# Patient Record
Sex: Male | Born: 1975 | Race: Black or African American | Hispanic: No | Marital: Single | Smoking: Former smoker
Health system: Southern US, Community
[De-identification: ages and names within clinical notes are randomized; demographics above are authoritative.]

## PROBLEM LIST (undated history)

## (undated) DIAGNOSIS — M62838 Other muscle spasm: Secondary | ICD-10-CM

## (undated) DIAGNOSIS — E559 Vitamin D deficiency, unspecified: Secondary | ICD-10-CM

## (undated) DIAGNOSIS — D1723 Benign lipomatous neoplasm of skin and subcutaneous tissue of right leg: Secondary | ICD-10-CM

## (undated) DIAGNOSIS — R000 Tachycardia, unspecified: Secondary | ICD-10-CM

## (undated) DIAGNOSIS — A048 Other specified bacterial intestinal infections: Secondary | ICD-10-CM

## (undated) DIAGNOSIS — D1724 Benign lipomatous neoplasm of skin and subcutaneous tissue of left leg: Secondary | ICD-10-CM

## (undated) DIAGNOSIS — K8020 Calculus of gallbladder without cholecystitis without obstruction: Secondary | ICD-10-CM

## (undated) DIAGNOSIS — D72829 Elevated white blood cell count, unspecified: Secondary | ICD-10-CM

## (undated) DIAGNOSIS — I890 Lymphedema, not elsewhere classified: Secondary | ICD-10-CM

## (undated) DIAGNOSIS — M6282 Rhabdomyolysis: Secondary | ICD-10-CM

## (undated) DIAGNOSIS — K219 Gastro-esophageal reflux disease without esophagitis: Secondary | ICD-10-CM

## (undated) DIAGNOSIS — R002 Palpitations: Secondary | ICD-10-CM

## (undated) DIAGNOSIS — N179 Acute kidney failure, unspecified: Secondary | ICD-10-CM

## (undated) DIAGNOSIS — Z8619 Personal history of other infectious and parasitic diseases: Secondary | ICD-10-CM

## (undated) DIAGNOSIS — G4730 Sleep apnea, unspecified: Secondary | ICD-10-CM

## (undated) DIAGNOSIS — Z95828 Presence of other vascular implants and grafts: Secondary | ICD-10-CM

## (undated) HISTORY — DX: Elevated white blood cell count, unspecified: D72.829

## (undated) HISTORY — DX: Rhabdomyolysis: M62.82

## (undated) HISTORY — DX: Other muscle spasm: M62.838

## (undated) HISTORY — DX: Gastro-esophageal reflux disease without esophagitis: K21.9

## (undated) HISTORY — DX: Lymphedema, not elsewhere classified: I89.0

## (undated) HISTORY — DX: Other specified bacterial intestinal infections: A04.8

## (undated) HISTORY — DX: Acute kidney failure, unspecified: N17.9

## (undated) HISTORY — DX: Benign lipomatous neoplasm of skin and subcutaneous tissue of left leg: D17.24

## (undated) HISTORY — DX: Calculus of gallbladder without cholecystitis without obstruction: K80.20

## (undated) HISTORY — DX: Palpitations: R00.2

## (undated) HISTORY — DX: Tachycardia, unspecified: R00.0

## (undated) HISTORY — DX: Sleep apnea, unspecified: G47.30

## (undated) HISTORY — DX: Vitamin D deficiency, unspecified: E55.9

## (undated) HISTORY — DX: Benign lipomatous neoplasm of skin and subcutaneous tissue of right leg: D17.23

## (undated) HISTORY — DX: Personal history of other infectious and parasitic diseases: Z86.19

## (undated) HISTORY — DX: Presence of other vascular implants and grafts: Z95.828

---

## 1998-12-29 DIAGNOSIS — G4733 Obstructive sleep apnea (adult) (pediatric): Secondary | ICD-10-CM | POA: Insufficient documentation

## 2009-02-19 ENCOUNTER — Emergency Department: Payer: Self-pay

## 2009-05-31 DIAGNOSIS — R002 Palpitations: Secondary | ICD-10-CM

## 2009-05-31 DIAGNOSIS — D1720 Benign lipomatous neoplasm of skin and subcutaneous tissue of unspecified limb: Secondary | ICD-10-CM | POA: Insufficient documentation

## 2009-05-31 DIAGNOSIS — I10 Essential (primary) hypertension: Secondary | ICD-10-CM | POA: Insufficient documentation

## 2009-05-31 DIAGNOSIS — K219 Gastro-esophageal reflux disease without esophagitis: Secondary | ICD-10-CM | POA: Insufficient documentation

## 2009-05-31 HISTORY — DX: Palpitations: R00.2

## 2009-09-19 DIAGNOSIS — R42 Dizziness and giddiness: Secondary | ICD-10-CM | POA: Insufficient documentation

## 2009-09-22 DIAGNOSIS — M2550 Pain in unspecified joint: Secondary | ICD-10-CM | POA: Insufficient documentation

## 2009-09-25 DIAGNOSIS — D509 Iron deficiency anemia, unspecified: Secondary | ICD-10-CM | POA: Insufficient documentation

## 2009-11-06 ENCOUNTER — Ambulatory Visit: Payer: Self-pay | Admitting: Family Medicine

## 2010-12-10 ENCOUNTER — Ambulatory Visit: Payer: Self-pay | Admitting: Family Medicine

## 2012-02-11 ENCOUNTER — Other Ambulatory Visit: Payer: Self-pay | Admitting: Family Medicine

## 2012-02-11 LAB — COMPREHENSIVE METABOLIC PANEL
Albumin: 3.3 g/dL — ABNORMAL LOW (ref 3.4–5.0)
Anion Gap: 9 (ref 7–16)
BUN: 11 mg/dL (ref 7–18)
Bilirubin,Total: 0.5 mg/dL (ref 0.2–1.0)
Chloride: 105 mmol/L (ref 98–107)
Co2: 26 mmol/L (ref 21–32)
EGFR (African American): >60
EGFR (Non-African Amer.): >60
Glucose: 82 mg/dL (ref 65–99)
Osmolality: 278 (ref 275–301)
Potassium: 3.8 mmol/L (ref 3.5–5.1)
SGOT(AST): 17 U/L (ref 15–37)
Sodium: 140 mmol/L (ref 136–145)
Total Protein: 8.7 g/dL — ABNORMAL HIGH (ref 6.4–8.2)

## 2012-02-11 LAB — CBC WITH DIFFERENTIAL/PLATELET
Basophil #: 0 10*3/uL (ref 0.0–0.1)
HCT: 39.3 % — ABNORMAL LOW (ref 40.0–52.0)
Lymphocyte %: 20.5 %
MCH: 26.9 pg (ref 26.0–34.0)
MCV: 83 fL (ref 80–100)
Monocyte #: 0.5 10*3/uL (ref 0.0–0.7)
Monocyte %: 4.8 %
Neutrophil #: 8 10*3/uL — ABNORMAL HIGH (ref 1.4–6.5)
RDW: 16 % — ABNORMAL HIGH (ref 11.5–14.5)
WBC: 10.9 10*3/uL — ABNORMAL HIGH (ref 3.8–10.6)

## 2012-02-11 LAB — LIPID PANEL
Cholesterol: 180 mg/dL (ref 0–200)
Ldl Cholesterol, Calc: 126 mg/dL — ABNORMAL HIGH (ref 0–100)
Triglycerides: 115 mg/dL (ref 0–200)

## 2012-02-11 LAB — PROTIME-INR
INR: 1.1
Prothrombin Time: 14.2 secs (ref 11.5–14.7)

## 2012-02-11 LAB — TSH: Thyroid Stimulating Horm: 3.2 u[IU]/mL

## 2012-02-11 LAB — HEMOGLOBIN A1C: Hemoglobin A1C: 5.8 % (ref 4.2–6.3)

## 2012-02-26 ENCOUNTER — Ambulatory Visit: Payer: Self-pay | Admitting: Family Medicine

## 2012-02-27 ENCOUNTER — Ambulatory Visit: Payer: Self-pay | Admitting: Family Medicine

## 2012-05-23 HISTORY — PX: GASTRIC BYPASS: SHX52

## 2013-01-03 IMAGING — US ABDOMEN ULTRASOUND
1 series · 17 of 25 positions shown · non-contrast
Comparison: none

REASON FOR EXAM: morbid obesity obstructive sleep apnea
COMMENTS:

[Series 1: abdomen ultrasound · 17 of 79 slices shown]
[im 1/79]
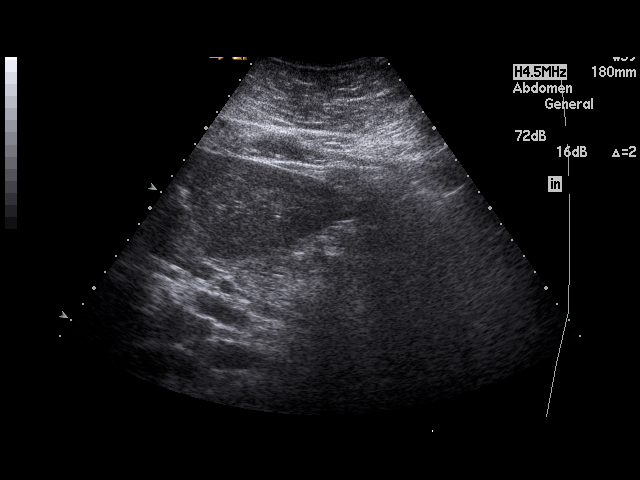
[im 7/79]
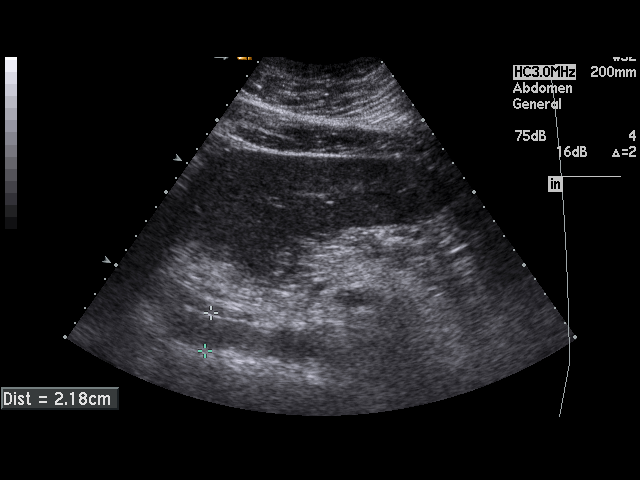
[im 10/79]
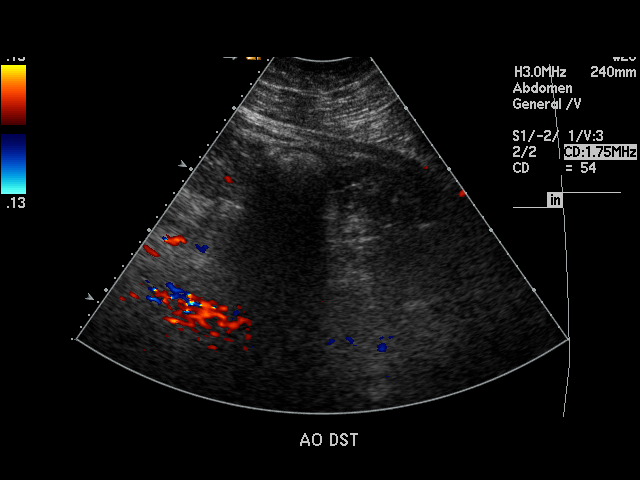
[im 17/79]
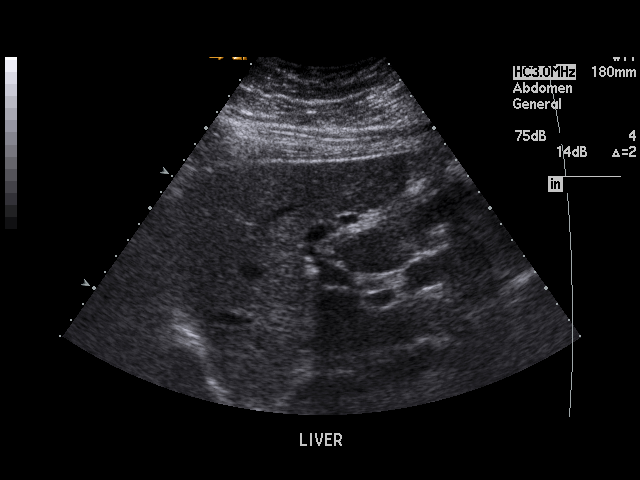
[im 20/79]
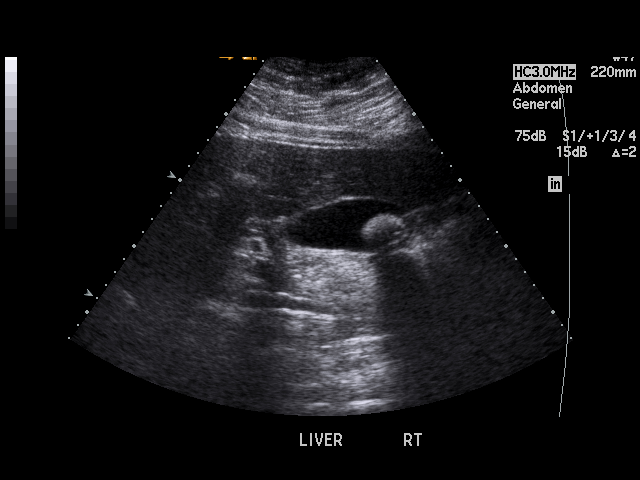
[im 27/79]
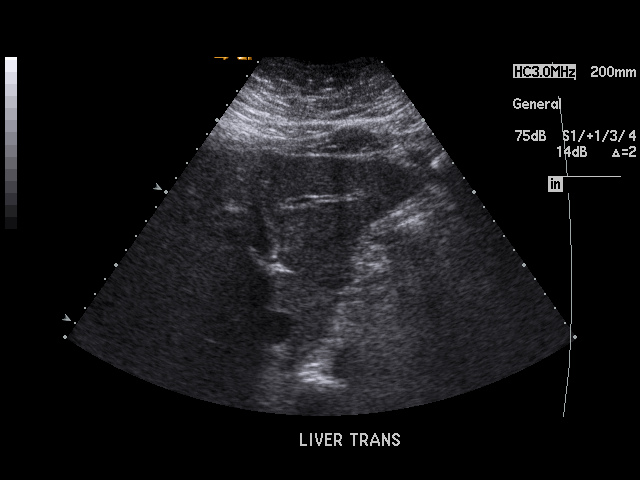
[im 30/79]
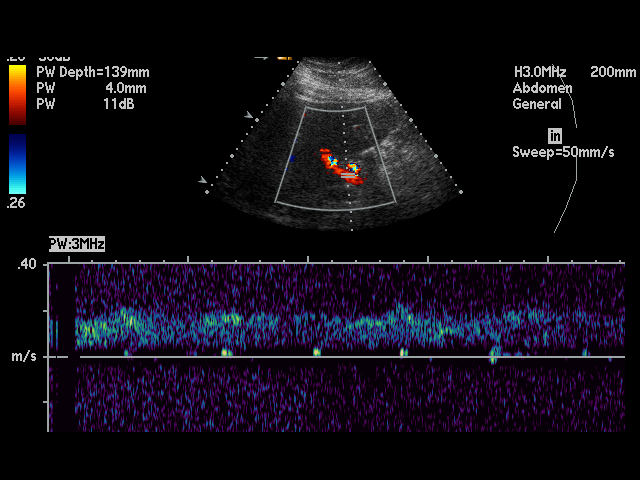
[im 36/79]
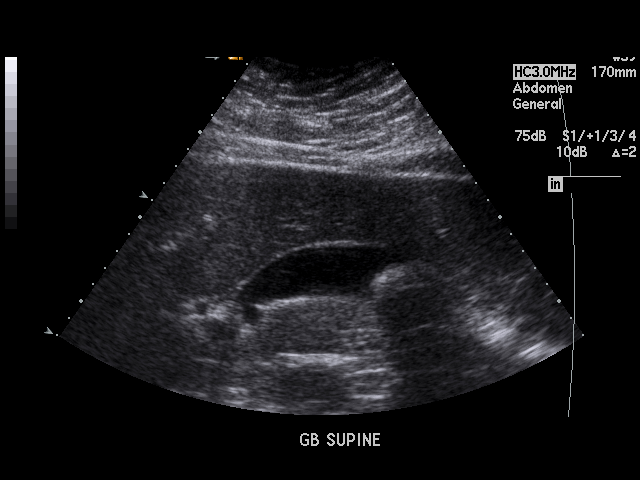
[im 40/79]
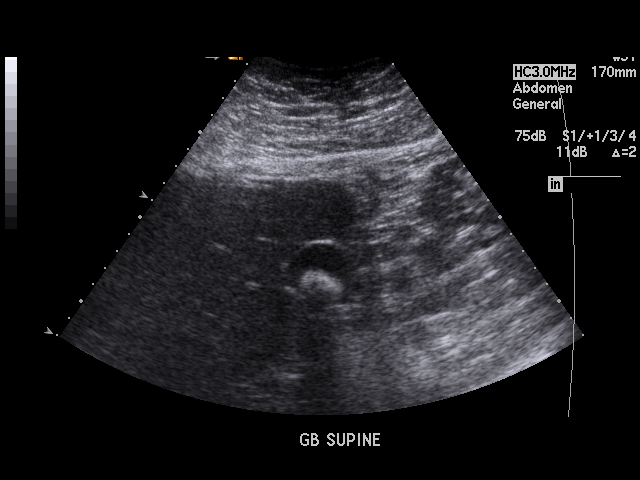
[im 43/79]
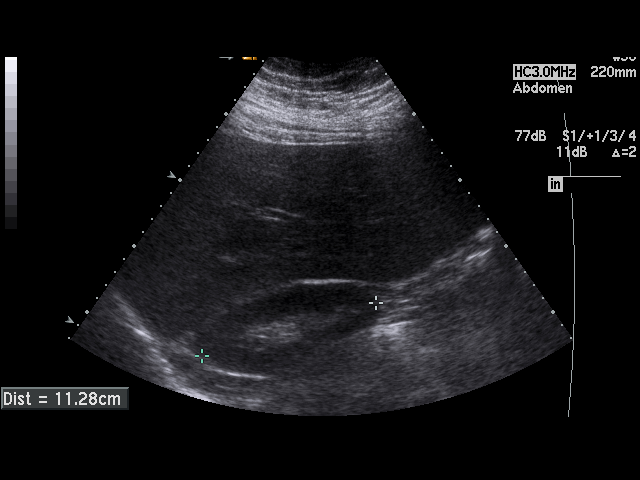
[im 49/79]
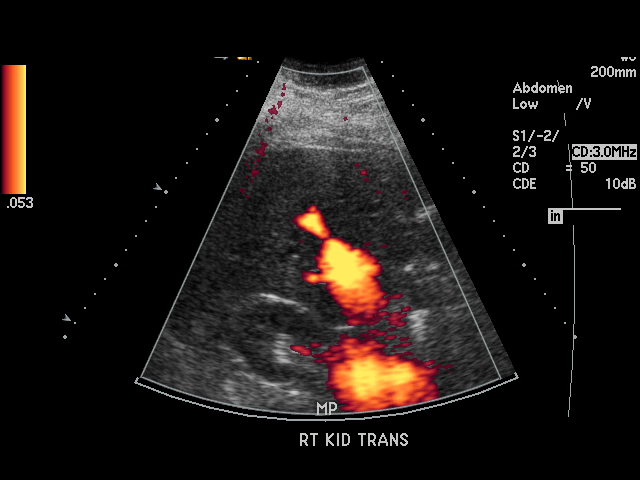
[im 53/79]
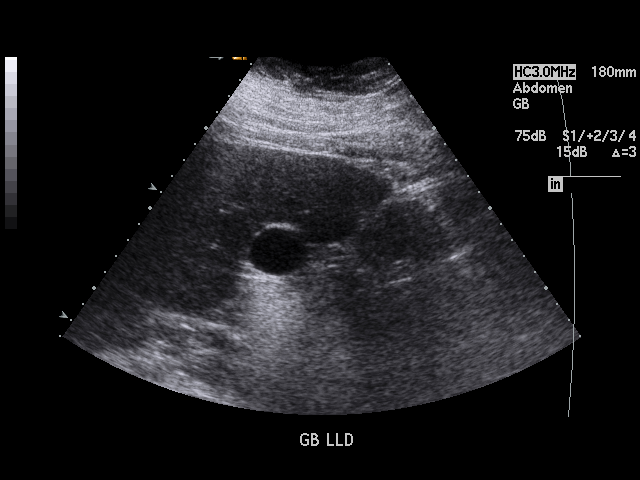
[im 59/79]
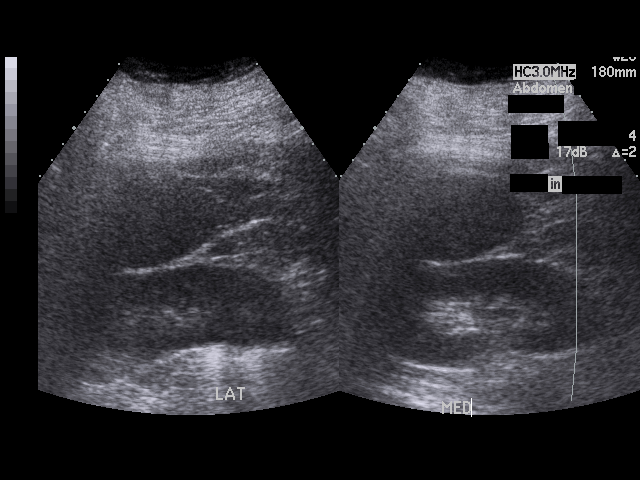
[im 62/79]
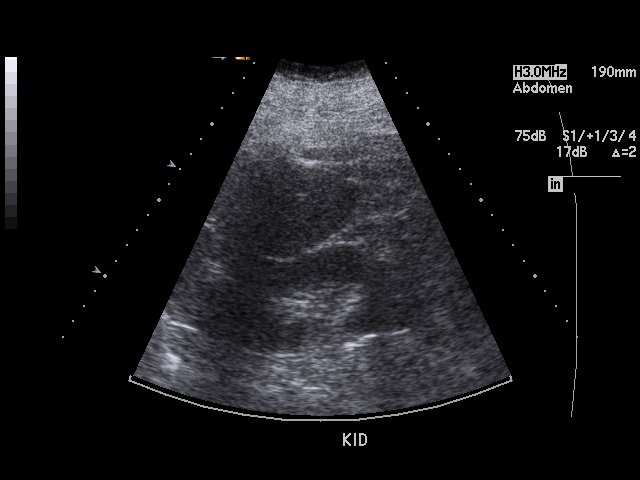
[im 69/79]
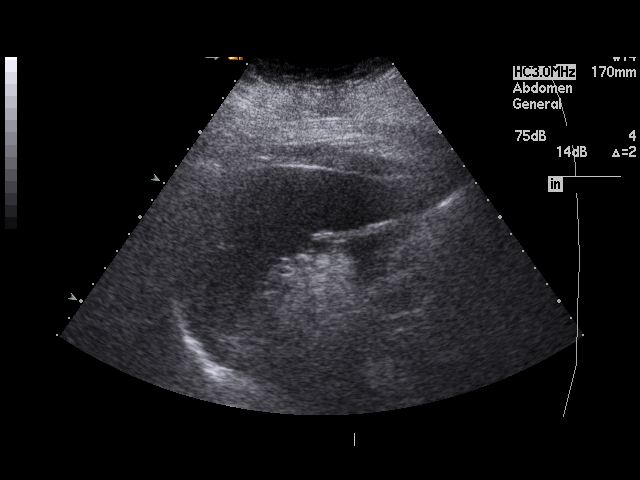
[im 72/79]
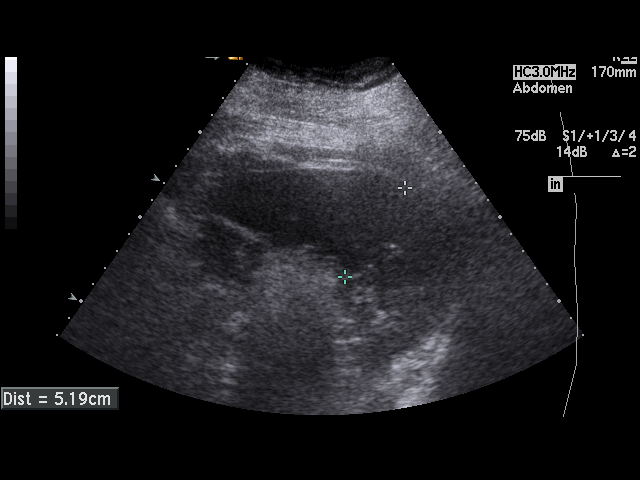
[im 79/79]
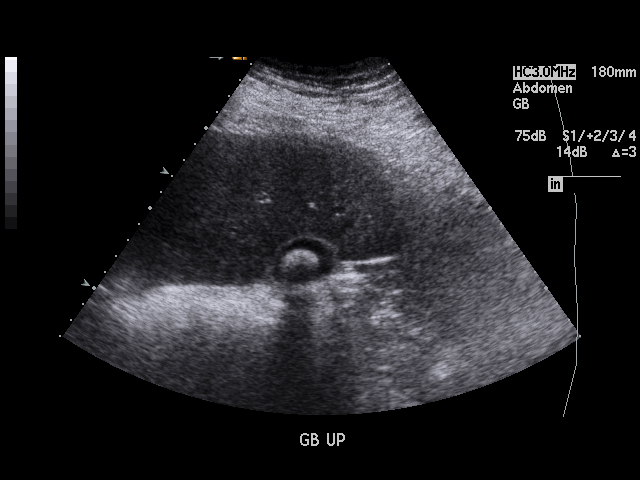

[17 of 25 positions shown; findings below may reference images not displayed]

PROCEDURE:     US  - US ABDOMEN GENERAL SURVEY  - February 26, 2012  [DATE]

RESULT:     The liver, spleen, abdominal aorta and inferior vena cava show
no significant abnormalities. Note is made that the distal portions of both
the aorta and inferior vena cava are obscured by bowel gas. The pancreas is
not visualized adequately for evaluation in this exam. There is observed a
2.11 cm stone within the gallbladder. There is no thickening of the
gallbladder wall which measures 2.4 mm in thickness. Common bile duct
measures 3.2 mm in diameter which is within normal limits. The kidneys show
no hydronephrosis. Sagittally, the right kidney measured 11.28 cm and the
left measures 11.71 cm. No ascites is seen.
IMPRESSION: Cholelithiasis. There is observed no associated thickening of the
gallbladder wall and no pericholecystic fluid is seen.

## 2013-01-03 IMAGING — CR DG CHEST 2V
1 series · 2 of 2 positions shown · non-contrast
Comparison: none

REASON FOR EXAM: pre-op exam for bariatric surgery
COMMENTS:

PROCEDURE:     DXR - DXR CHEST PA (OR AP) AND LATERAL  - February 26, 2012  [DATE]
RESULT:     The lung fields are clear. No pneumonia, pneumothorax or pleural
effusion is seen. The heart is upper limits for normal in size.

[Series 1: w chest pa · 0.14mm/px · 2 of 2 slices shown]
[im 1/2]
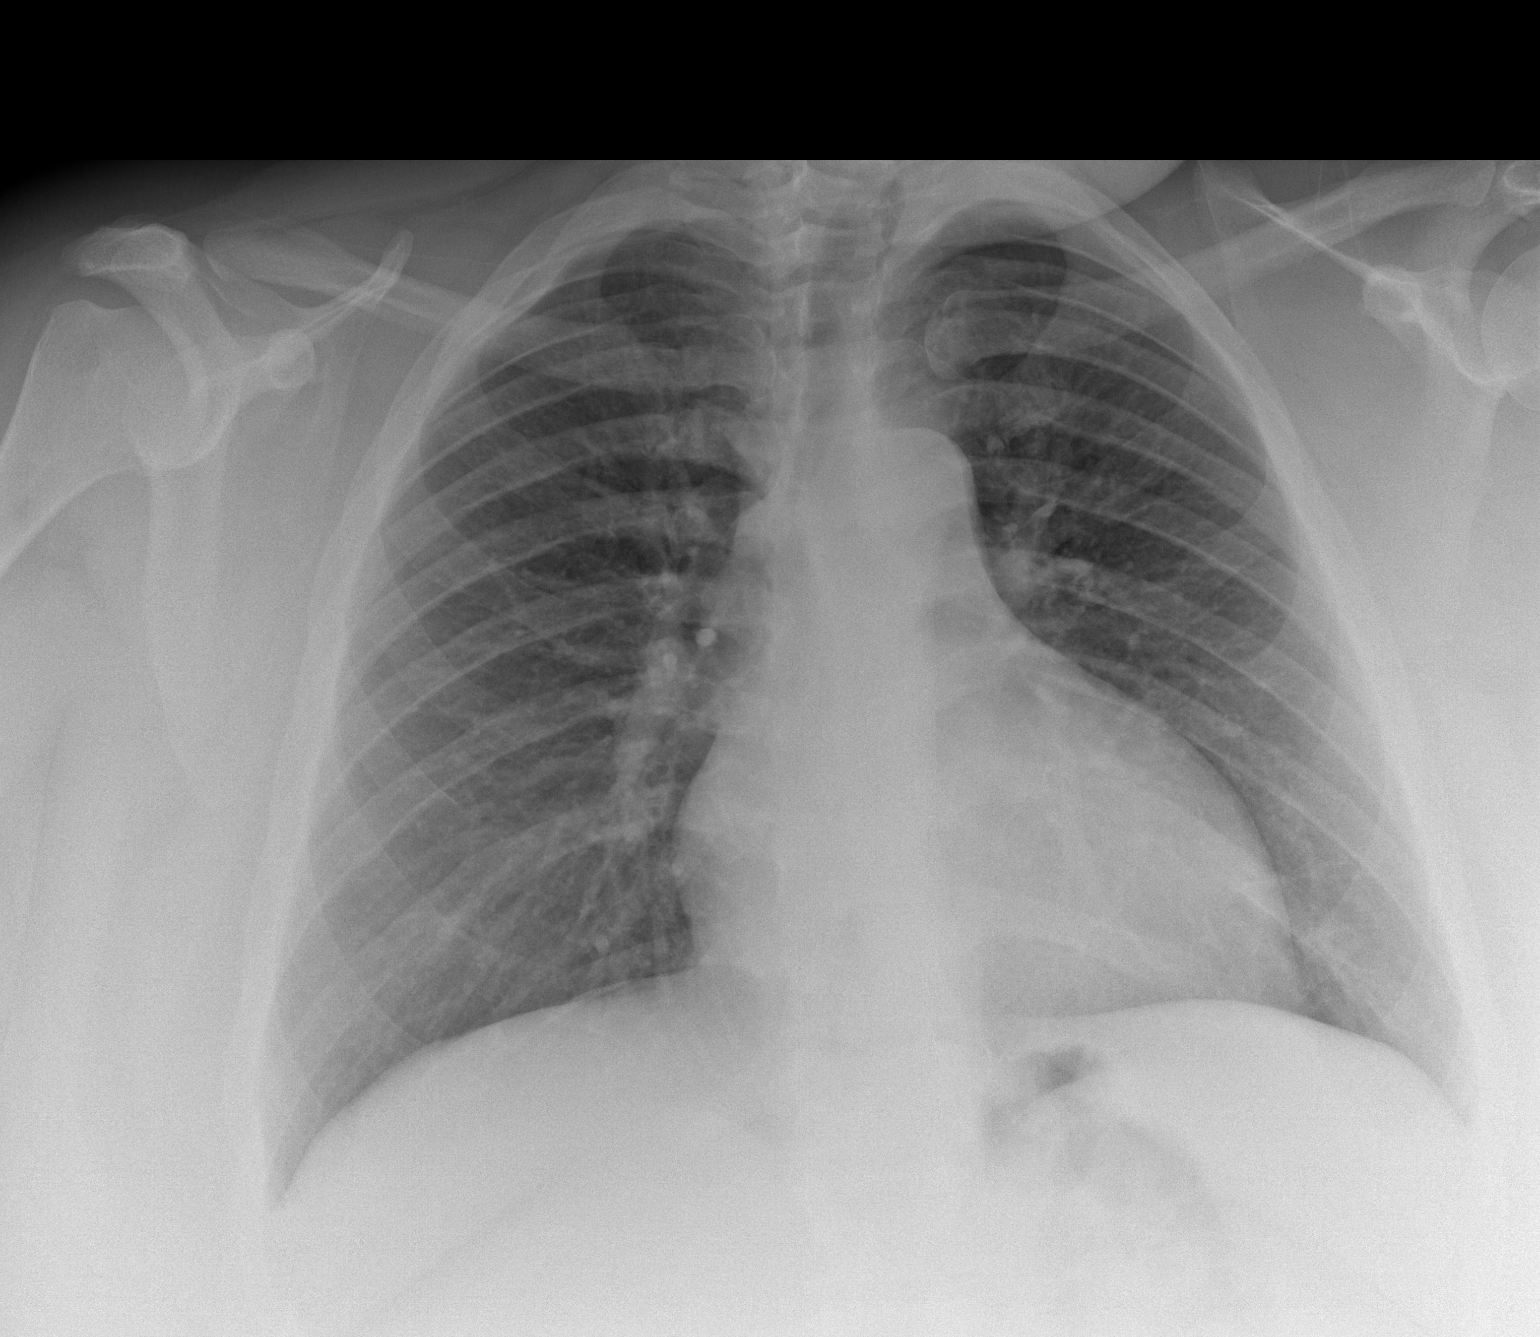
[im 2/2]
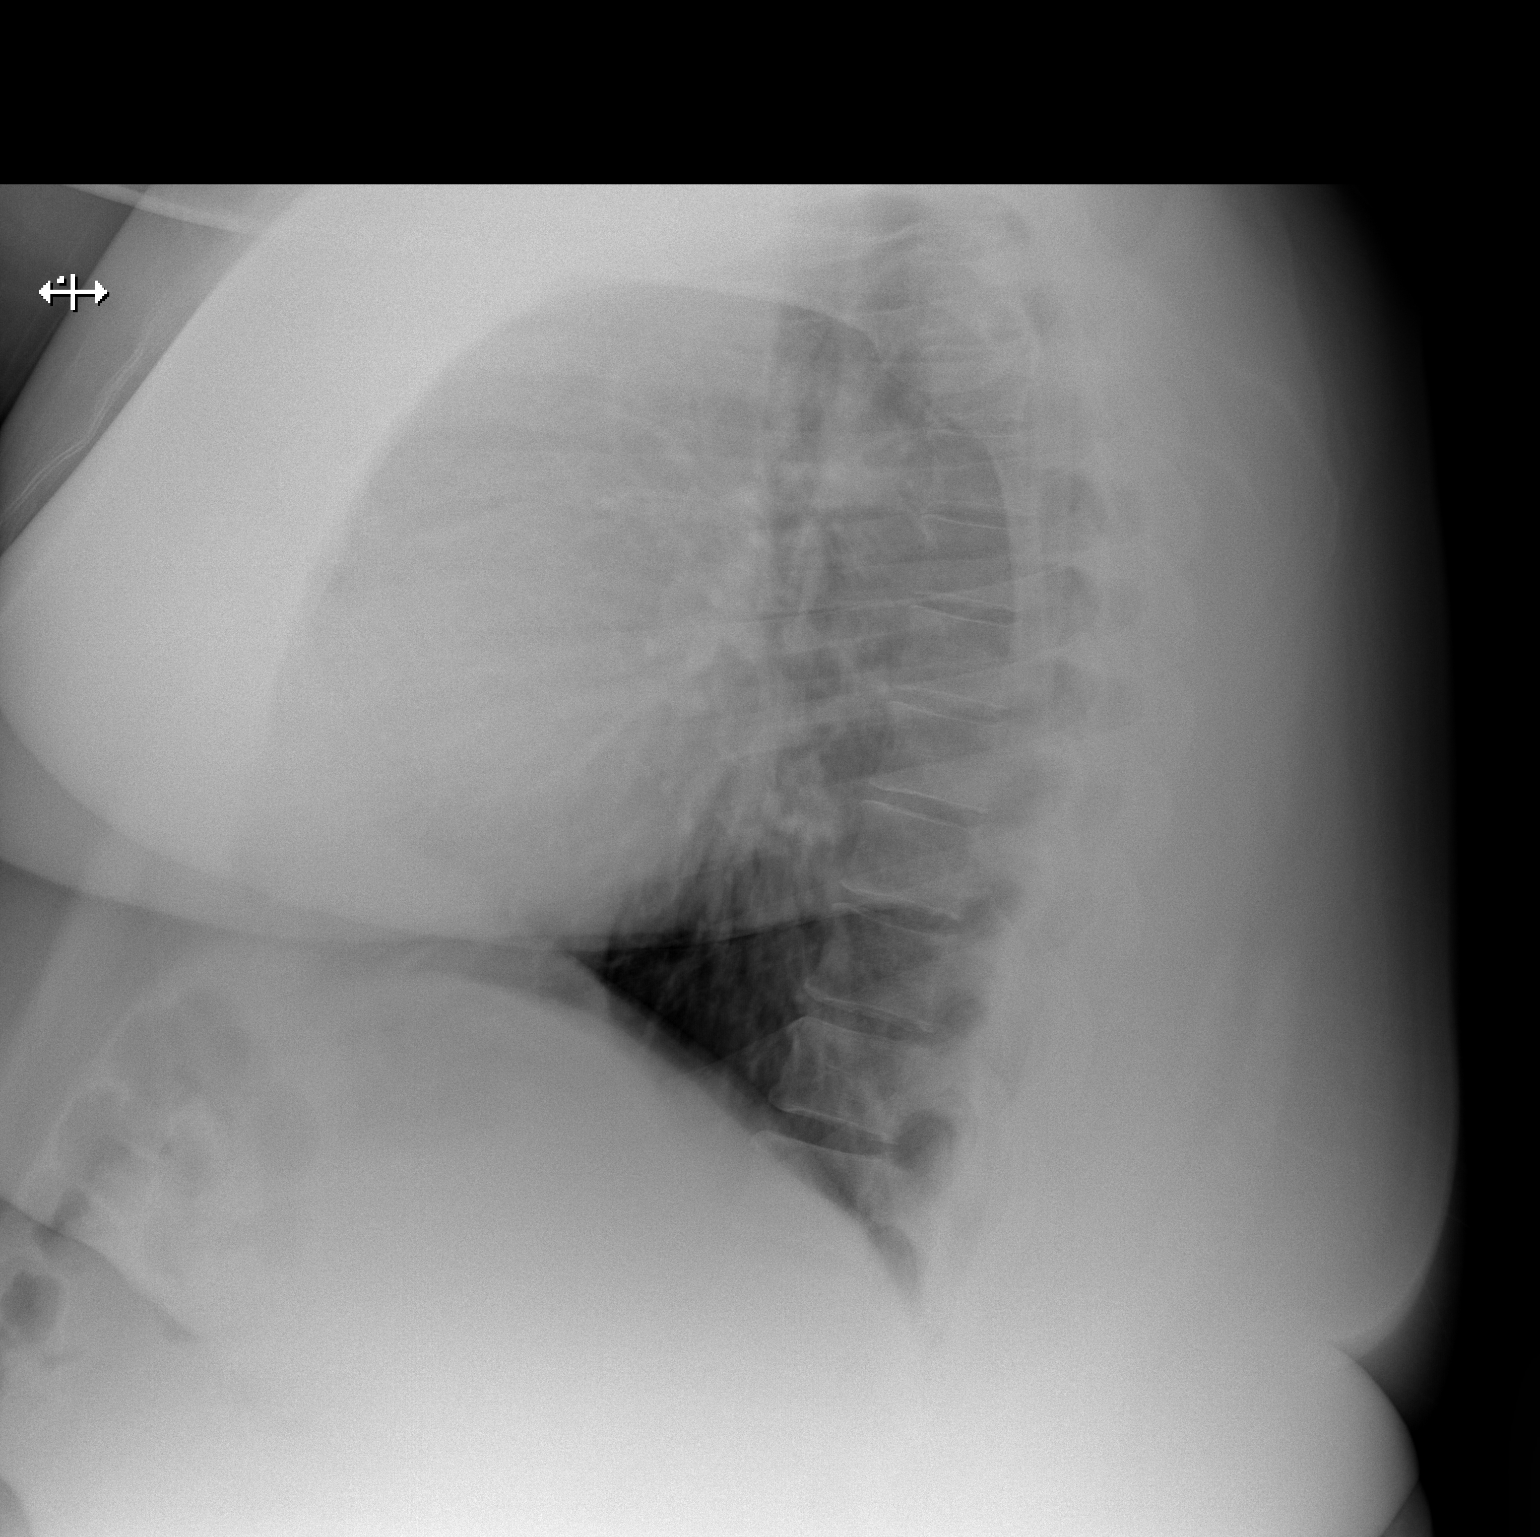

[2 of 2 positions shown; findings below may reference images not displayed]

IMPRESSION: No acute changes are identified.

## 2015-09-07 ENCOUNTER — Ambulatory Visit: Payer: Self-pay | Admitting: Family Medicine

## 2015-09-10 ENCOUNTER — Encounter: Payer: Self-pay | Admitting: Family Medicine

## 2015-09-10 ENCOUNTER — Ambulatory Visit (INDEPENDENT_AMBULATORY_CARE_PROVIDER_SITE_OTHER): Payer: BLUE CROSS/BLUE SHIELD | Admitting: Family Medicine

## 2015-09-10 VITALS — BP 120/78 | HR 90 | Temp 97.8°F | Resp 18

## 2015-09-10 DIAGNOSIS — F909 Attention-deficit hyperactivity disorder, unspecified type: Secondary | ICD-10-CM | POA: Diagnosis not present

## 2015-09-10 DIAGNOSIS — A048 Other specified bacterial intestinal infections: Secondary | ICD-10-CM | POA: Insufficient documentation

## 2015-09-10 DIAGNOSIS — M25569 Pain in unspecified knee: Secondary | ICD-10-CM | POA: Insufficient documentation

## 2015-09-10 DIAGNOSIS — R609 Edema, unspecified: Secondary | ICD-10-CM

## 2015-09-10 DIAGNOSIS — F419 Anxiety disorder, unspecified: Secondary | ICD-10-CM | POA: Insufficient documentation

## 2015-09-10 DIAGNOSIS — G629 Polyneuropathy, unspecified: Secondary | ICD-10-CM | POA: Diagnosis not present

## 2015-09-10 DIAGNOSIS — F988 Other specified behavioral and emotional disorders with onset usually occurring in childhood and adolescence: Secondary | ICD-10-CM

## 2015-09-10 DIAGNOSIS — E559 Vitamin D deficiency, unspecified: Secondary | ICD-10-CM | POA: Insufficient documentation

## 2015-09-10 DIAGNOSIS — R1030 Lower abdominal pain, unspecified: Secondary | ICD-10-CM | POA: Insufficient documentation

## 2015-09-10 DIAGNOSIS — T148XXA Other injury of unspecified body region, initial encounter: Secondary | ICD-10-CM | POA: Insufficient documentation

## 2015-09-10 DIAGNOSIS — E668 Other obesity: Secondary | ICD-10-CM

## 2015-09-10 HISTORY — DX: Other specified bacterial intestinal infections: A04.8

## 2015-09-10 MED ORDER — AMPHETAMINE-DEXTROAMPHET ER 5 MG PO CP24: 10.0000 mg | ORAL_CAPSULE | Freq: Every day | ORAL | Status: DC

## 2015-09-10 MED ORDER — AMPHETAMINE-DEXTROAMPHET ER 5 MG PO CP24: 5.0000 mg | ORAL_CAPSULE | Freq: Every day | ORAL | Status: DC

## 2015-09-10 NOTE — Progress Notes (Signed)
Patient: Christopher Herman Male    DOB: Dec 15, 1976   39 y.o.   MRN: [id number] Visit Date: 09/10/2015  Today's Provider: Lelon Huh, MD   Chief Complaint  Patient presents with  . Numbness    in left foot x 2-3 months   Subjective:    HPI Numbness in left foot: Patient comes in stating that for the past 2-3 months, he has had numbness in his left foot. Numbness goes from anterior lower leg into top of foot. Numbness has worsened. Patient states he also has swelling in his lipoma.   Edema Follow up for chronic bilateral lower extremity edema. He states he does not take furosemide on work days because he can't take that many bathroom breaks and has trouble making to the restroom when at work. When he does take furosemide he states he voids 3-5 liters.   Attention problems He states over the last few years he has  Had increasing difficulties focusing and paying attention to his work. He has trouble completing assignments and is easily distracted. He has had no changes in his mood, does no feels depressed or unusually anxious. No changes in sleep patterns.     No Known Allergies Previous Medications   FUROSEMIDE (LASIX) 20 MG TABLET    Take 1-2 tablets by mouth daily as needed.   IBUPROFEN (ADVIL,MOTRIN) 400 MG TABLET    Take 1-2 tablets by mouth every 8 (eight) hours as needed.   METHOCARBAMOL (ROBAXIN) 750 MG TABLET    Take 1 tablet by mouth every 4 (four) hours.   PHENTERMINE (ADIPEX-P) 37.5 MG TABLET    Take 1 tablet by mouth daily. At 10 am   TRAMADOL (ULTRAM) 50 MG TABLET    Take 1 tablet by mouth every 8 (eight) hours as needed. 1-2    Review of Systems  Constitutional: Negative for fever, chills and appetite change.  Respiratory: Negative for chest tightness, shortness of breath and wheezing.   Cardiovascular: Negative for chest pain and palpitations.  Gastrointestinal: Negative for nausea, vomiting and abdominal pain.  Musculoskeletal: Positive for joint swelling  and arthralgias.    Social History  Substance Use Topics  . Smoking status: Former Research scientist (life sciences)  . Smokeless tobacco: Never Used     Comment: Quit smoking in 2010, smoked cigarettes, smoked for about 16 years; smoked less than 1/2 pack per day.  . Alcohol Use: No   Objective:   BP 120/78 mmHg  Pulse 90  Temp(Src) 97.8 F (36.6 C) (Oral)  Resp 18  SpO2 98%  Physical Exam  General Appearance:    Alert, cooperative, no distress, morbidly obese  Eyes:    PERRL, conjunctiva/corneas clear, EOM's intact       Lungs:     Clear to auscultation bilaterally, respirations unlabored  Heart:    Regular rate and rhythm  Neurologic:   Awake, alert, oriented x 3. No apparent focal neurological           defect.   Ext:   Extremely large lipomas across posterior aspect of both lower legs. 3+ bilateral pitting edema both lower legs.        Assessment & Plan:     1. ADD (attention deficit disorder)  - amphetamine-dextroamphetamine (ADDERALL XR) 5 MG 24 hr capsule; Take 1 capsule (5 mg total) by mouth daily.  Dispense: 30 capsule; Refill: 0 Consider increase to 10mg  if tolerating well.   2. Edema Chronic, secondary to obesity. Continue prn furosemide.  3. Morbid obeisty Counseled that he is not take phentermine while he is taking Adderall. However sometimes weight loss is seen with Adderall.   4. 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 Recommend he follow up with his surgeon to see if any nerve decompression can be done.   Reviewed and comleted FMLA formes today.   Addressed extensive list of chronic and acute medical problems today requiring extensive time in counseling and coordination care.  Over half of this 45 minute visit were spent in counseling and coordinating care of multiple medical problems.       Lelon Huh, MD  [facility]

## 2015-09-11 ENCOUNTER — Encounter: Payer: Self-pay | Admitting: Family Medicine

## 2015-09-11 DIAGNOSIS — K8020 Calculus of gallbladder without cholecystitis without obstruction: Secondary | ICD-10-CM | POA: Insufficient documentation

## 2015-09-11 DIAGNOSIS — Z95828 Presence of other vascular implants and grafts: Secondary | ICD-10-CM | POA: Insufficient documentation

## 2015-10-05 ENCOUNTER — Other Ambulatory Visit: Payer: Self-pay | Admitting: Family Medicine

## 2015-10-26 ENCOUNTER — Other Ambulatory Visit: Payer: Self-pay | Admitting: Family Medicine

## 2015-11-20 ENCOUNTER — Telehealth: Payer: Self-pay | Admitting: Family Medicine

## 2015-11-20 NOTE — Telephone Encounter (Signed)
Pt needs a note stating he is late for work or from breaks due to the swelling and pain in his legs.   He is already on FMLA.  But needs this included.   Pt call back is [phone]  Thanks Con Memos

## 2015-11-27 NOTE — Telephone Encounter (Signed)
Please advise 

## 2015-11-27 NOTE — Telephone Encounter (Signed)
Pt called to see if the note is ready to be picked up.  [id number]

## 2015-12-03 NOTE — Telephone Encounter (Signed)
Pt called about the letter and stated that it needs to be like the one that Dr. Caryn Section wrote her last year. Thanks TNP

## 2016-01-28 ENCOUNTER — Ambulatory Visit (INDEPENDENT_AMBULATORY_CARE_PROVIDER_SITE_OTHER): Payer: BLUE CROSS/BLUE SHIELD | Admitting: Family Medicine

## 2016-01-28 ENCOUNTER — Encounter: Payer: Self-pay | Admitting: Family Medicine

## 2016-01-28 VITALS — BP 118/82 | HR 72 | Temp 99.4°F | Resp 16

## 2016-01-28 DIAGNOSIS — D1720 Benign lipomatous neoplasm of skin and subcutaneous tissue of unspecified limb: Secondary | ICD-10-CM

## 2016-01-28 DIAGNOSIS — G4733 Obstructive sleep apnea (adult) (pediatric): Secondary | ICD-10-CM | POA: Diagnosis not present

## 2016-01-28 MED ORDER — FUROSEMIDE 40 MG PO TABS: 40.0000 mg | ORAL_TABLET | Freq: Every day | ORAL | Status: DC | PRN

## 2016-01-28 MED ORDER — MELOXICAM 15 MG PO TABS: 15.0000 mg | ORAL_TABLET | Freq: Every day | ORAL | Status: DC

## 2016-01-28 NOTE — Progress Notes (Signed)
Patient: Christopher Herman Male    DOB: [birth year]   40 y.o.   MRN: [id number] Visit Date: 01/28/2016  Today's Provider: Lelon Huh, MD   Chief Complaint  Patient presents with  . Peripheral Neuropathy  . Edema  . ADD   Subjective:    HPI  Follow up Neuropathy:  Last office visit was 09/10/2015. Management at that visit includes advising patient to follow up with surgeon to see if any nerve decompression could be done. Patient states he has not seen any surgeon and has not been referred to a surgeon. Patient needs his FMLA forms adjusted today.    Edema:  Last office visit was 4 months ago. No changes were made. Patient was to continue taking Furosemide as needed. Patient comes in today stating the swelling has worsened. Sometimes the swelling in his legs is so severe that he is unable to reach the gas pedals in his car. Patient states last week he urinated a gallon and a half.   Follow up ADD:  Last office visit was 4 months ago and patient was started on Adderall XR 5mg  daily. Since last visit patient states he took the Adderall once and stopped due to side effects. He reports that the Adderall made him feel depressed.     No Known Allergies Previous Medications   AMPHETAMINE-DEXTROAMPHETAMINE (ADDERALL XR) 5 MG 24 HR CAPSULE    Take 1 capsule (5 mg total) by mouth daily.   FUROSEMIDE (LASIX) 20 MG TABLET    Take 1-2 tablets by mouth daily as needed.   IBUPROFEN (ADVIL,MOTRIN) 400 MG TABLET    take 1 to 2 tablets by mouth every 8 hours if needed   METHOCARBAMOL (ROBAXIN) 750 MG TABLET    Take 1 tablet by mouth every 4 (four) hours.   TRAMADOL (ULTRAM) 50 MG TABLET    Take 1 tablet by mouth every 8 (eight) hours as needed. 1-2   VITAMIN D, ERGOCALCIFEROL, (DRISDOL) 50000 UNITS CAPS CAPSULE    take 1 capsule by mouth every week    Review of Systems  Constitutional: Negative for fever, chills and appetite change.  Respiratory: Negative for chest tightness, shortness of  breath and wheezing.   Cardiovascular: Positive for leg swelling. Negative for chest pain and palpitations.  Gastrointestinal: Negative for nausea, vomiting and abdominal pain.  Musculoskeletal: Positive for myalgias.    Social History  Substance Use Topics  . Smoking status: Former Research scientist (life sciences)  . Smokeless tobacco: Never Used     Comment: Quit smoking in 2010, smoked cigarettes, smoked for about 16 years; smoked less than 1/2 pack per day.  . Alcohol Use: No   Objective:   BP 118/82 mmHg  Pulse 72  Temp(Src) 99.4 F (37.4 C) (Oral)  Resp 16  SpO2 98%  Physical Exam  General appearance: alert, well developed, well nourished, cooperative and in no distress, morbidly obese with large lipomas on backs of both lower legs and  3+ non-pitting edema. Head: Normocephalic, without obvious abnormality, atraumatic Lungs: Respirations even and unlabored Extremities: No gross deformities Skin: Skin color, texture, turgor normal. No rashes seen  Psych: Appropriate mood and affect. Neurologic: Mental status: Alert, oriented to person, place, and time, thought content appropriate.     Assessment & Plan:     1. Lipoma of lower extremity   2. Morbid obesity, unspecified obesity type ([place])   3. Obstructive sleep apnea  Spent 30 minutes with patient counseling and formulating proper amount of  leave for FMLA. He often ends up being late for work and having to leave early due to severe swelling and having to take diuretics. Occasionally runs late due to worsening dyspnea requiring him to slow down while getting ready for work.  Completed FMLA forma with patient in room today.        Lelon Huh, MD  [facility]

## 2016-04-08 DIAGNOSIS — Z9884 Bariatric surgery status: Secondary | ICD-10-CM | POA: Diagnosis not present

## 2016-04-22 DIAGNOSIS — Z9884 Bariatric surgery status: Secondary | ICD-10-CM | POA: Diagnosis not present

## 2016-05-16 DIAGNOSIS — Z9884 Bariatric surgery status: Secondary | ICD-10-CM | POA: Diagnosis not present

## 2016-06-05 ENCOUNTER — Other Ambulatory Visit: Payer: Self-pay | Admitting: Family Medicine

## 2016-07-15 ENCOUNTER — Other Ambulatory Visit: Payer: Self-pay | Admitting: Family Medicine

## 2016-09-30 ENCOUNTER — Ambulatory Visit (INDEPENDENT_AMBULATORY_CARE_PROVIDER_SITE_OTHER): Payer: BLUE CROSS/BLUE SHIELD | Admitting: Family Medicine

## 2016-09-30 ENCOUNTER — Encounter: Payer: Self-pay | Admitting: Family Medicine

## 2016-09-30 DIAGNOSIS — R2243 Localized swelling, mass and lump, lower limb, bilateral: Secondary | ICD-10-CM | POA: Diagnosis not present

## 2016-09-30 DIAGNOSIS — R609 Edema, unspecified: Secondary | ICD-10-CM | POA: Diagnosis not present

## 2016-09-30 NOTE — Progress Notes (Signed)
       Patient: Christopher Herman Male    DOB: 1976/04/24   40 y.o.   MRN: [id number] Visit Date: 09/30/2016  Today's Provider: Lelon Huh, MD   No chief complaint on file.  Subjective:    HPI  Patient is here to have his FMLA papers done. He has very large masses in both lower legs determined be lipomas on past ultrasound. Has had evaluation by generally surgery but declined resection was declined due to massive obesity of patient. He frequently is unable to get himself up, dressed and prepared for work on time due to severe pains and dyspnea on exertion.   He states he has starting working with [employer], Caryl Bis for weight loss.   No Known Allergies   Current Outpatient Prescriptions:  .  amphetamine-dextroamphetamine (ADDERALL XR) 5 MG 24 hr capsule, Take 1 capsule (5 mg total) by mouth daily., Disp: 30 capsule, Rfl: 0 .  cephALEXin (KEFLEX) 500 MG capsule, take 1 capsule by mouth four times a day, Disp: 56 capsule, Rfl: 3 .  furosemide (LASIX) 40 MG tablet, Take 1-2 tablets (40-80 mg total) by mouth daily as needed., Disp: 30 tablet, Rfl: 1 .  meloxicam (MOBIC) 15 MG tablet, take 1 tablet by mouth daily with food, Disp: 30 tablet, Rfl: 3 .  traMADol (ULTRAM) 50 MG tablet, Take 1 tablet by mouth every 8 (eight) hours as needed. 1-2, Disp: , Rfl:  .  Vitamin D, Ergocalciferol, (DRISDOL) 50000 UNITS CAPS capsule, take 1 capsule by mouth every week, Disp: 12 capsule, Rfl: 3  Review of Systems  Social History  Substance Use Topics  . Smoking status: Former Research scientist (life sciences)  . Smokeless tobacco: Never Used     Comment: Quit smoking in 2010, smoked cigarettes, smoked for about 16 years; smoked less than 1/2 pack per day.  . Alcohol use No   Objective:   BP 110/70 (BP Location: Left Arm, Patient Position: Sitting, Cuff Size: Large)   Pulse 82   Temp 98.5 F (36.9 C) (Oral)   Resp 16   SpO2 96%   Physical Exam  General Appearance:    Alert, cooperative, no distress, morbidly  obese  Eyes:    PERRL, conjunctiva/corneas clear, EOM's intact       Lungs:     Clear to auscultation bilaterally, respirations unlabored  Heart:    Regular rate and rhythm  Neurologic:   Awake, alert, oriented x 3. No apparent focal neurological           defect.   Ext:   Large masses across both posterior lower legs, 3+ bilateral pitting edema both lower legs.       Assessment & Plan:      1. Morbid obesity, unspecified obesity type ([place])   2. Edema, unspecified type   3. Lipoma of lower extremity, unspecified laterality Reviewed patient's history and work limitations. He is concerned that masses in legs may turn cancerous. Will obtain ultrasound of both lower legs.   Completed FMLA forns stating he may be up to 3 hours late up to 5 days in a week.   Over half of this 30 minute visit were spent counseling and coordination of care.   Wants referral to PM&R (pain management in [place] He will call back back for referral       Lelon Huh, MD  [place]

## 2016-10-09 ENCOUNTER — Ambulatory Visit: Payer: BLUE CROSS/BLUE SHIELD

## 2016-10-13 ENCOUNTER — Telehealth: Payer: Self-pay | Admitting: Family Medicine

## 2016-10-13 NOTE — Telephone Encounter (Signed)
Left message notifying pt forms are being faxed.

## 2016-10-13 NOTE — Telephone Encounter (Signed)
Please advise 

## 2016-10-13 NOTE — Telephone Encounter (Signed)
They are done and in medical records. They will be faxed today.

## 2016-10-13 NOTE — Telephone Encounter (Signed)
Pt is calling to see if his FMLA forms are ready.  Pt states these have to be completed and turned in today.  Pt is requesting these can be faxed to the fax # listed on the paper work Attn [employer].  [phone]

## 2016-10-16 ENCOUNTER — Encounter: Payer: Self-pay | Admitting: Family Medicine

## 2016-11-16 ENCOUNTER — Other Ambulatory Visit: Payer: Self-pay | Admitting: Family Medicine

## 2017-02-17 ENCOUNTER — Other Ambulatory Visit: Payer: Self-pay | Admitting: Family Medicine

## 2017-04-07 ENCOUNTER — Telehealth: Payer: Self-pay | Admitting: Family Medicine

## 2017-04-07 NOTE — Telephone Encounter (Signed)
Please review

## 2017-04-07 NOTE — Telephone Encounter (Signed)
Pt called saying she needs a letter stating she cant work more than 5 days in a row dew to swelling in her legs.  Pt call back is [phone]  Thanks Con Memos

## 2017-04-13 ENCOUNTER — Other Ambulatory Visit: Payer: Self-pay | Admitting: Family Medicine

## 2017-04-13 NOTE — Telephone Encounter (Signed)
Pt is called to see if the letter is ready to be picked up.  AC#[phone]/[id number]

## 2017-05-11 ENCOUNTER — Telehealth: Payer: Self-pay | Admitting: Family Medicine

## 2017-05-11 NOTE — Telephone Encounter (Signed)
ERROR/mw

## 2017-05-18 ENCOUNTER — Ambulatory Visit: Payer: BLUE CROSS/BLUE SHIELD | Admitting: Family Medicine

## 2017-07-03 ENCOUNTER — Telehealth: Payer: Self-pay

## 2017-07-03 NOTE — Telephone Encounter (Signed)
Patient called checking on his paper work he dropped off -ADA paper he said he needs to have information put on there like FMLA forms had on there, starting with 02/12/17 notes. Please review. Thank you-aa

## 2017-07-13 ENCOUNTER — Telehealth: Payer: Self-pay

## 2017-07-13 NOTE — Telephone Encounter (Signed)
Called patient in regards to FMLA form that needed to be filled out. Fax is on Jahvon Gosline's desk. Thanks!

## 2017-07-13 NOTE — Telephone Encounter (Signed)
Pt is returning call.  PB#[phone]

## 2017-07-14 NOTE — Telephone Encounter (Signed)
Spoke with the patient in regards to his paperwork. Form was filled out and placed up front for pick up.

## 2017-07-24 ENCOUNTER — Other Ambulatory Visit: Payer: Self-pay | Admitting: Family Medicine

## 2017-08-29 ENCOUNTER — Other Ambulatory Visit: Payer: Self-pay | Admitting: Family Medicine

## 2017-09-21 ENCOUNTER — Ambulatory Visit (INDEPENDENT_AMBULATORY_CARE_PROVIDER_SITE_OTHER): Payer: BLUE CROSS/BLUE SHIELD | Admitting: Family Medicine

## 2017-09-21 ENCOUNTER — Encounter: Payer: Self-pay | Admitting: Family Medicine

## 2017-09-21 VITALS — BP 124/82 | HR 72 | Temp 98.3°F | Resp 16

## 2017-09-21 DIAGNOSIS — D1720 Benign lipomatous neoplasm of skin and subcutaneous tissue of unspecified limb: Secondary | ICD-10-CM | POA: Diagnosis not present

## 2017-09-21 DIAGNOSIS — R609 Edema, unspecified: Secondary | ICD-10-CM

## 2017-09-21 DIAGNOSIS — I10 Essential (primary) hypertension: Secondary | ICD-10-CM | POA: Diagnosis not present

## 2017-09-21 DIAGNOSIS — F988 Other specified behavioral and emotional disorders with onset usually occurring in childhood and adolescence: Secondary | ICD-10-CM

## 2017-09-21 DIAGNOSIS — D509 Iron deficiency anemia, unspecified: Secondary | ICD-10-CM | POA: Diagnosis not present

## 2017-09-21 DIAGNOSIS — Z1331 Encounter for screening for depression: Secondary | ICD-10-CM

## 2017-09-21 DIAGNOSIS — Z1389 Encounter for screening for other disorder: Secondary | ICD-10-CM

## 2017-09-21 DIAGNOSIS — R252 Cramp and spasm: Secondary | ICD-10-CM | POA: Diagnosis not present

## 2017-09-21 DIAGNOSIS — R002 Palpitations: Secondary | ICD-10-CM

## 2017-09-21 MED ORDER — CYCLOBENZAPRINE HCL 5 MG PO TABS: 5.0000 mg | ORAL_TABLET | Freq: Three times a day (TID) | ORAL | 1 refills | Status: DC | PRN

## 2017-09-21 MED ORDER — CEPHALEXIN 500 MG PO CAPS: 500.0000 mg | ORAL_CAPSULE | Freq: Four times a day (QID) | ORAL | 3 refills | Status: DC

## 2017-09-21 MED ORDER — AMPHETAMINE-DEXTROAMPHET ER 10 MG PO CP24: 10.0000 mg | ORAL_CAPSULE | Freq: Every day | ORAL | 0 refills | Status: DC

## 2017-09-21 MED ORDER — AMOXICILLIN 500 MG PO CAPS: 500.0000 mg | ORAL_CAPSULE | Freq: Three times a day (TID) | ORAL | 5 refills | Status: DC

## 2017-09-21 NOTE — Progress Notes (Signed)
Patient: Christopher Herman Male    DOB: 1976-01-05   41 y.o.   MRN: [id number] Visit Date: 09/21/2017  Today's Provider: Lelon Huh, MD   Chief Complaint  Patient presents with  . Obesity    Needs FMLA paperwork filled out.   Subjective:    HPI  Patient presents today to have his FMLA papers done. He has very large masses in both lower legs determined be lipomas on past ultrasound. Has had evaluation by generally surgery but declined resection was declined due to massive obesity of patient. He frequently is unable to get himself up, dressed and prepared for work on time due to severe pains and dyspnea on exertion.   He also reports he has been having episodes of heart racing and pounding and night, happens a couple of times a week for the last months, feels a little short of breath during episodes, not painful. No relation to exertion, does not have problems with this during the day  Also complains that he is having more leg cramps during the day, would like to try a muscle relaxer to help with this.   Also complains that he continues to have trouble focusing and concentrating at work. He has been prescribed Adderal 5mg  in the past which seems to help minimally initially. He has not been taking consistently for several months.       No Known Allergies   Current Outpatient Prescriptions:  .  furosemide (LASIX) 40 MG tablet, take 1-2 tablets by mouth once daily if needed, Disp: 30 tablet, Rfl: 12 .  ibuprofen (ADVIL,MOTRIN) 400 MG tablet, TAKE 1 TO 2 TABLETS BY MOUTH EVERY 8 HOURS AS NEEDED, Disp: 100 tablet, Rfl: 1 .  meloxicam (MOBIC) 15 MG tablet, take 1 tablet by mouth once daily with food, Disp: 30 tablet, Rfl: 12 .  traMADol (ULTRAM) 50 MG tablet, Take 1 tablet by mouth every 8 (eight) hours as needed. 1-2, Disp: , Rfl:  .  Vitamin D, Ergocalciferol, (DRISDOL) 50000 units CAPS capsule, take 1 capsule by mouth every week, Disp: 12 capsule, Rfl: 3 .   amphetamine-dextroamphetamine (ADDERALL XR) 5 MG 24 hr capsule, Take 1 capsule (5 mg total) by mouth daily. (Patient not taking: Reported on 09/21/2017), Disp: 30 capsule, Rfl: 0 .  cephALEXin (KEFLEX) 500 MG capsule, take 1 capsule by mouth four times a day (Patient not taking: Reported on 09/21/2017), Disp: 56 capsule, Rfl: 3  Review of Systems  Constitutional: Negative.   Respiratory: Positive for shortness of breath.   Cardiovascular: Positive for leg swelling.  Musculoskeletal: Positive for arthralgias, back pain and gait problem.  Neurological: Negative for dizziness, light-headedness and headaches.    Social History  Substance Use Topics  . Smoking status: Former Research scientist (life sciences)  . Smokeless tobacco: Never Used     Comment: Quit smoking in 2010, smoked cigarettes, smoked for about 16 years; smoked less than 1/2 pack per day.  . Alcohol use No   Objective:   BP 124/82 (BP Location: Left Wrist, Patient Position: Sitting, Cuff Size: Normal)   Pulse 72   Temp 98.3 F (36.8 C) (Oral)   Resp 16     Physical Exam  General Appearance:    Alert, cooperative, no distress, obese  Eyes:    PERRL, conjunctiva/corneas clear, EOM's intact       Lungs:     Clear to auscultation bilaterally, respirations unlabored  Heart:    Regular rate and rhythm  Neurologic:  Awake, alert, oriented x 3. No apparent focal neurological           defect.   MS:   Enormous lipomas of calves of both lower legs.  Few follicular lesions of lower legs. Moderately edema noted.        Assessment & Plan:     1. Lipoma of lower extremity, unspecified laterality   2. Edema, unspecified type Chronic, continue current diuretics prn. Completed FMLA due to mobility limitations making him unable to get to work for up to a week up to 5 times per month.   3. Attention deficit disorder, unspecified hyperactivity presence Minimal benefit from low dose of Adderall, will increase to 10mg  daily.  - amphetamine-dextroamphetamine  (ADDERALL XR) 10 MG 24 hr capsule; Take 1 capsule (10 mg total) by mouth daily.  Dispense: 30 capsule; Refill: 0  4. Essential (primary) hypertension Continue sodium avoidance.  - Lipid panel  5. Palpitations  - COMPLETE METABOLIC PANEL WITH GFR - TSH - Holter monitor - 48 hour; Future  6. Obesity, morbid ([place])   7. Iron deficiency anemia, unspecified iron deficiency anemia type  - CBC  8. Positive depression screening He doesn't feel this is significant problem and not interested in antidepressant. Amy benefit from Adderall as above.   9. Bilateral leg cramps  - cyclobenzaprine (FLEXERIL) 5 MG tablet; Take 1 tablet (5 mg total) by mouth 3 (three) times daily as needed for muscle spasms.  Dispense: 30 tablet; Refill: 1  Addressed extensive list of chronic and acute medical problems today requiring extensive time in counseling and coordination of care.  Over half of this 45 minute visit were spent in counseling and coordinating care of multiple medical problems.       Lelon Huh, MD  [facility]

## 2017-09-24 ENCOUNTER — Ambulatory Visit: Payer: BLUE CROSS/BLUE SHIELD | Attending: Family Medicine

## 2017-10-23 ENCOUNTER — Ambulatory Visit: Payer: BLUE CROSS/BLUE SHIELD | Attending: Family Medicine

## 2018-03-01 ENCOUNTER — Telehealth: Payer: Self-pay | Admitting: Family Medicine

## 2018-07-28 ENCOUNTER — Other Ambulatory Visit: Payer: Self-pay | Admitting: Family Medicine

## 2018-07-28 MED ORDER — AMOXICILLIN 500 MG PO CAPS: 500.0000 mg | ORAL_CAPSULE | Freq: Three times a day (TID) | ORAL | 1 refills | Status: DC

## 2018-07-28 NOTE — Telephone Encounter (Signed)
Please review. Possibly Amoxicillin?

## 2018-07-28 NOTE — Telephone Encounter (Signed)
Needs refill on her "infection pills" that she takes.  She did not know the name.  She said she takes them for a chronic illness that she has...  [facility] [address]  Pt # [place] [phone]  Con Memos

## 2018-08-18 ENCOUNTER — Other Ambulatory Visit: Payer: Self-pay | Admitting: Family Medicine

## 2018-08-18 MED ORDER — MELOXICAM 15 MG PO TABS: ORAL_TABLET | ORAL | 12 refills | Status: DC

## 2018-08-18 NOTE — Telephone Encounter (Signed)
[facility] pharmacy faxed a refill request for the following medication. Thanks CC  meloxicam (MOBIC) 15 MG tablet

## 2018-09-13 ENCOUNTER — Ambulatory Visit: Payer: BLUE CROSS/BLUE SHIELD | Admitting: Family Medicine

## 2018-09-13 ENCOUNTER — Encounter: Payer: Self-pay | Admitting: Family Medicine

## 2018-09-13 VITALS — BP 136/87 | HR 86 | Temp 98.0°F | Resp 20

## 2018-09-13 DIAGNOSIS — E559 Vitamin D deficiency, unspecified: Secondary | ICD-10-CM

## 2018-09-13 DIAGNOSIS — D1720 Benign lipomatous neoplasm of skin and subcutaneous tissue of unspecified limb: Secondary | ICD-10-CM

## 2018-09-13 DIAGNOSIS — R609 Edema, unspecified: Secondary | ICD-10-CM | POA: Diagnosis not present

## 2018-09-13 DIAGNOSIS — I10 Essential (primary) hypertension: Secondary | ICD-10-CM | POA: Diagnosis not present

## 2018-09-13 MED ORDER — ACETAMINOPHEN 325 MG PO TABS: 650.0000 mg | ORAL_TABLET | Freq: Four times a day (QID) | ORAL | 5 refills | Status: DC | PRN

## 2018-09-13 MED ORDER — FUROSEMIDE 80 MG PO TABS: ORAL_TABLET | ORAL | 1 refills | Status: DC

## 2018-09-13 MED ORDER — AMOXICILLIN 500 MG PO CAPS: 500.0000 mg | ORAL_CAPSULE | Freq: Three times a day (TID) | ORAL | 3 refills | Status: DC

## 2018-09-13 MED ORDER — MELOXICAM 15 MG PO TABS: ORAL_TABLET | ORAL | 12 refills | Status: DC

## 2018-09-13 NOTE — Progress Notes (Signed)
Patient: Christopher Herman Male    DOB: 1976/12/07   42 y.o.   MRN: [id number] Visit Date: 09/13/2018  Today's Provider: Lelon Huh, MD   Chief Complaint  Patient presents with  . Follow-up   Subjective:    HPI   Hypertension, follow-up:  BP Readings from Last 3 Encounters:  09/13/18 136/87  09/21/17 124/82  09/30/16 110/70    He was last seen for hypertension 1 years ago.  BP at that visit was 124/82. Management since that visit includes; labs ordered, but zero were done.He reports good compliance with treatment. He is not having side effects.  He is not exercising. He is not adherent to low salt diet.   Outside blood pressures are not checked. He is experiencing fatigue and lower extremity edema.  Cardiovascular risk factors include hypertension, male gender, obesity (BMI >= 30 kg/m2) and sedentary lifestyle.  Use of agents associated with hypertension: none.   ------------------------------------------------------------------------  Lipoma of lower extremity, unspecified laterality From 09/21/2017-no changes.   Edema, unspecified type From 09/21/2017-continue current diuretics prn. Completed FMLA due to mobility limitations making him unable to get to work for up to a week up to 5 times per month. Patient comes in today reporting that this problem has worsened.     No Known Allergies   Current Outpatient Medications:  .  furosemide (LASIX) 40 MG tablet, take 1-2 tablets by mouth once daily if needed, Disp: 30 tablet, Rfl: 12 .  amphetamine-dextroamphetamine (ADDERALL XR) 10 MG 24 hr capsule, Take 1 capsule (10 mg total) by mouth daily. (Patient not taking: Reported on 09/13/2018), Disp: 30 capsule, Rfl: 0 .  cyclobenzaprine (FLEXERIL) 5 MG tablet, Take 1 tablet (5 mg total) by mouth 3 (three) times daily as needed for muscle spasms. (Patient not taking: Reported on 09/13/2018), Disp: 30 tablet, Rfl: 1 .  ibuprofen (ADVIL,MOTRIN) 400 MG tablet, TAKE 1 TO 2  TABLETS BY MOUTH EVERY 8 HOURS AS NEEDED (Patient not taking: Reported on 09/13/2018), Disp: 100 tablet, Rfl: 1 .  meloxicam (MOBIC) 15 MG tablet, take 1 tablet by mouth once daily with food (Patient not taking: Reported on 09/13/2018), Disp: 30 tablet, Rfl: 12 .  traMADol (ULTRAM) 50 MG tablet, Take 1 tablet by mouth every 8 (eight) hours as needed. 1-2, Disp: , Rfl:  .  Vitamin D, Ergocalciferol, (DRISDOL) 50000 units CAPS capsule, take 1 capsule by mouth every week (Patient not taking: Reported on 09/13/2018), Disp: 12 capsule, Rfl: 3  Review of Systems  Constitutional: Positive for fatigue. Negative for appetite change, chills and fever.  Respiratory: Positive for shortness of breath. Negative for chest tightness and wheezing.   Cardiovascular: Positive for leg swelling. Negative for chest pain and palpitations.  Gastrointestinal: Negative for abdominal pain, nausea and vomiting.    Social History   Tobacco Use  . Smoking status: Former Research scientist (life sciences)  . Smokeless tobacco: Never Used  . Tobacco comment: Quit smoking in 2010, smoked cigarettes, smoked for about 16 years; smoked less than 1/2 pack per day.  Substance Use Topics  . Alcohol use: No   Objective:   BP 136/87   Pulse 86   Temp 98 F (36.7 C) (Oral)   Resp 20     Physical Exam  General Appearance:    Alert, cooperative, no distress, obese  Eyes:    PERRL, conjunctiva/corneas clear, EOM's intact       Lungs:     Clear to auscultation bilaterally, respirations unlabored  Heart:    Regular rate and rhythm  Neurologic:   Awake, alert, oriented x 3. No apparent focal neurological           defect.   MS:   Enormous lipomas of calves of both lower legs.  Few follicular lesions of lower legs. Moderately edema noted.       Assessment & Plan:     1. 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  - furosemide (LASIX) 80 MG tablet; Take one tablet daily  Dispense: 30 tablet; Refill: 1 - amoxicillin (AMOXIL) 500 MG  capsule; Take 1 capsule (500 mg total) by mouth 3 (three) times daily.  Dispense: 30 capsule; Refill: 3 - CBC - Comprehensive metabolic panel - Lipid panel - TSH  2. Lipoma of lower extremity, unspecified laterality   3. Essential (primary) hypertension Well controlled.  Continue current medications.    4. Vitamin D deficiency  - VITAMIN D 25 Hydroxy (Vit-D Deficiency, Fractures)  5. Obesity, morbid ([place])  Reviewed FMLA forms and time out. Forms will be completed and faxed this week.        Lelon Huh, MD  [facility]

## 2018-09-16 ENCOUNTER — Telehealth: Payer: Self-pay | Admitting: *Deleted

## 2018-09-16 NOTE — Telephone Encounter (Signed)
Patient called office for status on FMLA forms. Patient wanted to know if they have been completed yet? Please advise?

## 2018-09-17 NOTE — Telephone Encounter (Signed)
They've been completed. I don't know if they have been faxed yet.

## 2018-09-17 NOTE — Telephone Encounter (Signed)
LMOVM for pt to return call. Need to know if patient wants forms faxed or if he is going to pick them up.

## 2018-09-17 NOTE — Telephone Encounter (Signed)
Pt is requesting FMLA forms be faxed attention to HR Dept [phone]. Pt would like a copy of the forms placed up front after they have been faxed so he can pick up a copy of the forms. Please advise. Thanks TNP

## 2018-09-17 NOTE — Telephone Encounter (Signed)
Spoke to patient and he said he would like to pick up the forms.  Thanks  [employer]

## 2018-09-20 NOTE — Telephone Encounter (Signed)
Faxed

## 2018-09-22 NOTE — Telephone Encounter (Signed)
Fax # below is not correct. Fax fails every time. Left message on pt's vm to contact office with correct #.

## 2018-09-22 NOTE — Telephone Encounter (Signed)
Pt states HR Dept hasn't received the FMLA paperwork. Pt asks if the paperwork can be refaxed today at [phone].  Thanks, [employer]

## 2018-09-23 NOTE — Telephone Encounter (Signed)
Fax did go through. Patient picked up forms this morning.

## 2018-11-12 ENCOUNTER — Telehealth: Payer: Self-pay

## 2018-11-12 NOTE — Telephone Encounter (Signed)
Pt advised.   Thanks,   -Lalanya Rufener  

## 2018-11-12 NOTE — Telephone Encounter (Signed)
Patient called saying that he has had diarrhea for the last 2 weeks, and it does not seem to get any better. He denies any bloody or black stools. He does have "spells" of nausea. Denies fevers. He has taken with no relief. He wanted to know if Dr. Caryn Section had any recommendations on what he could take? [place]!

## 2018-11-12 NOTE — Telephone Encounter (Signed)
He can take OTC imodium.

## 2018-11-19 ENCOUNTER — Telehealth: Payer: Self-pay | Admitting: Family Medicine

## 2018-11-19 NOTE — Telephone Encounter (Signed)
Patient called upset because of changes that were made on his FMLA form that was initially completed on 09/15/2018. He states the change that was made on 10/21/2018 only allowed him to miss 4 days per episode. He states that it was supposed to be 7 days per episode. Patient says he doesn't like that we went behind his back and communicated with his employer without his permission, or without letting him know of this change. He says that we "threw him under the bus, and had him looking like a fool." Patient states that he has been a patient here for over 10 years and he is not happy with how things were handled. He states he is upset because he had to pay for this form, and it wasn't filled out correct. Patient says he is going to "replace [place]." I advised patient that I would speak with Dr. Caryn Section regarding this situation, and we would get back with him. Patient replied, "you do what you have to do, have a nice day" and then hung up.

## 2018-11-19 NOTE — Telephone Encounter (Signed)
Pt is calling about a message she got from her place of employment stating:  Updated order was sent 10-21-18.  Please call pt back to discuss asap.   Pt is as at work please leave a message if no answer.  Thanks, [employer]

## 2020-01-19 ENCOUNTER — Telehealth: Payer: Self-pay

## 2020-01-19 NOTE — Telephone Encounter (Signed)
Error

## 2020-01-19 NOTE — Telephone Encounter (Signed)
Patient calling regarding FMLA forms. He is requesting call back.    Patient also requesting to speak about possible "GERD".

## 2020-01-19 NOTE — Telephone Encounter (Signed)
Dr. Caryn Section, please review. PEC attached a recent message to a message that was created over 1 year ago. Previous message from patient dated 11/19/2018, patient stated that he was not happy with how things were handled in our office and he was going to replace [place]. Patient is now calling requesting to discuss FMLA forms. Do you know anything about this?

## 2020-01-24 NOTE — Telephone Encounter (Signed)
I have no idea what this is about

## 2020-01-25 NOTE — Telephone Encounter (Signed)
I called the patient to find out what was needed.  He said that he needed FMLA papers filled out.  I told him he had not been seen since 08/2018 and we would need to see him.  He said he doesn't need to come in and he wasn't going to come in because he said there is nothing we can do to help him.  He said this has been going on since 2010 and nothing has changed.  I told him that legally and ethically we could not say that nothing had changed unless we saw him.  He then said we only wanted him to come in so we could get money and that he was not going to come in.   For clarification I again asked him if what he wanted was to not ever come in and we just keep filling out the papers and he said yes.  I told him we could not do that.  He was very rude and kept cutting me off when I tried to ask any questions.  I told him I would let you know that he was not going to be coming back.

## 2020-01-27 ENCOUNTER — Other Ambulatory Visit: Payer: Self-pay

## 2020-01-27 ENCOUNTER — Ambulatory Visit (INDEPENDENT_AMBULATORY_CARE_PROVIDER_SITE_OTHER): Payer: Managed Care, Other (non HMO) | Admitting: Family Medicine

## 2020-01-27 ENCOUNTER — Encounter: Payer: Self-pay | Admitting: Family Medicine

## 2020-01-27 DIAGNOSIS — M25569 Pain in unspecified knee: Secondary | ICD-10-CM | POA: Diagnosis not present

## 2020-01-27 DIAGNOSIS — R609 Edema, unspecified: Secondary | ICD-10-CM

## 2020-01-27 DIAGNOSIS — M2550 Pain in unspecified joint: Secondary | ICD-10-CM | POA: Diagnosis not present

## 2020-01-27 DIAGNOSIS — G8929 Other chronic pain: Secondary | ICD-10-CM

## 2020-01-27 MED ORDER — NIZATIDINE 300 MG PO CAPS: 300.0000 mg | ORAL_CAPSULE | Freq: Two times a day (BID) | ORAL | 3 refills | Status: DC | PRN

## 2020-01-27 NOTE — Progress Notes (Signed)
Patient: Christopher Herman Male    DOB: Feb 07, 1976   44 y.o.   MRN: [id number] Visit Date: 01/27/2020  Today's Provider: Lelon Huh, MD   Chief Complaint  Patient presents with  . Edema  . Obesity   Subjective:    Virtual Visit via Telephone Note  I connected with Christopher Herman on 02/28/20 at  9:20 AM EST by telephone and verified that I am speaking with the correct person using two identifiers.  Location: Patient: Home Provider: [facility]   I discussed the limitations, risks, security and privacy concerns of performing an evaluation and management service by telephone and the availability of in person appointments. I also discussed with the patient that there may be a patient responsible charge related to this service. The patient expressed understanding and agreed to proceed.  HPI  Follow up for Edema:  The patient was last seen for this more than 1 year ago. Changes made at last visit include prescribing Furosemide and Amoxicillin for periodic cellulitis due to edema.  He reports good compliance with treatment. He feels that condition is Unchanged. He is not having side effects.   He continues to have episodes or severe swelling making it impossible to ambulate to his vehicle several times a month causing him to be unable to transport himself to work for 1-5 days at a time.    Follow up for Obesity:  The patient was last seen for this more than 1 month ago. Changes made at last visit include none; FMLA paperwork was completed.  He reports good compliance with treatment. He feels that condition is Unchanged. He is not having side effects.   ------------------------------------------------------------------------------------  No Known Allergies   Current Outpatient Medications:  .  acetaminophen (TYLENOL) 325 MG tablet, Take 2 tablets (650 mg total) by mouth every 6 (six) hours as needed., Disp: 100 tablet, Rfl: 5 .  furosemide (LASIX)  80 MG tablet, Take one tablet daily, Disp: 30 tablet, Rfl: 1 .  meloxicam (MOBIC) 15 MG tablet, take 1 tablet by mouth once daily with food, Disp: 30 tablet, Rfl: 12 .  traMADol (ULTRAM) 50 MG tablet, Take 1 tablet by mouth every 8 (eight) hours as needed. 1-2, Disp: , Rfl:   Review of Systems  Constitutional: Negative for appetite change, chills and fever.  Respiratory: Negative for chest tightness, shortness of breath and wheezing.   Cardiovascular: Positive for leg swelling. Negative for chest pain and palpitations.  Gastrointestinal: Negative for abdominal pain, nausea and vomiting.    Social History   Tobacco Use  . Smoking status: Former Research scientist (life sciences)  . Smokeless tobacco: Never Used  . Tobacco comment: Quit smoking in 2010, smoked cigarettes, smoked for about 16 years; smoked less than 1/2 pack per day.  Substance Use Topics  . Alcohol use: No      Objective:   There were no vitals taken for this visit. There were no vitals filed for this visit.There is no height or weight on file to calculate BMI.   Physical Exam  Awake, alert, oriented x 3. In no apparent distress  No results found for any visits on 01/27/20.     Assessment & Plan    1. Obesity, morbid ([place])   2. Edema, unspecified type   3. Chronic knee pain, unspecified laterality   4. Pain in joint, multiple sites  Reviewed and completed FMLA renewal for his chronic medical conditions.   GERD - nizatidine (AXID) 300 MG  capsule; Take 1 capsule (300 mg total) by mouth 2 (two) times daily as needed for heartburn.  Dispense: 60 capsule; Refill: 3      I discussed the assessment and treatment plan with the patient. The patient was provided an opportunity to ask questions and all were answered. The patient agreed with the plan and demonstrated an understanding of the instructions.   The patient was advised to call back or seek an in-person evaluation if the symptoms worsen or if the condition fails to improve as  anticipated.  I provided 20 minutes of non-face-to-face time during this encounter.   Lelon Huh, MD  [facility]

## 2020-02-03 ENCOUNTER — Telehealth: Payer: Self-pay | Admitting: Family Medicine

## 2020-02-03 NOTE — Telephone Encounter (Signed)
Patient is calling to check on the status of his FMLA paperwork Please advise CB- [phone]

## 2020-02-03 NOTE — Telephone Encounter (Signed)
It was sent to medical records to be faxed the same day as his appointment

## 2020-02-06 NOTE — Telephone Encounter (Signed)
Haven't seen FMLA forms for pt. Please advise. Thanks TNP

## 2020-02-08 NOTE — Telephone Encounter (Signed)
Pt is calling checking on the status of FMLA  Please call pt

## 2020-02-08 NOTE — Telephone Encounter (Signed)
Dr. Caryn Section we checked with Helene Kelp in medical records and and says that she has not seen patient's FMLA forms.

## 2020-02-08 NOTE — Telephone Encounter (Signed)
Called pt to advise his FMLA forms were faxed this afternoon to [phone]. I left message for pt on voicemail. Thanks TNP

## 2020-02-27 NOTE — Telephone Encounter (Signed)
Pt called to requests that the FMLA paperwork work be faxed to fax# [phone] because he was advised that it was never received

## 2020-02-29 NOTE — Telephone Encounter (Signed)
FMLA forms faxed again to [phone]. Thanks TNP

## 2020-03-26 ENCOUNTER — Other Ambulatory Visit: Payer: Self-pay | Admitting: Family Medicine

## 2020-03-26 DIAGNOSIS — R609 Edema, unspecified: Secondary | ICD-10-CM

## 2020-03-27 ENCOUNTER — Other Ambulatory Visit: Payer: Self-pay

## 2020-03-27 DIAGNOSIS — R609 Edema, unspecified: Secondary | ICD-10-CM

## 2020-03-27 MED ORDER — AMOXICILLIN 500 MG PO CAPS: 500.0000 mg | ORAL_CAPSULE | Freq: Three times a day (TID) | ORAL | 3 refills | Status: DC

## 2020-03-27 MED ORDER — FUROSEMIDE 40 MG PO TABS: 40.0000 mg | ORAL_TABLET | Freq: Every day | ORAL | 1 refills | Status: DC | PRN

## 2020-03-27 NOTE — Addendum Note (Signed)
Addended by: Birdie Sons on: 03/27/2020 04:38 PM   Modules accepted: Orders

## 2020-03-27 NOTE — Telephone Encounter (Signed)
We need more information of why he needs an antibiotic. Our office policy requires an office visit before we prescribe antibiotics. Tried calling patient. Left message to call back. OK for PEC to advise patient and schedule appointment.

## 2020-03-27 NOTE — Telephone Encounter (Signed)
OK to send amoxicillin and furosemide to get by until his appointment.  Go to ER if there is any fever or severe pain in legs.

## 2020-03-27 NOTE — Telephone Encounter (Signed)
Patient was advised of message below by [facility] team member Greggory Keen. Rosanne Ashing states that patient needs an antibiotic because he has an infection in his leg. Patient is not able to come in for an office visit because he is immobile, however he did schedule a virtual visit for 03/30/2020 at 9am

## 2020-03-27 NOTE — Telephone Encounter (Signed)
This message is being sent at the same time Dr Maralyn Sago team is being notified of it.  When PEC called about his request for an antibiotic being denied they did not mention anything about the patient being immobile.  The message was not triaged by an RN from [facility].     Copied from [place] [id number]. Topic: Quick Communication - See Telephone Encounter >> Mar 27, 2020  2:32 PM Randal Buba, CMA wrote: CRM for notification. See Telephone encounter for: 03/27/20. Ok for [facility] to discuss with patient >> Mar 27, 2020  2:45 PM Greggory Keen D wrote: Appt made for a virtual appt on April 2 at 9:00.  Pt said he is not mobile to come in.  He thinks he has an infection in his left leg

## 2020-03-27 NOTE — Telephone Encounter (Signed)
Patient advised and verbally voiced understanding. Prescriptions sent into pharmacy.

## 2020-03-27 NOTE — Addendum Note (Signed)
Addended by: Randal Buba on: 03/27/2020 03:29 PM   Modules accepted: Orders

## 2020-03-27 NOTE — Addendum Note (Signed)
Addended by: Randal Buba on: 03/27/2020 05:04 PM   Modules accepted: Orders

## 2020-03-27 NOTE — Telephone Encounter (Signed)
Pt called in to follow up on refill request. Pt would like to discuss further as he states that he need his medications refills.  Please assist

## 2020-03-30 ENCOUNTER — Encounter: Payer: Self-pay | Admitting: Family Medicine

## 2020-03-30 ENCOUNTER — Ambulatory Visit (INDEPENDENT_AMBULATORY_CARE_PROVIDER_SITE_OTHER): Payer: Managed Care, Other (non HMO) | Admitting: Family Medicine

## 2020-03-30 VITALS — Ht 65.0 in

## 2020-03-30 DIAGNOSIS — I890 Lymphedema, not elsewhere classified: Secondary | ICD-10-CM

## 2020-03-30 DIAGNOSIS — M79605 Pain in left leg: Secondary | ICD-10-CM | POA: Diagnosis not present

## 2020-03-30 DIAGNOSIS — L0390 Cellulitis, unspecified: Secondary | ICD-10-CM

## 2020-03-30 NOTE — Progress Notes (Signed)
Virtual telephone visit      Virtual Visit via Telephone Note   This visit type was conducted due to national recommendations for restrictions regarding the COVID-19 Pandemic (e.g. social distancing) in an effort to limit this patient's exposure and mitigate transmission in our community. Due to his co-morbid illnesses, this patient is at least at moderate risk for complications without adequate follow up. This format is felt to be most appropriate for this patient at this time. The patient did not have access to video technology or had technical difficulties with video requiring transitioning to audio format only (telephone). Physical exam was limited to content and character of the telephone converstion.    Patient location: home  Provider location: bfp    Patient: Christopher Herman   DOB: 1976-10-10   44 y.o. Male  MRN: [id number] Visit Date: 03/30/2020  Today's Provider: Lelon Huh, MD  Subjective:    Chief Complaint  Patient presents with  . Leg Pain   Leg Pain  Incident onset: 1 week ago  The injury mechanism was a fall. The pain is present in the left leg. The quality of the pain is described as burning. The pain is at a severity of 3/10. The pain has been constant since onset. He reports no foreign bodies present. Treatments tried: Antibiotic. The treatment provided mild relief.  Pain is in LEFT thigh. State he fell a few weeks ago and leg was caught underneath him for prolonged period of time. States he thinks he is going to be bed bound for about 3 weeks due to obesity. He as started low carb liquid diet which he has been on before and states he is usually able to lose weight with. He states his mother helps him get around.  He reports he had fall in 2014 and injured leg and it doesn't feel like it has every completely healed on the inside.      Medications: Outpatient Medications Prior to Visit  Medication Sig  . acetaminophen (TYLENOL) 325 MG tablet Take 2 tablets  (650 mg total) by mouth every 6 (six) hours as needed.  Marland Kitchen amoxicillin (AMOXIL) 500 MG capsule Take 1 capsule (500 mg total) by mouth 3 (three) times daily.  . furosemide (LASIX) 40 MG tablet Take 1 tablet (40 mg total) by mouth daily as needed. Take one tablet daily  . meloxicam (MOBIC) 15 MG tablet take 1 tablet by mouth once daily with food  . nizatidine (AXID) 300 MG capsule Take 1 capsule (300 mg total) by mouth 2 (two) times daily as needed for heartburn.  . traMADol (ULTRAM) 50 MG tablet Take 1 tablet by mouth every 8 (eight) hours as needed. 1-2   No facility-administered medications prior to visit.    Review of Systems      Objective:    Ht 5\' 5"  (1.651 m)   BMI 94.02 kg/m    Awake, alert, oriented x 3. In no apparent distress     Assessment & Plan:    1. Pain of left lower extremity Secondary to recent fall, but has chronic pain last evaluated in 2014 by patient report. Recommend he have xray done. Patient states his mobility is limited due to gaining weight and unable to come in for xrays at this time, but expects to be able to have done in a few weeks since he has started stringent diet.  I recommended home physical therapy, but he states his mother helps him get around and he will  call if he feels he needs any additional help or therapy  2. Cellulitis, unspecified cellulitis site He reports is improving on amoxicillin and   3. Obesity, morbid ([place]) He has recently started strict diet. Consider treatment with GLP-1 agonist and will discuss further at follow up.   4. Chronic acquired lymphedema Continue furosemide.      I discussed the assessment and treatment plan with the patient. The patient was provided an opportunity to ask questions and all were answered. The patient agreed with the plan and demonstrated an understanding of the instructions.   The patient was advised to call back or seek an in-person evaluation if the symptoms worsen or if the condition fails  to improve as anticipated.  I provided 12 minutes of non-face-to-face time during this encounter.   Lelon Huh, MD  [facility] [phone] (phone) [phone] (fax)  [place]

## 2020-08-07 NOTE — Telephone Encounter (Signed)
error 

## 2021-08-14 ENCOUNTER — Telehealth: Payer: Self-pay

## 2021-08-14 NOTE — Telephone Encounter (Signed)
Copied from [place] [phone]. Topic: General - Other >> Aug 02, 2021 12:31 PM Pawlus, Brayton Layman A wrote: Reason for CRM: Pt stated he will drop off FMLA paperwork at the office today [place].

## 2021-08-20 NOTE — Telephone Encounter (Signed)
Patient called requesting an update on FMLA paperwork.  His last office visit was 03/30/2020. Please advise if patient needs office visit to complete FMLA forms.

## 2021-08-20 NOTE — Telephone Encounter (Signed)
Patient calling back about status of fmla paperwork, says still has health issues as previous

## 2021-08-21 NOTE — Telephone Encounter (Signed)
I spoke with Mr. Carten.  He says he is not able to come in due to being bed bound and unable to walk.  Is it possible to fill out the paper work with a virtual visit?  Thanks,   -Mickel Baas

## 2021-08-21 NOTE — Telephone Encounter (Signed)
Yes. He needs to have been seen at least within the last year to completely the [facility]

## 2021-10-02 ENCOUNTER — Ambulatory Visit: Payer: Self-pay | Admitting: *Deleted

## 2021-10-02 NOTE — Telephone Encounter (Signed)
Reason for Disposition  Diarrhea is a chronic symptom (recurrent or ongoing AND present > 4 weeks)  Answer Assessment - Initial Assessment Questions 1. DIARRHEA SEVERITY: "How bad is the diarrhea?" "How many more stools have you had in the past 24 hours than normal?"    - NO DIARRHEA (SCALE 0)   - MILD (SCALE 1-3): Few loose or mushy BMs; increase of 1-3 stools over normal daily number of stools; mild increase in ostomy output.   -  MODERATE (SCALE 4-7): Increase of 4-6 stools daily over normal; moderate increase in ostomy output. * SEVERE (SCALE 8-10; OR 'WORST POSSIBLE'): Increase of 7 or more stools daily over normal; moderate increase in ostomy output; incontinence.     Mild to moderate amounts 2. ONSET: "When did the diarrhea begin?"      1  month ago  3. BM CONSISTENCY: "How loose or watery is the diarrhea?"     Pudding consistency  4. VOMITING: "Are you also vomiting?" If Yes, ask: "How many times in the past 24 hours?"      No  5. ABDOMINAL PAIN: "Are you having any abdominal pain?" If Yes, ask: "What does it feel like?" (e.g., crampy, dull, intermittent, constant)      Cramps at times  6. ABDOMINAL PAIN SEVERITY: If present, ask: "How bad is the pain?"  (e.g., Scale 1-10; mild, moderate, or severe)   - MILD (1-3): doesn't interfere with normal activities, abdomen soft and not tender to touch    - MODERATE (4-7): interferes with normal activities or awakens from sleep, abdomen tender to touch    - SEVERE (8-10): excruciating pain, doubled over, unable to do any normal activities       Mild  7. ORAL INTAKE: If vomiting, "Have you been able to drink liquids?" "How much liquids have you had in the past 24 hours?"     Drinking liquids but has diarrhea after eating or drinking  8. HYDRATION: "Any signs of dehydration?" (e.g., dry mouth [not just dry lips], too weak to stand, dizziness, new weight loss) "When did you last urinate?"     Chills at times but no dizziness or weight loss 9.  EXPOSURE: "Have you traveled to a foreign country recently?" "Have you been exposed to anyone with diarrhea?" "Could you have eaten any food that was spoiled?"     Na  10. ANTIBIOTIC USE: "Are you taking antibiotics now or have you taken antibiotics in the past 2 months?"       No  11. OTHER SYMPTOMS: "Do you have any other symptoms?" (e.g., fever, blood in stool)       No  12. PREGNANCY: "Is there any chance you are pregnant?" "When was your last menstrual period?"       na  Protocols used: [facility]

## 2021-10-02 NOTE — Telephone Encounter (Signed)
Diarrhea for the last month every time patient eats his bowls are loose and uncontrollable. Patient states imodium is not working seeking clinical advice      Called patient to review symptoms. C/o diarrhea x 1 month. C/o incontinence every time he stands. Can not walk without becoming incontinent. Feels like gas and becomes diarrhea. Taking imodium without relief. Some abdominal cramping at times but not everytime diarrhea happens. Patient reports prior to diarrhea 1 month ago he was constipated and strained and feels something happened and now patient has lost control of bowels. Earliest VV 10/15/21 with PCP. Encouraged patient to go to ED via EMS if symptoms worsen. Denies dizziness. Lightheadedness. Care advise given. Patient verbalized understanding of care advise and to call back or go to ED if symptoms worsen. Patient also reports he has not left home since 2020. Please advise

## 2021-10-15 ENCOUNTER — Telehealth: Payer: Managed Care, Other (non HMO) | Admitting: Family Medicine

## 2022-02-10 ENCOUNTER — Ambulatory Visit: Payer: Self-pay | Admitting: *Deleted

## 2022-02-10 ENCOUNTER — Encounter: Payer: Self-pay | Admitting: Emergency Medicine

## 2022-02-10 ENCOUNTER — Emergency Department: Payer: BC Managed Care – PPO

## 2022-02-10 ENCOUNTER — Other Ambulatory Visit: Payer: Self-pay

## 2022-02-10 ENCOUNTER — Inpatient Hospital Stay
Admission: EM | Admit: 2022-02-10 | Discharge: 2022-02-17 | DRG: 872 | Disposition: A | Discharge status: Home Health | Payer: BC Managed Care – PPO | Attending: Internal Medicine | Admitting: Internal Medicine

## 2022-02-10 DIAGNOSIS — R42 Dizziness and giddiness: Secondary | ICD-10-CM | POA: Diagnosis not present

## 2022-02-10 DIAGNOSIS — I959 Hypotension, unspecified: Secondary | ICD-10-CM | POA: Diagnosis not present

## 2022-02-10 DIAGNOSIS — Z6841 Body Mass Index (BMI) 40.0 and over, adult: Secondary | ICD-10-CM

## 2022-02-10 DIAGNOSIS — M6282 Rhabdomyolysis: Secondary | ICD-10-CM | POA: Diagnosis not present

## 2022-02-10 DIAGNOSIS — E876 Hypokalemia: Secondary | ICD-10-CM | POA: Diagnosis not present

## 2022-02-10 DIAGNOSIS — E8809 Other disorders of plasma-protein metabolism, not elsewhere classified: Secondary | ICD-10-CM | POA: Diagnosis present

## 2022-02-10 DIAGNOSIS — I890 Lymphedema, not elsewhere classified: Secondary | ICD-10-CM | POA: Diagnosis present

## 2022-02-10 DIAGNOSIS — Z87891 Personal history of nicotine dependence: Secondary | ICD-10-CM

## 2022-02-10 DIAGNOSIS — Z95828 Presence of other vascular implants and grafts: Secondary | ICD-10-CM

## 2022-02-10 DIAGNOSIS — R0689 Other abnormalities of breathing: Secondary | ICD-10-CM | POA: Diagnosis not present

## 2022-02-10 DIAGNOSIS — Z7401 Bed confinement status: Secondary | ICD-10-CM | POA: Diagnosis not present

## 2022-02-10 DIAGNOSIS — L89890 Pressure ulcer of other site, unstageable: Secondary | ICD-10-CM | POA: Diagnosis not present

## 2022-02-10 DIAGNOSIS — E872 Acidosis, unspecified: Secondary | ICD-10-CM | POA: Diagnosis not present

## 2022-02-10 DIAGNOSIS — D1720 Benign lipomatous neoplasm of skin and subcutaneous tissue of unspecified limb: Secondary | ICD-10-CM | POA: Diagnosis present

## 2022-02-10 DIAGNOSIS — R110 Nausea: Secondary | ICD-10-CM | POA: Diagnosis present

## 2022-02-10 DIAGNOSIS — A419 Sepsis, unspecified organism: Secondary | ICD-10-CM | POA: Diagnosis not present

## 2022-02-10 DIAGNOSIS — E559 Vitamin D deficiency, unspecified: Secondary | ICD-10-CM | POA: Diagnosis not present

## 2022-02-10 DIAGNOSIS — D75839 Thrombocytosis, unspecified: Secondary | ICD-10-CM | POA: Diagnosis present

## 2022-02-10 DIAGNOSIS — R000 Tachycardia, unspecified: Secondary | ICD-10-CM | POA: Diagnosis not present

## 2022-02-10 DIAGNOSIS — Z823 Family history of stroke: Secondary | ICD-10-CM

## 2022-02-10 DIAGNOSIS — K8020 Calculus of gallbladder without cholecystitis without obstruction: Secondary | ICD-10-CM | POA: Diagnosis not present

## 2022-02-10 DIAGNOSIS — L089 Local infection of the skin and subcutaneous tissue, unspecified: Secondary | ICD-10-CM | POA: Diagnosis not present

## 2022-02-10 DIAGNOSIS — I10 Essential (primary) hypertension: Secondary | ICD-10-CM | POA: Diagnosis not present

## 2022-02-10 DIAGNOSIS — D649 Anemia, unspecified: Secondary | ICD-10-CM | POA: Diagnosis not present

## 2022-02-10 DIAGNOSIS — E871 Hypo-osmolality and hyponatremia: Secondary | ICD-10-CM | POA: Diagnosis present

## 2022-02-10 DIAGNOSIS — R918 Other nonspecific abnormal finding of lung field: Secondary | ICD-10-CM | POA: Diagnosis not present

## 2022-02-10 DIAGNOSIS — E878 Other disorders of electrolyte and fluid balance, not elsewhere classified: Secondary | ICD-10-CM | POA: Diagnosis not present

## 2022-02-10 DIAGNOSIS — K219 Gastro-esophageal reflux disease without esophagitis: Secondary | ICD-10-CM | POA: Diagnosis present

## 2022-02-10 DIAGNOSIS — Z20822 Contact with and (suspected) exposure to covid-19: Secondary | ICD-10-CM | POA: Diagnosis not present

## 2022-02-10 DIAGNOSIS — E861 Hypovolemia: Secondary | ICD-10-CM | POA: Diagnosis not present

## 2022-02-10 DIAGNOSIS — G4733 Obstructive sleep apnea (adult) (pediatric): Secondary | ICD-10-CM | POA: Diagnosis present

## 2022-02-10 DIAGNOSIS — N179 Acute kidney failure, unspecified: Secondary | ICD-10-CM | POA: Diagnosis not present

## 2022-02-10 DIAGNOSIS — Z8249 Family history of ischemic heart disease and other diseases of the circulatory system: Secondary | ICD-10-CM

## 2022-02-10 DIAGNOSIS — Z9884 Bariatric surgery status: Secondary | ICD-10-CM

## 2022-02-10 DIAGNOSIS — R197 Diarrhea, unspecified: Secondary | ICD-10-CM | POA: Diagnosis not present

## 2022-02-10 DIAGNOSIS — Z833 Family history of diabetes mellitus: Secondary | ICD-10-CM

## 2022-02-10 LAB — BLOOD GAS, VENOUS
Acid-base deficit: 10.9 mmol/L — ABNORMAL HIGH (ref 0.0–2.0)
Bicarbonate: 13.6 mmol/L — ABNORMAL LOW (ref 20.0–28.0)
O2 Saturation: 78 %
Patient temperature: 37
pCO2, Ven: 27 mmHg — ABNORMAL LOW (ref 44.0–60.0)
pH, Ven: 7.31 (ref 7.250–7.430)
pO2, Ven: 47 mmHg — ABNORMAL HIGH (ref 32.0–45.0)

## 2022-02-10 LAB — CBC WITH DIFFERENTIAL/PLATELET
Abs Immature Granulocytes: 1.41 10*3/uL — ABNORMAL HIGH (ref 0.00–0.07)
Basophils Absolute: 0.1 10*3/uL (ref 0.0–0.1)
Basophils Relative: 0 %
Eosinophils Absolute: 0 10*3/uL (ref 0.0–0.5)
Eosinophils Relative: 0 %
HCT: 32.7 % — ABNORMAL LOW (ref 39.0–52.0)
Hemoglobin: 10.8 g/dL — ABNORMAL LOW (ref 13.0–17.0)
Immature Granulocytes: 3 %
Lymphocytes Relative: 6 %
Lymphs Abs: 2.4 10*3/uL (ref 0.7–4.0)
MCH: 27 pg (ref 26.0–34.0)
MCHC: 33 g/dL (ref 30.0–36.0)
MCV: 81.8 fL (ref 80.0–100.0)
Monocytes Absolute: 1.4 10*3/uL — ABNORMAL HIGH (ref 0.1–1.0)
Monocytes Relative: 3 %
Neutro Abs: 37 10*3/uL — ABNORMAL HIGH (ref 1.7–7.7)
Neutrophils Relative %: 88 %
Platelets: 728 10*3/uL — ABNORMAL HIGH (ref 150–400)
RBC: 4 MIL/uL — ABNORMAL LOW (ref 4.22–5.81)
RDW: 15.6 % — ABNORMAL HIGH (ref 11.5–15.5)
WBC: 42.3 10*3/uL — ABNORMAL HIGH (ref 4.0–10.5)
nRBC: 0 % (ref 0.0–0.2)

## 2022-02-10 LAB — COMPREHENSIVE METABOLIC PANEL
ALT: 26 U/L (ref 0–44)
AST: 92 U/L — ABNORMAL HIGH (ref 15–41)
Albumin: 2.6 g/dL — ABNORMAL LOW (ref 3.5–5.0)
Alkaline Phosphatase: 105 U/L (ref 38–126)
Anion gap: 23 — ABNORMAL HIGH (ref 5–15)
BUN: 29 mg/dL — ABNORMAL HIGH (ref 6–20)
CO2: 14 mmol/L — ABNORMAL LOW (ref 22–32)
Calcium: 5.6 mg/dL — CL (ref 8.9–10.3)
Chloride: 89 mmol/L — ABNORMAL LOW (ref 98–111)
Creatinine, Ser: 3.03 mg/dL — ABNORMAL HIGH (ref 0.61–1.24)
GFR, Estimated: 25 mL/min — ABNORMAL LOW (ref >60)
Glucose, Bld: 96 mg/dL (ref 70–99)
Potassium: 4.1 mmol/L (ref 3.5–5.1)
Sodium: 126 mmol/L — ABNORMAL LOW (ref 135–145)
Total Bilirubin: 2.1 mg/dL — ABNORMAL HIGH (ref 0.3–1.2)
Total Protein: 9.1 g/dL — ABNORMAL HIGH (ref 6.5–8.1)

## 2022-02-10 LAB — CBG MONITORING, ED: Glucose-Capillary: 109 mg/dL — ABNORMAL HIGH (ref 70–99)

## 2022-02-10 LAB — BRAIN NATRIURETIC PEPTIDE: B Natriuretic Peptide: 105.3 pg/mL — ABNORMAL HIGH (ref 0.0–100.0)

## 2022-02-10 LAB — LACTIC ACID, PLASMA: Lactic Acid, Venous: 3 mmol/L (ref 0.5–1.9)

## 2022-02-10 LAB — TROPONIN I (HIGH SENSITIVITY): Troponin I (High Sensitivity): 12 ng/L (ref <18)

## 2022-02-10 MED ORDER — SODIUM CHLORIDE 0.9 % IV BOLUS (SEPSIS): 1000.0000 mL | Freq: Once | INTRAVENOUS | Status: DC

## 2022-02-10 MED ORDER — CALCIUM GLUCONATE-NACL 1-0.675 GM/50ML-% IV SOLN
1.0000 g | Freq: Once | INTRAVENOUS | Status: AC
Start: 2022-02-10 — End: 2022-02-11
  Administered 2022-02-11: 1000 mg via INTRAVENOUS
  Filled 2022-02-10: qty 50

## 2022-02-10 MED ORDER — CALCIUM GLUCONATE 10 % IV SOLN
1.0000 g | Freq: Once | INTRAVENOUS | Status: AC
  Administered 2022-02-11: 1 g via INTRAVENOUS
  Filled 2022-02-10: qty 10

## 2022-02-10 MED ORDER — SODIUM CHLORIDE 0.9 % IV SOLN: Freq: Once | INTRAVENOUS | Status: AC

## 2022-02-10 MED ORDER — VANCOMYCIN HCL 1500 MG/300ML IV SOLN
1500.0000 mg | Freq: Once | INTRAVENOUS | Status: AC
  Administered 2022-02-11: 1500 mg via INTRAVENOUS
  Filled 2022-02-10: qty 300

## 2022-02-10 MED ORDER — SODIUM CHLORIDE 0.9 % IV SOLN
2.0000 g | Freq: Once | INTRAVENOUS | Status: AC
  Administered 2022-02-11: 2 g via INTRAVENOUS
  Filled 2022-02-10: qty 2

## 2022-02-10 MED ORDER — SODIUM CHLORIDE 0.9 % IV BOLUS (SEPSIS)
1000.0000 mL | Freq: Once | INTRAVENOUS | Status: AC
  Administered 2022-02-11: 1000 mL via INTRAVENOUS

## 2022-02-10 MED ORDER — LEVOFLOXACIN IN D5W 500 MG/100ML IV SOLN
500.0000 mg | Freq: Once | INTRAVENOUS | Status: AC
  Administered 2022-02-10: 500 mg via INTRAVENOUS
  Filled 2022-02-10: qty 100

## 2022-02-10 MED ORDER — LACTATED RINGERS IV SOLN: INTRAVENOUS | Status: DC

## 2022-02-10 MED ORDER — VANCOMYCIN HCL IN DEXTROSE 1-5 GM/200ML-% IV SOLN: 1000.0000 mg | Freq: Once | INTRAVENOUS | Status: DC

## 2022-02-10 MED ORDER — METRONIDAZOLE 500 MG/100ML IV SOLN
500.0000 mg | Freq: Once | INTRAVENOUS | Status: AC
Start: 2022-02-10 — End: 2022-02-10
  Administered 2022-02-10: 500 mg via INTRAVENOUS
  Filled 2022-02-10: qty 100

## 2022-02-10 MED ORDER — VANCOMYCIN HCL 1000 MG/200ML IV SOLN
1000.0000 mg | Freq: Once | INTRAVENOUS | Status: DC
  Filled 2022-02-10: qty 200

## 2022-02-10 MED ORDER — VANCOMYCIN HCL IN DEXTROSE 1-5 GM/200ML-% IV SOLN
1000.0000 mg | Freq: Once | INTRAVENOUS | Status: DC
  Administered 2022-02-11: 1000 mg via INTRAVENOUS
  Filled 2022-02-10: qty 200

## 2022-02-10 MED ORDER — METRONIDAZOLE 500 MG/100ML IV SOLN
500.0000 mg | Freq: Once | INTRAVENOUS | Status: DC
Start: 2022-02-10 — End: 2022-02-10

## 2022-02-10 NOTE — Progress Notes (Signed)
PHARMACY -  BRIEF ANTIBIOTIC NOTE   Pharmacy has received consult(s) for Vancomycin, Cefepime from an ED provider.  The patient's profile has been reviewed for ht/wt/allergies/indication/available labs.    One time order(s) placed for Cefepime 2 gm IV X 1 and Vancomycin 2500 mg IV X 1   Further antibiotics/pharmacy consults should be ordered by admitting physician if indicated.                       Thank you, Damaria Vachon D 02/10/2022  11:17 PM

## 2022-02-10 NOTE — Sepsis Progress Note (Signed)
Elink following for "Sepsis Protocol,  st Lactic drawn at 19:36 with result of 3.0, Sepsis call ed at 22:49

## 2022-02-10 NOTE — ED Notes (Signed)
Unable to obtain IV access, IV Team Consult order placed and EDP Malinda notified of delay.

## 2022-02-10 NOTE — ED Notes (Signed)
Pt states that he has not urinated in 16+ hours and he has been drinking fluids "excessively."

## 2022-02-10 NOTE — ED Notes (Signed)
This RN attempted I/O cath x 1, no urine returned.  Bladder scanner resulted 58ml.  EDP Malinda verbally notified.

## 2022-02-10 NOTE — Progress Notes (Signed)
CODE SEPSIS - PHARMACY COMMUNICATION  **Broad Spectrum Antibiotics should be administered within 1 hour of Sepsis diagnosis**  Time Code Sepsis Called/Page Received: 2/13 @ 2249   Antibiotics Ordered: levaquin, metronidazole, cefepime , vanc  Time of 1st antibiotic administration: metronidazole 500 mg IV X 1 on 2/13 @ 2241   Additional action taken by pharmacy:   If necessary, Name of Provider/Nurse Contacted:     Alexey Rhoads D ,PharmD Clinical Pharmacist  02/10/2022  11:18 PM

## 2022-02-10 NOTE — ED Notes (Signed)
Critical lactic of 3.0 per lab, [place] notified verbally.

## 2022-02-10 NOTE — ED Notes (Signed)
Lab called critical Calcium of 5.6.  EDP Malinda notified.

## 2022-02-10 NOTE — ED Triage Notes (Signed)
Pt via EMS from home. Pt c/o diarrhea, loss of appetite. Denies pain. Pt is A&Ox4 and NAD.

## 2022-02-10 NOTE — Telephone Encounter (Signed)
I reviewed over triage note and reached back out to patient since it has been a hours since call to see if he called EMS. I spoke with patient on home number and stressed urgency to contact EMS given his symptoms, patient stated that once he would get off the phone with me he will contact EMS. Please see triage note below Dr. Caryn Section. KW

## 2022-02-10 NOTE — Telephone Encounter (Signed)
°  Chief Complaint: unable to stand or urinate  Symptoms: hx bedridden bariatric patient. Now unable to stand only sit at EOB. Had fever not now , swelling in bilateral legs, no sensation to urinate and has not urinated in 12 hours, dry mouth  Frequency: unable to stand since Saturday 02/08/22. Symptoms started since last Tuesday 02/04/22 Pertinent Negatives: Patient denies chest pain , difficulty breathing, no dizziness  Disposition: [x] ED /[] Urgent Care (no appt availability in office) / [] Appointment(In office/virtual)/ []  [facility]/ [] Home Care/ [] Refused Recommended Disposition /[] [facility]/ []  Follow-up with PCP Additional Notes:   Recommended patient to call 911 and get assist to stand and notify he is bariatric and unable to urinate in 12 hours.  Patient reports he will call 911 for assist. Please advise .   Reason for Disposition  Sounds like a life-threatening emergency to the triager  Answer Assessment - Initial Assessment Questions 1. ONSET: "When did the swelling start?" (e.g., minutes, hours, days)     Worsening swelling since Saturday 02/08/22 2. LOCATION: "What part of the leg is swollen?"  "Are both legs swollen or just one leg?"     Na  3. SEVERITY: "How bad is the swelling?" (e.g., localized; mild, moderate, severe)  - Localized - small area of swelling localized to one leg  - MILD pedal edema - swelling limited to foot and ankle, pitting edema < 1/4 inch (6 mm) deep, rest and elevation eliminate most or all swelling  - MODERATE edema - swelling of lower leg to knee, pitting edema > 1/4 inch (6 mm) deep, rest and elevation only partially reduce swelling  - SEVERE edema - swelling extends above knee, facial or hand swelling present      Unable to stand  hx bedridden, bariatric patient  4. REDNESS: "Does the swelling look red or infected?"     Unsure patent can not see 5. PAIN: "Is the swelling painful to touch?" If Yes, ask: "How painful is it?"    (Scale 1-10; mild, moderate or severe)     Na  6. FEVER: "Do you have a fever?" If Yes, ask: "What is it, how was it measured, and when did it start?"      Did have fever but not now  7. CAUSE: "What do you think is causing the leg swelling?"     Not sure was able to stand and step few steps prior to 02/08/22 but unable to stand now  8. MEDICAL HISTORY: "Do you have a history of heart failure, kidney disease, liver failure, or cancer?"     See hx obese bariatric patient  9. RECURRENT SYMPTOM: "Have you had leg swelling before?" If Yes, ask: "When was the last time?" "What happened that time?"     na 10. OTHER SYMPTOMS: "Do you have any other symptoms?" (e.g., chest pain, difficulty breathing)       No sensation to urinate, no urination in 12 hours. Dry mouth no dizziness 11. PREGNANCY: "Is there any chance you are pregnant?" "When was your last menstrual period?"       na  Protocols used: Leg Swelling and Edema-A-AH

## 2022-02-10 NOTE — ED Provider Notes (Addendum)
[facility] Provider Note    Event Date/Time   First MD Initiated Contact with Patient 02/10/22 1858     (approximate)   History   Diarrhea   HPI  Christopher Herman is a 46 y.o. male who reports severe nausea and diarrhea for the last several days.  He is getting lightheaded when he stands up.  Normally he can walk without difficulty but today he could not any longer because he is too lightheaded.  He has chronic lymphedema in his legs.      Physical Exam   Triage Vital Signs: ED Triage Vitals  Enc Vitals Group     BP --      Pulse Rate 02/10/22 1855 (!) 117     Resp 02/10/22 1855 (!) 23     Temp --      Temp src --      SpO2 02/10/22 1855 99 %     Weight 02/10/22 1856 (!) 600 lb (272.2 kg)     Height 02/10/22 1856 5\' 5"  (1.651 m)     Head Circumference --      Peak Flow --      Pain Score 02/10/22 1856 0     Pain Loc --      Pain Edu? --      Excl. in [place]? --     Most recent vital signs: Vitals:   02/10/22 2320 02/11/22 0030  BP: 102/62 106/69  Pulse: (!) 114 (!) 108  Resp: (!) 24 (!) 23  Temp:    SpO2: 100% 99%     General: Awake, no distress.  Breath smells fruity CV:  Good peripheral perfusion.  Heart regular rate and rhythm no audible murmurs tacky Resp:  Normal effort.  Lung sounds fairly distant but clear Abd:  No distention.  Abdomen soft nontender Extremities lymphedema in the upper legs which is chronic there is no edema in the feet though   ED Results / Procedures / Treatments   Labs (all labs ordered are listed, but only abnormal results are displayed) Labs Reviewed  BRAIN NATRIURETIC PEPTIDE - Abnormal; Notable for the following components:      Result Value   B Natriuretic Peptide 105.3 (*)    All other components within normal limits  COMPREHENSIVE METABOLIC PANEL - Abnormal; Notable for the following components:   Sodium 126 (*)    Chloride 89 (*)    CO2 14 (*)    BUN 29 (*)    Creatinine, Ser 3.03 (*)     Calcium 5.6 (*)    Total Protein 9.1 (*)    Albumin 2.6 (*)    AST 92 (*)    Total Bilirubin 2.1 (*)    GFR, Estimated 25 (*)    Anion gap 23 (*)    All other components within normal limits  LACTIC ACID, PLASMA - Abnormal; Notable for the following components:   Lactic Acid, Venous 3.0 (*)    All other components within normal limits  CBC WITH DIFFERENTIAL/PLATELET - Abnormal; Notable for the following components:   WBC 42.3 (*)    RBC 4.00 (*)    Hemoglobin 10.8 (*)    HCT 32.7 (*)    RDW 15.6 (*)    Platelets 728 (*)    Neutro Abs 37.0 (*)    Monocytes Absolute 1.4 (*)    Abs Immature Granulocytes 1.41 (*)    All other components within normal limits  BLOOD GAS, VENOUS - Abnormal;  Notable for the following components:   pCO2, Ven 27 (*)    pO2, Ven 47.0 (*)    Bicarbonate 13.6 (*)    Acid-base deficit 10.9 (*)    All other components within normal limits  CBG MONITORING, ED - Abnormal; Notable for the following components:   Glucose-Capillary 109 (*)    All other components within normal limits  GASTROINTESTINAL PANEL BY PCR, STOOL (REPLACES STOOL CULTURE)  C DIFFICILE QUICK SCREEN W PCR REFLEX    RESP PANEL BY RT-PCR (FLU A&B, COVID) [id number]  URINE CULTURE  CULTURE, BLOOD (ROUTINE X 2) W REFLEX TO ID PANEL  CULTURE, BLOOD (ROUTINE X 2) W REFLEX TO ID PANEL  LACTIC ACID, PLASMA  CBC WITH DIFFERENTIAL/PLATELET  URINALYSIS, COMPLETE (UACMP) WITH MICROSCOPIC  LACTIC ACID, PLASMA  CBC WITH DIFFERENTIAL/PLATELET  COMPREHENSIVE METABOLIC PANEL  PROTIME-INR  APTT  LACTIC ACID, PLASMA  BETA-HYDROXYBUTYRIC ACID  VOLATILES,BLD-ACETONE,ETHANOL,ISOPROP,METHANOL  TROPONIN I (HIGH SENSITIVITY)  TROPONIN I (HIGH SENSITIVITY)     EKG  EKG read interpreted by me shows sinus tachycardia rate of 127 normal axis there are some T wave flattening possible inversion in lead III flattening in aVF no other changes are seen   RADIOLOGY  Chest x-ray read by radiology reviewed by me  is negative  PROCEDURES:  Critical Care performed: Critical care time performed 45 minutes.  This includes reviewing the patient's old records.  I thought I had seen something about congestive heart failure in him but I cannot find it when I go back and look.  I discussed him in detail with ICU but his blood pressures come up and his heart rate is come down and I believe he can probably go to stepdown unit.  He is septic.  His calcium is low which we treated on the ICU as advised by giving him IV calcium.  His lactic acid is high and his white blood count is extremely high at 42,000 with 80% polys.  His H&H is dropped from last 1 from 2013.  He has AKI compared to the last labs we have from 2013 when he had normal renal function.  His BNP is slightly elevated today at 100.5 his troponin is 12 his lactic acid is 3 bicarb is low his sodium is 126 which is dangerously low and his anion gap is 23 possibly from the lactic acid and may be some starvation ketosis he does smell ketotic. He is not making any urine yet.  He has not had any diarrhea either at this point.  Procedures   MEDICATIONS ORDERED IN ED: Medications  vancomycin (VANCOREADY) IVPB 1500 mg/300 mL (has no administration in time range)  calcium gluconate 1 g/ 50 mL sodium chloride IVPB (1,000 mg Intravenous New Bag/Given 02/11/22 0010)  lactated ringers infusion (has no administration in time range)  vancomycin (VANCOCIN) IVPB 1000 mg/200 mL premix (has no administration in time range)  0.9 %  sodium chloride infusion (0 mLs Intravenous Stopped 02/10/22 2358)  levofloxacin (LEVAQUIN) IVPB 500 mg (0 mg Intravenous Stopped 02/10/22 2358)  metroNIDAZOLE (FLAGYL) IVPB 500 mg (0 mg Intravenous Stopped 02/10/22 2358)  0.9 %  sodium chloride infusion ( Intravenous New Bag/Given 02/11/22 0001)  calcium gluconate inj 10% (1 g) URGENT USE ONLY! (1 g Intravenous Given 02/11/22 0002)  sodium chloride 0.9 % bolus 1,000 mL (1,000 mLs Intravenous New  Bag/Given 02/11/22 0000)  ceFEPIme (MAXIPIME) 2 g in sodium chloride 0.9 % 100 mL IVPB (0 g Intravenous Stopped 02/11/22 0045)  IMPRESSION / MDM / ASSESSMENT AND PLAN / ED COURSE  I reviewed the triage vital signs and the nursing notes. Had a delay getting IVs going because it was a difficult stick.  We now have the IVs going.  I am ordering normal saline 2 L but only 500 cc an hour as he has a history of CHF I do not want to make him hypoxic by flooding his lungs. ----------------------------------------- 10:10 PM on 02/10/2022 ----------------------------------------- CBC has come back showing 42,000 white count this is extremely worrisome.  I ordered Levaquin and Flagyl.  This should treat most intra-abdominal processes.  I have ordered a stool specimen as he has not had a stool yet.  He has not had any urine in his bladder when we cathed him nor did the bladder scan.  The patient is on the cardiac monitor to evaluate for evidence of arrhythmia and/or significant heart rate changes.  ----------------------------------------- 10:51 PM on 02/10/2022 ----------------------------------------- Have discussed patient with ICU.  And giving sepsis bolus of saline now since his sodium is still low.  ICU wants me to try to get an ultrasound which I have ordered, to give to grams of calcium IV which I have ordered and see how he does after the fluids possibly he will need to go to ICU. ----------------------------------------- 12:49 AM on 02/11/2022 ----------------------------------------- Patient is getting fluids his heart rate has come down and his blood pressures come up.  He looks somewhat better.  We attempted to logroll him and were able to look at most of his back and sides and we do not see any sign of cellulitis.  He does have fairly wet area in his pannus.  There is a little bit of redness there but not much.  There is no obvious skin breakdown.  I think the source of his apparent sepsis  this is diarrhea.  We have given him antibiotics for this.  We will plan on getting him admitted to the hospital.  Ultrasound is still pending.  I am going to sign out his final disposition to the oncoming physician. Patient is more anemic than he was in 2013.  He did not report any bleeding from his rectum.  We attempted to roll him first went away and then the other but could not get him over far enough for me to be able to reach his rectum.  In a bigger bed it may be possible but in this bed we risk having him fall out of the bed if we try further.   FINAL CLINICAL IMPRESSION(S) / ED DIAGNOSES   Final diagnoses:  Sepsis, due to unspecified organism, unspecified whether acute organ dysfunction present ([place])  AKI (acute kidney injury) ([place])  Diarrhea, unspecified type  Nausea     Rx / DC Orders   ED Discharge Orders     None        Note:  This document was prepared using Dragon voice recognition software and may include unintentional dictation errors.   Nena Polio, MD 02/11/22 [id number]    Nena Polio, MD 02/11/22 [phone]

## 2022-02-11 ENCOUNTER — Emergency Department: Payer: BC Managed Care – PPO

## 2022-02-11 DIAGNOSIS — R110 Nausea: Secondary | ICD-10-CM

## 2022-02-11 DIAGNOSIS — I959 Hypotension, unspecified: Secondary | ICD-10-CM | POA: Diagnosis not present

## 2022-02-11 DIAGNOSIS — M6282 Rhabdomyolysis: Secondary | ICD-10-CM | POA: Diagnosis present

## 2022-02-11 DIAGNOSIS — E878 Other disorders of electrolyte and fluid balance, not elsewhere classified: Secondary | ICD-10-CM | POA: Diagnosis not present

## 2022-02-11 DIAGNOSIS — Z20822 Contact with and (suspected) exposure to covid-19: Secondary | ICD-10-CM | POA: Diagnosis present

## 2022-02-11 DIAGNOSIS — D1720 Benign lipomatous neoplasm of skin and subcutaneous tissue of unspecified limb: Secondary | ICD-10-CM | POA: Diagnosis present

## 2022-02-11 DIAGNOSIS — K219 Gastro-esophageal reflux disease without esophagitis: Secondary | ICD-10-CM | POA: Diagnosis present

## 2022-02-11 DIAGNOSIS — A419 Sepsis, unspecified organism: Secondary | ICD-10-CM | POA: Diagnosis present

## 2022-02-11 DIAGNOSIS — E8809 Other disorders of plasma-protein metabolism, not elsewhere classified: Secondary | ICD-10-CM | POA: Diagnosis present

## 2022-02-11 DIAGNOSIS — G4733 Obstructive sleep apnea (adult) (pediatric): Secondary | ICD-10-CM | POA: Diagnosis present

## 2022-02-11 DIAGNOSIS — E876 Hypokalemia: Secondary | ICD-10-CM | POA: Diagnosis not present

## 2022-02-11 DIAGNOSIS — E872 Acidosis, unspecified: Secondary | ICD-10-CM

## 2022-02-11 DIAGNOSIS — Z6841 Body Mass Index (BMI) 40.0 and over, adult: Secondary | ICD-10-CM | POA: Diagnosis not present

## 2022-02-11 DIAGNOSIS — R197 Diarrhea, unspecified: Secondary | ICD-10-CM | POA: Diagnosis present

## 2022-02-11 DIAGNOSIS — I890 Lymphedema, not elsewhere classified: Secondary | ICD-10-CM | POA: Diagnosis present

## 2022-02-11 DIAGNOSIS — D75839 Thrombocytosis, unspecified: Secondary | ICD-10-CM | POA: Diagnosis present

## 2022-02-11 DIAGNOSIS — N179 Acute kidney failure, unspecified: Secondary | ICD-10-CM | POA: Diagnosis present

## 2022-02-11 DIAGNOSIS — D649 Anemia, unspecified: Secondary | ICD-10-CM | POA: Diagnosis present

## 2022-02-11 DIAGNOSIS — E871 Hypo-osmolality and hyponatremia: Secondary | ICD-10-CM | POA: Diagnosis present

## 2022-02-11 DIAGNOSIS — Z95828 Presence of other vascular implants and grafts: Secondary | ICD-10-CM | POA: Diagnosis not present

## 2022-02-11 DIAGNOSIS — L89890 Pressure ulcer of other site, unstageable: Secondary | ICD-10-CM | POA: Diagnosis present

## 2022-02-11 DIAGNOSIS — E861 Hypovolemia: Secondary | ICD-10-CM | POA: Diagnosis present

## 2022-02-11 DIAGNOSIS — I10 Essential (primary) hypertension: Secondary | ICD-10-CM | POA: Diagnosis present

## 2022-02-11 DIAGNOSIS — E559 Vitamin D deficiency, unspecified: Secondary | ICD-10-CM | POA: Diagnosis not present

## 2022-02-11 DIAGNOSIS — L089 Local infection of the skin and subcutaneous tissue, unspecified: Secondary | ICD-10-CM | POA: Diagnosis present

## 2022-02-11 LAB — CBC WITH DIFFERENTIAL/PLATELET
Abs Immature Granulocytes: 0.59 10*3/uL — ABNORMAL HIGH (ref 0.00–0.07)
Basophils Absolute: 0.1 10*3/uL (ref 0.0–0.1)
Basophils Relative: 0 %
Eosinophils Absolute: 0 10*3/uL (ref 0.0–0.5)
Eosinophils Relative: 0 %
HCT: 31.4 % — ABNORMAL LOW (ref 39.0–52.0)
Hemoglobin: 10.7 g/dL — ABNORMAL LOW (ref 13.0–17.0)
Immature Granulocytes: 2 %
Lymphocytes Relative: 6 %
Lymphs Abs: 1.9 10*3/uL (ref 0.7–4.0)
MCH: 26.6 pg (ref 26.0–34.0)
MCHC: 34.1 g/dL (ref 30.0–36.0)
MCV: 77.9 fL — ABNORMAL LOW (ref 80.0–100.0)
Monocytes Absolute: 1.4 10*3/uL — ABNORMAL HIGH (ref 0.1–1.0)
Monocytes Relative: 4 %
Neutro Abs: 27.2 10*3/uL — ABNORMAL HIGH (ref 1.7–7.7)
Neutrophils Relative %: 88 %
Platelets: 530 10*3/uL — ABNORMAL HIGH (ref 150–400)
RBC: 4.03 MIL/uL — ABNORMAL LOW (ref 4.22–5.81)
RDW: 15.4 % (ref 11.5–15.5)
Smear Review: NORMAL
WBC: 31 10*3/uL — ABNORMAL HIGH (ref 4.0–10.5)
nRBC: 0 % (ref 0.0–0.2)

## 2022-02-11 LAB — MRSA NEXT GEN BY PCR, NASAL: MRSA by PCR Next Gen: NOT DETECTED

## 2022-02-11 LAB — COMPREHENSIVE METABOLIC PANEL
ALT: 23 U/L (ref 0–44)
AST: 95 U/L — ABNORMAL HIGH (ref 15–41)
Albumin: 2 g/dL — ABNORMAL LOW (ref 3.5–5.0)
Alkaline Phosphatase: 77 U/L (ref 38–126)
Anion gap: 19 — ABNORMAL HIGH (ref 5–15)
BUN: 30 mg/dL — ABNORMAL HIGH (ref 6–20)
CO2: 15 mmol/L — ABNORMAL LOW (ref 22–32)
Calcium: 5.3 mg/dL — CL (ref 8.9–10.3)
Chloride: 95 mmol/L — ABNORMAL LOW (ref 98–111)
Creatinine, Ser: 2.54 mg/dL — ABNORMAL HIGH (ref 0.61–1.24)
GFR, Estimated: 31 mL/min — ABNORMAL LOW (ref >60)
Glucose, Bld: 114 mg/dL — ABNORMAL HIGH (ref 70–99)
Potassium: 3.7 mmol/L (ref 3.5–5.1)
Sodium: 129 mmol/L — ABNORMAL LOW (ref 135–145)
Total Bilirubin: 1.8 mg/dL — ABNORMAL HIGH (ref 0.3–1.2)
Total Protein: 6.8 g/dL (ref 6.5–8.1)

## 2022-02-11 LAB — URINALYSIS, COMPLETE (UACMP) WITH MICROSCOPIC
Bilirubin Urine: NEGATIVE
Glucose, UA: NEGATIVE mg/dL
Ketones, ur: 5 mg/dL — AB
Leukocytes,Ua: NEGATIVE
Nitrite: NEGATIVE
Protein, ur: 30 mg/dL — AB
Specific Gravity, Urine: 1.02 (ref 1.005–1.030)
pH: 5 (ref 5.0–8.0)

## 2022-02-11 LAB — CORTISOL-AM, BLOOD: Cortisol - AM: 24.5 ug/dL — ABNORMAL HIGH (ref 6.7–22.6)

## 2022-02-11 LAB — BETA-HYDROXYBUTYRIC ACID: Beta-Hydroxybutyric Acid: 4.34 mmol/L — ABNORMAL HIGH (ref 0.05–0.27)

## 2022-02-11 LAB — RESP PANEL BY RT-PCR (FLU A&B, COVID) ARPGX2
Influenza A by PCR: NEGATIVE
Influenza B by PCR: NEGATIVE
SARS Coronavirus 2 by RT PCR: NEGATIVE

## 2022-02-11 LAB — HIV ANTIBODY (ROUTINE TESTING W REFLEX): HIV Screen 4th Generation wRfx: NONREACTIVE

## 2022-02-11 LAB — LACTIC ACID, PLASMA
Lactic Acid, Venous: 1.9 mmol/L (ref 0.5–1.9)
Lactic Acid, Venous: 1.7 mmol/L (ref 0.5–1.9)

## 2022-02-11 LAB — TROPONIN I (HIGH SENSITIVITY): Troponin I (High Sensitivity): 12 ng/L (ref <18)

## 2022-02-11 LAB — PROTIME-INR
INR: 1.5 — ABNORMAL HIGH (ref 0.8–1.2)
Prothrombin Time: 17.6 seconds — ABNORMAL HIGH (ref 11.4–15.2)

## 2022-02-11 LAB — PROCALCITONIN: Procalcitonin: 5.62 ng/mL

## 2022-02-11 LAB — APTT: aPTT: 32 seconds (ref 24–36)

## 2022-02-11 MED ORDER — ENOXAPARIN SODIUM 150 MG/ML IJ SOSY
0.5000 mg/kg | PREFILLED_SYRINGE | INTRAMUSCULAR | Status: DC
  Administered 2022-02-11 – 2022-02-17 (×7): 135 mg via SUBCUTANEOUS
  Filled 2022-02-11 (×7): qty 0.9

## 2022-02-11 MED ORDER — CALCIUM GLUCONATE 10 % IV SOLN: 1.0000 g | Freq: Once | INTRAVENOUS | Status: DC

## 2022-02-11 MED ORDER — VANCOMYCIN HCL 1000 MG/200ML IV SOLN
1000.0000 mg | Freq: Once | INTRAVENOUS | Status: DC
  Filled 2022-02-11: qty 200

## 2022-02-11 MED ORDER — VANCOMYCIN VARIABLE DOSE PER UNSTABLE RENAL FUNCTION (PHARMACIST DOSING): Status: DC

## 2022-02-11 MED ORDER — TRAZODONE HCL 50 MG PO TABS
25.0000 mg | ORAL_TABLET | Freq: Every evening | ORAL | Status: DC | PRN
  Administered 2022-02-14 – 2022-02-15 (×2): 25 mg via ORAL
  Filled 2022-02-11 (×2): qty 1
  Filled 2022-02-11: qty 0.5
  Filled 2022-02-11: qty 1
  Filled 2022-02-11: qty 0.5

## 2022-02-11 MED ORDER — SODIUM CHLORIDE 0.9 % IV SOLN: INTRAVENOUS | Status: DC

## 2022-02-11 MED ORDER — METRONIDAZOLE 500 MG/100ML IV SOLN
500.0000 mg | Freq: Two times a day (BID) | INTRAVENOUS | Status: DC
  Administered 2022-02-11: 500 mg via INTRAVENOUS
  Filled 2022-02-11: qty 100

## 2022-02-11 MED ORDER — MAGNESIUM HYDROXIDE 400 MG/5ML PO SUSP: 30.0000 mL | Freq: Every day | ORAL | Status: DC | PRN

## 2022-02-11 MED ORDER — SODIUM CHLORIDE 0.9 % IV SOLN
2.0000 g | Freq: Three times a day (TID) | INTRAVENOUS | Status: DC
  Administered 2022-02-11: 2 g via INTRAVENOUS
  Filled 2022-02-11 (×3): qty 2

## 2022-02-11 MED ORDER — ONDANSETRON HCL 4 MG/2ML IJ SOLN: 4.0000 mg | Freq: Four times a day (QID) | INTRAMUSCULAR | Status: DC | PRN

## 2022-02-11 MED ORDER — CALCIUM GLUCONATE-NACL 1-0.675 GM/50ML-% IV SOLN
1.0000 g | Freq: Once | INTRAVENOUS | Status: AC
Start: 2022-02-11 — End: 2022-02-11
  Administered 2022-02-11: 1000 mg via INTRAVENOUS
  Filled 2022-02-11: qty 50

## 2022-02-11 MED ORDER — VANCOMYCIN HCL IN DEXTROSE 1-5 GM/200ML-% IV SOLN: 1000.0000 mg | Freq: Once | INTRAVENOUS | Status: DC

## 2022-02-11 MED ORDER — ACETAMINOPHEN 650 MG RE SUPP: 650.0000 mg | Freq: Four times a day (QID) | RECTAL | Status: DC | PRN

## 2022-02-11 MED ORDER — SODIUM CHLORIDE 0.9 % IV BOLUS
1000.0000 mL | Freq: Once | INTRAVENOUS | Status: AC
  Administered 2022-02-11: 1000 mL via INTRAVENOUS

## 2022-02-11 MED ORDER — OYSTER SHELL CALCIUM/D3 500-5 MG-MCG PO TABS
1.0000 | ORAL_TABLET | Freq: Three times a day (TID) | ORAL | Status: DC
  Administered 2022-02-11 – 2022-02-13 (×7): 1 via ORAL
  Filled 2022-02-11 (×7): qty 1

## 2022-02-11 MED ORDER — SODIUM CHLORIDE 0.9 % IV SOLN
2.0000 g | INTRAVENOUS | Status: DC
  Administered 2022-02-11 – 2022-02-16 (×6): 2 g via INTRAVENOUS
  Filled 2022-02-11: qty 2
  Filled 2022-02-11: qty 20
  Filled 2022-02-11: qty 2
  Filled 2022-02-11 (×4): qty 20

## 2022-02-11 MED ORDER — ACETAMINOPHEN 325 MG PO TABS: 650.0000 mg | ORAL_TABLET | Freq: Four times a day (QID) | ORAL | Status: DC | PRN

## 2022-02-11 MED ORDER — STERILE WATER FOR INJECTION IV SOLN
INTRAVENOUS | Status: DC
  Filled 2022-02-11: qty 150
  Filled 2022-02-11: qty 1000
  Filled 2022-02-11 (×2): qty 150
  Filled 2022-02-11: qty 1000
  Filled 2022-02-11: qty 150
  Filled 2022-02-11: qty 1000
  Filled 2022-02-11: qty 150
  Filled 2022-02-11: qty 1000

## 2022-02-11 MED ORDER — ONDANSETRON HCL 4 MG PO TABS: 4.0000 mg | ORAL_TABLET | Freq: Four times a day (QID) | ORAL | Status: DC | PRN

## 2022-02-11 NOTE — Progress Notes (Signed)
Pharmacy Antibiotic Note  Christopher Herman is a 46 y.o. male admitted on 02/10/2022 with sepsis.  Pharmacy has been consulted for Vanc, cefepime dosing.  Pt appears to have AKI ; SrCr today is 3.03 vs SrCr 0.89 in 01/2012, no mention of ESRD in profile.    Plan: Cefepime 2 gm IV X 1 given in ED on 2/14 @ 0013. Cefepime 2 gm IV Q8H ordered to continue on 2/14 @ 0800.  Vancomycin 1500 mg IV X 1 given in ED on 2/14 @ 0052. Additional Vanc 1 gm IV X 1 ordered to make total loading dose of 2500 mg.  Pt appears to be have AKI so will dose by levels until renal function is stable.  Vanc random 2/15 @ 0000.   Height: 5\' 5"  (165.1 cm) Weight: (!) 272.2 kg (600 lb) IBW/kg (Calculated) : 61.5  Temp (24hrs), Avg:98.1 F (36.7 C), Min:98.1 F (36.7 C), Max:98.1 F (36.7 C)  Recent Labs  Lab 02/10/22 1936  WBC 42.3*  CREATININE 3.03*  LATICACIDVEN 3.0*    Estimated Creatinine Clearance: 64.2 mL/min (A) (by C-G formula based on SCr of 3.03 mg/dL (H)).    No Known Allergies  Antimicrobials this admission:   >>    >>   Dose adjustments this admission:   Microbiology results:  BCx:   UCx:    Sputum:    MRSA PCR:   Thank you for allowing pharmacy to be a part of this patients care.  Faraaz Wolin D 02/11/2022 2:36 AM

## 2022-02-11 NOTE — Progress Notes (Signed)
Pt. Refuses cpap and states that he hasn't used a cpap machine since 2013.

## 2022-02-11 NOTE — ED Notes (Signed)
U/S in room at this time. 

## 2022-02-11 NOTE — Progress Notes (Signed)
No charge progress note.  Christopher Herman is a 46 y.o. male with medical history significant for severe obesity with BMI of 99.85, obstructive sleep apnea, essential hypertension, GERD, cholelithiasis, bilateral lower extremity lipoma, status post IVC filter and chronic lymphedema, who presented to the ER with acute onset of diarrhea that occurs on Saturday as well as nausea without vomiting and significantly diminished appetite.   On arrival he was afebrile with significant leukocytosis, tachycardia and tachypnea meeting sepsis definition.  Elevated lactic acid at 3 which has been normalized.  Received appropriate antibiotics and IV fluid.  Apparently blood and urine cultures were ordered which was never sent until was seen this morning.  Contacted the lab myself and they were only able to get 1 blood culture. He was already on broad-spectrum antibiotics. UA and urine cultures are still pending-Per nursing staff they were able to send it to the lab but it is showing needs to be collected??  On exam morbidly obese gentleman.  Stating that he is feeling improved with some improvement in his appetite today.  Father at bedside.  We will de-escalate antibiotics to ceftriaxone as some concern of UTI. Discontinuing cefepime, vancomycin and Flagyl and starting him on ceftriaxone. Follow-up cultures.

## 2022-02-11 NOTE — Sepsis Progress Note (Signed)
Spoke with bedside RN, pt is a very difficult stick, unable to attain 2nd lactic or blood cultures therefore Abx's were not delayed

## 2022-02-11 NOTE — H&P (Signed)
#: #:     [employer]   PATIENT NAME: Christopher Herman    MR#:  [id number]  DATE OF BIRTH:  [birth year]  DATE OF ADMISSION:  02/10/2022  PRIMARY CARE PHYSICIAN: Birdie Sons, MD   Patient is coming from: Home  REQUESTING/REFERRING PHYSICIAN: Conni Slipper, MD CHIEF COMPLAINT:   Chief Complaint  Patient presents with   Diarrhea    HISTORY OF PRESENT ILLNESS:  Christopher Herman is a 46 y.o. male with medical history significant for severe obesity with BMI of 99.85, obstructive sleep apnea, essential hypertension, GERD, cholelithiasis, bilateral lower extremity lipoma, status post IVC filter and chronic lymphedema, who presented to the ER with acute onset of diarrhea that occurs on Saturday as well as nausea without vomiting and significantly diminished appetite.  He stated that he lost sensation to urinate for the last 17 hours before his presentation.  Prior to that he admitted to urinary frequency and urgency without hematuria or flank pain.  He has been feeling generalized weakness and fatigue.  No chest pain or dyspnea or cough or wheezing.  No recent travels or surgeries.  No bleeding diathesis.  ED Course: When he came to the ER heart rate was 117 and respiratory rate 23 with a BP of 108/61 and temperature 98.1 and pulse symmetry 99% on room air.   BP then dropped to 88/69 and with hydration came up to 102/62.    VBG showed pH 7.31 and HCO3 of 13.6.  CMP revealed a sodium of 126 and chloride of 89 with a CO2 of 14 and a BUN of 29 with creatinine 3.03, calcium 5.6 and albumin 2.6 with troponin of 9.1.  Total bili was 2.1.  AST was 92 and ALT 26.  BNP was 105.3 and high-sensitivity troponin I was 12.  CBC showed remarkable leukocytosis of 42.3 with neutrophilia as well as anemia and thrombocytosis.  Lactic acid was 3.  Influenza antigens and COVID-19 PCR came back negative.                                 EKG as reviewed by me : EKG showed sinus tachycardia with rate 127 with PACs with  borderline prolonged QT interval. Imaging: Portable chest ray showed no acute cardiopulmonary disease and low lung volumes.  Abdominal ultrasound revealed cholelithiasis without sonographic evidence of acute cholecystitis.  The patient was given IV cefepime, vancomycin and Flagyl as well as IV Levaquin, lactated ringer at 150 mill per hour after 2 L bolus of IV normal saline as well as 1 g of IV calcium gluconate.  He is admitted to a progressive unit bed for further evaluation and management. PAST MEDICAL HISTORY:   Past Medical History:  Diagnosis Date   Helicobacter pylori infection 09/10/2015   Palpitations 05/31/2009   Chronic, stable  severe obesity with BMI of 99.85 obstructive sleep apnea, essential hypertension, GERD, cholelithiasis, bilateral lower extremity lipoma, status post IVC filter and chronic lymphedema  PAST SURGICAL HISTORY:   Past Surgical History:  Procedure Laterality Date   GASTRIC BYPASS  05/23/2012    SOCIAL HISTORY:   Social History   Tobacco Use   Smoking status: Former   Smokeless tobacco: Never   Tobacco comments:    Quit smoking in 2010, smoked cigarettes, smoked for about 16 years; smoked less than 1/2 pack per day.  Substance Use Topics   Alcohol use: No    FAMILY HISTORY:  Positive for diabetes mellitus, CVA, MI and hypertension.  DRUG ALLERGIES:  No Known Allergies  REVIEW OF SYSTEMS:   ROS As per history of present illness. All pertinent systems were reviewed above. Constitutional, HEENT, cardiovascular, respiratory, GI, GU, musculoskeletal, neuro, psychiatric, endocrine, integumentary and hematologic systems were reviewed and are otherwise negative/unremarkable except for positive findings mentioned above in the HPI.   MEDICATIONS AT HOME:   Prior to Admission medications   Medication Sig Start Date End Date Taking? Authorizing Provider  furosemide (LASIX) 40 MG tablet Take 1 tablet (40 mg total) by mouth daily as needed. Take one  tablet daily Patient not taking: Reported on 02/10/2022 03/27/20   Birdie Sons, MD  meloxicam [facility]) 15 MG tablet take 1 tablet by mouth once daily with food Patient not taking: Reported on 02/10/2022 09/13/18   Birdie Sons, MD  nizatidine (AXID) 300 MG capsule Take 1 capsule (300 mg total) by mouth 2 (two) times daily as needed for heartburn. Patient not taking: Reported on 02/10/2022 01/27/20   Birdie Sons, MD  traMADol (ULTRAM) 50 MG tablet Take 1 tablet by mouth every 8 (eight) hours as needed. 1-2 Patient not taking: Reported on 02/10/2022 03/05/15   [provider]      VITAL SIGNS:  Blood pressure 106/69, pulse (!) 108, temperature 98.1 F (36.7 C), temperature source Oral, resp. rate (!) 23, height 5\' 5"  (1.651 m), weight (!) 272.2 kg, SpO2 99 %.  PHYSICAL EXAMINATION:  Physical Exam  GENERAL:  46 y.o.-year-old morbidly obese African-American male patient lying in the bed with no acute distress.  EYES: Pupils equal, round, reactive to light and accommodation. No scleral icterus. Extraocular muscles intact.  HEENT: Head atraumatic, normocephalic. Oropharynx and nasopharynx clear.  NECK:  Supple, no jugular venous distention. No thyroid enlargement, no tenderness.  LUNGS: Normal breath sounds bilaterally, no wheezing, rales,rhonchi or crepitation. No use of accessory muscles of respiration.  CARDIOVASCULAR: Regular rate and rhythm, S1, S2 normal. No murmurs, rubs, or gallops.  ABDOMEN: Soft, nondistended, nontender. Bowel sounds present. No organomegaly or mass.  EXTREMITIES: Chronic lymphedema with lichenification and elephantiasis with no cyanosis, or clubbing.  He has bilateral medial thigh large lipomas shown below. NEUROLOGIC: Cranial nerves II through XII are intact. Muscle strength 5/5 in all extremities. Sensation intact. Gait not checked.  PSYCHIATRIC: The patient is alert and oriented x 3.  Normal affect and good eye contact. SKIN: No obvious rash, lesion,  or ulcer.    LABORATORY PANEL:   CBC Recent Labs  Lab 02/10/22 1936  WBC 42.3*  HGB 10.8*  HCT 32.7*  PLT 728*   ------------------------------------------------------------------------------------------------------------------  Chemistries  Recent Labs  Lab 02/10/22 1936  NA 126*  K 4.1  CL 89*  CO2 14*  GLUCOSE 96  BUN 29*  CREATININE 3.03*  CALCIUM 5.6*  AST 92*  ALT 26  ALKPHOS 105  BILITOT 2.1*   ------------------------------------------------------------------------------------------------------------------  Cardiac Enzymes No results for input(s): TROPONINI in the last 168 hours. ------------------------------------------------------------------------------------------------------------------  RADIOLOGY:  US Abdomen Complete  Result Date: 02/11/2022 CLINICAL DATA:  Sepsis. EXAM: ABDOMEN ULTRASOUND COMPLETE COMPARISON:  None. FINDINGS: Gallbladder: Gallstone. No gallbladder wall thickening or pericholecystic fluid. Negative sonographic Murphy's sign. Common bile duct: Diameter: 3 mm Liver: The liver is enlarged measuring 25 cm in craniocaudal length. Portal vein is patent on color Doppler imaging with normal direction of blood flow towards the liver. IVC: No abnormality visualized. Pancreas: Not visualized and obscured by bowel gas. Spleen: Size and appearance  within normal limits. Right Kidney: Length: 12.5 cm. Normal echogenicity. No hydronephrosis or shadowing stone. Left Kidney: Length: 13.1 cm. Normal echogenicity. No hydronephrosis or shadowing stone. Abdominal aorta: No aneurysm visualized. Other findings: None. IMPRESSION: 1. Cholelithiasis without sonographic evidence of acute cholecystitis. 2. Hepatomegaly. Electronically Signed   By: Anner Crete M.D.   On: 02/11/2022 01:35   DG Chest Port 1 View  Result Date: 02/10/2022 CLINICAL DATA:  Questionable sepsis EXAM: PORTABLE CHEST 1 VIEW COMPARISON:  02/26/2012 FINDINGS: Low lung volumes. Heart and  mediastinal contours are within normal limits. No focal opacities or effusions. No acute bony abnormality. AP lordotic nature of the study accentuates basilar markings. IMPRESSION: No active cardiopulmonary disease.  Low lung volumes. Electronically Signed   By: Rolm Baptise M.D.   On: 02/10/2022 23:51      IMPRESSION AND PLAN:  Principal Problem:   Sepsis ([place])  1.  Sepsis of questionable etiology.  I highly suspect urinary tract source.  Sepsis manifested by remarkable leukocytosis and leukemoid reaction, tachycardia and tachypnea.  He has a lactic acid of 3 meeting severe sepsis criteria. - The patient be admitted to a progressive unit bed. - We will continue hydration with IV normal saline. - We will continue antibiotic therapy with IV cefepime, vancomycin and Flagyl. - We will follow lactic acid level.  2.  Nausea and diarrhea, possibly related to acute gastroenteritis. - The patient will be placed on hydration with IV normal saline as mentioned above. - We will check stool pathogens that were ordered in the ER. - She has not been on any recent antibiotics and has not had any abdominal pain and therefore I will defer C. difficile at this time.  3.  Acute kidney injury, likely hypovolemic. - He will be hydrated with IV normal saline and will follow his BMP.  4.  Metabolic acidosis. - We will place him on IV bicarbonate drip.  5.  Hypocalcemia. - He received IV calcium gluconate. - We will continue with Os-Cal plus vitamin D.  DVT prophylaxis: Lovenox. Advanced Care Planning:  Code Status: full code. Family Communication:  The plan of care was discussed in details with the patient (and family). I answered all questions. The patient agreed to proceed with the above mentioned plan. Further management will depend upon hospital course. Disposition Plan: Back to previous home environment Consults called: none. All the records are reviewed and case discussed with ED  provider.  Status is: Inpatient  At the time of the admission, it appears that the appropriate admission status for this patient is inpatient.  This is judged to be reasonable and necessary in order to provide the required intensity of service to ensure the patient's safety given the presenting symptoms, physical exam findings and initial radiographic and laboratory data in the context of comorbid conditions.  The patient requires inpatient status due to high intensity of service, high risk of further deterioration and high frequency of surveillance required.  I certify that at the time of admission, it is my clinical judgment that the patient will require inpatient hospital care extending more than 2 midnights.                            Dispo: The patient is from: Home              Anticipated d/c is to: Home              Patient currently  is not medically stable to d/c.              Difficult to place patient: No   Christel Mormon M.D on 02/11/2022 at 2:28 AM  [facility]   From 7 PM-7 AM, contact night-coverage [email or web address]  CC: Primary care physician; Birdie Sons, MD C

## 2022-02-11 NOTE — Progress Notes (Signed)
Anticoagulation monitoring(Lovenox):  46 yo male ordered Lovenox 40 mg Q24h    Filed Weights   02/10/22 1856  Weight: (!) 272.2 kg (600 lb)   BMI 99.85   Lab Results  Component Value Date   CREATININE 3.03 (H) 02/10/2022   CREATININE 0.89 02/11/2012   Estimated Creatinine Clearance: 64.2 mL/min (A) (by C-G formula based on SCr of 3.03 mg/dL (H)). Hemoglobin & Hematocrit     Component Value Date/Time   HGB 10.8 (L) 02/10/2022 1936   HGB 12.8 (L) 02/11/2012 1227   HCT 32.7 (L) 02/10/2022 1936   HCT 39.3 (L) 02/11/2012 1227     Per Protocol for Patient with estCrcl > 30 ml/min and BMI > 30, will transition to Lovenox 135 mg Q24h.

## 2022-02-12 DIAGNOSIS — N179 Acute kidney failure, unspecified: Secondary | ICD-10-CM | POA: Diagnosis not present

## 2022-02-12 DIAGNOSIS — A419 Sepsis, unspecified organism: Principal | ICD-10-CM

## 2022-02-12 DIAGNOSIS — E559 Vitamin D deficiency, unspecified: Secondary | ICD-10-CM

## 2022-02-12 LAB — VOLATILES,BLD-ACETONE,ETHANOL,ISOPROP,METHANOL
Acetone, blood: 0.011 g/dL — ABNORMAL HIGH (ref 0.000–0.010)
Ethanol, blood: <0.01 g/dL (ref 0.000–0.010)
Isopropanol, blood: <0.01 g/dL (ref 0.000–0.010)
Methanol, blood: <0.01 g/dL (ref 0.000–0.010)

## 2022-02-12 LAB — HEPARIN ANTI-XA: Heparin LMW: 0.18 IU/mL

## 2022-02-12 MED ORDER — SENNOSIDES-DOCUSATE SODIUM 8.6-50 MG PO TABS
2.0000 | ORAL_TABLET | Freq: Two times a day (BID) | ORAL | Status: DC
  Administered 2022-02-12: 2 via ORAL
  Filled 2022-02-12: qty 2

## 2022-02-12 MED ORDER — POLYETHYLENE GLYCOL 3350 17 G PO PACK: 17.0000 g | PACK | Freq: Every day | ORAL | Status: DC

## 2022-02-12 NOTE — Assessment & Plan Note (Signed)
Hypocalcemia -check PTH

## 2022-02-12 NOTE — Progress Notes (Signed)
°  °  Durable Medical Equipment  (From admission, onward)           Start     Ordered   02/12/22 1334  For home use only DME Hospital bed  Once       Question Answer Comment  Length of Need Lifetime   Bed type Heavy-duty, semi-electric (for patients >350 lbs.)      02/12/22 1333   02/12/22 1334  For home use only DME wheelchair cushion (seat and back)  Once       Comments: Bariatric   02/12/22 [address], [place]

## 2022-02-12 NOTE — Assessment & Plan Note (Signed)
Likely prerenal with nausea vomiting diarrhea and poor p.o. intake.  Continue IV hydration and monitor renal function

## 2022-02-12 NOTE — Assessment & Plan Note (Addendum)
No clear source.  Possible urine Continue IV Rocephin for now.  ID consult He denies any vomiting or diarrhea while in the hospital

## 2022-02-12 NOTE — TOC Initial Note (Signed)
Transition of Care [facility]) - Initial/Assessment Note    Patient Details  Name: Christopher Herman MRN: [id number] Date of Birth: [birth year]  [facility] CM/SW Contact:    Alberteen Sam, LCSW Phone Number: 02/12/2022, 1:40 PM  Clinical Narrative:                  CSW spoke with patient regarding requested DME needs.   Patient reports needing a wheel chair and hospital bed, no further dc needs.   CSW has connected with Zach with [employer], wheel chair will be delivered to the home in addition to the hospital bed, as patient will be transported home via ems at time of discharge.   [employer] aware to reach out to patient via his mobile phone number at [phone] regarding ETA of hospital bed delivery to ensure someone is at home to receive the bed.   TOC will continue to follow for any additional needs.   Expected Discharge Plan: Home/Self Care Barriers to Discharge: Continued Medical Work up   Patient Goals and CMS Choice Patient states their goals for this hospitalization and ongoing recovery are:: to go home CMS Medicare.gov Compare Post Acute Care list provided to:: Patient Choice offered to / list presented to : Patient  Expected Discharge Plan and Services Expected Discharge Plan: Home/Self Care       Living arrangements for the past 2 months: Single Family Home                 DME Arranged: Wheelchair manual, Hospital bed DME Agency: [employer] Date DME Agency Contacted: 02/12/22 Time DME Agency Contacted: [phone] Representative spoke with at [place]: Thedore Mins            Prior Living Arrangements/Services Living arrangements for the past 2 months: [place] with:: Self                   Activities of Daily Living Home Assistive Devices/Equipment: None ADL Screening (condition at time of admission) Patient's cognitive ability adequate to safely complete daily activities?: No Is the patient deaf or have difficulty hearing?: No Does the  patient have difficulty seeing, even when wearing glasses/contacts?: No Does the patient have difficulty concentrating, remembering, or making decisions?: No Patient able to express need for assistance with ADLs?: Yes Does the patient have difficulty dressing or bathing?: Yes Independently performs ADLs?: No Communication: Independent Dressing (OT): Needs assistance Is this a change from baseline?: Change from baseline, expected to last <3days Grooming: Dependent Is this a change from baseline?: Change from baseline, expected to last >3 days Feeding: Needs assistance Is this a change from baseline?: Change from baseline, expected to last >3 days Bathing: Needs assistance Is this a change from baseline?: Change from baseline, expected to last >3 days Toileting: Needs assistance Is this a change from baseline?: Change from baseline, expected to last >3days In/Out Bed: Needs assistance Is this a change from baseline?: Change from baseline, expected to last >3 days Walks in Home: Dependent Is this a change from baseline?: Change from baseline, expected to last >3 days Does the patient have difficulty walking or climbing stairs?: Yes Weakness of Legs: Both Weakness of Arms/Hands: Both  Permission Sought/Granted                  Emotional Assessment       Orientation: : Oriented to Self, Oriented to Place, Oriented to  Time, Oriented to Situation Alcohol / Substance Use: Not Applicable Psych  Involvement: No (comment)  Admission diagnosis:  Nausea [R11.0] AKI (acute kidney injury) ([place]) [N17.9] Sepsis ([place]) [A41.9] Diarrhea, unspecified type [R19.7] Sepsis, due to unspecified organism, unspecified whether acute organ dysfunction present [facility]) [A41.9] Patient Active Problem List   Diagnosis Date Noted   Sepsis ([place]) 02/11/2022   S/P IVC filter 09/11/2015   Cholelithiasis 09/11/2015   Anxiety 09/10/2015   Edema 09/10/2015   Knee pain 09/10/2015   Groin pain 09/10/2015    Vitamin D deficiency 09/10/2015   Obesity, morbid ([place]) 09/10/2015   Anemia, iron deficiency 09/25/2009   Pain in joint, multiple sites 09/22/2009   Essential (primary) hypertension 05/31/2009   Lipoma of lower extremity 05/31/2009   Palpitations 05/31/2009   GERD (gastroesophageal reflux disease) 05/31/2009   Obstructive sleep apnea 12/29/1998   PCP:  Birdie Sons, MD Pharmacy:   [facility] [place], [place] [address] [place] [address] [place] [address] Phone: [phone] Fax: [phone]     Social Determinants of Health (SDOH) Interventions    Readmission Risk Interventions No flowsheet data found.

## 2022-02-12 NOTE — Hospital Course (Addendum)
46 y.o. male with medical history significant for severe obesity with BMI of 99.85, obstructive sleep apnea, essential hypertension, GERD, cholelithiasis, bilateral lower extremity lipoma, status post IVC filter and chronic lymphedema, who presented to the ER with acute onset of diarrhea that occurs on Saturday as well as nausea without vomiting and significantly diminished appetite.  Admitted for suspected sepsis  2/15: Requesting hospital bed and bariatric walker.  Denies any further diarrhea.  Feeling very weak  2/16: Had 2-3 loose bowel movements 2/17: Low-grade fever, tachycardia.  ID added Zyvox 2/18: Getting electrolytes replaced, WBC worsening.,  Hypotensive and tachycardic.  He reports some loose bowel movement last night 2/19-persistent leukocytosis.  Electrolytes and CK much better

## 2022-02-12 NOTE — Progress Notes (Signed)
°  Progress Note   Patient: Christopher Herman LAG:[id number] DOB: 1976/03/20 DOA: 02/10/2022     1 DOS: the patient was seen and examined on 02/12/2022   Brief hospital course: 46 y.o. male with medical history significant for severe obesity with BMI of 99.85, obstructive sleep apnea, essential hypertension, GERD, cholelithiasis, bilateral lower extremity lipoma, status post IVC filter and chronic lymphedema, who presented to the ER with acute onset of diarrhea that occurs on Saturday as well as nausea without vomiting and significantly diminished appetite.  Admitted for suspected sepsis  2/15: Requesting hospital bed and bariatric walker.  Denies any further diarrhea.  Feeling very weak   Assessment and Plan: * Sepsis ([place])- (present on admission) No clear source.  Possible urine Continue IV Rocephin for now.  ID consult He denies any vomiting or diarrhea while in the hospital  AKI (acute kidney injury) ([place])- (present on admission) Likely prerenal with nausea vomiting diarrhea and poor p.o. intake.  Continue IV hydration and monitor renal function  Obesity, morbid ([place])- (present on admission) He is bedbound at baseline.  He is requesting bariatric wheelchair/walker and hospital bed.  He is planning to go back home at discharge  Vitamin D deficiency- (present on admission) Hypocalcemia -check PTH        Subjective: Feeling weak.  Denies any diarrhea or vomiting at this point.  Requesting hospital bed and bariatric walker  Physical Exam: Vitals:   02/12/22 0018 02/12/22 0453 02/12/22 0736 02/12/22 1138  BP: (!) 142/62 105/66 106/75 113/64  Pulse:  (!) 108 (!) 110 (!) 107  Resp: 20 20 18 20   Temp: 98.1 F (36.7 C) 98.7 F (37.1 C) 98.5 F (36.9 C) 99.3 F (37.4 C)  TempSrc: Oral Oral    SpO2: 98% 99% 99% 97%  Weight:      Height:       GENERAL:  46 y.o.-year-old morbidly obese African-American male patient lying in the bed with no acute distress.  EYES: Pupils equal,  round, reactive to light and accommodation. No scleral icterus. Extraocular muscles intact.  HEENT: Head atraumatic, normocephalic. Oropharynx and nasopharynx clear.  NECK:  Supple, no jugular venous distention. No thyroid enlargement, no tenderness.  LUNGS: Normal breath sounds bilaterally, no wheezing, rales,rhonchi or crepitation. No use of accessory muscles of respiration.  CARDIOVASCULAR: Regular rate and rhythm, S1, S2 normal. No murmurs, rubs, or gallops.  ABDOMEN: Soft, nondistended, nontender. Bowel sounds present. No organomegaly or mass.  EXTREMITIES: Chronic lymphedema with lichenification and elephantiasis with no cyanosis, or clubbing.  He has bilateral medial thigh large lipomas shown below. NEUROLOGIC: Cranial nerves II through XII are intact. Muscle strength 5/5 in all extremities. Sensation intact. Gait not checked.  PSYCHIATRIC: The patient is alert and oriented x 3.  Normal affect and good eye contact. SKIN: See below     Data Reviewed:  Severe leukocytosis, hypocalcemia, elevated creatinine  Family Communication: None at bedside  Disposition: Status is: Inpatient Remains inpatient appropriate because: Severe hypocalcemia, AKI, sepsis management      DVT prophylaxis-Lovenox subcu    Planned Discharge Destination: Home  Time spent: 35 minutes  Author: Max Sane, MD 02/12/2022 3:51 PM  For on call review [email or web address].

## 2022-02-12 NOTE — Consult Note (Signed)
NAME: GAY RAPE  DOB: 09/20/1976  MRN: [id number]  Date/Time: 02/12/2022 4:29 PM  REQUESTING PROVIDER: Dr.Shah Subjective:  REASON FOR CONSULT: sepsis ? Christopher Herman is a 46 y.o. male with extreme obesity , gastric bypass surgery presented to the ED on 2 /13/23 with 1 week h/o of feeling unwell, subjective fever and chills , excess thirst, and 1 day h/o no urinary output , diarrhea. Pt is house bound and immobile- can sit in bed and just stand next to bed- His mom helps with his ADL In the ED vitals temp 98.1, Bp 102/62, Pulse 114 WBC 10.9, HB 12.8, plt 333 and the same evening it was 42/10.8/728,  UA 11-20 WBC- Blood culture ordered but not sent until 02/11/22 and he was started on IV vanco/cefepime This was later switched to ceftriaxone I am asked to se ehim for possible sepsis Pt does not have any diarrhea since admission HE is feeling better in the past 24 hrs- denies any cough or sob, no pain legs but has lipedema and worsening heaviness     Past Medical History:  Diagnosis Date   Helicobacter pylori infection 09/10/2015   Palpitations 05/31/2009   Chronic, stable    Past Surgical History:  Procedure Laterality Date   GASTRIC BYPASS  05/23/2012    Social History   Socioeconomic History   Marital status: Single    Spouse name: Not on file   Number of children: 0   Years of education: Not on file   Highest education level: Not on file  Occupational History   Occupation: Employed    Comment: Works as a Systems developer  Tobacco Use   Smoking status: Former   Smokeless tobacco: Never   Tobacco comments:    Quit smoking in 2010, smoked cigarettes, smoked for about 16 years; smoked less than 1/2 pack per day.  Substance and Sexual Activity   Alcohol use: No   Drug use: No   Sexual activity: Not on file  Other Topics Concern   Not on file  Social History Narrative   Not on file   Social Determinants of Health   Financial Resource Strain: Not on file   Food Insecurity: Not on file  Transportation Needs: Not on file  Physical Activity: Not on file  Stress: Not on file  Social Connections: Not on file  Intimate Partner Violence: Not on file    History reviewed. No pertinent family history. No Known Allergies I? Current Facility-Administered Medications  Medication Dose Route Frequency Provider Last Rate Last Admin   acetaminophen (TYLENOL) tablet 650 mg  650 mg Oral Q6H PRN Mansy, Jan A, MD       Or   acetaminophen (TYLENOL) suppository 650 mg  650 mg Rectal Q6H PRN Mansy, Arvella Merles, MD       calcium-vitamin D (OSCAL WITH D) 500-5 MG-MCG per tablet 1 tablet  1 tablet Oral TID Mansy, Jan A, MD   1 tablet at 02/12/22 0845   cefTRIAXone (ROCEPHIN) 2 g in sodium chloride 0.9 % 100 mL IVPB  2 g Intravenous Q24H Mickeal Skinner A, RPH 200 mL/hr at 02/11/22 1717 2 g at 02/11/22 1717   enoxaparin (LOVENOX) injection 135 mg  0.5 mg/kg Subcutaneous Q24H Mansy, Jan A, MD   135 mg at 02/12/22 0845   magnesium hydroxide (MILK OF MAGNESIA) suspension 30 mL  30 mL Oral Daily PRN Mansy, Jan A, MD       ondansetron [facility]) tablet 4 mg  4 mg  Oral Q6H PRN Mansy, Jan A, MD       Or   ondansetron [facility]) injection 4 mg  4 mg Intravenous Q6H PRN Mansy, Jan A, MD       sodium bicarbonate 150 mEq in sterile water 1,150 mL infusion   Intravenous Continuous Mansy, Jan A, MD 100 mL/hr at 02/12/22 0555 New Bag at 02/12/22 0555   traZODone (DESYREL) tablet 25 mg  25 mg Oral QHS PRN Mansy, Jan A, MD         Abtx:  Anti-infectives (From admission, onward)    Start     Dose/Rate Route Frequency Ordered Stop   02/11/22 1800  cefTRIAXone (ROCEPHIN) 2 g in sodium chloride 0.9 % 100 mL IVPB        2 g 200 mL/hr over 30 Minutes Intravenous Every 24 hours 02/11/22 1155     02/11/22 1100  metroNIDAZOLE (FLAGYL) IVPB 500 mg  Status:  Discontinued        500 mg 100 mL/hr over 60 Minutes Intravenous Every 12 hours 02/11/22 0225 02/11/22 1155   02/11/22 0800  ceFEPIme  (MAXIPIME) 2 g in sodium chloride 0.9 % 100 mL IVPB  Status:  Discontinued        2 g 200 mL/hr over 30 Minutes Intravenous Every 8 hours 02/11/22 0225 02/11/22 1155   02/11/22 0245  vancomycin (VANCOREADY) IVPB 1000 mg/200 mL  Status:  Discontinued        1,000 mg 200 mL/hr over 60 Minutes Intravenous  Once 02/11/22 0242 02/11/22 0844   02/11/22 0234  vancomycin variable dose per unstable renal function (pharmacist dosing)  Status:  Discontinued         Does not apply See admin instructions 02/11/22 0234 02/11/22 1155   02/11/22 0230  vancomycin (VANCOCIN) IVPB 1000 mg/200 mL premix  Status:  Discontinued        1,000 mg 200 mL/hr over 60 Minutes Intravenous  Once 02/11/22 0225 02/11/22 0229   02/10/22 2315  vancomycin (VANCOREADY) IVPB 1000 mg/200 mL  Status:  Discontinued        1,000 mg 200 mL/hr over 60 Minutes Intravenous  Once 02/10/22 2313 02/10/22 2314   02/10/22 2315  vancomycin (VANCOCIN) IVPB 1000 mg/200 mL premix  Status:  Discontinued        1,000 mg 200 mL/hr over 60 Minutes Intravenous  Once 02/10/22 2314 02/11/22 0422   02/10/22 2300  ceFEPIme (MAXIPIME) 2 g in sodium chloride 0.9 % 100 mL IVPB        2 g 200 mL/hr over 30 Minutes Intravenous  Once 02/10/22 2249 02/11/22 0045   02/10/22 2300  metroNIDAZOLE (FLAGYL) IVPB 500 mg  Status:  Discontinued        500 mg 100 mL/hr over 60 Minutes Intravenous  Once 02/10/22 2249 02/10/22 2250   02/10/22 2300  vancomycin (VANCOCIN) IVPB 1000 mg/200 mL premix  Status:  Discontinued        1,000 mg 200 mL/hr over 60 Minutes Intravenous  Once 02/10/22 2249 02/10/22 2250   02/10/22 2245  vancomycin (VANCOREADY) IVPB 1500 mg/300 mL        1,500 mg 150 mL/hr over 120 Minutes Intravenous  Once 02/10/22 2241 02/11/22 0325   02/10/22 2215  levofloxacin (LEVAQUIN) IVPB 500 mg        500 mg 100 mL/hr over 60 Minutes Intravenous  Once 02/10/22 2208 02/10/22 2358   02/10/22 2215  metroNIDAZOLE (FLAGYL) IVPB 500 mg  500 mg 100 mL/hr  over 60 Minutes Intravenous  Once 02/10/22 2208 02/10/22 2358       REVIEW OF SYSTEMS:  Const: subjective fever, negative chills, negative weight loss, was taking ibuprofen Eyes: negative diplopia or visual changes, negative eye pain ENT: negative coryza, negative sore throat Resp: negative cough, hemoptysis, dyspnea Cards: negative for chest pain, palpitations, lower extremity edema GU: negative for frequency, dysuria and hematuria GI: Negative for abdominal pain, had  diarrhea, bleeding, constipation Skin: negative for rash and pruritus Heme: negative for easy bruising and gum/nose bleeding MS: general weakness Neurolo:negative for headaches, dizziness, vertigo, memory problems  Psych: negative for feelings of anxiety, depression  Endocrine: negative for thyroid, diabetes Allergy/Immunology- negative for any medication or food allergies ?  Objective:  VITALS:  BP 108/74 (BP Location: Right Arm)    Pulse (!) 109    Temp 98.5 F (36.9 C)    Resp 16    Ht 5\' 5"  (1.651 m)    Wt (!) 272.2 kg    SpO2 99%    BMI 99.85 kg/m  PHYSICAL EXAM: examination is limited as unable to turn patient to the side General: Alert, cooperative, no distress, appears stated age.  Head: Normocephalic, without obvious abnormality, atraumatic. Eyes: Conjunctivae clear, anicteric sclerae. Pupils are equal ENT Nares normal. No drainage or sinus tenderness. Lips, mucosa, and tongue normal. No Thrush Neck: symmetrical, no adenopathy, thyroid: non tender no carotid bruit and no JVD. Back: No CVA tenderness. Lungs: Clear to auscultation bilaterally. No Wheezing or Rhonchi. No rales. Heart: Regular rate and rhythm, no murmur, rub or gallop. Abdomen: Soft, non-tender,not distended. Bowel sounds normal. No masses Extremities: severe lipedema of thighs  Skin: No rash Lymph: Cervical, supraclavicular normal. Neurologic: Grossly non-focal Pertinent Labs Lab Results CBC    Component Value Date/Time   WBC  31.0 (H) 02/11/2022 0635   RBC 4.03 (L) 02/11/2022 0635   HGB 10.7 (L) 02/11/2022 0635   HGB 12.8 (L) 02/11/2012 1227   HCT 31.4 (L) 02/11/2022 0635   HCT 39.3 (L) 02/11/2012 1227   PLT 530 (H) 02/11/2022 0635   PLT 333 02/11/2012 1227   MCV 77.9 (L) 02/11/2022 0635   MCV 83 02/11/2012 1227   MCH 26.6 02/11/2022 0635   MCHC 34.1 02/11/2022 0635   RDW 15.4 02/11/2022 0635   RDW 16.0 (H) 02/11/2012 1227   LYMPHSABS 1.9 02/11/2022 0635   LYMPHSABS 2.2 02/11/2012 1227   MONOABS 1.4 (H) 02/11/2022 0635   MONOABS 0.5 02/11/2012 1227   EOSABS 0.0 02/11/2022 0635   EOSABS 0.1 02/11/2012 1227   BASOSABS 0.1 02/11/2022 0635   BASOSABS 0.0 02/11/2012 1227    CMP Latest Ref Rng & Units 02/11/2022 02/10/2022 02/11/2012  Glucose 70 - 99 mg/dL 114(H) 96 82  BUN 6 - 20 mg/dL 30(H) 29(H) 11  Creatinine 0.61 - 1.24 mg/dL 2.54(H) 3.03(H) 0.89  Sodium 135 - 145 mmol/L 129(L) 126(L) 140  Potassium 3.5 - 5.1 mmol/L 3.7 4.1 3.8  Chloride 98 - 111 mmol/L 95(L) 89(L) 105  CO2 22 - 32 mmol/L 15(L) 14(L) 26  Calcium 8.9 - 10.3 mg/dL 5.3(LL) 5.6(LL) 8.7  Total Protein 6.5 - 8.1 g/dL 6.8 9.1(H) 8.7(H)  Total Bilirubin 0.3 - 1.2 mg/dL 1.8(H) 2.1(H) 0.5  Alkaline Phos 38 - 126 U/L 77 105 67  AST 15 - 41 U/L 95(H) 92(H) 17  ALT 0 - 44 U/L 23 26 26       Microbiology: Recent Results (from the past 240 hour(s))  Resp Panel by RT-PCR (Flu A&B, Covid) Nasopharyngeal Swab     Status: None   Collection Time: 02/11/22 12:57 AM   Specimen: Nasopharyngeal Swab; Nasopharyngeal(NP) swabs in vial transport medium  Result Value Ref Range Status   SARS Coronavirus 2 by RT PCR NEGATIVE NEGATIVE Final    Comment: (NOTE) SARS-CoV-2 target nucleic acids are NOT DETECTED.  The SARS-CoV-2 RNA is generally detectable in upper respiratory specimens during the acute phase of infection. The lowest concentration of SARS-CoV-2 viral copies this assay can detect is 138 copies/mL. A negative result does not preclude  SARS-Cov-2 infection and should not be used as the sole basis for treatment or other patient management decisions. A negative result may occur with  improper specimen collection/handling, submission of specimen other than nasopharyngeal swab, presence of viral mutation(s) within the areas targeted by this assay, and inadequate number of viral copies(<138 copies/mL). A negative result must be combined with clinical observations, patient history, and epidemiological information. The expected result is Negative.  Fact Sheet for Patients:  EntrepreneurPulse.com.au  Fact Sheet for Healthcare Providers:  IncredibleEmployment.be  This test is no t yet approved or cleared by the [place] FDA and  has been authorized for detection and/or diagnosis of SARS-CoV-2 by FDA under an Emergency Use Authorization (EUA). This EUA will remain  in effect (meaning this test can be used) for the duration of the COVID-19 declaration under Section 564(b)(1) of the Act, 21 U.S.C.section 360bbb-3(b)(1), unless the authorization is terminated  or revoked sooner.       Influenza A by PCR NEGATIVE NEGATIVE Final   Influenza B by PCR NEGATIVE NEGATIVE Final    Comment: (NOTE) The Xpert Xpress SARS-CoV-2/FLU/RSV plus assay is intended as an aid in the diagnosis of influenza from Nasopharyngeal swab specimens and should not be used as a sole basis for treatment. Nasal washings and aspirates are unacceptable for Xpert Xpress SARS-CoV-2/FLU/RSV testing.  Fact Sheet for Patients: EntrepreneurPulse.com.au  Fact Sheet for Healthcare Providers: IncredibleEmployment.be  This test is not yet approved or cleared by the [place] FDA and has been authorized for detection and/or diagnosis of SARS-CoV-2 by FDA under an Emergency Use Authorization (EUA). This EUA will remain in effect (meaning this test can be used) for the duration of  the COVID-19 declaration under Section 564(b)(1) of the Act, 21 U.S.C. section 360bbb-3(b)(1), unless the authorization is terminated or revoked.  Performed at [facility], [address] [place], [place] [address]   MRSA Next Gen by PCR, Nasal     Status: None   Collection Time: 02/11/22  9:30 AM   Specimen: Nasal Mucosa; Nasal Swab  Result Value Ref Range Status   MRSA by PCR Next Gen NOT DETECTED NOT DETECTED Final    Comment: (NOTE) The GeneXpert MRSA Assay (FDA approved for NASAL specimens only), is one component of a comprehensive MRSA colonization surveillance program. It is not intended to diagnose MRSA infection nor to guide or monitor treatment for MRSA infections. Test performance is not FDA approved in patients less than 42 years old. Performed at [facility], [address] [place], [place] [address]   Culture, blood (Routine X 2) w Reflex to ID Panel     Status: None (Preliminary result)   Collection Time: 02/11/22 10:33 AM   Specimen: BLOOD  Result Value Ref Range Status   Specimen Description BLOOD BLOOD LEFT HAND  Final   Special Requests   Final    BOTTLES DRAWN AEROBIC AND ANAEROBIC Blood  Culture adequate volume   Culture   Final    NO GROWTH < 24 HOURS Performed at [facility], [address] [place], [place] [address]    Report Status PENDING  Incomplete  Culture, blood (Routine X 2) w Reflex to ID Panel     Status: None (Preliminary result)   Collection Time: 02/11/22  3:25 PM   Specimen: BLOOD  Result Value Ref Range Status   Specimen Description BLOOD BLOOD LEFT HAND  Final   Special Requests BOTTLES DRAWN AEROBIC AND ANAEROBIC BCAV  Final   Culture   Final    NO GROWTH < 24 HOURS Performed at [facility], [address] [place], [place] [address]    Report Status PENDING  Incomplete    IMAGING RESULTS: US abdomen Cholelithiasis without sonographic evidence of acute cholecystitis. 2.  Hepatomegaly.  I have personally reviewed the films ? Impression/Recommendation ? ?46 yr male presenting with subjective fever, 2 episodes of diarrhea Found to have AKI, with initial labs looking good but worse in 12 hrs with severe leucocytosis, anemia and thrombocytosis . Was the first lab an error and was there a true worsening of labs in 12 hrs Unclear etiolgoy Infective gastroenteritis VS systemic infection causing diarrhea Is the lipedema ( lymphedema) infected? Not obvious HE has improved since admission- no diarrhea since ad mission - hence no stool test could be sent Okay to just continue only ceftriaxone  Hyponatremia  Anemia Leucocytosis Hypoalbuminemia  ?H/o gastric bypass ___________________________________________________ Discussed with patient, and his mother Note:  This document was prepared using Dragon voice recognition software and may include unintentional dictation errors.

## 2022-02-12 NOTE — Assessment & Plan Note (Signed)
He is bedbound at baseline.  He is requesting bariatric wheelchair/walker and hospital bed.  He is planning to go back home at discharge

## 2022-02-13 ENCOUNTER — Inpatient Hospital Stay: Payer: BC Managed Care – PPO

## 2022-02-13 DIAGNOSIS — N179 Acute kidney failure, unspecified: Secondary | ICD-10-CM | POA: Diagnosis not present

## 2022-02-13 DIAGNOSIS — A419 Sepsis, unspecified organism: Secondary | ICD-10-CM | POA: Diagnosis not present

## 2022-02-13 DIAGNOSIS — E878 Other disorders of electrolyte and fluid balance, not elsewhere classified: Secondary | ICD-10-CM | POA: Diagnosis present

## 2022-02-13 LAB — GASTROINTESTINAL PANEL BY PCR, STOOL (REPLACES STOOL CULTURE)

## 2022-02-13 LAB — CBC WITH DIFFERENTIAL/PLATELET
Abs Immature Granulocytes: 1.06 10*3/uL — ABNORMAL HIGH (ref 0.00–0.07)
Basophils Absolute: 0.1 10*3/uL (ref 0.0–0.1)
Basophils Relative: 0 %
Eosinophils Absolute: 0.2 10*3/uL (ref 0.0–0.5)
Eosinophils Relative: 1 %
HCT: 29.2 % — ABNORMAL LOW (ref 39.0–52.0)
Hemoglobin: 9.8 g/dL — ABNORMAL LOW (ref 13.0–17.0)
Immature Granulocytes: 5 %
Lymphocytes Relative: 9 %
Lymphs Abs: 2.1 10*3/uL (ref 0.7–4.0)
MCH: 26.7 pg (ref 26.0–34.0)
MCHC: 33.6 g/dL (ref 30.0–36.0)
MCV: 79.6 fL — ABNORMAL LOW (ref 80.0–100.0)
Monocytes Absolute: 1 10*3/uL (ref 0.1–1.0)
Monocytes Relative: 4 %
Neutro Abs: 18.4 10*3/uL — ABNORMAL HIGH (ref 1.7–7.7)
Neutrophils Relative %: 81 %
Platelets: 497 10*3/uL — ABNORMAL HIGH (ref 150–400)
RBC: 3.67 MIL/uL — ABNORMAL LOW (ref 4.22–5.81)
RDW: 15.1 % (ref 11.5–15.5)
WBC: 22.8 10*3/uL — ABNORMAL HIGH (ref 4.0–10.5)
nRBC: 0 % (ref 0.0–0.2)

## 2022-02-13 LAB — BASIC METABOLIC PANEL
Anion gap: 9 (ref 5–15)
BUN: 11 mg/dL (ref 6–20)
CO2: 28 mmol/L (ref 22–32)
Calcium: 5 mg/dL — CL (ref 8.9–10.3)
Chloride: 94 mmol/L — ABNORMAL LOW (ref 98–111)
Creatinine, Ser: 0.61 mg/dL (ref 0.61–1.24)
GFR, Estimated: >60 mL/min (ref >60)
Glucose, Bld: 111 mg/dL — ABNORMAL HIGH (ref 70–99)
Potassium: 3.2 mmol/L — ABNORMAL LOW (ref 3.5–5.1)
Sodium: 131 mmol/L — ABNORMAL LOW (ref 135–145)

## 2022-02-13 LAB — HEPATIC FUNCTION PANEL
ALT: 15 U/L (ref 0–44)
AST: 48 U/L — ABNORMAL HIGH (ref 15–41)
Albumin: 1.7 g/dL — ABNORMAL LOW (ref 3.5–5.0)
Alkaline Phosphatase: 56 U/L (ref 38–126)
Bilirubin, Direct: 0.1 mg/dL (ref 0.0–0.2)
Indirect Bilirubin: 0.5 mg/dL (ref 0.3–0.9)
Total Bilirubin: 0.6 mg/dL (ref 0.3–1.2)
Total Protein: 6 g/dL — ABNORMAL LOW (ref 6.5–8.1)

## 2022-02-13 LAB — PHOSPHORUS: Phosphorus: 2.2 mg/dL — ABNORMAL LOW (ref 2.5–4.6)

## 2022-02-13 LAB — VITAMIN B12: Vitamin B-12: 326 pg/mL (ref 180–914)

## 2022-02-13 LAB — CBC
HCT: 29.7 % — ABNORMAL LOW (ref 39.0–52.0)
Hemoglobin: 9.7 g/dL — ABNORMAL LOW (ref 13.0–17.0)
MCH: 26.4 pg (ref 26.0–34.0)
MCHC: 32.7 g/dL (ref 30.0–36.0)
MCV: 80.9 fL (ref 80.0–100.0)
Platelets: 469 10*3/uL — ABNORMAL HIGH (ref 150–400)
RBC: 3.67 MIL/uL — ABNORMAL LOW (ref 4.22–5.81)
RDW: 15.4 % (ref 11.5–15.5)
WBC: 23.8 10*3/uL — ABNORMAL HIGH (ref 4.0–10.5)
nRBC: 0 % (ref 0.0–0.2)

## 2022-02-13 LAB — TECHNOLOGIST SMEAR REVIEW: Plt Morphology: NONE SEEN

## 2022-02-13 LAB — RETICULOCYTES
Immature Retic Fract: 13.4 % (ref 2.3–15.9)
RBC.: 3.62 MIL/uL — ABNORMAL LOW (ref 4.22–5.81)
Retic Count, Absolute: 23.2 10*3/uL (ref 19.0–186.0)
Retic Ct Pct: 0.6 % (ref 0.4–3.1)

## 2022-02-13 LAB — IRON AND TIBC
Iron: 50 ug/dL (ref 45–182)
Saturation Ratios: 42 % — ABNORMAL HIGH (ref 17.9–39.5)
TIBC: 120 ug/dL — ABNORMAL LOW (ref 250–450)
UIBC: 70 ug/dL

## 2022-02-13 LAB — C DIFFICILE QUICK SCREEN W PCR REFLEX
C Diff antigen: NEGATIVE
C Diff interpretation: NOT DETECTED
C Diff toxin: NEGATIVE

## 2022-02-13 LAB — FERRITIN: Ferritin: 562 ng/mL — ABNORMAL HIGH (ref 24–336)

## 2022-02-13 LAB — URINE CULTURE: Culture: NO GROWTH

## 2022-02-13 LAB — MAGNESIUM: Magnesium: 1.8 mg/dL (ref 1.7–2.4)

## 2022-02-13 LAB — IRON: Iron: 51 ug/dL (ref 45–182)

## 2022-02-13 LAB — VITAMIN D 25 HYDROXY (VIT D DEFICIENCY, FRACTURES): Vit D, 25-Hydroxy: 7.15 ng/mL — ABNORMAL LOW (ref 30–100)

## 2022-02-13 LAB — CK: Total CK: 5033 U/L — ABNORMAL HIGH (ref 49–397)

## 2022-02-13 LAB — LACTATE DEHYDROGENASE: LDH: 415 U/L — ABNORMAL HIGH (ref 98–192)

## 2022-02-13 LAB — ALBUMIN: Albumin: 1.6 g/dL — ABNORMAL LOW (ref 3.5–5.0)

## 2022-02-13 LAB — FOLATE: Folate: 4.5 ng/mL — ABNORMAL LOW (ref >5.9)

## 2022-02-13 MED ORDER — SODIUM PHOSPHATES 45 MMOLE/15ML IV SOLN
15.0000 mmol | Freq: Once | INTRAVENOUS | Status: AC
  Administered 2022-02-13: 15 mmol via INTRAVENOUS
  Filled 2022-02-13: qty 5

## 2022-02-13 MED ORDER — IOHEXOL 9 MG/ML PO SOLN: 1000.0000 mL | Freq: Once | ORAL | Status: DC | PRN

## 2022-02-13 MED ORDER — ENSURE MAX PROTEIN PO LIQD
11.0000 [oz_av] | Freq: Every day | ORAL | Status: DC
  Administered 2022-02-14 – 2022-02-15 (×2): 11 [oz_av] via ORAL
  Filled 2022-02-13: qty 330

## 2022-02-13 MED ORDER — LOPERAMIDE HCL 2 MG PO CAPS
2.0000 mg | ORAL_CAPSULE | ORAL | Status: DC | PRN
  Administered 2022-02-13 – 2022-02-16 (×5): 2 mg via ORAL
  Filled 2022-02-13 (×5): qty 1

## 2022-02-13 MED ORDER — SODIUM CHLORIDE 0.9 % IV SOLN: INTRAVENOUS | Status: DC

## 2022-02-13 MED ORDER — ADULT MULTIVITAMIN W/MINERALS CH
1.0000 | ORAL_TABLET | Freq: Two times a day (BID) | ORAL | Status: DC
  Administered 2022-02-13 – 2022-02-17 (×8): 1 via ORAL
  Filled 2022-02-13 (×8): qty 1

## 2022-02-13 MED ORDER — MAGNESIUM SULFATE 2 GM/50ML IV SOLN
2.0000 g | Freq: Once | INTRAVENOUS | Status: AC
  Administered 2022-02-13: 2 g via INTRAVENOUS
  Filled 2022-02-13: qty 50

## 2022-02-13 MED ORDER — POTASSIUM CHLORIDE CRYS ER 20 MEQ PO TBCR
40.0000 meq | EXTENDED_RELEASE_TABLET | Freq: Once | ORAL | Status: AC
Start: 2022-02-13 — End: 2022-02-13
  Administered 2022-02-13: 40 meq via ORAL
  Filled 2022-02-13: qty 2

## 2022-02-13 MED ORDER — IOHEXOL 350 MG/ML SOLN: 125.0000 mL | Freq: Once | INTRAVENOUS | Status: DC | PRN

## 2022-02-13 MED ORDER — ALBUMIN HUMAN 25 % IV SOLN
12.5000 g | Freq: Once | INTRAVENOUS | Status: AC
  Administered 2022-02-13: 12.5 g via INTRAVENOUS
  Filled 2022-02-13: qty 50

## 2022-02-13 MED ORDER — CALCIUM CARBONATE ANTACID 500 MG PO CHEW
1.0000 | CHEWABLE_TABLET | Freq: Three times a day (TID) | ORAL | Status: DC
  Administered 2022-02-14 – 2022-02-17 (×11): 200 mg via ORAL
  Filled 2022-02-13 (×11): qty 1

## 2022-02-13 MED ORDER — CALCIUM GLUCONATE-NACL 2-0.675 GM/100ML-% IV SOLN
2.0000 g | Freq: Once | INTRAVENOUS | Status: AC
  Administered 2022-02-13: 2000 mg via INTRAVENOUS
  Filled 2022-02-13: qty 100

## 2022-02-13 NOTE — Assessment & Plan Note (Addendum)
Hypocalcemia -check PTH - Corrected calcium 6.92, We will give calcium gluconate

## 2022-02-13 NOTE — Progress Notes (Signed)
Cross Cover Calcium 5 - prior albumin low. Albumin level added to blood in lab, will need supplemented if corrected calcium low

## 2022-02-13 NOTE — Progress Notes (Signed)
Initial Nutrition Assessment  DOCUMENTATION CODES:   Morbid obesity  INTERVENTION:   -Ensure Max po daily, each supplement provides 150 kcal and 30 grams of protein -500 mg calcium carbonate TID -MVI with minerals BID -Check bariatric labs for assess for potential deficiencies: iron, thiamine, copper zinc, vitamin B-12, vitamin D, vitamin A, vitamin E, calcium, and vitamin K  NUTRITION DIAGNOSIS:   Increased nutrient needs related to acute illness as evidenced by estimated needs.  GOAL:   Patient will meet greater than or equal to 90% of their needs  MONITOR:   PO intake, Supplement acceptance, Labs, Weight trends, Skin, I & O's  REASON FOR ASSESSMENT:   Low Braden    ASSESSMENT:   46 y.o. male with medical history significant for severe obesity with BMI of 99.85, obstructive sleep apnea, essential hypertension, GERD, cholelithiasis, bilateral lower extremity lipoma, status post IVC filter and chronic lymphedema, who presented to the ER with acute onset of diarrhea that occurs on Saturday as well as nausea without vomiting and significantly diminished appetite.  Admitted for suspected sepsis  Pt admitted with sepsis.   Reviewed I/O's: -1.6 L x 24 hours and -1.1 L since admission  UOP: 1.8 L x 24 hours  Pt sleeping soundly at time of visit. He did not arouse to voice or touch. No family present to obtain additional history.   Pt with good appetite. Noted meal completions 95-100%.   Reviewed wt hx; no history of weight loss noted. Pt with moderate edema, which may be masking true weight loss as well as fat and muscle depletions.   Per chart review, pt with history of bariatric surgery. Case discussed with MD; amenable to order standard bariatric vitamin supplementations and check bariatric labs for potential deficiencies.   Medications reviewed and include 0.9% sodium chloride infusion @ 125 ml/hr, sodium bicarbonate 150 mEq infusion @ 100 ml/hr, and sodium phosphate in  5% dextrose infusion.    Labs reviewed: Na: 131, K: 3.2, Phos: 2.2.    NUTRITION - FOCUSED PHYSICAL EXAM:  Flowsheet Row Most Recent Value  Orbital Region No depletion  Upper Arm Region No depletion  Thoracic and Lumbar Region No depletion  Buccal Region No depletion  Temple Region No depletion  Clavicle Bone Region No depletion  Clavicle and Acromion Bone Region No depletion  Scapular Bone Region No depletion  Dorsal Hand No depletion  Patellar Region No depletion  Anterior Thigh Region No depletion  Posterior Calf Region No depletion  Edema (RD Assessment) Moderate  Hair Reviewed  Eyes Reviewed  Mouth Reviewed  Skin Reviewed  Nails Reviewed       Diet Order:   Diet Order             Diet Heart Room service appropriate? Yes; Fluid consistency: Thin  Diet effective now                   EDUCATION NEEDS:   No education needs have been identified at this time  Skin:  Skin Assessment: Skin Integrity Issues: Skin Integrity Issues:: Other (Comment) Other: non-pressure wounds to lt thigh, rt thigh, and posterior buttocks  Last BM:  02/13/22  Height:   Ht Readings from Last 1 Encounters:  02/10/22 5\' 5"  (1.651 m)    Weight:   Wt Readings from Last 1 Encounters:  02/10/22 (!) 272.2 kg    Ideal Body Weight:  61.8 kg  BMI:  Body mass index is 99.85 kg/m.  Estimated Nutritional Needs:   Kcal:  4859-2763  Protein:  120-135 grams  Fluid:  > 1.8 L    Loistine Chance, RD, LDN, [place] Registered Dietitian II Certified Diabetes Care and Education Specialist Please refer to [facility] for RD and/or RD on-call/weekend/after hours pager

## 2022-02-13 NOTE — Progress Notes (Signed)
Spoke with pt who is concerned he will be discharged prior to his hospital bed being delivered to home. Reassured the pt I would relay his concerns as he does not feel he is mobile enough at this time without his hospital bed.

## 2022-02-13 NOTE — TOC Progression Note (Addendum)
Transition of Care [facility]) - Progression Note    Patient Details  Name: Christopher Herman MRN: [id number] Date of Birth: Apr 19, 1976  Transition of [facility]) CM/SW [place], LCSW Phone Number: 02/13/2022, 12:39 PM  Clinical Narrative:  Patient found a DME agency that would be able to supply a bariatric wheelchair: [employer] [facility] [employer] in [place]. CSW called them and obtained faxed number. Faxed facesheet and DME order.   1:41 pm: Spoke with Moshe Salisbury at [employer] They do have a bariatric wheelchair that holds up to 700 lbs but she's unsure if they can get it approved. She is going to fax a document to [place] with information on what needs to be sent over. Per [employer] representative, patient requested that his hospital bed be delivered on Saturday.  4:02 pm: Paperwork from [employer] on chart for MD to fill out and sign.  4:45 pm: Received voicemail from patient stating that his bed and wheelchair will not be ready until Sunday and asking what we can do since he won't have a bed to go home today. Tried calling him back on room phone x 3 but line is busy. Left him a voicemail on cell phone. Waiting on [employer] representative to call back to confirm patient did in fact request that bed and walker be delivered on Saturday.  Expected Discharge Plan: Home/Self Care Barriers to Discharge: Continued Medical Work up  Expected Discharge Plan and Services Expected Discharge Plan: Home/Self Care       Living arrangements for the past 2 months: Single Family Home                 DME Arranged: Wheelchair manual, Hospital bed DME Agency: [employer] Date DME Agency Contacted: 02/12/22 Time DME Agency Contacted: [id number] Representative spoke with at DME Agency: Thedore Mins             Social Determinants of Health ([place]) Interventions    Readmission Risk Interventions No flowsheet data found.

## 2022-02-13 NOTE — Progress Notes (Signed)
See previous notes. Patient reports not using cpap

## 2022-02-13 NOTE — Assessment & Plan Note (Signed)
Likely prerenal with nausea vomiting diarrhea and poor p.o. intake.  Resolved with IV hydration

## 2022-02-13 NOTE — Progress Notes (Signed)
°  Progress Note   Patient: Christopher Herman NID:[id number] DOB: [birth year] DOA: 02/10/2022     2 DOS: the patient was seen and examined on 02/13/2022   Brief hospital course: 46 y.o. male with medical history significant for severe obesity with BMI of 99.85, obstructive sleep apnea, essential hypertension, GERD, cholelithiasis, bilateral lower extremity lipoma, status post IVC filter and chronic lymphedema, who presented to the ER with acute onset of diarrhea that occurs on Saturday as well as nausea without vomiting and significantly diminished appetite.  Admitted for suspected sepsis  2/15: Requesting hospital bed and bariatric walker.  Denies any further diarrhea.  Feeling very weak  2/16: Had 2-3 loose bowel movements   Assessment and Plan: * Sepsis ([place])- (present on admission) No clear source.  Possible urine Continue IV Rocephin for now.  ID input appreciated He reports 2-3 loose watery bowel movement last night.  Stool softeners stopped  Electrolyte abnormality- (present on admission) Corrected calcium 6.92 - calcium gluconate 2 grams IV x 1.  Also replacing potassium, magnesium and phosphorus Likely volume depleted with significantly low albumin--IV albumin x 1.  Pharmacy managing this  AKI (acute kidney injury) ([place])- (present on admission) Likely prerenal with nausea vomiting diarrhea and poor p.o. intake.  Resolved with IV hydration  Obesity, morbid ([place])- (present on admission) He is bedbound at baseline.  He is requesting bariatric wheelchair/walker and hospital bed.  He is planning to go back home at discharge  Vitamin D deficiency- (present on admission) Hypocalcemia -check PTH - Corrected calcium 6.92, We will give calcium gluconate        Subjective: Reports 2-3 watery bowel movement last night.   Physical Exam: Vitals:   02/12/22 2333 02/13/22 0350 02/13/22 0751 02/13/22 1203  BP: 122/70 (!) 96/52 121/64 120/62  Pulse: (!) 113 (!) 108 (!) 108 (!) 112   Resp: 20 20    Temp: 99.2 F (37.3 C) 99.8 F (37.7 C) 98.2 F (36.8 C) 98.2 F (36.8 C)  TempSrc:      SpO2: 95% 93% 95% 98%  Weight:      Height:       GENERAL:46 y.o.-year-oldmorbidly obese African-American malepatient lying in the bed with no acute distress.  EYES: Pupils equal, round, reactive to light and accommodation. No scleral icterus. Extraocular muscles intact.  HEENT: Head atraumatic, normocephalic. Oropharynx and nasopharynx clear.  NECK: Supple, no jugular venous distention. No thyroid enlargement, no tenderness.  LUNGS: Normal breath sounds bilaterally, no wheezing, rales,rhonchi or crepitation. No use of accessory muscles of respiration.  CARDIOVASCULAR: Regular rate and rhythm, S1, S2 normal. No murmurs, rubs, or gallops.  ABDOMEN: Soft, nondistended, nontender. Bowel sounds present. No organomegaly or mass.  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 He has bilateral medial thigh large lipomas shown below. NEUROLOGIC: Cranial nerves II through XII are intact. Muscle strength 5/5 in all extremities. Sensation intact. Gait not checked.  PSYCHIATRIC: The patient is alert and oriented x 3. Normal affect and good eye contact. Skin: as below     Data Reviewed:  Electrolyte abnormalities including hypophosphatemia/hypomagnesemia, hypocalcemia, hyponatremia, hypokalemia.  Elevated CK, LDH, cortisol and WBC  Family Communication: none at bedside  Disposition: Status is: Inpatient Remains inpatient appropriate because: electrolyte replacement   DVT prophylaxis-subcu Lovenox        Planned Discharge Destination: Home     Time spent: 35 minutes  Author: Max Sane, MD 02/13/2022 3:00 PM  For on call review [email or web address].

## 2022-02-13 NOTE — Assessment & Plan Note (Addendum)
Corrected calcium 6.92 - calcium gluconate 2 grams IV x 1.  Also replacing potassium, magnesium and phosphorus Likely volume depleted with significantly low albumin--IV albumin x 1.  Pharmacy managing this

## 2022-02-13 NOTE — Assessment & Plan Note (Signed)
He is bedbound at baseline.  He is requesting bariatric wheelchair/walker and hospital bed.  He is planning to go back home at discharge

## 2022-02-13 NOTE — Progress Notes (Signed)
Pt have start to drink his Omnipaque oral solution at 1640 PM.

## 2022-02-13 NOTE — Progress Notes (Signed)
Mobility Specialist - Progress Note   02/13/22 1711  Mobility  Activity Turned to right side;Turned to left side;Turned to back - supine  $Mobility charge 1 Mobility    +7 assist to roll L/R.  Kathee Delton Mobility Specialist 02/13/22, 5:12 PM

## 2022-02-13 NOTE — Assessment & Plan Note (Signed)
No clear source.  Possible urine Continue IV Rocephin for now.  ID input appreciated He reports 2-3 loose watery bowel movement last night.  Stool softeners stopped

## 2022-02-13 NOTE — Progress Notes (Addendum)
° °  Date of Admission:  02/10/2022      ID: Christopher Herman is a 46 y.o. male Principal Problem:   Sepsis ([place]) Active Problems:   Vitamin D deficiency   Obesity, morbid ([place])   AKI (acute kidney injury) ([place])    Subjective: Says he is feeling thirsty Has some dizziness after eating he says  Medications:   enoxaparin (LOVENOX) injection  0.5 mg/kg Subcutaneous Q24H    Objective: Vital signs in last 24 hours: Temp:  [98.2 F (36.8 C)-99.8 F (37.7 C)] 98.2 F (36.8 C) (02/16 1203) Pulse Rate:  [108-113] 112 (02/16 1203) Resp:  [16-20] 20 (02/16 0350) BP: (96-122)/(52-74) 120/62 (02/16 1203) SpO2:  [93 %-99 %] 98 % (02/16 1203)  PHYSICAL EXAM:  General: Alert, cooperative, no distress, appears stated age. Morbidly obese Head: Normocephalic, without obvious abnormality, atraumatic. Eyes: Conjunctivae clear, anicteric sclerae. Pupils are equal ENT Nares normal. No drainage or sinus tenderness. Lips, mucosa, and tongue normal. No Thrush Neck: Supple, symmetrical, no adenopathy, thyroid: non tender no carotid bruit and no JVD.  Lungs: Clear to auscultation bilaterally. No Wheezing or Rhonchi. No rales. Heart: Regular rate and rhythm, no murmur, rub or gallop. Abdomen: Soft, non-tender,not distended. Bowel sounds normal. No masses Extremities: severe lipedema/lymphedema of the thigh b/l Back: patient was turned to the side with help of multiple staff- skin tears, wetness- not able to examine properly though Lymph: Cervical, supraclavicular normal. Neurologic: Grossly non-focal  Lab Results Recent Labs    02/11/22 0519 02/11/22 0635 02/13/22 0013 02/13/22 0509  WBC  --    < > 22.8* 23.8*  HGB  --    < > 9.8* 9.7*  HCT  --    < > 29.2* 29.7*  NA 129*  --   --  131*  K 3.7  --   --  3.2*  CL 95*  --   --  94*  CO2 15*  --   --  28  BUN 30*  --   --  11  CREATININE 2.54*  --   --  0.61   < > = values in this interval not displayed.   Liver Panel Recent Labs     02/10/22 1936 02/11/22 0519 02/13/22 0509  PROT 9.1* 6.8  --   ALBUMIN 2.6* 2.0* 1.6*  AST 92* 95*  --   ALT 26 23  --   ALKPHOS 105 77  --   BILITOT 2.1* 1.8*  --    Microbiology: BC-NG UC-NG     Assessment/Plan: 7yr male presented with subjective fever, weakness, dizziness, diarrhea and sepsis like picture Severe leucocytosis, AKI, hyponatremia, anemia, hypoalbuminemia  Unclear source for sepsis Skin? UTI?  Pt is on ceftriaxone and some improvement Diarrhea resolved- Cdiff/GI panel negative  Recommend CT abdomen and pelvis to look for any intra-abdominal source for high wbc  AKI- resolved   Anemia Low folate Hypoalbuminemia' Gastric bypass Seen by nutritionist and ordered tests  Extreme obesity  PT/OT   Discussed the management with patient and hospitalist and his nurse

## 2022-02-14 DIAGNOSIS — R197 Diarrhea, unspecified: Secondary | ICD-10-CM

## 2022-02-14 DIAGNOSIS — N179 Acute kidney failure, unspecified: Secondary | ICD-10-CM | POA: Diagnosis not present

## 2022-02-14 DIAGNOSIS — M6282 Rhabdomyolysis: Secondary | ICD-10-CM | POA: Diagnosis present

## 2022-02-14 DIAGNOSIS — E878 Other disorders of electrolyte and fluid balance, not elsewhere classified: Secondary | ICD-10-CM

## 2022-02-14 LAB — CBC
HCT: 28.4 % — ABNORMAL LOW (ref 39.0–52.0)
Hemoglobin: 9.3 g/dL — ABNORMAL LOW (ref 13.0–17.0)
MCH: 26.5 pg (ref 26.0–34.0)
MCHC: 32.7 g/dL (ref 30.0–36.0)
MCV: 80.9 fL (ref 80.0–100.0)
Platelets: 469 10*3/uL — ABNORMAL HIGH (ref 150–400)
RBC: 3.51 MIL/uL — ABNORMAL LOW (ref 4.22–5.81)
RDW: 15.1 % (ref 11.5–15.5)
WBC: 24.3 10*3/uL — ABNORMAL HIGH (ref 4.0–10.5)
nRBC: 0 % (ref 0.0–0.2)

## 2022-02-14 LAB — BASIC METABOLIC PANEL
Anion gap: 8 (ref 5–15)
Anion gap: 6 (ref 5–15)
BUN: 6 mg/dL (ref 6–20)
BUN: 6 mg/dL (ref 6–20)
CO2: 31 mmol/L (ref 22–32)
CO2: 32 mmol/L (ref 22–32)
Calcium: 5.2 mg/dL — CL (ref 8.9–10.3)
Calcium: 5.2 mg/dL — CL (ref 8.9–10.3)
Chloride: 95 mmol/L — ABNORMAL LOW (ref 98–111)
Chloride: 94 mmol/L — ABNORMAL LOW (ref 98–111)
Creatinine, Ser: 0.48 mg/dL — ABNORMAL LOW (ref 0.61–1.24)
Creatinine, Ser: 0.51 mg/dL — ABNORMAL LOW (ref 0.61–1.24)
GFR, Estimated: >60 mL/min (ref >60)
GFR, Estimated: >60 mL/min (ref >60)
Glucose, Bld: 115 mg/dL — ABNORMAL HIGH (ref 70–99)
Glucose, Bld: 138 mg/dL — ABNORMAL HIGH (ref 70–99)
Potassium: 2.8 mmol/L — ABNORMAL LOW (ref 3.5–5.1)
Potassium: 3.2 mmol/L — ABNORMAL LOW (ref 3.5–5.1)
Sodium: 134 mmol/L — ABNORMAL LOW (ref 135–145)
Sodium: 132 mmol/L — ABNORMAL LOW (ref 135–145)

## 2022-02-14 LAB — CK: Total CK: 3448 U/L — ABNORMAL HIGH (ref 49–397)

## 2022-02-14 LAB — PHOSPHORUS
Phosphorus: 2 mg/dL — ABNORMAL LOW (ref 2.5–4.6)
Phosphorus: 1.7 mg/dL — ABNORMAL LOW (ref 2.5–4.6)

## 2022-02-14 LAB — MAGNESIUM
Magnesium: 1.9 mg/dL (ref 1.7–2.4)
Magnesium: 1.9 mg/dL (ref 1.7–2.4)

## 2022-02-14 LAB — PARATHYROID HORMONE, INTACT (NO CA): PTH: 79 pg/mL — ABNORMAL HIGH (ref 15–65)

## 2022-02-14 MED ORDER — LINEZOLID 600 MG PO TABS
600.0000 mg | ORAL_TABLET | Freq: Two times a day (BID) | ORAL | Status: DC
  Administered 2022-02-14 – 2022-02-17 (×7): 600 mg via ORAL
  Filled 2022-02-14 (×8): qty 1

## 2022-02-14 MED ORDER — POTASSIUM PHOSPHATES 15 MMOLE/5ML IV SOLN
30.0000 mmol | Freq: Once | INTRAVENOUS | Status: AC
  Administered 2022-02-14: 30 mmol via INTRAVENOUS
  Filled 2022-02-14: qty 10

## 2022-02-14 MED ORDER — POTASSIUM & SODIUM PHOSPHATES 280-160-250 MG PO PACK
2.0000 | PACK | Freq: Three times a day (TID) | ORAL | Status: AC
  Administered 2022-02-14 – 2022-02-15 (×3): 2 via ORAL
  Filled 2022-02-14 (×3): qty 2

## 2022-02-14 MED ORDER — POTASSIUM CHLORIDE 10 MEQ/100ML IV SOLN
10.0000 meq | INTRAVENOUS | Status: AC
  Administered 2022-02-14 (×4): 10 meq via INTRAVENOUS
  Filled 2022-02-14 (×4): qty 100

## 2022-02-14 MED ORDER — CALCIUM GLUCONATE-NACL 2-0.675 GM/100ML-% IV SOLN
2.0000 g | Freq: Once | INTRAVENOUS | Status: AC
  Administered 2022-02-14: 2000 mg via INTRAVENOUS
  Filled 2022-02-14: qty 100

## 2022-02-14 NOTE — Assessment & Plan Note (Addendum)
CK was more than 5000 on admission-> 3448.  Recheck pending from today Continue aggressive IV hydration No clear etiology for this as patient is bedbound at baseline.  Could be just from laying in the bed

## 2022-02-14 NOTE — Assessment & Plan Note (Signed)
Pharmacy actively managing this

## 2022-02-14 NOTE — Progress Notes (Addendum)
° °  Date of Admission:  02/10/2022      ID: Christopher Herman is a 46 y.o. male Principal Problem:   Sepsis ([place]) Active Problems:   Vitamin D deficiency   Obesity, morbid ([place])   AKI (acute kidney injury) ([place])   Electrolyte abnormality   Rhabdomyolysis    Subjective: Feeling better But still weak Tmax 100  Medications:   calcium carbonate  1 tablet Oral TID WC   enoxaparin (LOVENOX) injection  0.5 mg/kg Subcutaneous Q24H   linezolid  600 mg Oral Q12H   multivitamin with minerals  1 tablet Oral BID   potassium & sodium phosphates  2 packet Oral TID AC & HS   Ensure Max Protein  11 oz Oral QHS    Objective: Vital signs in last 24 hours: Temp:  [98 F (36.7 C)-100 F (37.8 C)] 99 F (37.2 C) (02/17 2009) Pulse Rate:  [107-116] 116 (02/17 2009) Resp:  [16-17] 16 (02/17 2009) BP: (104-116)/(64-72) 116/66 (02/17 2009) SpO2:  [95 %-100 %] 98 % (02/17 2009)  PHYSICAL EXAM:  General: Alert, cooperative, no distress, Lungs: Clear to auscultation bilaterally. No Wheezing or Rhonchi. No rales. Heart: Regular rate and rhythm, no murmur, rub or gallop. Abdomen: Soft, non-tender,not distended. Bowel sounds normal. No masses Extremities: severe lipedema/lymphedema of the thigh b/l Back: patient was turned to the side with help of multiple staff- skin tears, wetness- not able to examine properly though Lymph: Cervical, supraclavicular normal. Neurologic: Grossly non-focal  Lab Results Recent Labs    02/13/22 0509 02/14/22 0500 02/14/22 1353  WBC 23.8* 24.3*  --   HGB 9.7* 9.3*  --   HCT 29.7* 28.4*  --   NA 131* 134* 132*  K 3.2* 2.8* 3.2*  CL 94* 95* 94*  CO2 28 31 32  BUN 11 6 6   CREATININE 0.61 0.48* 0.51*   Liver Panel Recent Labs    02/13/22 0509 02/13/22 1526  PROT  --  6.0*  ALBUMIN 1.6* 1.7*  AST  --  48*  ALT  --  15  ALKPHOS  --  32  BILITOT  --  0.6  BILIDIR  --  0.1  IBILI  --  0.5   Microbiology: BC-NG UC-NG     Assessment/Plan: 89yr  male presented with subjective fever, weakness, dizziness, diarrhea and sepsis like picture Severe leucocytosis, AKI, hyponatremia, anemia, hypoalbuminemia, hypokalemia, low vitamin D, low folate  Unclear source for sepsis Skin? UTI?  Pt is on ceftriaxone and some improvement Will add linezolid Diarrhea resolved- Cdiff/GI panel negative   CT abdomen and pelvis could not be done due to body habitus  AKI- resolved   Anemia Low folate Hypoalbuminemia' Increased LDH- no hemolysis Increase in CPK could be from immobility  Gastric bypass Seen by nutritionist and ordered tests  Extreme obesity  PT/OT   Discussed the management with patient and hospitalist  Will follow him peripherally this weekend- call if needed

## 2022-02-14 NOTE — Progress Notes (Addendum)
Progress Note   Patient: Christopher Herman ZGY:[id number] DOB: 21-Jun-1976 DOA: 02/10/2022     3 DOS: the patient was seen and examined on 02/14/2022   Brief hospital course: 46 y.o. male with medical history significant for severe obesity with BMI of 99.85, obstructive sleep apnea, essential hypertension, GERD, cholelithiasis, bilateral lower extremity lipoma, status post IVC filter and chronic lymphedema, who presented to the ER with acute onset of diarrhea that occurs on Saturday as well as nausea without vomiting and significantly diminished appetite.  Admitted for suspected sepsis  2/15: Requesting hospital bed and bariatric walker.  Denies any further diarrhea.  Feeling very weak  2/16: Had 2-3 loose bowel movements 2/17: Low-grade fever, tachycardia.  ID added Zyvox   Assessment and Plan: * Sepsis ([place])- (present on admission) No clear source.  Possible urine Continue IV Rocephin for now.  ID added Zyvox today.  Has low-grade fever and sinus tach today Unable to get CT due to his body size  Rhabdomyolysis- (present on admission) CK was more than 5000 on admission-> 3448 Continue aggressive IV hydration No clear etiology for this as patient is bedbound at baseline  Electrolyte abnormality- (present on admission) Pharmacy actively managing this  AKI (acute kidney injury) ([place])- (present on admission) Likely prerenal with nausea vomiting diarrhea and poor p.o. intake.  Resolved with IV hydration  Obesity, morbid ([place])- (present on admission) He is bedbound at baseline.  He is requesting bariatric wheelchair/walker and hospital bed.  He is planning to go back home at discharge  Vitamin D deficiency- (present on admission) Pharmacy managing this and all electrolytes        Subjective: Has low-grade fever and sinus tach.  Worried about his work (from home) and requesting work excuse.  Electrolyte imbalance remains and being aggressively replaced  Physical Exam: Vitals:    02/14/22 0334 02/14/22 0812 02/14/22 1156 02/14/22 1531  BP: 111/72 104/64 116/71 116/66  Pulse: (!) 114 (!) 107 (!) 109 (!) 109  Resp: 17 17 17 16   Temp: 98 F (36.7 C) 100 F (37.8 C) 99 F (37.2 C) 99.7 F (37.6 C)  TempSrc: Oral Oral  Oral  SpO2: 95% 100% 98% 97%  Weight:      Height:       GENERAL:  46 y.o.-year-old morbidly obese African-American male patient lying in the bed with no acute distress.  EYES: Pupils equal, round, reactive to light and accommodation. No scleral icterus. Extraocular muscles intact.  HEENT: Head atraumatic, normocephalic. Oropharynx and nasopharynx clear.  NECK:  Supple, no jugular venous distention. No thyroid enlargement, no tenderness.  LUNGS: Normal breath sounds bilaterally, no wheezing, rales,rhonchi or crepitation. No use of accessory muscles of respiration.  CARDIOVASCULAR: Regular rate and rhythm, S1, S2 normal. No murmurs, rubs, or gallops.  ABDOMEN: Soft, nondistended, nontender. Bowel sounds present. No organomegaly or mass.  EXTREMITIES: Chronic lymphedema with lichenification and elephantiasis with no cyanosis, or clubbing.  He has bilateral medial thigh large lipomas shown below. NEUROLOGIC: Cranial nerves II through XII are intact. Muscle strength 5/5 in all extremities. Sensation intact. Gait not checked.  PSYCHIATRIC: The patient is alert and oriented x 3.  Normal affect and good eye contact.  Data Reviewed:  Low calcium, phosphorus, elevated PTH and CK  Family Communication: None at bedside  Disposition: Status is: Inpatient Remains inpatient appropriate because: Electrolyte imbalance, low-grade fever and sinus tachycardia    DVT prophylaxis-Lovenox subcu       Planned Discharge Destination: Home  Time spent: 35 minutes  Author: Max Sane, MD 02/14/2022 3:40 PM  For on call review [email or web address].

## 2022-02-14 NOTE — Assessment & Plan Note (Signed)
Pharmacy managing this and all electrolytes

## 2022-02-14 NOTE — Consult Note (Signed)
PHARMACY CONSULT NOTE  Pharmacy Consult for Electrolyte Monitoring and Replacement   Recent Labs: Potassium (mmol/L)  Date Value  02/14/2022 2.8 (L)  02/11/2012 3.8   Magnesium (mg/dL)  Date Value  02/14/2022 1.9   Calcium (mg/dL)  Date Value  02/14/2022 5.2 (LL)   Calcium, Total (mg/dL)  Date Value  02/11/2012 8.7   Albumin (g/dL)  Date Value  02/13/2022 1.7 (L)  02/11/2012 3.3 (L)   Phosphorus (mg/dL)  Date Value  02/14/2022 2.0 (L)   Sodium (mmol/L)  Date Value  02/14/2022 134 (L)  02/11/2012 140   Assessment: 47 year old male with past medical history of severe obesity (BMI 99.85), obstructive sleep apnea, HTN, GERD, cholelithiasis, bilateral lower extremity lipoma, s/p IVC filter and chronic lymphedema presented to ED with acute onset diarrhea, nausea with vomiting, and diminished appetite. Patient is septic with no known cause and found to have AKI on admission and CK significantly elevated with rhabdomyolysis. Pharmacy consulted for electrolyte monitoring and replacement.   MIVF: sodium bicarb 150 mEq @ 100 mL/hr  Goal of Therapy:  Electrolytes within normal limits  Plan:  --K 2.8, received Kcl 40 mEq tab x 1 yesterday, will give Kcl 40 mEq IV --Phos 2.0, will give K-phos 30 mmol IV x 1 (contains ~44 mEq K) --Corrected Ca: 7.0; will give calcium gluconate 2 g IV x 1 --Follow up with 1400 labs today  Owens Loffler, PharmD Candidate 02/14/2022 7:31 AM

## 2022-02-14 NOTE — Assessment & Plan Note (Signed)
Likely prerenal with nausea vomiting diarrhea and poor p.o. intake.  Resolved with IV hydration

## 2022-02-14 NOTE — Progress Notes (Signed)
Cross Cover Critical low calcium - additional 2 gm calcium gluconate IV ordered

## 2022-02-14 NOTE — Consult Note (Addendum)
PHARMACY CONSULT NOTE  Pharmacy Consult for Electrolyte Monitoring and Replacement   Recent Labs: Potassium (mmol/L)  Date Value  02/14/2022 3.2 (L)  02/11/2012 3.8   Magnesium (mg/dL)  Date Value  02/14/2022 1.9   Calcium (mg/dL)  Date Value  02/14/2022 5.2 (LL)   Calcium, Total (mg/dL)  Date Value  02/11/2012 8.7   Albumin (g/dL)  Date Value  02/13/2022 1.7 (L)  02/11/2012 3.3 (L)   Phosphorus (mg/dL)  Date Value  02/14/2022 1.7 (L)   Sodium (mmol/L)  Date Value  02/14/2022 132 (L)  02/11/2012 140   Assessment: 46 year old male with past medical history of severe obesity (BMI 99.85), obstructive sleep apnea, HTN, GERD, cholelithiasis, bilateral lower extremity lipoma, s/p IVC filter and chronic lymphedema presented to ED with acute onset diarrhea, nausea with vomiting, and diminished appetite. Patient is septic with no known cause and found to have AKI on admission and CK significantly elevated with rhabdomyolysis. Pharmacy consulted for electrolyte monitoring and replacement.   MIVF: sodium bicarb 150 mEq @ 100 mL/hr (stopped 2/17) + NS @125ml /hr  Goal of Therapy:  Electrolytes within normal limits  Plan:  NOTE: Planning to check q12h labs initially until stability in labs is noted. Adjust schedule as appropriate.  --Na [id number]; continue on NS gtt and add Phos-NaK (see below) --K 2.8>3.2, Trend improving, repeat KCl 40 mEq IV in addition to that received from [place] below --Phos 2.0>1.8, finishing infusion of K-phos 30 mmol IV x 1 (contains ~44 mEq K); add additional Phos-NaK 2 packets TID x3 doses. --Corrected Ca: 7.0; stable, repeat calcium gluconate 2 g IV x 1 --Follow up with 0500 labs tomorrow  Lorna Dibble, PharmD, [place] Pharmacist 02/14/2022 3:43 PM

## 2022-02-14 NOTE — Assessment & Plan Note (Addendum)
No clear source.  Possible urine Continue IV Rocephin for now.  ID added Zyvox today.  Has low-grade fever and sinus tach today Unable to get CT due to his body size

## 2022-02-14 NOTE — TOC Progression Note (Addendum)
Transition of Care [facility]) - Progression Note    Patient Details  Name: ARACELI COUFAL MRN: [id number] Date of Birth: 09/18/1976  Transition of Care [facility]) CM/SW [place], LCSW Phone Number: 02/14/2022, 11:43 AM  Clinical Narrative:   Faxed paperwork to [facility].  11:48 am: Patient said no one would be home to accept DME delivery today but his mother would be home to accept over the weekend. Gave [employer] representative her phone number so they can coordinate.  Expected Discharge Plan: Home/Self Care Barriers to Discharge: Continued Medical Work up  Expected Discharge Plan and Services Expected Discharge Plan: Home/Self Care       Living arrangements for the past 2 months: Single Family Home                 DME Arranged: Wheelchair manual, Hospital bed DME Agency: [employer] Date DME Agency Contacted: 02/12/22 Time DME Agency Contacted: [phone] Representative spoke with at DME Agency: Thedore Mins             Social Determinants of Health ([place]) Interventions    Readmission Risk Interventions No flowsheet data found.

## 2022-02-15 LAB — CBC
HCT: 28.9 % — ABNORMAL LOW (ref 39.0–52.0)
Hemoglobin: 9.4 g/dL — ABNORMAL LOW (ref 13.0–17.0)
MCH: 26.7 pg (ref 26.0–34.0)
MCHC: 32.5 g/dL (ref 30.0–36.0)
MCV: 82.1 fL (ref 80.0–100.0)
Platelets: 492 10*3/uL — ABNORMAL HIGH (ref 150–400)
RBC: 3.52 MIL/uL — ABNORMAL LOW (ref 4.22–5.81)
RDW: 15.4 % (ref 11.5–15.5)
WBC: 27.4 10*3/uL — ABNORMAL HIGH (ref 4.0–10.5)
nRBC: 0 % (ref 0.0–0.2)

## 2022-02-15 LAB — CK: Total CK: 2127 U/L — ABNORMAL HIGH (ref 49–397)

## 2022-02-15 LAB — MAGNESIUM: Magnesium: 1.7 mg/dL (ref 1.7–2.4)

## 2022-02-15 LAB — BASIC METABOLIC PANEL WITH GFR
Anion gap: 8 (ref 5–15)
BUN: 7 mg/dL (ref 6–20)
CO2: 31 mmol/L (ref 22–32)
Calcium: 5.2 mg/dL — CL (ref 8.9–10.3)
Chloride: 92 mmol/L — ABNORMAL LOW (ref 98–111)
Creatinine, Ser: 0.53 mg/dL — ABNORMAL LOW (ref 0.61–1.24)
GFR, Estimated: >60 mL/min (ref >60)
Glucose, Bld: 115 mg/dL — ABNORMAL HIGH (ref 70–99)
Potassium: 3.3 mmol/L — ABNORMAL LOW (ref 3.5–5.1)
Sodium: 131 mmol/L — ABNORMAL LOW (ref 135–145)

## 2022-02-15 LAB — PHOSPHORUS: Phosphorus: 2.3 mg/dL — ABNORMAL LOW (ref 2.5–4.6)

## 2022-02-15 MED ORDER — POTASSIUM CHLORIDE CRYS ER 20 MEQ PO TBCR
40.0000 meq | EXTENDED_RELEASE_TABLET | Freq: Two times a day (BID) | ORAL | Status: AC
  Administered 2022-02-15 (×2): 40 meq via ORAL
  Filled 2022-02-15 (×2): qty 2

## 2022-02-15 MED ORDER — K PHOS MONO-SOD PHOS DI & MONO 155-852-130 MG PO TABS
500.0000 mg | ORAL_TABLET | ORAL | Status: AC
  Administered 2022-02-15 (×3): 500 mg via ORAL
  Filled 2022-02-15 (×4): qty 2

## 2022-02-15 MED ORDER — MAGNESIUM SULFATE 2 GM/50ML IV SOLN
2.0000 g | Freq: Once | INTRAVENOUS | Status: AC
  Administered 2022-02-15: 2 g via INTRAVENOUS
  Filled 2022-02-15: qty 50

## 2022-02-15 MED ORDER — CALCIUM GLUCONATE-NACL 2-0.675 GM/100ML-% IV SOLN
2.0000 g | Freq: Once | INTRAVENOUS | Status: AC
  Administered 2022-02-15: 2000 mg via INTRAVENOUS
  Filled 2022-02-15: qty 100

## 2022-02-15 NOTE — Assessment & Plan Note (Signed)
He is bedbound at baseline.  He is requesting bariatric wheelchair/walker and hospital bed.  He is planning to go back home at discharge He has had bariatric surgery in 2013 with some initial weight loss but that regained

## 2022-02-15 NOTE — Consult Note (Signed)
PHARMACY CONSULT NOTE  Pharmacy Consult for Electrolyte Monitoring and Replacement   Recent Labs: Potassium (mmol/L)  Date Value  02/15/2022 3.3 (L)  02/11/2012 3.8   Magnesium (mg/dL)  Date Value  02/15/2022 1.7   Calcium (mg/dL)  Date Value  02/15/2022 5.2 (LL)   Calcium, Total (mg/dL)  Date Value  02/11/2012 8.7   Albumin (g/dL)  Date Value  02/13/2022 1.7 (L)  02/11/2012 3.3 (L)   Phosphorus (mg/dL)  Date Value  02/15/2022 2.3 (L)   Sodium (mmol/L)  Date Value  02/15/2022 131 (L)  02/11/2012 140   Assessment: 46 year old male with past medical history of severe obesity (BMI 99.85), obstructive sleep apnea, HTN, GERD, cholelithiasis, bilateral lower extremity lipoma, s/p IVC filter and chronic lymphedema presented to ED with acute onset diarrhea, nausea with vomiting, and diminished appetite. Patient is septic with no known cause and found to have AKI on admission and CK significantly elevated with rhabdomyolysis. Pharmacy consulted for electrolyte monitoring and replacement.   MIVF: + NS @125ml /hr  Goal of Therapy:  Electrolytes within normal limits  Plan:  - Ca gluconate 2 g IV x 1 and continue calcium carbonate TID.  - Mg 2 g IV x 1  - Kcl 40 mEq x 2  - Kphos 2 tabs x 2 - F/u with AM labs.   Eleonore Chiquito, PharmD, Clinical Pharmacist 02/15/2022 9:08 AM

## 2022-02-15 NOTE — Progress Notes (Signed)
Progress Note   Patient: Christopher Herman ZOX:[id number] DOB: 02-Apr-1976 DOA: 02/10/2022     4 DOS: the patient was seen and examined on 02/15/2022   Brief hospital course: 46 y.o. male with medical history significant for severe obesity with BMI of 99.85, obstructive sleep apnea, essential hypertension, GERD, cholelithiasis, bilateral lower extremity lipoma, status post IVC filter and chronic lymphedema, who presented to the ER with acute onset of diarrhea that occurs on Saturday as well as nausea without vomiting and significantly diminished appetite.  Admitted for suspected sepsis  2/15: Requesting hospital bed and bariatric walker.  Denies any further diarrhea.  Feeling very weak  2/16: Had 2-3 loose bowel movements 2/17: Low-grade fever, tachycardia.  ID added Zyvox 2/18: Getting electrolytes replaced, WBC worsening.,  Hypotensive and tachycardic.  He reports some loose bowel movement last night   Assessment and Plan: * Sepsis ([place])- (present on admission) No clear source.  Possible urine/ skin Continue IV Rocephin and Zyvox.  Has low-grade fever, hypotension and sinus tach Unable to get CT due to his body size.  ID following  Rhabdomyolysis- (present on admission) CK was more than 5000 on admission-> 3448.  Recheck pending from today Continue aggressive IV hydration No clear etiology for this as patient is bedbound at baseline.  Could be just from laying in the bed  Electrolyte abnormality- (present on admission) Pharmacy actively managing this  AKI (acute kidney injury) ([place])- (present on admission) Likely prerenal with nausea vomiting diarrhea and poor p.o. intake.  Resolved with IV hydration  Obesity, morbid ([place])- (present on admission) He is bedbound at baseline.  He is requesting bariatric wheelchair/walker and hospital bed.  He is planning to go back home at discharge He has had bariatric surgery in 2013 with some initial weight loss but that regained  Vitamin D  deficiency- (present on admission) Pharmacy managing this and all electrolytes        Subjective: Reports some loose watery bowel movement last night.  Blood pressure borderline low.  Tachycardic.  Physical Exam: Vitals:   02/14/22 2344 02/15/22 0355 02/15/22 0805 02/15/22 1142  BP: 110/70 101/62 (!) 106/58 (!) 100/58  Pulse: (!) 111 (!) 114 (!) 109 (!) 107  Resp: 20 17 18 18   Temp: 99.4 F (37.4 C) 98.7 F (37.1 C) 98.9 F (37.2 C) 98.7 F (37.1 C)  TempSrc:      SpO2: 97% 93% 94% 94%  Weight:      Height:       GENERAL:46 y.o.-year-oldmorbidly obese African-American malepatient lying in the bed with no acute distress.  EYES: Pupils equal, round, reactive to light and accommodation. No scleral icterus. Extraocular muscles intact.  HEENT: Head atraumatic, normocephalic. Oropharynx and nasopharynx clear.  NECK: Supple, no jugular venous distention. No thyroid enlargement, no tenderness.  LUNGS: Normal breath sounds bilaterally, no wheezing, rales,rhonchi or crepitation. No use of accessory muscles of respiration.  CARDIOVASCULAR: Regular rate and rhythm, S1, S2 normal. No murmurs, rubs, or gallops.  ABDOMEN: Soft, nondistended, nontender. Bowel sounds present. No organomegaly or mass.  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 He has bilateral medial thigh large lipomas shown below. NEUROLOGIC: Cranial nerves II through XII are intact. Muscle strength 5/5 in all extremities. Sensation intact. Gait not checked.  PSYCHIATRIC: The patient is alert and oriented x 3. Normal affect and good eye contact.  Data Reviewed:  Sodium 131, potassium 3.3, phosphorus 2.3, WBC 27.4  Family Communication: Updated mother over phone  Disposition: Status is:  Inpatient Remains inpatient appropriate because: Getting electrolytes replaced.  Leukocytosis worsening getting IV antibiotics     DVT prophylaxis-Lovenox  subcu      Planned Discharge Destination: Home with Home Health     Time spent: 35 minutes  Author: Max Sane, MD 02/15/2022 12:26 PM  For on call review [email or web address].

## 2022-02-15 NOTE — Assessment & Plan Note (Signed)
Pharmacy managing this and all electrolytes

## 2022-02-15 NOTE — Assessment & Plan Note (Signed)
No clear source.  Possible urine/ skin Continue IV Rocephin and Zyvox.  Has low-grade fever, hypotension and sinus tach Unable to get CT due to his body size.  ID following

## 2022-02-15 NOTE — Evaluation (Signed)
Physical Therapy Evaluation Patient Details Name: LADAVION SAVITZ MRN: [id number] DOB: Aug 26, 1976 Today's Date: 02/15/2022  History of Present Illness  Pt is a 46 y.o. male presenting to hospital 2/13 with c/o acute onset diarrhea, nausea, generalized weakness, and fatigue.  Pt admitted with sepsis, acute kidney injury, metabolic acidosis, and hypocalcemia.  PMH includes severe obesity (BMI 99.85), OSA, htn, GERD, B LE lipoma, s/p IVC filter and chronic lymphedema.  Clinical Impression  Prior to hospital admission, pt was able to stand briefly with B UE support (up to 10 seconds) and lives with his mom in 1 level home with 6 STE B railings (pt reports not leaving his home since 2020).  Pt's mobility started to decline at the beginning of COVID and has been non-ambulatory since Summer of 2022.  Pt's goals include sitting on edge of bed and work towards standing again.  Currently pt is able to logroll towards R side minimally with use of bed rail.  Generalized weakness noted.  Pt reporting he would feel more comfortable attempting standing if he had his shoes to wear (vs hospital socks) and reports his mom is bringing his shoes hopefully tonight.  Pt's calcium noted to be 5.2 today.  Deferred further exertional activity d/t low calcium.  Pt would benefit from skilled PT to address noted impairments and functional limitations (see below for any additional details).  Upon hospital discharge, pt would benefit from SNF to improve overall strength and progress to w/c level modified independence.    Recommendations for follow up therapy are one component of a multi-disciplinary discharge planning process, led by the attending physician.  Recommendations may be updated based on patient status, additional functional criteria and insurance authorization.  Follow Up Recommendations Skilled nursing-short term rehab (<3 hours/day)    Assistance Recommended at Discharge Frequent or constant Supervision/Assistance   Patient can return home with the following  Two people to help with walking and/or transfers;Two people to help with bathing/dressing/bathroom;Assistance with cooking/housework;Assist for transportation;Help with stairs or ramp for entrance    Equipment Recommendations Rolling walker (2 wheels);BSC/3in1;Wheelchair (measurements PT);Wheelchair cushion (measurements PT);Hospital bed;Other (comment) (hoyer lift; all DME bariatric)  Recommendations for Other Services       Functional Status Assessment Patient has had a recent decline in their functional status and demonstrates the ability to make significant improvements in function in a reasonable and predictable amount of time.     Precautions / Restrictions Precautions Precautions: Fall Restrictions Weight Bearing Restrictions: No      Mobility  Bed Mobility Overal bed mobility: Needs Assistance Bed Mobility: Rolling Rolling: Total assist         General bed mobility comments: Pt with minimal movement towards R side with logrolling in bed    Transfers                   General transfer comment: Not appropriate at this time    Ambulation/Gait                  Stairs            Wheelchair Mobility    Modified Rankin (Stroke Patients Only)       Balance                                             Pertinent Vitals/Pain Pain Assessment Pain Assessment: 0-10 Faces  Pain Scale: Hurts a little bit Pain Location: B knees with movement Pain Descriptors / Indicators: Aching Pain Intervention(s): Limited activity within patient's tolerance, Monitored during session, Repositioned    Home Living Family/patient expects to be discharged to:: Private residence Living Arrangements: Parent (pt's mom) Available Help at Discharge: Family Type of Home: House Home Access: Stairs to enter Entrance Stairs-Rails: Right;Left;Can reach both Entrance Stairs-Number of Steps: 6   Home  Layout: One level Home Equipment: Other (comment);Hospital bed Additional Comments: Pt reports hospital bed was delivered today (was sleeping in a queen bed without a railing)    Prior Function Prior Level of Function : Needs assist       Physical Assist : Mobility (physical) Mobility (physical): Transfers   Mobility Comments: Pt reports 1 week ago was able to stand (UE support on bin) for no more than 10 seconds.  Last time pt able to ambulate was July/August of 2022 (10-15 feet bed to bathroom) but stopped walking d/t legs feeling week and concerns for falling.  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 ADLs Comments: Per OT eval "At baseline, pt reports that he cleans his skin folds with wet wipes, uses a urinal in the bed, and voids for a BM in a chuck sheet.  Pt wears a hospital gown at home.  Mother brings him meals.  Pt does not use a BSC and has been non-ambulatory since Summer of 2022."     Hand Dominance        Extremity/Trunk Assessment   Upper Extremity Assessment Upper Extremity Assessment: Generalized weakness    Lower Extremity Assessment Lower Extremity Assessment:  (at least 3/5 AROM ankle DF/PF; fair B quad set isometric strength; at least 2+/5 B hip flexors and B hip aBDuctors noted)    Cervical / Trunk Assessment Cervical / Trunk Assessment: Normal  Communication   Communication: No difficulties  Cognition Arousal/Alertness: Awake/alert   Overall Cognitive Status: Within Functional Limits for tasks assessed                                 General Comments: A&Ox4        General Comments General comments (skin integrity, edema, etc.): B thigh large lipomas and chronic B LE lymphedema    Exercises     Assessment/Plan    PT Assessment Patient needs continued PT services  PT Problem List Decreased strength;Decreased activity tolerance;Decreased  balance;Decreased mobility;Decreased knowledge of use of DME;Decreased knowledge of precautions;Obesity       PT Treatment Interventions DME instruction;Gait training;Functional mobility training;Therapeutic activities;Stair training;Therapeutic exercise;Balance training;Patient/family education;Wheelchair mobility training    PT Goals (Current goals can be found in the Care Plan section)  Acute Rehab PT Goals Patient Stated Goal: to improve strength and mobility PT Goal Formulation: With patient Time For Goal Achievement: 03/01/22 Potential to Achieve Goals: Fair    Frequency Min 2X/week     Co-evaluation               AM-PAC PT "6 Clicks" Mobility  Outcome Measure Help needed turning from your back to your side while in a flat bed without using bedrails?: Total Help needed moving from lying on your back to sitting on the side of a flat bed without using bedrails?: Total Help needed moving to and from a bed to a chair (including a  wheelchair)?: Total Help needed standing up from a chair using your arms (e.g., wheelchair or bedside chair)?: Total Help needed to walk in hospital room?: Total Help needed climbing 3-5 steps with a railing? : Total 6 Click Score: 6    End of Session   Activity Tolerance: Patient tolerated treatment well Patient left: in bed;with call bell/phone within reach;with family/visitor present   PT Visit Diagnosis: Other abnormalities of gait and mobility (R26.89);Muscle weakness (generalized) (M62.81)    Time: 5974-7185 PT Time Calculation (min) (ACUTE ONLY): 14 min   Charges:   PT Evaluation $PT Eval Low Complexity: 1 Low         Adlean Hardeman, PT 02/15/22, 5:23 PM

## 2022-02-15 NOTE — Assessment & Plan Note (Signed)
Likely prerenal with nausea vomiting diarrhea and poor p.o. intake.  Resolved with IV hydration

## 2022-02-15 NOTE — Progress Notes (Incomplete)
Occupational Therapy Evaluation Patient Details Name: Christopher Herman MRN: [id number] DOB: 08-07-1976 Today's Date: 02/15/2022   History of Present Illness Christopher Herman is a 46 y.o. male with medical history significant for severe obesity with BMI of 99.85, obstructive sleep apnea, essential hypertension, GERD, cholelithiasis, bilateral lower extremity lipoma, status post IVC filter and chronic lymphedema, who presented to the ER with acute onset of diarrhea that occurs on Saturday as well as nausea without vomiting and significantly diminished appetite.  He stated that he lost sensation to urinate for the last 17 hours before his presentation.  Prior to that he admitted to urinary frequency and urgency without hematuria or flank pain.  He has been feeling generalized weakness and fatigue.   Clinical Impression   ***      Recommendations for follow up therapy are one component of a multi-disciplinary discharge planning process, led by the attending physician.  Recommendations may be updated based on patient status, additional functional criteria and insurance authorization.   Follow Up Recommendations       Assistance Recommended at Discharge    Patient can return home with the following      Functional Status Assessment     Equipment Recommendations       Recommendations for Other Services       Precautions / Restrictions Precautions Precautions: Fall Restrictions Weight Bearing Restrictions: No      Mobility Bed Mobility                    Transfers                          Balance                                           ADL either performed or assessed with clinical judgement   ADL                                               Vision         Perception     Praxis      Pertinent Vitals/Pain Pain Assessment Pain Assessment: Faces Faces Pain Scale: Hurts a little bit Pain Location: bilat knees  when trying to abduct hips in supine Pain Descriptors / Indicators: Aching Pain Intervention(s): Limited activity within patient's tolerance, Monitored during session     Hand Dominance     Extremity/Trunk Assessment             Communication Communication Communication: No difficulties   Cognition Arousal/Alertness: Awake/alert                                           General Comments       Exercises     Shoulder Instructions      Home Living Family/patient expects to be discharged to:: Private residence Living Arrangements: Parent Available Help at Discharge: Family (Pt states his mother would help him with anything he asked her to, but he does not ask his mother to assist with self care, therefore self care is not thoroughly completed at baseline) Type  of Home: House       Home Layout: One level           Bathroom Accessibility: No   Home Equipment: Other (comment);Hospital bed (pt has bariatric 4 wheeled walker but does not currently use)   Additional Comments: pt reports hospital bed was just delivered today, but had been in a queen bed without a rail      Prior Functioning/Environment               Mobility Comments: Pt reports ability to stand 1 week ago at home with BUE support but for no more than 10 sec.  Pt last ambulated short distances 10-15 ft bed to bathroom in July/Aug of '22.  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 ADLs Comments: At baseline, pt reports that he cleans his skin folds with wet wipes, uses a urinal in the bed, and voids for a BM in a chuck sheet.  Pt wears a hospital gown at home.  Mother brings him meals.  Pt does not use a BSC and has been non-ambulatory since Summer of 2022.        OT Problem List:        OT Treatment/Interventions:      OT Goals(Current goals can be found in the care plan section)    OT Frequency:      Co-evaluation               AM-PAC OT "6 Clicks" Daily Activity     Outcome Measure                 End of Session    Activity Tolerance:   Patient left:                     Time:  -    Charges:     {enter signature dotphrase here}  Christopher Herman 02/15/2022, 3:28 PM

## 2022-02-15 NOTE — TOC Progression Note (Signed)
Transition of Care [facility]) - Progression Note    Patient Details  Name: Christopher Herman MRN: [id number] Date of Birth: May 18, 1976  [facility] CM/SW Contact  Zigmund Daniel Dorian Pod, RN Phone Number:[phone] 02/15/2022, 1:44 PM  Clinical Narrative:    Spoke with pt's mother Meredith Mody) and verified the bariatric bed has been delivered and set up in the home. No other needs mentioned to address at this time.   Expected Discharge Plan: Home/Self Care Barriers to Discharge: Continued Medical Work up  Expected Discharge Plan and Services Expected Discharge Plan: Home/Self Care       Living arrangements for the past 2 months: Single Family Home                 DME Arranged: Wheelchair manual, Hospital bed DME Agency: [employer] Date DME Agency Contacted: 02/12/22 Time DME Agency Contacted: [id number] Representative spoke with at DME Agency: Thedore Mins             Social Determinants of Health ([place]) Interventions    Readmission Risk Interventions No flowsheet data found.

## 2022-02-15 NOTE — Evaluation (Signed)
Occupational Therapy Evaluation Patient Details Name: Christopher Herman MRN: [id number] DOB: 1976-02-20 Today's Date: 02/15/2022   History of Present Illness Christopher Herman is a 46 y.o. male with medical history significant for severe obesity with BMI of 99.85, obstructive sleep apnea, essential hypertension, GERD, cholelithiasis, bilateral lower extremity lipoma, status post IVC filter and chronic lymphedema, who presented to the ER with acute onset of diarrhea that occurs on Saturday as well as nausea without vomiting and significantly diminished appetite.  He stated that he lost sensation to urinate for the last 17 hours before his presentation.  Prior to that he admitted to urinary frequency and urgency without hematuria or flank pain.  He has been feeling generalized weakness and fatigue.   Clinical Impression   Pt seen for OT evaluation this date.  Pt has had recent functional decline with ADLs and bed mobility d/t problems noted above.  At baseline, pt stands briefly for <10 sec with BUE support, but has not stood in a week.  Pt reports mobility started to decline when covid hit.  Pt has been non ambulatory since Summer of 2022.  Pt lives with mother who he reports would assist him with ADLs if he asked her to, but pt reports he chooses not to, therefore, self care lacks thoroughness, see eval for details.  Pt had hospital bed delivered today but otherwise only has  4 wheeled walker which he does not use.  Pt's goal is to sit up on EOB and work towards standing before he discharges.  Unable to attempt sitting this day d/t low calcium levels.  Consulted with PT who plans to gather bariatric equipment for attempts at sitting and standing another day.  OT left red theraband and theraputty and instructed pt in strengthening exercises.  OT advised pt on benefits of UB strengthening to work towards improving bed mobility, sitting, and sit to stand transfers.  Pt verbalized understanding.  Pt will benefit  from skilled acute OT for additional strengthening, progressing ADLs at bed level, and working towards EOB sitting and sit to stand.  Recommend SNF at d/c.  Fear that d/c home would result in rehospitalization d/t high risk of pressure ulcers.      Recommendations for follow up therapy are one component of a multi-disciplinary discharge planning process, led by the attending physician.  Recommendations may be updated based on patient status, additional functional criteria and insurance authorization.   Follow Up Recommendations  Skilled nursing-short term rehab (<3 hours/day)    Assistance Recommended at Discharge Frequent or constant Supervision/Assistance  Patient can return home with the following Other (comment) (Pt requires extensive ADL assist and is likely to be bedfast at home, increasing risk for pressure sores and further functional decline.  Pt is non-ambulatory and is not yet able to sit up at EOB.)    Functional Status Assessment  Patient has had a recent decline in their functional status and demonstrates the ability to make significant improvements in function in a reasonable and predictable amount of time.  Equipment Recommendations   (bariatric BSC)           Precautions / Restrictions Precautions Precautions: Fall Restrictions Weight Bearing Restrictions: No      Mobility Bed Mobility Overal bed mobility: Needs Assistance Bed Mobility: Rolling           General bed mobility comments: attempted rolling during OT session; unsuccesful.  Per RN note, 7 person assist to roll Patient Response: Cooperative  Transfers  General transfer comment: unable to attempt transfer or sitting EOB d/t low calcium level this day and therapy needing to gather bariatric equipment to try transfer another day      Balance Overall balance assessment:  (unable to attempt EOB sitting this day d/t low calcium)                                          ADL either performed or assessed with clinical judgement   ADL Overall ADL's : Needs assistance/impaired                                       General ADL Comments: attempted rolling R/L with pt unable to lift either leg; difficulty reaching for side rail with opposite arm and was unable lift any part of UB off bed with rolling attempt     Vision Patient Visual Report: No change from baseline                  Pertinent Vitals/Pain Pain Assessment Pain Assessment: Faces Faces Pain Scale: Hurts a little bit Pain Location: bilat knees when trying to abduct hips in supine Pain Descriptors / Indicators: Aching Pain Intervention(s): Limited activity within patient's tolerance, Monitored during session     Hand Dominance     Extremity/Trunk Assessment Upper Extremity Assessment Upper Extremity Assessment: Generalized weakness   Lower Extremity Assessment Lower Extremity Assessment: Defer to PT evaluation       Communication Communication Communication: No difficulties   Cognition Arousal/Alertness: Awake/alert   Overall Cognitive Status: Within Functional Limits for tasks assessed                                 General Comments: A&Ox4     General Comments  pt has large lipomas at bilat thighs, and chronic BLE lymphedema    Exercises Other Exercises Other Exercises: Performed bilat shoulder horiz abd, elevation, elbow flex/ext with red theraband x2 sets 10 reps each and mod vc for technique. Other Exercises: Issued green theraputty and instructed in gross grasping.  Written instructions provided for theraputty and theraband but would benefit from handout   Shoulder Instructions      Home Living Family/patient expects to be discharged to:: Private residence Living Arrangements: Parent Available Help at Discharge: Family Type of Home: House       Home Layout: One level           Bathroom Accessibility: No    Home Equipment: Other (comment);Hospital bed   Additional Comments: pt reports hospital bed was just delivered today, but had been in a queen bed without a rail                     Mobility Comments: Pt reports ability to stand 1 week ago at home with BUE support but for no more than 10 sec.  Pt last ambulated short distances 10-15 ft bed to bathroom in July/Aug of '22.  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 ADLs Comments: At baseline, pt reports that he cleans his skin folds with wet wipes, uses a urinal in the bed, and voids for a BM in a chuck sheet.  Pt wears  a hospital gown at home.  Mother brings him meals.  Pt does not use a BSC and has been non-ambulatory since Summer of 2022.        OT Problem List: Decreased strength;Decreased knowledge of use of DME or AE;Increased edema;Obesity;Decreased activity tolerance;Pain;Impaired balance (sitting and/or standing)      OT Treatment/Interventions: Self-care/ADL training;Therapeutic exercise;Patient/family education;Balance training;Therapeutic activities;DME and/or AE instruction    OT Goals(Current goals can be found in the care plan section) Acute Rehab OT Goals Patient Stated Goal: To be able to stand up before going home OT Goal Formulation: With patient Time For Goal Achievement: 03/01/22 Potential to Achieve Goals: Fair  OT Frequency: Min 3X/week                  AM-PAC OT "6 Clicks" Daily Activity     Outcome Measure Help from another person eating meals?: None Help from another person taking care of personal grooming?: A Little Help from another person toileting, which includes using toliet, bedpan, or urinal?: Total Help from another person bathing (including washing, rinsing, drying)?: A Lot Help from another person to put on and taking off regular upper body clothing?: A Lot Help from another person to put on and taking off regular lower body clothing?: Total 6  Click Score: 13   End of Session    Activity Tolerance: Other (comment) (limited to bed level activity d/t low calcium) Patient left: in bed;with call bell/phone within reach  OT Visit Diagnosis: Muscle weakness (generalized) (M62.81);Other abnormalities of gait and mobility (R26.89)                Time: 7048-8891 OT Time Calculation (min): 48 min Charges:  OT General Charges $OT Visit: 1 Visit OT Evaluation $OT Eval Moderate Complexity: 1 Mod OT Treatments $Therapeutic Exercise: 8-22 mins  Leta Speller, MS, OTR/L   Christopher Herman 02/15/2022, 3:55 PM

## 2022-02-15 NOTE — Progress Notes (Signed)
Pt declines cpap 

## 2022-02-15 NOTE — Assessment & Plan Note (Signed)
Pharmacy actively managing this

## 2022-02-16 LAB — CULTURE, BLOOD (ROUTINE X 2)
Culture: NO GROWTH
Culture: NO GROWTH
Special Requests: ADEQUATE

## 2022-02-16 LAB — CBC
HCT: 28.2 % — ABNORMAL LOW (ref 39.0–52.0)
Hemoglobin: 9 g/dL — ABNORMAL LOW (ref 13.0–17.0)
MCH: 26.3 pg (ref 26.0–34.0)
MCHC: 31.9 g/dL (ref 30.0–36.0)
MCV: 82.5 fL (ref 80.0–100.0)
Platelets: 480 10*3/uL — ABNORMAL HIGH (ref 150–400)
RBC: 3.42 MIL/uL — ABNORMAL LOW (ref 4.22–5.81)
RDW: 15.5 % (ref 11.5–15.5)
WBC: 27.9 10*3/uL — ABNORMAL HIGH (ref 4.0–10.5)
nRBC: 0.1 % (ref 0.0–0.2)

## 2022-02-16 LAB — PHOSPHORUS: Phosphorus: 3 mg/dL (ref 2.5–4.6)

## 2022-02-16 LAB — BASIC METABOLIC PANEL
Anion gap: 10 (ref 5–15)
BUN: 8 mg/dL (ref 6–20)
CO2: 29 mmol/L (ref 22–32)
Calcium: 5.2 mg/dL — CL (ref 8.9–10.3)
Chloride: 93 mmol/L — ABNORMAL LOW (ref 98–111)
Creatinine, Ser: 0.39 mg/dL — ABNORMAL LOW (ref 0.61–1.24)
GFR, Estimated: >60 mL/min (ref >60)
Glucose, Bld: 97 mg/dL (ref 70–99)
Potassium: 3.8 mmol/L (ref 3.5–5.1)
Sodium: 132 mmol/L — ABNORMAL LOW (ref 135–145)

## 2022-02-16 LAB — CK: Total CK: 1514 U/L — ABNORMAL HIGH (ref 49–397)

## 2022-02-16 LAB — ZINC: Zinc: 35 ug/dL — ABNORMAL LOW (ref 44–115)

## 2022-02-16 LAB — COPPER, SERUM: Copper: 103 ug/dL (ref 69–132)

## 2022-02-16 LAB — MAGNESIUM: Magnesium: 1.7 mg/dL (ref 1.7–2.4)

## 2022-02-16 MED ORDER — MAGNESIUM SULFATE 2 GM/50ML IV SOLN
2.0000 g | Freq: Once | INTRAVENOUS | Status: AC
  Administered 2022-02-16: 2 g via INTRAVENOUS
  Filled 2022-02-16: qty 50

## 2022-02-16 MED ORDER — BISMUTH SUBSALICYLATE 262 MG PO CHEW
524.0000 mg | CHEWABLE_TABLET | Freq: Three times a day (TID) | ORAL | Status: DC
  Filled 2022-02-16 (×2): qty 2

## 2022-02-16 MED ORDER — BISMUTH SUBSALICYLATE 262 MG PO CHEW
524.0000 mg | CHEWABLE_TABLET | Freq: Three times a day (TID) | ORAL | Status: DC
  Administered 2022-02-16 – 2022-02-17 (×4): 524 mg via ORAL
  Filled 2022-02-16 (×2): qty 2

## 2022-02-16 NOTE — Assessment & Plan Note (Signed)
Chronic likely due to bariatric surgery

## 2022-02-16 NOTE — Progress Notes (Signed)
Patient had BM incontinence; needed 6-7 staff to clean patient up.  During incontinence care, patient was noted to have existing multiple open areas. Sacrum, buttocks, R and L posterior thighs, R inner thigh. R inner thigh noted to be draining w/serous sanguinous discharge. All open areas covered with foam dressing. Rachael Fee notified. Wound consult ordered.

## 2022-02-16 NOTE — Progress Notes (Signed)
Physical Therapy Treatment Patient Details Name: Christopher Herman MRN: [id number] DOB: [birth year] Today's Date: 02/16/2022   History of Present Illness Pt is a 46 y.o. male presenting to hospital 2/13 with c/o acute onset diarrhea, nausea, generalized weakness, and fatigue.  Pt admitted with sepsis, acute kidney injury, metabolic acidosis, and hypocalcemia.  PMH includes severe obesity (BMI 99.85), OSA, htn, GERD, B LE lipoma, s/p IVC filter and chronic lymphedema.    PT Comments    Pt resting in bed upon PT arrival; agreeable to PT session.  Discussed pt's low calcium concerns with MD Manuella Ghazi who cleared pt for activity.   Max to total assist to bring R LE off of bed (towards R side) and move L LE towards middle of bed; min to mod assist x2 to long sit in bed (R LE dangling off of bed) x3 trials (pt using L UE on bedrail to assist with sitting up in bed); able to sit up briefly each attempt (10-15 seconds at most) d/t feeling SOB.  HR 108 bpm at rest and up to 130 bpm briefly with activity.  Will continue to focus on strengthening and progressive functional mobility during hospitalization.   Recommendations for follow up therapy are one component of a multi-disciplinary discharge planning process, led by the attending physician.  Recommendations may be updated based on patient status, additional functional criteria and insurance authorization.  Follow Up Recommendations  Skilled nursing-short term rehab (<3 hours/day)     Assistance Recommended at Discharge Frequent or constant Supervision/Assistance  Patient can return home with the following Two people to help with walking and/or transfers;Two people to help with bathing/dressing/bathroom;Assistance with cooking/housework;Assist for transportation;Help with stairs or ramp for entrance   Equipment Recommendations  Rolling walker (2 wheels);BSC/3in1;Wheelchair (measurements PT);Wheelchair cushion (measurements PT);Hospital bed;Other (comment)  (hoyer lift; all DME bariatric)    Recommendations for Other Services       Precautions / Restrictions Precautions Precautions: Fall Restrictions Weight Bearing Restrictions: No     Mobility  Bed Mobility Overal bed mobility: Needs Assistance             General bed mobility comments: max to total assist to bring R LE off of bed (towards R side) and move L LE towards middle of bed; min to mod assist x2 to long sit in bed (R LE dangling off of bed) x3 trials (pt using L UE on bedrail to assist with sitting up in bed); able to sit up briefly each attempt (10-15 seconds at most) d/t feeling SOB    Transfers                   General transfer comment: Not appropriate at this time    Ambulation/Gait                   Stairs             Wheelchair Mobility    Modified Rankin (Stroke Patients Only)       Balance Overall balance assessment: Needs assistance Sitting-balance support: Bilateral upper extremity supported Sitting balance-Leahy Scale: Poor Sitting balance - Comments: requiring assist for long sitting in bed (min assist x2 with UE support)                                    Cognition Arousal/Alertness: Awake/alert Behavior During Therapy: WFL for tasks assessed/performed Overall Cognitive Status: Within Functional Limits for  tasks assessed                                 General Comments: A&Ox4        Exercises      General Comments General comments (skin integrity, edema, etc.): B thigh large lipomas and chronic B LE lymphedema      Pertinent Vitals/Pain Pain Assessment Pain Assessment: No/denies pain Pain Intervention(s): Limited activity within patient's tolerance, Monitored during session, Repositioned.  O2 sats WFL on room air during session.    Home Living                          Prior Function            PT Goals (current goals can now be found in the care plan section)  Acute Rehab PT Goals Patient Stated Goal: to improve strength and mobility PT Goal Formulation: With patient Time For Goal Achievement: 03/01/22 Potential to Achieve Goals: Fair Progress towards PT goals: Progressing toward goals    Frequency    Min 2X/week      PT Plan Current plan remains appropriate    Co-evaluation              AM-PAC PT "6 Clicks" Mobility   Outcome Measure  Help needed turning from your back to your side while in a flat bed without using bedrails?: Total Help needed moving from lying on your back to sitting on the side of a flat bed without using bedrails?: Total Help needed moving to and from a bed to a chair (including a wheelchair)?: Total Help needed standing up from a chair using your arms (e.g., wheelchair or bedside chair)?: Total Help needed to walk in hospital room?: Total Help needed climbing 3-5 steps with a railing? : Total 6 Click Score: 6    End of Session   Activity Tolerance: Patient tolerated treatment well Patient left: in bed;with call bell/phone within reach Nurse Communication: Mobility status;Precautions PT Visit Diagnosis: Other abnormalities of gait and mobility (R26.89);Muscle weakness (generalized) (M62.81)     Time: 8937-3428 PT Time Calculation (min) (ACUTE ONLY): 38 min  Charges:  $Therapeutic Activity: 38-52 mins                    Erhard Senske, PT 02/16/22, 11:15 AM

## 2022-02-16 NOTE — Consult Note (Signed)
PHARMACY CONSULT NOTE  Pharmacy Consult for Electrolyte Monitoring and Replacement   Recent Labs: Potassium (mmol/L)  Date Value  02/16/2022 3.8  02/11/2012 3.8   Magnesium (mg/dL)  Date Value  02/16/2022 1.7   Calcium (mg/dL)  Date Value  02/16/2022 5.2 (LL)   Calcium, Total (mg/dL)  Date Value  02/11/2012 8.7   Albumin (g/dL)  Date Value  02/13/2022 1.7 (L)  02/11/2012 3.3 (L)   Phosphorus (mg/dL)  Date Value  02/16/2022 3.0   Sodium (mmol/L)  Date Value  02/16/2022 132 (L)  02/11/2012 140   Assessment: 46 year old male with past medical history of severe obesity (BMI 99.85), obstructive sleep apnea, HTN, GERD, cholelithiasis, bilateral lower extremity lipoma, s/p IVC filter and chronic lymphedema presented to ED with acute onset diarrhea, nausea with vomiting, and diminished appetite. Patient is septic with no known cause and found to have AKI on admission and CK significantly elevated with rhabdomyolysis. Pharmacy consulted for electrolyte monitoring and replacement.   MIVF: + NS @125ml /hr  Goal of Therapy:  Electrolytes within normal limits  Plan:  - Mg 2 g IV x 1  - F/u with AM labs.   Eleonore Chiquito, PharmD, Clinical Pharmacist 02/16/2022 7:33 AM

## 2022-02-16 NOTE — Assessment & Plan Note (Signed)
Likely prerenal with nausea vomiting diarrhea and poor p.o. intake.  Resolved with IV hydration

## 2022-02-16 NOTE — Progress Notes (Signed)
Skin and incontinence care provided to patient. X7 assist. Multiple wounds noted to intertriginous folds, likely moisture-related. All wounds cleansed and dressings changed. Total assist. Tolerated well. Attempting to use bed features and pillows for pressure relief--limited by body habitus.

## 2022-02-16 NOTE — Assessment & Plan Note (Signed)
No clear source.  Possible urine/ skin Continue IV Rocephin and Zyvox.

## 2022-02-16 NOTE — Assessment & Plan Note (Signed)
CK : > 5000 -> 3448-> 1514.  Likely from laying in the bed.  Improving with hydration

## 2022-02-16 NOTE — Assessment & Plan Note (Signed)
He is bedbound at baseline.  He is requesting bariatric wheelchair/walker and hospital bed.  He is planning to go back home at discharge He has had bariatric surgery in 2013 with some initial weight loss but that regained PT, OT consult

## 2022-02-16 NOTE — Assessment & Plan Note (Signed)
Pharmacy actively managing this

## 2022-02-16 NOTE — Progress Notes (Signed)
Progress Note   Patient: Christopher Herman LFY:[id number] DOB: [birth year] DOA: 02/10/2022     5 DOS: the patient was seen and examined on 02/16/2022   Brief hospital course: 46 y.o. male with medical history significant for severe obesity with BMI of 99.85, obstructive sleep apnea, essential hypertension, GERD, cholelithiasis, bilateral lower extremity lipoma, status post IVC filter and chronic lymphedema, who presented to the ER with acute onset of diarrhea that occurs on Saturday as well as nausea without vomiting and significantly diminished appetite.  Admitted for suspected sepsis  2/15: Requesting hospital bed and bariatric walker.  Denies any further diarrhea.  Feeling very weak  2/16: Had 2-3 loose bowel movements 2/17: Low-grade fever, tachycardia.  ID added Zyvox 2/18: Getting electrolytes replaced, WBC worsening.,  Hypotensive and tachycardic.  He reports some loose bowel movement last night 2/19-persistent leukocytosis.  Electrolytes and CK much better   Assessment and Plan: * Sepsis ([name])- (present on admission) No clear source.  Possible urine/ skin Continue IV Rocephin and Zyvox.     Rhabdomyolysis- (present on admission) CK : > 5000 -> 3448-> 1514.  Likely from laying in the bed.  Improving with hydration  Electrolyte abnormality- (present on admission) Pharmacy actively managing this  AKI (acute kidney injury) ([name])- (present on admission) Likely prerenal with nausea vomiting diarrhea and poor p.o. intake.  Resolved with IV hydration  Obesity, morbid ([name])- (present on admission) He is bedbound at baseline.  He is requesting bariatric wheelchair/walker and hospital bed.  He is planning to go back home at discharge He has had bariatric surgery in 2013 with some initial weight loss but that regained PT, OT consult  Vitamin D deficiency- (present on admission) Chronic likely due to bariatric surgery        Subjective: Feeling much better.  No new  complaints  Physical Exam: Vitals:   02/15/22 2034 02/16/22 0436 02/16/22 0750 02/16/22 1114  BP: 120/72 (!) 107/58 110/73 113/65  Pulse: (!) 110 (!) 109 (!) 108 (!) 108  Resp: 18 20 18 20   Temp: 98.8 F (37.1 C) 98.3 F (36.8 C) 98.8 F (37.1 C) 98.4 F (36.9 C)  TempSrc: Oral Oral  Oral  SpO2: 97% 98% 97% 94%  Weight:      Height:       GENERAL:46 y.o.-year-oldmorbidly obese African-American malepatient lying in the bed with no acute distress.  EYES: Pupils equal, round, reactive to light and accommodation. No scleral icterus. Extraocular muscles intact.  HEENT: Head atraumatic, normocephalic. Oropharynx and nasopharynx clear.  NECK: Supple, no jugular venous distention. No thyroid enlargement, no tenderness.  LUNGS: Normal breath sounds bilaterally, no wheezing, rales,rhonchi or crepitation. No use of accessory muscles of respiration.  CARDIOVASCULAR: Regular rate and rhythm, S1, S2 normal. No murmurs, rubs, or gallops.  ABDOMEN: Soft, nondistended, nontender. Bowel sounds present. No organomegaly or mass.  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 He has bilateral medial thigh large lipomas shown below. NEUROLOGIC: Cranial nerves II through XII are intact. Muscle strength 5/5 in all extremities. Sensation intact.  PSYCHIATRIC: The patient is alert and oriented x 3. Normal affect and good eye contact.  Data Reviewed:  WBC 27.9, platelets 480  Family Communication: Mother was updated over phone yesterday  Disposition: Status is: Inpatient Remains inpatient appropriate because: Await ID input about his persistent leukocytosis and to decide antibiotic regimen for home.     DVT prophylaxis-Lovenox subcu       Planned Discharge Destination: Home  Time spent: 25 minutes  Author: Max Sane, MD 02/16/2022 11:35 AM  For on call review [email or web address].

## 2022-02-17 DIAGNOSIS — R197 Diarrhea, unspecified: Secondary | ICD-10-CM

## 2022-02-17 LAB — CBC
HCT: 27.1 % — ABNORMAL LOW (ref 39.0–52.0)
Hemoglobin: 8.5 g/dL — ABNORMAL LOW (ref 13.0–17.0)
MCH: 26.2 pg (ref 26.0–34.0)
MCHC: 31.4 g/dL (ref 30.0–36.0)
MCV: 83.4 fL (ref 80.0–100.0)
Platelets: 460 10*3/uL — ABNORMAL HIGH (ref 150–400)
RBC: 3.25 MIL/uL — ABNORMAL LOW (ref 4.22–5.81)
RDW: 15.4 % (ref 11.5–15.5)
WBC: 28.3 10*3/uL — ABNORMAL HIGH (ref 4.0–10.5)
nRBC: 0.1 % (ref 0.0–0.2)

## 2022-02-17 LAB — CK: Total CK: 966 U/L — ABNORMAL HIGH (ref 49–397)

## 2022-02-17 LAB — BASIC METABOLIC PANEL
Anion gap: 8 (ref 5–15)
BUN: 7 mg/dL (ref 6–20)
CO2: 29 mmol/L (ref 22–32)
Calcium: 5.2 mg/dL — CL (ref 8.9–10.3)
Chloride: 94 mmol/L — ABNORMAL LOW (ref 98–111)
Creatinine, Ser: 0.61 mg/dL (ref 0.61–1.24)
GFR, Estimated: >60 mL/min (ref >60)
Glucose, Bld: 98 mg/dL (ref 70–99)
Potassium: 3.4 mmol/L — ABNORMAL LOW (ref 3.5–5.1)
Sodium: 131 mmol/L — ABNORMAL LOW (ref 135–145)

## 2022-02-17 LAB — PHOSPHORUS: Phosphorus: 3 mg/dL (ref 2.5–4.6)

## 2022-02-17 LAB — VITAMIN B1: Vitamin B1 (Thiamine): 69.3 nmol/L (ref 66.5–200.0)

## 2022-02-17 LAB — MAGNESIUM: Magnesium: 1.8 mg/dL (ref 1.7–2.4)

## 2022-02-17 MED ORDER — COLLAGENASE 250 UNIT/GM EX OINT
TOPICAL_OINTMENT | Freq: Every day | CUTANEOUS | Status: DC
  Filled 2022-02-17: qty 30

## 2022-02-17 MED ORDER — ADULT MULTIVITAMIN W/MINERALS CH: 1.0000 | ORAL_TABLET | Freq: Every day | ORAL | 0 refills | Status: DC

## 2022-02-17 MED ORDER — POTASSIUM CHLORIDE 20 MEQ PO PACK
40.0000 meq | PACK | Freq: Two times a day (BID) | ORAL | Status: DC
  Administered 2022-02-17: 40 meq via ORAL
  Filled 2022-02-17: qty 2

## 2022-02-17 MED ORDER — MAGNESIUM SULFATE 2 GM/50ML IV SOLN
2.0000 g | Freq: Once | INTRAVENOUS | Status: AC
  Administered 2022-02-17: 2 g via INTRAVENOUS
  Filled 2022-02-17: qty 50

## 2022-02-17 MED ORDER — AMOXICILLIN-POT CLAVULANATE 875-125 MG PO TABS
1.0000 | ORAL_TABLET | Freq: Two times a day (BID) | ORAL | 0 refills | Status: AC
Start: 2022-02-17 — End: 2022-02-24

## 2022-02-17 MED ORDER — CALCIUM GLUCONATE-NACL 2-0.675 GM/100ML-% IV SOLN
2.0000 g | Freq: Once | INTRAVENOUS | Status: AC
  Administered 2022-02-17: 2000 mg via INTRAVENOUS
  Filled 2022-02-17: qty 100

## 2022-02-17 MED ORDER — AMOXICILLIN-POT CLAVULANATE 875-125 MG PO TABS: 1.0000 | ORAL_TABLET | Freq: Two times a day (BID) | ORAL | 0 refills | Status: DC

## 2022-02-17 NOTE — Consult Note (Signed)
PHARMACY CONSULT NOTE  Pharmacy Consult for Electrolyte Monitoring and Replacement   Recent Labs: Potassium (mmol/L)  Date Value  02/17/2022 3.4 (L)  02/11/2012 3.8   Magnesium (mg/dL)  Date Value  02/17/2022 1.8   Calcium (mg/dL)  Date Value  02/17/2022 5.2 (LL)   Calcium, Total (mg/dL)  Date Value  02/11/2012 8.7   Albumin (g/dL)  Date Value  02/13/2022 1.7 (L)  02/11/2012 3.3 (L)   Phosphorus (mg/dL)  Date Value  02/17/2022 3.0   Sodium (mmol/L)  Date Value  02/17/2022 131 (L)  02/11/2012 140   Assessment: 46 year old male with past medical history of severe obesity (BMI 99.85), obstructive sleep apnea, HTN, GERD, cholelithiasis, bilateral lower extremity lipoma, s/p IVC filter and chronic lymphedema presented to ED with acute onset diarrhea, nausea with vomiting, and diminished appetite. Patient is septic with no known cause and found to have AKI on admission and CK significantly elevated with rhabdomyolysis. Pharmacy consulted for electrolyte monitoring and replacement.   MIVF: + NS @125ml /hr  Goal of Therapy:  Electrolytes within normal limits  Plan:  -Magnesium borderline low at 1.8 after replacement for multiple days.Give another Mg 2 g IV x 1  --Potassium 3.8 > 3.4, with last replacement 2 days ago. Give Kcl 40 mEq x 2  Wynelle Cleveland, PharmD Pharmacy Resident  02/17/2022 7:34 AM

## 2022-02-17 NOTE — Plan of Care (Signed)

## 2022-02-17 NOTE — TOC Transition Note (Addendum)
Transition of Care [facility]) - CM/SW Discharge Note   Patient Details  Name: Christopher Herman MRN: [id number] Date of Birth: 08-13-1976  [facility] CM/SW Contact:  Alberteen Sam, LCSW Phone Number: 02/17/2022, 9:58 AM   Clinical Narrative:      CSW spoke with [facility] who reports they cannot accommodate transport. [facility] is able to and will arrive shortly. Treatment team updated.  Patient to discharge home via ems, referral for [facility] services given to [facility] with advanced, they are able to accept, patient has hospital bed that was delivered at home. No further dc needs at this time.  Final next level of care: [place] Barriers to Discharge: No Barriers Identified   Patient Goals and CMS Choice Patient states their goals for this hospitalization and ongoing recovery are:: to go home CMS Medicare.gov Compare Post Acute Care list provided to:: Patient Choice offered to / list presented to : Patient  Discharge Placement                Patient to be transferred to facility by: EMS   Patient and family notified of of transfer: 02/17/22  Discharge Plan and Services                DME Arranged: Hospital bed DME Agency: [employer] Date DME Agency Contacted: 02/12/22 Time DME Agency Contacted: [id number] Representative spoke with at DME Agency: [place]: PT, OT, RN, Nurse's Aide [place] Agency: [place] ([place]) Date [place]: 02/17/22 Time [place]: [phone] Representative spoke with at [place]: [place] ([place]) Interventions     Readmission Risk Interventions No flowsheet data found.

## 2022-02-17 NOTE — Progress Notes (Signed)
Pt discharged home..  D/C instructions given to pt.  All questions addressed.  PIV x2 taken out and site WNL.  Transported via EMS.

## 2022-02-17 NOTE — Progress Notes (Signed)
°   02/17/22 0005  Assess: MEWS Score  Temp 98.5 F (36.9 C)  BP 105/65  Pulse Rate (!) 112  ECG Heart Rate (!) 112  Resp 18  Level of Consciousness Alert  SpO2 97 %  O2 Device Room Air  Assess: MEWS Score  MEWS Temp 0  MEWS Systolic 0  MEWS Pulse 2  MEWS RR 0  MEWS LOC 0  MEWS Score 2  MEWS Score Color Yellow  Assess: if the MEWS score is Yellow or Red  Were vital signs taken at a resting state? Yes  Focused Assessment No change from prior assessment  Does the patient meet 2 or more of the SIRS criteria? No  Does the patient have a confirmed or suspected source of infection? Yes  Provider and Rapid Response Notified? Yes  Notify: Provider  Provider Name/Title NP Neomia Glass  Date Provider Notified 02/17/22  Time Provider Notified 0030  Notification Type Page (secure chat)  Notification Reason Other (Comment) (yellow mews)  Provider response No new orders  Date of Provider Response 02/17/22  Assess: SIRS CRITERIA  SIRS Temperature  0  SIRS Pulse 1  SIRS Respirations  0  SIRS WBC 0  SIRS Score Sum  1

## 2022-02-17 NOTE — Progress Notes (Signed)
° °  Date of Admission:  02/10/2022      ID: Christopher Herman is a 46 y.o. male Principal Problem:   Sepsis ([place]) Active Problems:   Vitamin D deficiency   Obesity, morbid ([place])   AKI (acute kidney injury) ([place])   Electrolyte abnormality   Rhabdomyolysis   Diarrhea   Medications:   bismuth subsalicylate  810 mg Oral TID AC & HS   calcium carbonate  1 tablet Oral TID WC   collagenase   Topical Daily   enoxaparin (LOVENOX) injection  0.5 mg/kg Subcutaneous Q24H   linezolid  600 mg Oral Q12H   multivitamin with minerals  1 tablet Oral BID   potassium chloride  40 mEq Oral BID   Ensure Max Protein  11 oz Oral QHS    Objective: Vital signs in last 24 hours: Temp:  [98 F (36.7 C)-98.7 F (37.1 C)] 98.4 F (36.9 C) (02/20 1137) Pulse Rate:  [87-112] 89 (02/20 1137) Resp:  [17-20] 18 (02/20 1137) BP: (105-118)/(55-81) 118/69 (02/20 1137) SpO2:  [94 %-100 %] 94 % (02/20 1137) Lab Results Recent Labs    02/16/22 0450 02/17/22 0437  WBC 27.9* 28.3*  HGB 9.0* 8.5*  HCT 28.2* 27.1*  NA 132* 131*  K 3.8 3.4*  CL 93* 94*  CO2 29 29  BUN 8 7  CREATININE 0.39* 0.61   Liver Panel No results for input(s): PROT, ALBUMIN, AST, ALT, ALKPHOS, BILITOT, BILIDIR, IBILI in the last 72 hours.  Microbiology: BC-NG UC-NG     Assessment/Plan: 79yr male presented with subjective fever, weakness, dizziness, diarrhea and sepsis like picture Severe leucocytosis, AKI, hyponatremia, anemia, hypoalbuminemia, hypokalemia, low vitamin D, low folate  Unclear source for sepsis Likely skin wounds- which are multiple from pressure and moisture but not stage IV Pt is on ceftriaxone and linezolid- will change to augmentin Unable to work him up for any intraabdominal pathology as CT could not be done Leucocytosis and thrombocytosis improved   Diarrhea  resolved- Cdiff/GI panel negative   AKI- resolved   Anemia Low folate Hypoalbuminemia' Increased LDH- no hemolysis Increase in CPK could  be from immobility  Gastric bypass Seen by nutritionist and ordered tests  Extreme obesity  PT/OT Pt was discharged today and h will follow up with his PCP

## 2022-02-17 NOTE — Consult Note (Addendum)
[facility] Nurse Consult Note: Reason for Consult: Consult requested for posterior thighs, back, buttocks.  Pt has a high BMI and it took 6 people to assist with turning.  He is on a bariatric mattress to reduce pressure.  Wounds are soiled related to incontinent stool which is loose and has drained all the way up his back and buttocks and thighs. Pt has multiple full thickness wounds to these locations; all are red moist and bleed slightly when cleansed; these are related to moisture associated skin damage.   Left posterior thigh with Unstageable pressure injury; 80% yellow slough, 20% red and moist, 6X6cm.   He has multiple skin folds to body which are difficult to assess related to size and they remain constantly moist and constant pressure from skin rubbing together. Affected areas to right back/buttocks/thighs are approx 30X20X.2cm  ICD-10 CM Codes for Irritant Dermatitis L24A2 - Due to fecal, urinary or dual incontinence LL30.4  - Erythema intertrigo;  genital/thigh intertrigo.  Pressure Injury POA: Yes  Dressing procedure/placement/frequency: Topical treatment orders provided for bedside nurses to perform as follows:  1. Apply Santyl to left posterior thigh Q day, then cover with moist gauze and foam dressing.  (Change foam dressing Q three days or PRN soiling.) 2. Foam dressings to other open areas on posterior thighs/back/buttock. Change Q 3 days or PRN soiling. Pt is preparing to discharge this morning.  Please re-consult if further assistance is needed.  Thank-you,  Julien Girt MSN, [place], [place], [place], [place]

## 2022-02-18 ENCOUNTER — Telehealth: Payer: Self-pay

## 2022-02-18 NOTE — Discharge Summary (Signed)
Physician Discharge Summary   Patient: Christopher Herman MRN: [id number] DOB: 07-26-1976  Admit date:     02/10/2022  Discharge date: 02/17/2022  Discharge Physician: Max Sane   PCP: Birdie Sons, MD   Recommendations at discharge:   Follow-up with outpatient providers as requested  Discharge Diagnoses: Principal Problem:   Sepsis ([place]) Active Problems:   Vitamin D deficiency   Obesity, morbid ([place])   AKI (acute kidney injury) ([place])   Electrolyte abnormality   Rhabdomyolysis   Diarrhea  Hospital Course: 46 y.o. male with medical history significant for severe obesity with BMI of 99.85, obstructive sleep apnea, essential hypertension, GERD, cholelithiasis, bilateral lower extremity lipoma, status post IVC filter and chronic lymphedema, who presented to the ER with acute onset of diarrhea that occurs on Saturday as well as nausea without vomiting and significantly diminished appetite.  Admitted for suspected sepsis  2/15: Requesting hospital bed and bariatric walker.  Denies any further diarrhea.  Feeling very weak  2/16: Had 2-3 loose bowel movements 2/17: Low-grade fever, tachycardia.  ID added Zyvox 2/18: Getting electrolytes replaced, WBC worsening.,  Hypotensive and tachycardic.  He reports some loose bowel movement last night 2/19-persistent leukocytosis.  Electrolytes and CK much better  Assessment and Plan: * Sepsis ([place])- (present on admission) Possible skin infection.  Treated with Rocephin and Zyvox and being discharged on oral Augmentin per ID recommendation he has remained afebrile.  No obvious symptoms suggestive of any source.  We could not get a CT further evaluation due to his body size.  Rhabdomyolysis- (present on admission) CK : > 5000 -> 3448-> 1514->966.  Likely from laying in the bed.  Improved with hydration  Electrolyte abnormality- (present on admission) Repleted  AKI (acute kidney injury) ([place])- (present on admission) Likely prerenal with nausea  vomiting diarrhea and poor p.o. intake.  Resolved with IV hydration  Obesity, morbid ([place])- (present on admission) He is bedbound at baseline.   He has had bariatric surgery in 2013 with some initial weight loss but that regained  Vitamin D deficiency- (present on admission) Chronic likely due to bariatric surgery   I've given him work excuse for the time he was in the hospital per patient request.  There is no work restriction.  Please note patient is bedbound at baseline and he works from home.       Consultants: Infectious disease Disposition: Home health PT, DME's arranged by TOC Diet recommendation:  Discharge Diet Orders (From admission, onward)     Start     Ordered   02/17/22 0000  Diet - low sodium heart healthy        02/17/22 0928           Carb modified diet  DISCHARGE MEDICATION: Allergies as of 02/17/2022   No Known Allergies      Medication List     STOP taking these medications    furosemide 40 MG tablet Commonly known as: LASIX   meloxicam 15 MG tablet Commonly known as: MOBIC   nizatidine 300 MG capsule Commonly known as: AXID   traMADol 50 MG tablet Commonly known as: ULTRAM       TAKE these medications    amoxicillin-clavulanate 875-125 MG tablet Commonly known as: Augmentin Take 1 tablet by mouth every 12 (twelve) hours for 7 days.   multivitamin with minerals Tabs tablet Take 1 tablet by mouth daily.        Follow-up Information     Fisher, Kirstie Peri, MD. Schedule an  appointment as soon as possible for a visit on 02/21/2022.   Specialty: Family Medicine Why: [facility] Discharge F/UP @ 10am Contact information: [place] [address] [phone]         Tsosie Billing, MD. Schedule an appointment as soon as possible for a visit in 1 week(s).   Specialty: Infectious Diseases Why: [facility] Discharge F/UP Contact information: [place]  [address] [phone]         Sindy Guadeloupe, MD. Schedule an appointment as soon as possible for a visit on 03/04/2022.   Specialty: Oncology Why: [facility] Discharge F/UP @ 9:30am Contact information: [place] [address] [phone]                 Discharge Exam: Danley Danker Weights   02/10/22 1856  Weight: (!) 272.2 kg   GENERAL:  46 y.o.-year-old morbidly obese African-American male patient lying in the bed with no acute distress.  EYES: Pupils equal, round, reactive to light and accommodation. No scleral icterus. Extraocular muscles intact.  HEENT: Head atraumatic, normocephalic. Oropharynx and nasopharynx clear.  NECK:  Supple, no jugular venous distention. No thyroid enlargement, no tenderness.  LUNGS: Normal breath sounds bilaterally, no wheezing, rales,rhonchi or crepitation. No use of accessory muscles of respiration.  CARDIOVASCULAR: Regular rate and rhythm, S1, S2 normal. No murmurs, rubs, or gallops.  ABDOMEN: Soft, nondistended, nontender. Bowel sounds present. No organomegaly or mass.  Skin (per [facility] nurse which I agree): Left posterior thigh with Unstageable pressure injury; 80% yellow slough, 20% red and moist, 6X6cm.  - Please note, he needs 6-7 person assist to turn and I've not examined this skin He has multiple skin folds to body which are difficult to assess related to size and they remain constantly moist and constant pressure from skin rubbing together. Affected areas to right back/buttocks/thighs are approx 30X20X.2cm NEUROLOGIC: Non-focal PSYCHIATRIC: The patient is alert and oriented x 3.  Normal affect and good eye contact.  Condition at discharge: fair  The results of significant diagnostics from this hospitalization (including imaging, microbiology, ancillary and laboratory) are listed below for reference.   Imaging Studies: US Abdomen Complete  Result Date: 02/11/2022 CLINICAL DATA:  Sepsis. EXAM: ABDOMEN ULTRASOUND  COMPLETE COMPARISON:  None. FINDINGS: Gallbladder: Gallstone. No gallbladder wall thickening or pericholecystic fluid. Negative sonographic Murphy's sign. Common bile duct: Diameter: 3 mm Liver: The liver is enlarged measuring 25 cm in craniocaudal length. Portal vein is patent on color Doppler imaging with normal direction of blood flow towards the liver. IVC: No abnormality visualized. Pancreas: Not visualized and obscured by bowel gas. Spleen: Size and appearance within normal limits. Right Kidney: Length: 12.5 cm. Normal echogenicity. No hydronephrosis or shadowing stone. Left Kidney: Length: 13.1 cm. Normal echogenicity. No hydronephrosis or shadowing stone. Abdominal aorta: No aneurysm visualized. Other findings: None. IMPRESSION: 1. Cholelithiasis without sonographic evidence of acute cholecystitis. 2. Hepatomegaly. Electronically Signed   By: Anner Crete M.D.   On: 02/11/2022 01:35   DG Chest Port 1 View  Result Date: 02/10/2022 CLINICAL DATA:  Questionable sepsis EXAM: PORTABLE CHEST 1 VIEW COMPARISON:  02/26/2012 FINDINGS: Low lung volumes. Heart and mediastinal contours are within normal limits. No focal opacities or effusions. No acute bony abnormality. AP lordotic nature of the study accentuates basilar markings. IMPRESSION: No active cardiopulmonary disease.  Low lung volumes. Electronically Signed   By: Rolm Baptise M.D.   On: 02/10/2022 23:51    Microbiology: Results  for orders placed or performed during the hospital encounter of 02/10/22  Resp Panel by RT-PCR (Flu A&B, Covid) Nasopharyngeal Swab     Status: None   Collection Time: 02/11/22 12:57 AM   Specimen: Nasopharyngeal Swab; Nasopharyngeal(NP) swabs in vial transport medium  Result Value Ref Range Status   SARS Coronavirus 2 by RT PCR NEGATIVE NEGATIVE Final    Comment: (NOTE) SARS-CoV-2 target nucleic acids are NOT DETECTED.  The SARS-CoV-2 RNA is generally detectable in upper respiratory specimens during the acute phase  of infection. The lowest concentration of SARS-CoV-2 viral copies this assay can detect is 138 copies/mL. A negative result does not preclude SARS-Cov-2 infection and should not be used as the sole basis for treatment or other patient management decisions. A negative result may occur with  improper specimen collection/handling, submission of specimen other than nasopharyngeal swab, presence of viral mutation(s) within the areas targeted by this assay, and inadequate number of viral copies(<138 copies/mL). A negative result must be combined with clinical observations, patient history, and epidemiological information. The expected result is Negative.  Fact Sheet for Patients:  EntrepreneurPulse.com.au  Fact Sheet for Healthcare Providers:  IncredibleEmployment.be  This test is no t yet approved or cleared by the [place] FDA and  has been authorized for detection and/or diagnosis of SARS-CoV-2 by FDA under an Emergency Use Authorization (EUA). This EUA will remain  in effect (meaning this test can be used) for the duration of the COVID-19 declaration under Section 564(b)(1) of the Act, 21 U.S.C.section 360bbb-3(b)(1), unless the authorization is terminated  or revoked sooner.       Influenza A by PCR NEGATIVE NEGATIVE Final   Influenza B by PCR NEGATIVE NEGATIVE Final    Comment: (NOTE) The Xpert Xpress SARS-CoV-2/FLU/RSV plus assay is intended as an aid in the diagnosis of influenza from Nasopharyngeal swab specimens and should not be used as a sole basis for treatment. Nasal washings and aspirates are unacceptable for Xpert Xpress SARS-CoV-2/FLU/RSV testing.  Fact Sheet for Patients: EntrepreneurPulse.com.au  Fact Sheet for Healthcare Providers: IncredibleEmployment.be  This test is not yet approved or cleared by the [place] FDA and has been authorized for detection and/or diagnosis of  SARS-CoV-2 by FDA under an Emergency Use Authorization (EUA). This EUA will remain in effect (meaning this test can be used) for the duration of the COVID-19 declaration under Section 564(b)(1) of the Act, 21 U.S.C. section 360bbb-3(b)(1), unless the authorization is terminated or revoked.  Performed at [facility], [address] [place], [place] [address]   MRSA Next Gen by PCR, Nasal     Status: None   Collection Time: 02/11/22  9:30 AM   Specimen: Nasal Mucosa; Nasal Swab  Result Value Ref Range Status   MRSA by PCR Next Gen NOT DETECTED NOT DETECTED Final    Comment: (NOTE) The GeneXpert MRSA Assay (FDA approved for NASAL specimens only), is one component of a comprehensive MRSA colonization surveillance program. It is not intended to diagnose MRSA infection nor to guide or monitor treatment for MRSA infections. Test performance is not FDA approved in patients less than 58 years old. Performed at [facility], [address] [place], [place] [address]   Urine Culture     Status: None   Collection Time: 02/11/22 10:17 AM   Specimen: In/Out Cath Urine  Result Value Ref Range Status   Specimen Description   Final    IN/OUT CATH URINE Performed at [facility], [place],  [place], [place] [address]    Special Requests   Final    NONE Performed at [facility], [address] [place], [place] [address]    Culture   Final    NO GROWTH Performed at [facility], [place] [address], [place], [place] [address]    Report Status 02/13/2022 FINAL  Final  Culture, blood (Routine X 2) w Reflex to ID Panel     Status: None   Collection Time: 02/11/22 10:33 AM   Specimen: BLOOD  Result Value Ref Range Status   Specimen Description BLOOD BLOOD LEFT HAND  Final   Special Requests   Final    BOTTLES DRAWN AEROBIC AND ANAEROBIC Blood Culture adequate volume   Culture   Final    NO GROWTH 5 DAYS Performed at [facility],  [address] [place], [place] [address]    Report Status 02/16/2022 FINAL  Final  Culture, blood (Routine X 2) w Reflex to ID Panel     Status: None   Collection Time: 02/11/22  3:25 PM   Specimen: BLOOD  Result Value Ref Range Status   Specimen Description BLOOD BLOOD LEFT HAND  Final   Special Requests BOTTLES DRAWN AEROBIC AND ANAEROBIC BCAV  Final   Culture   Final    NO GROWTH 5 DAYS Performed at [facility], [address] [place], [place] [address]    Report Status 02/16/2022 FINAL  Final  Gastrointestinal Panel by PCR , Stool     Status: None   Collection Time: 02/13/22  5:30 AM   Specimen: Stool  Result Value Ref Range Status   Campylobacter species NOT DETECTED NOT DETECTED Final   Plesimonas shigelloides NOT DETECTED NOT DETECTED Final   Salmonella species NOT DETECTED NOT DETECTED Final   Yersinia enterocolitica NOT DETECTED NOT DETECTED Final   Vibrio species NOT DETECTED NOT DETECTED Final   Vibrio cholerae NOT DETECTED NOT DETECTED Final   Enteroaggregative E coli (EAEC) NOT DETECTED NOT DETECTED Final   Enteropathogenic E coli (EPEC) NOT DETECTED NOT DETECTED Final   Enterotoxigenic E coli (ETEC) NOT DETECTED NOT DETECTED Final   Shiga like toxin producing E coli (STEC) NOT DETECTED NOT DETECTED Final   Shigella/Enteroinvasive E coli (EIEC) NOT DETECTED NOT DETECTED Final   Cryptosporidium NOT DETECTED NOT DETECTED Final   Cyclospora cayetanensis NOT DETECTED NOT DETECTED Final   Entamoeba histolytica NOT DETECTED NOT DETECTED Final   Giardia lamblia NOT DETECTED NOT DETECTED Final   Adenovirus F40/41 NOT DETECTED NOT DETECTED Final   Astrovirus NOT DETECTED NOT DETECTED Final   Norovirus GI/GII NOT DETECTED NOT DETECTED Final   Rotavirus A NOT DETECTED NOT DETECTED Final   Sapovirus (I, II, IV, and V) NOT DETECTED NOT DETECTED Final    Comment: Performed at [facility], [address] [place], [place] [address]  C Difficile Quick  Screen w PCR reflex     Status: None   Collection Time: 02/13/22  5:30 AM  Result Value Ref Range Status   C Diff antigen NEGATIVE NEGATIVE Final   C Diff toxin NEGATIVE NEGATIVE Final   C Diff interpretation No C. difficile detected.  Final    Comment: Performed at [facility], [address] [place], [place] [address]    Labs: CBC: Recent Labs  Lab 02/13/22 0013 02/13/22 0509 02/14/22 0500 02/15/22 0456 02/16/22 0450 02/17/22 0437  WBC 22.8* 23.8* 24.3* 27.4* 27.9* 28.3*  NEUTROABS 18.4*  --   --   --   --   --  HGB 9.8* 9.7* 9.3* 9.4* 9.0* 8.5*  HCT 29.2* 29.7* 28.4* 28.9* 28.2* 27.1*  MCV 79.6* 80.9 80.9 82.1 82.5 83.4  PLT 497* 469* 469* 492* 480* 081*   Basic Metabolic Panel: Recent Labs  Lab 02/14/22 0500 02/14/22 1353 02/15/22 0456 02/16/22 0450 02/17/22 0437  NA 134* 132* 131* 132* 131*  K 2.8* 3.2* 3.3* 3.8 3.4*  CL 95* 94* 92* 93* 94*  CO2 31 32 31 29 29   GLUCOSE 115* 138* 115* 97 98  BUN 6 6 7 8 7   CREATININE 0.48* 0.51* 0.53* 0.39* 0.61  CALCIUM 5.2* 5.2* 5.2* 5.2* 5.2*  MG 1.9 1.9 1.7 1.7 1.8  PHOS 2.0* 1.7* 2.3* 3.0 3.0   Liver Function Tests: Recent Labs  Lab 02/13/22 0509 02/13/22 1526  AST  --  48*  ALT  --  15  ALKPHOS  --  56  BILITOT  --  0.6  PROT  --  6.0*  ALBUMIN 1.6* 1.7*   CBG: No results for input(s): GLUCAP in the last 168 hours.  Discharge time spent: greater than 30 minutes.  Signed: Max Sane, MD [facility] 02/18/2022

## 2022-02-18 NOTE — Telephone Encounter (Signed)
Copied from [place] [phone]. Topic: General - Inquiry >> Feb 18, 2022  4:31 PM Greggory Keen D wrote: Reason for CRM: Pt called regarding the yearly eval of his FMLA papers.  He states he just got out of the hospital yesterday.  CB#  [phone]

## 2022-02-19 ENCOUNTER — Telehealth: Payer: Self-pay | Admitting: Family Medicine

## 2022-02-19 LAB — COMP PANEL: LEUKEMIA/LYMPHOMA

## 2022-02-19 NOTE — Telephone Encounter (Signed)
Tiffany with [facility] states the pt told the nurse he was working.  They are not able to see him in the home if he is working outside the home. They have tried several times to call him and his mother to verify thiss information, no one will return the calls   Cb [phone] option 2

## 2022-02-19 NOTE — Telephone Encounter (Signed)
Patients last office visit was 03/30/2020. Do you have FMLA paper work for patient? He has hospital follow up appointment scheduled for Friday 02/21/2022 at 10am.

## 2022-02-20 ENCOUNTER — Encounter: Payer: Self-pay | Admitting: Family Medicine

## 2022-02-20 ENCOUNTER — Ambulatory Visit (INDEPENDENT_AMBULATORY_CARE_PROVIDER_SITE_OTHER): Payer: BC Managed Care – PPO | Admitting: Family Medicine

## 2022-02-20 ENCOUNTER — Ambulatory Visit: Payer: Self-pay

## 2022-02-20 ENCOUNTER — Other Ambulatory Visit: Payer: Self-pay

## 2022-02-20 DIAGNOSIS — M62838 Other muscle spasm: Secondary | ICD-10-CM

## 2022-02-20 DIAGNOSIS — E43 Unspecified severe protein-calorie malnutrition: Secondary | ICD-10-CM | POA: Diagnosis not present

## 2022-02-20 MED ORDER — CYCLOBENZAPRINE HCL 5 MG PO TABS: 5.0000 mg | ORAL_TABLET | Freq: Three times a day (TID) | ORAL | 0 refills | Status: DC | PRN

## 2022-02-20 MED ORDER — MULTI-VITAMIN/MINERALS PO TABS: 1.0000 | ORAL_TABLET | Freq: Every day | ORAL | 3 refills | Status: DC

## 2022-02-20 NOTE — Progress Notes (Signed)
Argentina Ponder [place],acting as a scribe for Gwyneth Sprout, FNP.,have documented all relevant documentation on the behalf of Gwyneth Sprout, FNP,as directed by  Gwyneth Sprout, FNP while in the presence of Gwyneth Sprout, FNP.   MyChart Video Visit    Virtual Visit via Video Note   This visit type was conducted due to national recommendations for restrictions regarding the COVID-19 Pandemic (e.g. social distancing) in an effort to limit this patient's exposure and mitigate transmission in our community. This patient is at least at moderate risk for complications without adequate follow up. This format is felt to be most appropriate for this patient at this time. Physical exam was limited by quality of the video and audio technology used for the visit.   Patient location: home Provider location: [facility] office  I discussed the limitations of evaluation and management by telemedicine and the availability of in person appointments. The patient expressed understanding and agreed to proceed.  Patient: Christopher Herman   DOB: 10-18-1976   46 y.o. Male  MRN: [id number] Visit Date: 02/20/2022  Today's healthcare provider: Gwyneth Sprout, FNP   Morbidly obese patient presents today from his bed where he reports most of his time is spent, with complaints of leg cramps and muscle spasms.  Patient also requests refill for multivitamin as he is unsure which medication to pick up.  Subjective    HPI  Patient is a 46 year old male who presents via video visit.  He has complaints of bilateral lower extremity muscle spasms.  Patient states they occur at various times of the day.  He describes them as the muscle cramping up and getting very hard and painful.  Patient has only tried taking Ibuprofen.  He said they started last week.   It is of note that the patient was hospitalized from 02/10/22 to 02/17/22 for sepsis.  His other discharge diagnosis included Vitamin D deficiency, obesity-morbid, AKI, electrolyte  abnormality, Rhabdomyolysis and diarrhea.   When asked he has good fluid intake.   Patient reports that he has not been able to walk since getting sick and is still not able to walk.   He is still on antibiotics.  Medications: Outpatient Medications Prior to Visit  Medication Sig   amoxicillin-clavulanate (AUGMENTIN) 875-125 MG tablet Take 1 tablet by mouth every 12 (twelve) hours for 7 days.   [DISCONTINUED] Multiple Vitamin (MULTIVITAMIN WITH MINERALS) TABS tablet Take 1 tablet by mouth daily. (Patient not taking: Reported on 02/20/2022)   No facility-administered medications prior to visit.    Review of Systems  Respiratory:  Negative for cough and shortness of breath.   Cardiovascular:  Positive for leg swelling. Negative for chest pain and palpitations.  Musculoskeletal:  Positive for myalgias.     Objective    There were no vitals taken for this visit.   Physical Exam Vitals and nursing note reviewed.  Constitutional:      General: He is not in acute distress.    Appearance: He is obese. He is not ill-appearing, toxic-appearing or diaphoretic.  Pulmonary:     Effort: Pulmonary effort is normal.  Neurological:     Mental Status: He is alert and oriented to person, place, and time.  Psychiatric:        Mood and Affect: Mood normal.       Assessment & Plan     Problem List Items Addressed This Visit       Other   Edema  Patient complains of chronic bilateral lower extremity edema Medication review shows previous use of diuretics We will hold off on use of diuretics at this time with plan clearly electrolyte levels Continue use of Flexeril to augment complaints of muscle spasms Recommend well-balanced diet and use of water Recommend avoidance of NSAIDs including ibuprofen for cramping given history of rhabdomyolysis      Muscle spasm of both lower legs - Primary    Patient recently seen in patient with sepsis and rhabdomyolysis at local hospital Is back  home today with complaints of bilateral lower extremity muscle spasms Patient endorses high amounts of a oral intake including water Last known weight greater than 600 pounds Recommend use of Flexeril going forward; patient denies that he drives at this time and medication will not cause any interference due to common side effect of drowsiness Encouraged to let us know if medication is not sufficient and if symptoms remain      Relevant Medications   cyclobenzaprine (FLEXERIL) 5 MG tablet   Severe protein-calorie malnutrition ([place])    Patient likely has severe protein calorie malnutrition given last known weight greater than 600 pounds with complaints of recent hospitalization with known rhabdomyolysis and sepsis was told to start on multivitamin to assist nutritional status Patient did not pick up multivitamin as he was unsure which multivitamin to use Prescription provided today to assist      Relevant Medications   Multiple Vitamins-Minerals (MULTIVITAMIN WITH MINERALS) tablet     Return if symptoms worsen or fail to improve.     I discussed the assessment and treatment plan with the patient. The patient was provided an opportunity to ask questions and all were answered. The patient agreed with the plan and demonstrated an understanding of the instructions.   The patient was advised to call back or seek an in-person evaluation if the symptoms worsen or if the condition fails to improve as anticipated.  I provided 8 minutes of non-face-to-face time during this encounter.  Vonna Kotyk, FNP, have reviewed all documentation for this visit. The documentation on 02/20/22 for the exam, diagnosis, procedures, and orders are all accurate and complete.   Gwyneth Sprout, [place] [phone] (phone) [phone] (fax)  [place]

## 2022-02-20 NOTE — Assessment & Plan Note (Signed)
Patient complains of chronic bilateral lower extremity edema Medication review shows previous use of diuretics We will hold off on use of diuretics at this time with plan clearly electrolyte levels Continue use of Flexeril to augment complaints of muscle spasms Recommend well-balanced diet and use of water Recommend avoidance of NSAIDs including ibuprofen for cramping given history of rhabdomyolysis

## 2022-02-20 NOTE — Telephone Encounter (Signed)
°  Chief Complaint: severe muscle spasms Symptoms: spasms from hip to feet, constant Frequency: ongoing since before DC from hospital Pertinent Negatives: NA Disposition: [] ED /[] Urgent Care (no appt availability in office) / [x] Appointment(In office/virtual)/ []  [facility]/ [] Home Care/ [] Refused Recommended Disposition /[] [facility]/ []  Follow-up with PCP Additional Notes: called and spoke with [place], CMA who was able to provide pt with VV for today with Daneil Dan, NP at 1400. Advised pt of this appt.    Reason for Disposition  [1] SEVERE pain (e.g., excruciating, unable to do any normal activities) AND [2] not improved after 2 hours of pain medicine  Answer Assessment - Initial Assessment Questions 1. ONSET: "When did the pain start?"      Last week in hospital 2. LOCATION: "Where is the pain located?"      Both legs hip down to feet 3. PAIN: "How bad is the pain?"    (Scale 1-10; or mild, moderate, severe)   -  MILD (1-3): doesn't interfere with normal activities    -  MODERATE (4-7): interferes with normal activities (e.g., work or school) or awakens from sleep, limping    -  SEVERE (8-10): excruciating pain, unable to do any normal activities, unable to walk     8 6. OTHER SYMPTOMS: "Do you have any other symptoms?" (e.g., chest pain, back pain, breathing difficulty, swelling, rash, fever, numbness, weakness)     no  Protocols used: Leg Pain-A-AH

## 2022-02-20 NOTE — Assessment & Plan Note (Signed)
Patient recently seen in patient with sepsis and rhabdomyolysis at local hospital Is back home today with complaints of bilateral lower extremity muscle spasms Patient endorses high amounts of a oral intake including water Last known weight greater than 600 pounds Recommend use of Flexeril going forward; patient denies that he drives at this time and medication will not cause any interference due to common side effect of drowsiness Encouraged to let us know if medication is not sufficient and if symptoms remain

## 2022-02-20 NOTE — Assessment & Plan Note (Signed)
Patient likely has severe protein calorie malnutrition given last known weight greater than 600 pounds with complaints of recent hospitalization with known rhabdomyolysis and sepsis was told to start on multivitamin to assist nutritional status Patient did not pick up multivitamin as he was unsure which multivitamin to use Prescription provided today to assist

## 2022-02-21 ENCOUNTER — Encounter: Payer: Self-pay | Admitting: Family Medicine

## 2022-02-21 ENCOUNTER — Other Ambulatory Visit: Payer: Self-pay

## 2022-02-21 ENCOUNTER — Telehealth: Payer: Self-pay

## 2022-02-21 ENCOUNTER — Ambulatory Visit (INDEPENDENT_AMBULATORY_CARE_PROVIDER_SITE_OTHER): Payer: BC Managed Care – PPO | Admitting: Family Medicine

## 2022-02-21 VITALS — Ht 65.0 in | Wt >= 6400 oz

## 2022-02-21 DIAGNOSIS — D1720 Benign lipomatous neoplasm of skin and subcutaneous tissue of unspecified limb: Secondary | ICD-10-CM

## 2022-02-21 DIAGNOSIS — E559 Vitamin D deficiency, unspecified: Secondary | ICD-10-CM

## 2022-02-21 DIAGNOSIS — E43 Unspecified severe protein-calorie malnutrition: Secondary | ICD-10-CM

## 2022-02-21 DIAGNOSIS — M6282 Rhabdomyolysis: Secondary | ICD-10-CM | POA: Diagnosis not present

## 2022-02-21 DIAGNOSIS — D509 Iron deficiency anemia, unspecified: Secondary | ICD-10-CM

## 2022-02-21 DIAGNOSIS — R197 Diarrhea, unspecified: Secondary | ICD-10-CM | POA: Diagnosis not present

## 2022-02-21 LAB — VITAMIN K1, SERUM: VITAMIN K1: <0.1 ng/mL — ABNORMAL LOW (ref 0.10–2.20)

## 2022-02-21 MED ORDER — SILVER SULFADIAZINE 1 % EX CREA: 1.0000 "application " | TOPICAL_CREAM | Freq: Every day | CUTANEOUS | 0 refills | Status: DC

## 2022-02-21 MED ORDER — WEGOVY 0.25 MG/0.5ML ~~LOC~~ SOAJ: 0.2500 mg | SUBCUTANEOUS | 2 refills | Status: DC

## 2022-02-21 MED ORDER — VITAMIN D3 50 MCG (2000 UT) PO CAPS: 2000.0000 [IU] | ORAL_CAPSULE | Freq: Every day | ORAL | 3 refills | Status: DC

## 2022-02-21 MED ORDER — ONE-A-DAY MENS PO TABS: 1.0000 | ORAL_TABLET | Freq: Every day | ORAL | 3 refills | Status: DC

## 2022-02-21 NOTE — Progress Notes (Signed)
MyChart Video Visit    Virtual Visit via Video Note   This visit type was conducted due to national recommendations for restrictions regarding the COVID-19 Pandemic (e.g. social distancing) in an effort to limit this patient's exposure and mitigate transmission in our community. This patient is at least at moderate risk for complications without adequate follow up. This format is felt to be most appropriate for this patient at this time. Physical exam was limited by quality of the video and audio technology used for the visit.   Patient location: home Provider location: [facility]  I discussed the limitations of evaluation and management by telemedicine and the availability of in person appointments. The patient expressed understanding and agreed to proceed.  Patient: Christopher Herman   DOB: 03-21-1976   46 y.o. Male  MRN: [id number] Visit Date: 02/21/2022  Today's healthcare provider: Lelon Huh, MD   Chief Complaint  Patient presents with   Follow-up    hospital   Subjective    HPI HPI     Follow-up    Additional comments: hospital        Comments   FMLA      Last edited by Royal Hawthorn, CMA on 02/21/2022  8:34 AM.      Admitted [facility] 2/14 through 2/20 for diarrhea nausea and vomiting with AKI and sepsis. Noted to be moderately anemia and had hematology work up by Dr. Janese Banks and advised to recheck H&H in a few months as outpatient.   He is back to his baseline now but needs FMLA forms to recertify for chronic LED edema and massive lipomas which occasionally preclude him from being able to get to and from work.    Medications: Outpatient Medications Prior to Visit  Medication Sig   [EXPIRED] amoxicillin-clavulanate (AUGMENTIN) 875-125 MG tablet Take 1 tablet by mouth every 12 (twelve) hours for 7 days.   cyclobenzaprine (FLEXERIL) 5 MG tablet Take 1 tablet (5 mg total) by mouth 3 (three) times daily as needed for muscle spasms.   Multiple Vitamins-Minerals  (MULTIVITAMIN WITH MINERALS) tablet Take 1 tablet by mouth daily.   No facility-administered medications prior to visit.    Review of Systems     Objective    Ht 5\' 5"  (1.651 m) Comment: per patient  Wt (!) 600 lb (272.2 kg) Comment: per chart  BMI 99.85 kg/m   Awake, alert, oriented x 3. In no apparent distress    Assessment & Plan     1. Diarrhea of presumed infectious origin  2. Non-traumatic rhabdomyolysis  3. Severe protein-calorie malnutrition ([place])  Required several days of inpatient supportive care, but is now mostly back to baseline. Advised he will need to return in a few months for follow anemia labs.   4. Vitamin D deficiency  - Cholecalciferol (VITAMIN D3) 50 MCG (2000 UT) capsule; Take 1 capsule (2,000 Units total) by mouth daily.  Dispense: 100 capsule; Refill: 3  5. Iron deficiency anemia, unspecified iron deficiency anemia type   6. Lipoma of lower extremity, unspecified laterality Completely FMLA forms regarding inability to travel to and from work periodically due to recurrent swelling and edema of Les related to chronic massive lipomas.   7. Obesity, morbid ([place]) Failed previous weight loss medications. Recommend he try samples emaglutide-Weight Management (WEGOVY) 0.25 MG/0.5ML SOAJ; Inject 0.25 mg into the skin once a week. Increase to 0.5 mg once a week starting with 5th d    I discussed the assessment and treatment plan with  the patient. The patient was provided an opportunity to ask questions and all were answered. The patient agreed with the plan and demonstrated an understanding of the instructions.   The patient was advised to call back or seek an in-person evaluation if the symptoms worsen or if the condition fails to improve as anticipated.  I provided 13 minutes of non-face-to-face time during this encounter.  The entirety of the information documented in the History of Present Illness, Review of Systems and Physical Exam were personally  obtained by me. Portions of this information were initially documented by the CMA and reviewed by me for thoroughness and accuracy.    Lelon Huh, MD [facility] [phone] (phone) [phone] (fax)  [place]

## 2022-02-21 NOTE — Telephone Encounter (Signed)
We've got samples of Wegovey. He can pick up sample if he likes. If he does well with it we can prescription assistance from the drug company or more samples.

## 2022-02-21 NOTE — Telephone Encounter (Signed)
I called patient and advised him that we need his FMLA forms dropped off so that Dr. Caryn Section can complete them. Patient verbalized understanding and agreed to have someone bring the forms to our office.   Patient wanted me to let Dr. Caryn Section know that the Mancel Parsons is going to cost $1600 per month and his insurance does not cover the medication.

## 2022-02-23 LAB — BCR-ABL1, CML/ALL, PCR, QUANT: Interpretation (BCRAL):: NEGATIVE

## 2022-02-24 ENCOUNTER — Encounter: Payer: Self-pay | Admitting: *Deleted

## 2022-02-24 NOTE — Telephone Encounter (Signed)
Patient has been advised he states that he will pick up sample today and request for help with assistance for prescription if tolerating well. KW

## 2022-02-28 ENCOUNTER — Other Ambulatory Visit: Payer: Self-pay | Admitting: Family Medicine

## 2022-02-28 LAB — VITAMIN A: Vitamin A (Retinoic Acid): <2.5 ug/dL — ABNORMAL LOW (ref 20.1–62.0)

## 2022-02-28 LAB — VITAMIN E
Vitamin E (Alpha Tocopherol): 6.3 mg/L — ABNORMAL LOW (ref 7.0–25.1)
Vitamin E(Gamma Tocopherol): 1 mg/L (ref 0.5–5.5)

## 2022-02-28 NOTE — Telephone Encounter (Signed)
Requested medication (s) are due for refill today: no, duplicate request. ? ?Requested medication (s) are on the active medication list: yes ? ?Last refill:  02/21/22 50g with 0 RF ? ?Future visit scheduled: no, seen 02/21/22 ? ?Notes to clinic:  This med was already filled 0/94/70, duplicate request. There is not a protocol to follow for this med, please assess. ? ? ?  ? ?Requested Prescriptions  ?Pending Prescriptions Disp Refills  ? silver sulfADIAZINE (SILVADENE) 1 % cream [Pharmacy Med Name: SILVER SULFADIAZINE 1% CREAM 25GM] 50 g 0  ?  Sig: APPLY TOPICALLY TO THE AFFECTED AREA DAILY  ?  ? Off-Protocol Failed - 02/28/2022  4:47 PM  ?  ?  Failed - Medication not assigned to a protocol, review manually.  ?  ?  Passed - Valid encounter within last 12 months  ?  Recent Outpatient Visits   ? ?      ? 1 week ago Obesity, morbid ([place])  ? [facility] Birdie Sons, MD  ? 1 week ago Muscle spasm of both lower legs  ? [facility] Tally Joe T, FNP  ? 1 year ago Pain of left lower extremity  ? [facility] Birdie Sons, MD  ? 2 years ago Obesity, morbid [facility])  ? [facility] Birdie Sons, MD  ? 3 years ago Edema, unspecified type  ? [facility] Caryn Section, Kirstie Peri, MD  ? ?  ?  ? ?  ?  ?  ? ? ?

## 2022-02-28 NOTE — Telephone Encounter (Signed)
Pt made an additional call to check on the status of this refill, please advise.  ?

## 2022-03-03 LAB — CALR + MPL (REFLEXED)

## 2022-03-03 LAB — JAK2 W/REFLEX TO CALR/MPL

## 2022-03-04 ENCOUNTER — Inpatient Hospital Stay: Payer: BC Managed Care – PPO | Attending: Internal Medicine | Admitting: Oncology

## 2022-03-04 ENCOUNTER — Other Ambulatory Visit: Payer: Self-pay

## 2022-03-04 ENCOUNTER — Inpatient Hospital Stay: Payer: BC Managed Care – PPO | Admitting: Internal Medicine

## 2022-03-04 ENCOUNTER — Encounter: Payer: Self-pay | Admitting: Oncology

## 2022-03-04 DIAGNOSIS — D729 Disorder of white blood cells, unspecified: Secondary | ICD-10-CM

## 2022-03-04 DIAGNOSIS — D72829 Elevated white blood cell count, unspecified: Secondary | ICD-10-CM | POA: Diagnosis not present

## 2022-03-04 NOTE — Progress Notes (Signed)
I connected with Christopher Herman on 03/04/22 at  9:30 AM EST by video enabled telemedicine visit and verified that I am speaking with the correct person using two identifiers.   I discussed the limitations, risks, security and privacy concerns of performing an evaluation and management service by telemedicine and the availability of in-person appointments. I also discussed with the patient that there may be a patient responsible charge related to this service. The patient expressed understanding and agreed to proceed.  Other persons participating in the visit and their role in the encounter:  none  Patient's location:  home Provider's location:  work  Reason for referral: Leukocytosis Referring provider: Dr. Max Sane  History of present illness: Patient is a 46 year old African-American male with a past medical historySignificant for gastric bypass surgery in 2013 who presented with symptoms of sepsis and rhabdomyolysis in February 2023 to the hospital.  At that time he was noted to have significant leukocytosis with a white count of 42.3 which reduced to 28.3 upon discharge.  Platelet count went down from 728 on admission to 460 on discharge.  His discharge hemoglobin was 8.5.  As a part of the work-up for leukocytosis he had JAK2, CALR, MPL mutation testing done which was negative.  BCR ABL testing negative.  Flow cytometry showed neutropenia and monocytosis with phenotype with aberrancy and left-sided maturation.  This could be seen with myeloproliferative processes.  Patient weighs 600 pounds and states that his legs have gotten weaker over time and essentially he has been bedbound since August 2022.  Reports having bilateral lower extremity wounds.  He recently had a virtual visit with Dr. Caryn Section who is his primary care doctor as well.     Review of Systems  Constitutional:  Positive for malaise/fatigue. Negative for chills, fever and weight loss.  HENT:  Negative for congestion, ear  discharge and nosebleeds.   Eyes:  Negative for blurred vision.  Respiratory:  Negative for cough, hemoptysis, sputum production, shortness of breath and wheezing.   Cardiovascular:  Negative for chest pain, palpitations, orthopnea and claudication.  Gastrointestinal:  Negative for abdominal pain, blood in stool, constipation, diarrhea, heartburn, melena, nausea and vomiting.  Genitourinary:  Negative for dysuria, flank pain, frequency, hematuria and urgency.  Musculoskeletal:  Negative for back pain, joint pain and myalgias.       Bilateral lower extremity wounds and weakness  Skin:  Negative for rash.  Neurological:  Negative for dizziness, tingling, focal weakness, seizures, weakness and headaches.  Endo/Heme/Allergies:  Does not bruise/bleed easily.  Psychiatric/Behavioral:  Negative for depression and suicidal ideas. The patient does not have insomnia.    No Known Allergies  Past Medical History:  Diagnosis Date   AKI (acute kidney injury) ([place])    Cholelithiasis    Chronic acquired lymphedema    GERD (gastroesophageal reflux disease)    Helicobacter pylori infection 09/10/2015   History of sepsis    Leg muscle spasm    Leukocytosis    Lipoma of both lower extremities    Palpitations 05/31/2009   Chronic, stable   Presence of IVC filter    Rhabdomyolysis    Sleep apnea    Tachycardia    Vitamin D deficiency     Past Surgical History:  Procedure Laterality Date   GASTRIC BYPASS  05/23/2012    Social History   Socioeconomic History   Marital status: Single    Spouse name: Not on file   Number of children: 0   Years of education: Not  on file   Highest education level: Not on file  Occupational History   Occupation: Employed    Comment: Works as a Systems developer  Tobacco Use   Smoking status: Former   Smokeless tobacco: Never   Tobacco comments:    Quit smoking in 2010, smoked cigarettes, smoked for about 16 years; smoked less than 1/2 pack per day.   Substance and Sexual Activity   Alcohol use: No   Drug use: No   Sexual activity: Not Currently    Birth control/protection: None  Other Topics Concern   Not on file  Social History Narrative   Not on file   Social Determinants of Health   Financial Resource Strain: Not on file  Food Insecurity: Not on file  Transportation Needs: Not on file  Physical Activity: Not on file  Stress: Not on file  Social Connections: Not on file  Intimate Partner Violence: Not on file    Family History  Problem Relation Age of Onset   Cancer Paternal Grandmother    Cancer Paternal Grandfather      Current Outpatient Medications:    Cholecalciferol (VITAMIN D3) 50 MCG (2000 UT) capsule, Take 1 capsule (2,000 Units total) by mouth daily., Disp: 100 capsule, Rfl: 3   multivitamin (ONE-A-DAY MEN'S) TABS tablet, Take 1 tablet by mouth daily., Disp: 100 tablet, Rfl: 3   Semaglutide-Weight Management (WEGOVY) 0.25 MG/0.5ML SOAJ, Inject 0.25 mg into the skin once a week. Increase to 0.5 mg once a week starting with 5th dose, Disp: 2 mL, Rfl: 2   silver sulfADIAZINE (SILVADENE) 1 % cream, APPLY TOPICALLY TO THE AFFECTED AREA DAILY, Disp: 50 g, Rfl: 2   cyclobenzaprine (FLEXERIL) 5 MG tablet, Take 1 tablet (5 mg total) by mouth 3 (three) times daily as needed for muscle spasms. (Patient not taking: Reported on 03/04/2022), Disp: 90 tablet, Rfl: 0   Multiple Vitamins-Minerals (MULTIVITAMIN WITH MINERALS) tablet, Take 1 tablet by mouth daily. (Patient not taking: Reported on 03/04/2022), Disp: 90 tablet, Rfl: 3  US Abdomen Complete  Result Date: 02/11/2022 CLINICAL DATA:  Sepsis. EXAM: ABDOMEN ULTRASOUND COMPLETE COMPARISON:  None. FINDINGS: Gallbladder: Gallstone. No gallbladder wall thickening or pericholecystic fluid. Negative sonographic Murphy's sign. Common bile duct: Diameter: 3 mm Liver: The liver is enlarged measuring 25 cm in craniocaudal length. Portal vein is patent on color Doppler imaging with  normal direction of blood flow towards the liver. IVC: No abnormality visualized. Pancreas: Not visualized and obscured by bowel gas. Spleen: Size and appearance within normal limits. Right Kidney: Length: 12.5 cm. Normal echogenicity. No hydronephrosis or shadowing stone. Left Kidney: Length: 13.1 cm. Normal echogenicity. No hydronephrosis or shadowing stone. Abdominal aorta: No aneurysm visualized. Other findings: None. IMPRESSION: 1. Cholelithiasis without sonographic evidence of acute cholecystitis. 2. Hepatomegaly. Electronically Signed   By: Anner Crete M.D.   On: 02/11/2022 01:35   DG Chest Port 1 View  Result Date: 02/10/2022 CLINICAL DATA:  Questionable sepsis EXAM: PORTABLE CHEST 1 VIEW COMPARISON:  02/26/2012 FINDINGS: Low lung volumes. Heart and mediastinal contours are within normal limits. No focal opacities or effusions. No acute bony abnormality. AP lordotic nature of the study accentuates basilar markings. IMPRESSION: No active cardiopulmonary disease.  Low lung volumes. Electronically Signed   By: Rolm Baptise M.D.   On: 02/10/2022 23:51    No images are attached to the encounter.   CMP Latest Ref Rng & Units 02/17/2022  Glucose 70 - 99 mg/dL 98  BUN 6 - 20 mg/dL 7  Creatinine 0.61 - 1.24 mg/dL 0.61  Sodium 135 - 145 mmol/L 131(L)  Potassium 3.5 - 5.1 mmol/L 3.4(L)  Chloride 98 - 111 mmol/L 94(L)  CO2 22 - 32 mmol/L 29  Calcium 8.9 - 10.3 mg/dL 5.2(LL)  Total Protein 6.5 - 8.1 g/dL -  Total Bilirubin 0.3 - 1.2 mg/dL -  Alkaline Phos 38 - 126 U/L -  AST 15 - 41 U/L -  ALT 0 - 44 U/L -   CBC Latest Ref Rng & Units 02/17/2022  WBC 4.0 - 10.5 K/uL 28.3(H)  Hemoglobin 13.0 - 17.0 g/dL 8.5(L)  Hematocrit 39.0 - 52.0 % 27.1(L)  Platelets 150 - 400 K/uL 460(H)     Observation/objective: Appears in no acute distress over video visit today.  Breathing is nonlabored  Assessment and plan: Patient is a 46 year old male referred for leukocytosis  Patient noted to have a  white cell count of 42 and he was admitted to the hospital for sepsis.  Differential mainly showed neutrophilia and monocytosis which gradually drifted down to 28 but did not normalize upon discharge.  It is unclear if his leukocytosis is entirely reactive.  Patient is morbidly obese and bedbound and unable to come to clinic for in person visit or blood draws.  Ideally I would like to watch his CBC closely with a repeat count in 3 months but we are unable to get this arranged at his house without home health facilities.  Patient states that he cannot afford to pay for home health.  I have reached out to Dr. Caryn Section and he will try his best to get a CBC arranged in 3 months.  Even if his white cell count consistently goes up ideally he would need a bone marrow biopsy to rule out an underlying myeloproliferative disorder but with his weight of 600 pounds he would not be a candidate for a bone marrow biopsy which cannot be logistically arranged in the outpatient setting.  Follow-up instructions: No follow-up required with me at this time and patient will continue to follow-up with Dr. Caryn Section and hopefully we will get a CBC done in 3 months through Dr. Caryn Section for our review  I discussed the assessment and treatment plan with the patient. The patient was provided an opportunity to ask questions and all were answered. The patient agreed with the plan and demonstrated an understanding of the instructions.   The patient was advised to call back or seek an in-person evaluation if the symptoms worsen or if the condition fails to improve as anticipated.    Visit Diagnosis: 1. Neutrophilia     Dr. Randa Evens, MD, MPH [facility] at [facility] Tel- [phone] 03/04/2022 1:04 PM

## 2022-03-04 NOTE — Progress Notes (Signed)
Patient has no concerns at the moment. 

## 2022-03-07 NOTE — Telephone Encounter (Signed)
Pt is calling to follow up on FMLA forms. Pt stated he is getting ready to lose his job if this isn?t filled out. ? ?Pt requesting a call back if possible today. ?

## 2022-03-07 NOTE — Telephone Encounter (Signed)
I don't see that the FMLA forms have been scanned into his chart. Do you still have his FMLA forms? Please advise.  ?

## 2022-03-12 NOTE — Telephone Encounter (Signed)
Patient checking on the status of FMLA paperwork, per patient forms were dropped of on the 1st or 2nd of March and would like a follow up call.  ?

## 2022-03-13 NOTE — Telephone Encounter (Signed)
No I don't have the FMLA form anymore.  I filled that out after his phone visit on the 24th and sent it up to medical records.  ?

## 2022-03-15 DIAGNOSIS — A419 Sepsis, unspecified organism: Secondary | ICD-10-CM | POA: Diagnosis not present

## 2022-03-17 NOTE — Telephone Encounter (Signed)
I don't have it. He can either drop off another or I can fill out a blank government FMLA form that the department of labor publishes.  ?

## 2022-03-24 ENCOUNTER — Ambulatory Visit: Payer: Self-pay | Admitting: *Deleted

## 2022-03-24 DIAGNOSIS — R609 Edema, unspecified: Secondary | ICD-10-CM

## 2022-03-24 MED ORDER — FUROSEMIDE 40 MG PO TABS: 40.0000 mg | ORAL_TABLET | Freq: Every day | ORAL | 3 refills | Status: DC | PRN

## 2022-03-24 NOTE — Telephone Encounter (Signed)
Call returned.   C/o swelling in his legs.   Requesting a Pure Wick Catheter. ? ? ?Reason for Disposition ? [1] MODERATE leg swelling (e.g., swelling extends up to knees) AND [2] new-onset or worsening ?   Bed bound.   Unable to come in. ? ?Answer Assessment - Initial Assessment Questions ?1. ONSET: "When did the swelling start?" (e.g., minutes, hours, days) ?    Both legs are swollen.   I think it's from the IV fluids from the hospital.  I never got rid of the extra fluid.   I retain fluid anyway.    I have lipo derma on my legs.  I'm having a hard time keeping my legs on the bed as a result of them being so swollen with fluid and the lipo derma.   ?I'm want a condom catheter.   (Pt requested a Pure Wick Catheter.   I clarified which catheter he is requesting.   He does not want the kind that goes into him.   I asked if the condom catheter and explained it to him and that is what he is requesting.   A condom catheter not a Pure Wick Catheter).    ?I was on fluid pills but it's hard for me to go to the bathroom so I am not taking them.   My prescription has expired.   If can't have the catheter I don't want the fluid pills because it's too hard for me to get up to the bathroom.    ? ?I'm bed bound.  I can't come in.   Would you send Dr. Caryn Section my message.  I let him know I would and that someone would be calling him back.   He was agreeable to this plan. ? ? ?2. LOCATION: "What part of the leg is swollen?"  "Are both legs swollen or just one leg?" ?    Both legs ?3. SEVERITY: "How bad is the swelling?" (e.g., localized; mild, moderate, severe) ? - Localized - small area of swelling localized to one leg ? - MILD pedal edema - swelling limited to foot and ankle, pitting edema < 1/4 inch (6 mm) deep, rest and elevation eliminate most or all swelling ? - MODERATE edema - swelling of lower leg to knee, pitting edema > 1/4 inch (6 mm) deep, rest and elevation only partially reduce swelling ? - SEVERE edema - swelling  extends above knee, facial or hand swelling present  ?    I have trouble with retaining fluid but I never got rid of the extra fluid from the IV fluids they gave me in th hospital. ?4. REDNESS: "Does the swelling look red or infected?" ?    Not asked ?5. PAIN: "Is the swelling painful to touch?" If Yes, ask: "How painful is it?"   (Scale 1-10; mild, moderate or severe) ?    Not asked ?6. FEVER: "Do you have a fever?" If Yes, ask: "What is it, how was it measured, and when did it start?"  ?    N/A ?7. CAUSE: "What do you think is causing the leg swelling?" ?    IV fluids from the hospitalization.    Not taking my fluid pills because too difficult to go to the bathroom. ?8. MEDICAL HISTORY: "Do you have a history of heart failure, kidney disease, liver failure, or cancer?" ?    Not asked ?9. RECURRENT SYMPTOM: "Have you had leg swelling before?" If Yes, ask: "When was the last time?" "What  happened that time?" ?    Yes ?10. OTHER SYMPTOMS: "Do you have any other symptoms?" (e.g., chest pain, difficulty breathing) ?      Not sked ?11. PREGNANCY: "Is there any chance you are pregnant?" "When was your last menstrual period?" ?      N/A ? ?Protocols used: Leg Swelling and Edema-A-AH ? ?

## 2022-03-24 NOTE — Telephone Encounter (Signed)
Have sent order for furosemide. OK to order condom catheter.  ?

## 2022-03-24 NOTE — Telephone Encounter (Signed)
?  Chief Complaint: Bilateral leg swelling and requesting a condom catheter if approval is given to restart his fluid pills.  Fluid pill rx has expired ?Symptoms: Bilateral swollen legs.   Difficulty going to the bathroom due to being bed bound. ?Frequency: Legs swollen since being in the hospital. ?Pertinent Negatives: Patient denies wanting an indwelling urinary catheter but requesting the condom catheter. ?Disposition: '[]'$ ED /'[]'$ Urgent Care (no appt availability in office) / '[]'$ Appointment(In office/virtual)/ '[]'$  [place] [facility]/ '[]'$ Home Care/ '[]'$ Refused Recommended Disposition /'[]'$ [place] [facility]/ '[x]'$  Follow-up with PCP ?Additional Notes: Note sent to Dr. Caryn Section with his requests.  ?

## 2022-03-24 NOTE — Addendum Note (Signed)
Addended by: Birdie Sons on: 03/24/2022 06:11 PM ? ? Modules accepted: Orders ? ?

## 2022-03-25 ENCOUNTER — Other Ambulatory Visit: Payer: Self-pay

## 2022-03-25 NOTE — Telephone Encounter (Signed)
Spoke with patient and advised him that he can get the catheters at [facility] ?

## 2022-03-30 ENCOUNTER — Other Ambulatory Visit: Payer: Self-pay | Admitting: Family Medicine

## 2022-04-01 ENCOUNTER — Telehealth: Payer: Self-pay | Admitting: Family Medicine

## 2022-04-01 NOTE — Telephone Encounter (Signed)
Calling checking on the status if PCP received from his employer [employer] his FMLA paperwork that was faxed on 03/27/2022, forms were faxed to [phone] and caller will have employer fax again today.  Please note if received.  ?

## 2022-04-02 ENCOUNTER — Telehealth: Payer: Self-pay | Admitting: Family Medicine

## 2022-04-02 NOTE — Telephone Encounter (Signed)
They were completed and sent to medical records.  ?

## 2022-04-03 ENCOUNTER — Ambulatory Visit: Payer: Self-pay

## 2022-04-03 NOTE — Telephone Encounter (Signed)
Per Elmyra Ricks, forms were faxed yesterday and copy is up. Pt was informed. ?

## 2022-04-03 NOTE — Telephone Encounter (Signed)
Pt's mother called, left VM to return the call to the office to discuss symptoms with a nurse. ? ?Summary: advice - wound / infection  ? Pts mother called in regarding the pts wound, caller thinks it may still be infected and was wanting something called in, caller needed advice.   ?  ? ?

## 2022-04-03 NOTE — Telephone Encounter (Signed)
?  Chief Complaint: wound infection  ?Symptoms: wounds on back of both legs, drainage clear or milky, itching ?Frequency: since February ?Pertinent Negatives: Patient denies pain ?Disposition: '[]'$ ED /'[]'$ Urgent Care (no appt availability in office) / '[x]'$ Appointment(In office/virtual)/ '[]'$  [place] [facility]/ '[]'$ Home Care/ '[]'$ Refused Recommended Disposition /'[]'$ [place] [facility]/ '[]'$  Follow-up with PCP ?Additional Notes: pt has been using silvadene cream and is bed bound but feels like infection has never went away and now has so much swelling and drainage. Scheduled pt VV with Dr Caryn Section 04/04/22 @ 1620. ? ?Reason for Disposition ? [1] Pus or cloudy fluid draining from wound AND [2] no fever ? ?Answer Assessment - Initial Assessment Questions ?1. LOCATION: "Where is the wound located?"  ?    Back of legs  ?2. WOUND APPEARANCE: "What does the wound look like?"  ?    open ?3. SIZE: If redness is present, ask: "What is the size of the red area?" (Inches, centimeters, or compare to size of a coin)  ?    Dime sized  ?5. ONSET: "When did it start to look infected?"  ?    Back in February  ?7. PAIN: "Is there any pain?" If Yes, ask: "How bad is the pain?"   (Scale 1-10; or mild, moderate, severe) ?    Not painful but itchy ?9. OTHER SYMPTOMS: "Do you have any other symptoms?" (e.g., shaking chills, weakness, rash elsewhere on body) ?    Drainage, clear or milky, odor at times ? ?Protocols used: Wound Infection-A-AH ? ?

## 2022-04-04 ENCOUNTER — Encounter: Payer: Self-pay | Admitting: Family Medicine

## 2022-04-04 ENCOUNTER — Telehealth (INDEPENDENT_AMBULATORY_CARE_PROVIDER_SITE_OTHER): Payer: BC Managed Care – PPO | Admitting: Family Medicine

## 2022-04-04 DIAGNOSIS — S81801D Unspecified open wound, right lower leg, subsequent encounter: Secondary | ICD-10-CM

## 2022-04-04 DIAGNOSIS — S81802D Unspecified open wound, left lower leg, subsequent encounter: Secondary | ICD-10-CM | POA: Diagnosis not present

## 2022-04-04 DIAGNOSIS — L03119 Cellulitis of unspecified part of limb: Secondary | ICD-10-CM | POA: Diagnosis not present

## 2022-04-04 MED ORDER — DOXYCYCLINE HYCLATE 100 MG PO TABS: 100.0000 mg | ORAL_TABLET | Freq: Two times a day (BID) | ORAL | 0 refills | Status: DC

## 2022-04-04 NOTE — Progress Notes (Signed)
? ? ?MyChart Video Visit ? ? ? ?Virtual Visit via Video Note  ? ?This visit type was conducted due to national recommendations for restrictions regarding the COVID-19 Pandemic (e.g. social distancing) in an effort to limit this patient's exposure and mitigate transmission in our community. This patient is at least at moderate risk for complications without adequate follow up. This format is felt to be most appropriate for this patient at this time. Physical exam was limited by quality of the video and audio technology used for the visit.  ? ?Patient location: home ?Provider location: [facility] ? ?I discussed the limitations of evaluation and management by telemedicine and the availability of in person appointments. The patient expressed understanding and agreed to proceed. ? ?Patient: Christopher Herman   DOB: 10/31/1976   46 y.o. Male  MRN: [id number] ?Visit Date: 04/04/2022 ? ?Today's healthcare provider: Lelon Huh, MD  ? ?Chief Complaint  ?Patient presents with  ? Wound Check  ? ?Subjective  ?  ?HPI  ?Wound: ?Patient complains of having wounds on the back of both legs. He states his wounds are itchy and have a clear- milky color drainage. Patient is bed bound and has been using silvadene cream. He also reports swelling around the wound and legs.  ? ?Medications: ?Outpatient Medications Prior to Visit  ?Medication Sig  ? Cholecalciferol (VITAMIN D3) 50 MCG (2000 UT) capsule Take 1 capsule (2,000 Units total) by mouth daily.  ? cyclobenzaprine (FLEXERIL) 5 MG tablet Take 1 tablet (5 mg total) by mouth 3 (three) times daily as needed for muscle spasms.  ? furosemide (LASIX) 40 MG tablet Take 1 tablet (40 mg total) by mouth daily as needed. Take one tablet daily  ? Multiple Vitamins-Minerals (MULTIVITAMIN WITH MINERALS) tablet Take 1 tablet by mouth daily.  ? multivitamin (ONE-A-DAY MEN'S) TABS tablet Take 1 tablet by mouth daily.  ? Semaglutide-Weight Management (WEGOVY) 0.25 MG/0.5ML SOAJ Inject 0.25 mg into the skin  once a week. Increase to 0.5 mg once a week starting with 5th dose  ? silver sulfADIAZINE (SILVADENE) 1 % cream APPLY TOPICALLY TO THE AFFECTED AREA DAILY  ? ?No facility-administered medications prior to visit.  ? ? ?Review of Systems  ?Constitutional:  Negative for appetite change, chills and fever.  ?Respiratory:  Negative for chest tightness, shortness of breath and wheezing.   ?Cardiovascular:  Positive for leg swelling. Negative for chest pain and palpitations.  ?Gastrointestinal:  Negative for abdominal pain, nausea and vomiting.  ?Skin:  Positive for wound.  ? ? ? ? Objective  ?  ?There were no vitals taken for this visit. ? ? ? ? ?Physical Exam  ? ?Posterior lower leg ? ? Assessment & Plan  ?  ? ?1. Cellulitis of lower extremity, unspecified laterality ? ?- doxycycline (VIBRA-TABS) 100 MG tablet; Take 1 tablet (100 mg total) by mouth 2 (two) times daily for 21 days.  Dispense: 42 tablet; Refill: 0 ? ? ?2. Wound of right lower extremity, subsequent encounter ?- Ambulatory referral to Home Health ? ?3. Wound of left lower extremity, subsequent encounter ? - Ambulatory referral to Home Health ?  ? ?I discussed the assessment and treatment plan with the patient. The patient was provided an opportunity to ask questions and all were answered. The patient agreed with the plan and demonstrated an understanding of the instructions. ?  ?The patient was advised to call back or seek an in-person evaluation if the symptoms worsen or if the condition fails to improve as anticipated. ? ?  I provided 9 minutes of non-face-to-face time during this encounter. ? ?The entirety of the information documented in the History of Present Illness, Review of Systems and Physical Exam were personally obtained by me. Portions of this information were initially documented by the CMA and reviewed by me for thoroughness and accuracy.  ? ? ?Lelon Huh, MD ?[facility] ?[phone] (phone) ?[phone] (fax) ? ?[facility]   ?

## 2022-04-15 ENCOUNTER — Telehealth: Payer: Self-pay

## 2022-04-15 DIAGNOSIS — A419 Sepsis, unspecified organism: Secondary | ICD-10-CM | POA: Diagnosis not present

## 2022-04-15 NOTE — Telephone Encounter (Signed)
Copied from [place] [id number]. Topic: General - Other ?>> Apr 15, 2022 10:42 AM Parke Poisson wrote: ?Reason for CRM: Pt refused home health care due to cost.He states at present time there is no puss or bleeding coming from wound ?

## 2022-05-02 ENCOUNTER — Other Ambulatory Visit: Payer: Self-pay | Admitting: Family Medicine

## 2022-05-02 DIAGNOSIS — L03119 Cellulitis of unspecified part of limb: Secondary | ICD-10-CM

## 2022-06-11 ENCOUNTER — Other Ambulatory Visit: Payer: Self-pay | Admitting: Family Medicine

## 2022-06-11 DIAGNOSIS — L03119 Cellulitis of unspecified part of limb: Secondary | ICD-10-CM

## 2022-06-11 NOTE — Telephone Encounter (Signed)
Requested medications are due for refill today.  Unsure  Requested medications are on the active medications list.  yes  Last refill. Doxycycline 05/02/2022 #42 0 rf, Silvadene 03/30/2022 50g 4 refills  Future visit scheduled.   no  Notes to clinic.  Medications are not delegated for refills.    Requested Prescriptions  Pending Prescriptions Disp Refills   doxycycline (VIBRA-TABS) 100 MG tablet [Pharmacy Med Name: DOXYCYCLINE HYC '100MG'$  TABS] 42 tablet 0    Sig: TAKE 1 TABLET(100 MG) BY MOUTH TWICE DAILY FOR 21 DAYS     Off-Protocol Failed - 06/11/2022  9:48 AM      Failed - Medication not assigned to a protocol, review manually.      Passed - Valid encounter within last 12 months    Recent Outpatient Visits           2 months ago Cellulitis of lower extremity, unspecified laterality   [facility] Birdie Sons, MD   3 months ago Diarrhea of presumed infectious origin   [facility] Birdie Sons, MD   3 months ago Muscle spasm of both lower legs   [facility] Tally Joe T, FNP   2 years ago Pain of left lower extremity   [facility] Birdie Sons, MD   2 years ago Obesity, morbid [facility])   [facility] Birdie Sons, MD               silver sulfADIAZINE (SILVADENE) 1 % cream [Pharmacy Med Name: SILVER SULFADIAZINE 1% CREAM 25GM] 50 g 4    Sig: APPLY TOPICALLY TO THE AFFECTED AREA DAILY     Off-Protocol Failed - 06/11/2022  9:48 AM      Failed - Medication not assigned to a protocol, review manually.      Passed - Valid encounter within last 12 months    Recent Outpatient Visits           2 months ago Cellulitis of lower extremity, unspecified laterality   [facility] Birdie Sons, MD   3 months ago Diarrhea of presumed infectious origin   [facility] Birdie Sons, MD   3 months ago Muscle spasm of both lower legs   [facility]  Tally Joe T, FNP   2 years ago Pain of left lower extremity   [facility] Birdie Sons, MD   2 years ago Obesity, morbid [facility])   [facility] Birdie Sons, MD

## 2022-07-10 ENCOUNTER — Ambulatory Visit: Payer: Self-pay

## 2022-07-10 NOTE — Telephone Encounter (Signed)
Pt calls saying they have had diarrhea for several weeks and has taken two boxes imodium and I offered to make her an appt and they said they could not come in bc they can't walk.  They just wanted his nurse to call them back.     Chief Complaint: One watery stool daily x several weeks Symptoms: Abdominal cramping Frequency: Several weeks Pertinent Negatives: Patient denies fever Disposition: '[]'$ ED /'[]'$ Urgent Care (no appt availability in office) / '[x]'$ Appointment(In office/virtual)/ '[]'$  [facility]/ '[]'$ Home Care/ '[]'$ Refused Recommended Disposition /'[]'$ [facility]/ '[]'$  Follow-up with PCP Additional Notes:   Reason for Disposition  Abdominal pain  (Exception: Pain clears with each passage of diarrhea stool)  Answer Assessment - Initial Assessment Questions 1. DIARRHEA SEVERITY: "How bad is the diarrhea?" "How many more stools have you had in the past 24 hours than normal?"    - NO DIARRHEA (SCALE 0)   - MILD (SCALE 1-3): Few loose or mushy BMs; increase of 1-3 stools over normal daily number of stools; mild increase in ostomy output.   -  MODERATE (SCALE 4-7): Increase of 4-6 stools daily over normal; moderate increase in ostomy output. * SEVERE (SCALE 8-10; OR 'WORST POSSIBLE'): Increase of 7 or more stools daily over normal; moderate increase in ostomy output; incontinence.     1 2. ONSET: "When did the diarrhea begin?"      Several weeks 3. BM CONSISTENCY: "How loose or watery is the diarrhea?"      Watery 4. VOMITING: "Are you also vomiting?" If Yes, ask: "How many times in the past 24 hours?"      No 5. ABDOMINAL PAIN: "Are you having any abdominal pain?" If Yes, ask: "What does it feel like?" (e.g., crampy, dull, intermittent, constant)      Yes - cramps 6. ABDOMINAL PAIN SEVERITY: If present, ask: "How bad is the pain?"  (e.g., Scale 1-10; mild, moderate, or severe)   - MILD (1-3): doesn't interfere with normal activities, abdomen soft and not tender to touch    -  MODERATE (4-7): interferes with normal activities or awakens from sleep, abdomen tender to touch    - SEVERE (8-10): excruciating pain, doubled over, unable to do any normal activities       6 7. ORAL INTAKE: If vomiting, "Have you been able to drink liquids?" "How much liquids have you had in the past 24 hours?"     Yes 8. HYDRATION: "Any signs of dehydration?" (e.g., dry mouth [not just dry lips], too weak to stand, dizziness, new weight loss) "When did you last urinate?"     Yes 9. EXPOSURE: "Have you traveled to a foreign country recently?" "Have you been exposed to anyone with diarrhea?" "Could you have eaten any food that was spoiled?"     No 10. ANTIBIOTIC USE: "Are you taking antibiotics now or have you taken antibiotics in the past 2 months?"       Doxycycline 11. OTHER SYMPTOMS: "Do you have any other symptoms?" (e.g., fever, blood in stool)       No 12. PREGNANCY: "Is there any chance you are pregnant?" "When was your last menstrual period?"       N/a  Protocols used: [facility]

## 2022-07-10 NOTE — Progress Notes (Signed)
I,Sulibeya S Dimas,acting as a Education administrator for Gwyneth Sprout, FNP.,have documented all relevant documentation on the behalf of Gwyneth Sprout, FNP,as directed by  Gwyneth Sprout, FNP while in the presence of Gwyneth Sprout, FNP.  MyChart Video Visit    Virtual Visit via Video Note   This visit type was conducted due to national recommendations for restrictions regarding the COVID-19 Pandemic (e.g. social distancing) in an effort to limit this patient's exposure and mitigate transmission in our community. This patient is at least at moderate risk for complications without adequate follow up. This format is felt to be most appropriate for this patient at this time. Physical exam was limited by quality of the video and audio technology used for the visit.   Patient location: home, in bed Provider location: [facility] [address]  [address] [place], [place] [address]   I discussed the limitations of evaluation and management by telemedicine and the availability of in person appointments. The patient expressed understanding and agreed to proceed.  Patient: Christopher Herman   DOB: 22-Jun-1976   46 y.o. Male  MRN: [id number] Visit Date: 07/11/2022  Today's healthcare provider: Gwyneth Sprout, FNP   Chief Complaint  Patient presents with   Diarrhea   Subjective    Diarrhea  This is a recurrent problem. The current episode started more than 1 month ago. The problem has been gradually improving. The stool consistency is described as Watery. The patient states that diarrhea awakens him from sleep. Associated symptoms include abdominal pain, bloating and increased flatus. Pertinent negatives include no chills, coughing, fever or vomiting. Exacerbated by: eating and drinking. There are no known risk factors. Treatments tried: Imodium, pepto bismol. The treatment provided no relief.    Medications: Outpatient Medications Prior to Visit  Medication Sig   Cholecalciferol (VITAMIN D3)  50 MCG (2000 UT) capsule Take 1 capsule (2,000 Units total) by mouth daily.   cyclobenzaprine (FLEXERIL) 5 MG tablet Take 1 tablet (5 mg total) by mouth 3 (three) times daily as needed for muscle spasms.   furosemide (LASIX) 40 MG tablet Take 1 tablet (40 mg total) by mouth daily as needed. Take one tablet daily   Multiple Vitamins-Minerals (MULTIVITAMIN WITH MINERALS) tablet Take 1 tablet by mouth daily.   silver sulfADIAZINE (SILVADENE) 1 % cream APPLY TOPICALLY TO THE AFFECTED AREA DAILY   doxycycline (VIBRA-TABS) 100 MG tablet TAKE 1 TABLET(100 MG) BY MOUTH TWICE DAILY FOR 21 DAYS   Semaglutide-Weight Management (WEGOVY) 0.25 MG/0.5ML SOAJ Inject 0.25 mg into the skin once a week. Increase to 0.5 mg once a week starting with 5th dose   [DISCONTINUED] multivitamin (ONE-A-DAY MEN'S) TABS tablet Take 1 tablet by mouth daily.   No facility-administered medications prior to visit.    Review of Systems  Constitutional:  Negative for chills and fever.  Respiratory:  Negative for cough.   Gastrointestinal:  Positive for abdominal pain, bloating, diarrhea and flatus. Negative for nausea and vomiting.       Objective    There were no vitals taken for this visit.    Physical Exam Constitutional:      Appearance: He is obese. He is not diaphoretic.  Pulmonary:     Effort: Pulmonary effort is normal.  Abdominal:     Comments: Reports peri-umbilical pain d/t gas   Neurological:     General: No focal deficit present.     Mental Status: He is alert.  Psychiatric:  Mood and Affect: Mood normal.        Behavior: Behavior normal.        Thought Content: Thought content normal.        Judgment: Judgment normal.        Assessment & Plan     Problem List Items Addressed This Visit       Digestive   Chronic diarrhea of unknown origin - Primary    Acute on chronic, reports soft/sometimes water BMs every 4 hours noted without regard to food/drink Has previous has similar  complaints and tried OTC medications; and thus repeated that POC prior to this visit Patient tried both pepto and imodium without relief Denies travel Denies sick contact Denies new foods/medications Recommend use of Abx with anti-gas medication RTC/OK for virtual given patient appears home bound d/t BMI if not improved Emergent care instructions discussed with patient including fever, loss of urine production, inability to eat/drink      Relevant Medications   fidaxomicin (DIFICID) 200 MG TABS tablet   simethicone (GAS-X) 80 MG chewable tablet     Return if symptoms worsen or fail to improve.     I discussed the assessment and treatment plan with the patient. The patient was provided an opportunity to ask questions and all were answered. The patient agreed with the plan and demonstrated an understanding of the instructions.   The patient was advised to call back or seek an in-person evaluation if the symptoms worsen or if the condition fails to improve as anticipated.  I provided 10 minutes of face-to-face time during this encounter discussing current symptoms, previous OTC treatments and POC.  Vonna Kotyk, FNP, have reviewed all documentation for this visit. The documentation on 07/11/22 for the exam, diagnosis, procedures, and orders are all accurate and complete.   Gwyneth Sprout, [place] [phone] (phone) [phone] (fax)  [place]

## 2022-07-11 ENCOUNTER — Telehealth (INDEPENDENT_AMBULATORY_CARE_PROVIDER_SITE_OTHER): Payer: BC Managed Care – PPO | Admitting: Family Medicine

## 2022-07-11 ENCOUNTER — Other Ambulatory Visit: Payer: Self-pay | Admitting: Family Medicine

## 2022-07-11 ENCOUNTER — Encounter: Payer: Self-pay | Admitting: Family Medicine

## 2022-07-11 DIAGNOSIS — K529 Noninfective gastroenteritis and colitis, unspecified: Secondary | ICD-10-CM | POA: Insufficient documentation

## 2022-07-11 MED ORDER — METRONIDAZOLE 500 MG PO TABS: 500.0000 mg | ORAL_TABLET | Freq: Three times a day (TID) | ORAL | 0 refills | Status: AC

## 2022-07-11 MED ORDER — SIMETHICONE 80 MG PO CHEW: 80.0000 mg | CHEWABLE_TABLET | Freq: Four times a day (QID) | ORAL | 0 refills | Status: DC

## 2022-07-11 MED ORDER — VANCOMYCIN HCL 125 MG PO CAPS: 125.0000 mg | ORAL_CAPSULE | Freq: Four times a day (QID) | ORAL | 0 refills | Status: AC

## 2022-07-11 MED ORDER — FIDAXOMICIN 200 MG PO TABS
200.0000 mg | ORAL_TABLET | Freq: Two times a day (BID) | ORAL | 0 refills | Status: DC
Start: 2022-07-11 — End: 2022-07-11

## 2022-07-11 NOTE — Assessment & Plan Note (Signed)
Acute on chronic, reports soft/sometimes water BMs every 4 hours noted without regard to food/drink Has previous has similar complaints and tried OTC medications; and thus repeated that POC prior to this visit Patient tried both pepto and imodium without relief Denies travel Denies sick contact Denies new foods/medications Recommend use of Abx with anti-gas medication RTC/OK for virtual given patient appears home bound d/t BMI if not improved Emergent care instructions discussed with patient including fever, loss of urine production, inability to eat/drink

## 2022-07-15 NOTE — Telephone Encounter (Signed)
Pt. States he is no better. Concerned he has irritable bowel syndrome. States he has started the antibiotic Friday. States "maybe I need to give it more time." Stools are still watery and loose." Please advise pt.

## 2022-07-15 NOTE — Telephone Encounter (Signed)
Spoke with patient, he stated his understanding.

## 2022-07-31 ENCOUNTER — Ambulatory Visit: Payer: Self-pay | Admitting: *Deleted

## 2022-07-31 NOTE — Telephone Encounter (Signed)
Patient advised as below.  

## 2022-07-31 NOTE — Telephone Encounter (Signed)
Chief Complaint: chronic diarrhea Symptoms: back like before abx Frequency: 4-5 x a day Pertinent Negatives: Patient denies fever, blood in stool Disposition: '[]'$ ED /'[]'$ Urgent Care (no appt availability in office) / '[]'$ Appointment(In office/virtual)/ '[]'$  [place] [facility]/ '[]'$ Home Care/ '[]'$ Refused Recommended Disposition /'[]'$ [facility]/ '[x]'$  Follow-up with PCP Additional Notes: Pt was seen 07/11/22 for same, he told me he took Vancomycin, felt some better but finished it and now back to same as before Vanc. Pt states he went to pharm and they did not have vanc so they sent it to a different [facility]. Pt said he did not know there were two rx. I verified with pharm that he did not pick up Flagyl. Pt has finished Vanc but never started Flagyl, needs to know what to do from here. Doesn't know if should take Flagyl now or not. Please advise him.  Reason for Disposition  Diarrhea is a chronic symptom (recurrent or ongoing AND present > 4 weeks)  Answer Assessment - Initial Assessment Questions 1. DIARRHEA SEVERITY: "How bad is the diarrhea?" "How many more stools have you had in the past 24 hours than normal?"    - NO DIARRHEA (SCALE 0)   - MILD (SCALE 1-3): Few loose or mushy BMs; increase of 1-3 stools over normal daily number of stools; mild increase in ostomy output.   -  MODERATE (SCALE 4-7): Increase of 4-6 stools daily over normal; moderate increase in ostomy output.   -  SEVERE (SCALE 8-10; OR "WORST POSSIBLE"): Increase of 7 or more stools daily over normal; moderate increase in ostomy output; incontinence.     Goes 3 times a day, starts as mush then turns to liquid 2. ONSET: "When did the diarrhea begin?"      Chronic diarrhea 3. BM CONSISTENCY: "How loose or watery is the diarrhea?"      watery 4. VOMITING: "Are you also vomiting?" If Yes, ask: "How many times in the past 24 hours?"      Occasionally but not since Tuesday 5. ABDOMEN PAIN: "Are you having any abdomen pain?"  If Yes, ask: "What does it feel like?" (e.g., crampy, dull, intermittent, constant)      No, has cramping until passes gas or has bm 6. ABDOMEN PAIN SEVERITY: If present, ask: "How bad is the pain?"  (e.g., Scale 1-10; mild, moderate, or severe)   - MILD (1-3): doesn't interfere with normal activities, abdomen soft and not tender to touch    - MODERATE (4-7): interferes with normal activities or awakens from sleep, abdomen tender to touch    - SEVERE (8-10): excruciating pain, doubled over, unable to do any normal activities       8 7. ORAL INTAKE: If vomiting, "Have you been able to drink liquids?" "How much liquids have you had in the past 24 hours?"     Not vomiting so drinking a lot of water 8. HYDRATION: "Any signs of dehydration?" (e.g., dry mouth [not just dry lips], too weak to stand, dizziness, new weight loss) "When did you last urinate?"    Urinating multiple times a day about 5 times a day 9. EXPOSURE: "Have you traveled to a foreign country recently?" "Have you been exposed to anyone with diarrhea?" "Could you have eaten any food that was spoiled?"     No, chronic 10. ANTIBIOTIC USE: "Are you taking antibiotics now or have you taken antibiotics in the past 2 months?"       Finished a couple of days ago 11.  OTHER SYMPTOMS: "Do you have any other symptoms?" (e.g., fever, blood in stool)       Not in stool 12. PREGNANCY: "Is there any chance you are pregnant?" "When was your last menstrual period?"       no  Protocols used: [facility]

## 2022-07-31 NOTE — Telephone Encounter (Signed)
See triage call.   Pt states he went to pharm and they did not have vanc so [facility]. Pt said he did not know there were two rx. I verified with pharm that he did not pick up Flagyl.

## 2022-10-14 ENCOUNTER — Ambulatory Visit: Payer: Self-pay | Admitting: *Deleted

## 2022-10-14 NOTE — Telephone Encounter (Signed)
  Chief Complaint: dry mouth Symptoms: vomiting and diarrhea at times Frequency: multiple times a week Pertinent Negatives: Patient denies fever Disposition: '[]'$ ED /'[]'$ Urgent Care (no appt availability in office) / '[x]'$ Appointment(In office/virtual)/ '[]'$  [place] [facility]/ '[]'$ Home Care/ '[]'$ Refused Recommended Disposition /'[]'$ [facility]/ '[]'$  Follow-up with PCP Additional Notes: appt tomorrow, pt states he has lost a lot of weight. States only eats 300 calories a day, his wound on his leg is not healing. Home care, nutrition discussed, virtual appt tomorrow.  Reason for Disposition  [1] MILD or MODERATE vomiting AND [2] present > 48 hours (2 days) (Exception: Mild vomiting with associated diarrhea.)  Answer Assessment - Initial Assessment Questions 1. VOMITING SEVERITY: "How many times have you vomited in the past 24 hours?" Several times a week, comes out of no where, food not digested.    - MILD:  1 - 2 times/day    - MODERATE: 3 - 5 times/day, decreased oral intake without significant weight loss or symptoms of dehydration    - SEVERE: 6 or more times/day, vomits everything or nearly everything, with significant weight loss, symptoms of dehydration      Significant weight loss 2. ONSET: "When did the vomiting begin?"      Several months 3. FLUIDS: "What fluids or food have you vomited up today?" "Have you been able to keep any fluids down?"     Only drinking water, vomits up mucous sometimes, mouth dry 4. ABDOMEN PAIN: "Are your having any abdomen pain?" If Yes : "How bad is it and what does it feel like?" (e.g., crampy, dull, intermittent, constant)      Abd  pain 5. DIARRHEA: "Is there any diarrhea?" If Yes, ask: "How many times today?"      Not daily usually 6. CONTACTS: "Is there anyone else in the family with the same symptoms?"      no 7. CAUSE: "What do you think is causing your vomiting?"     unknown 8. HYDRATION STATUS: "Any signs of dehydration?" (e.g., dry mouth  [not only dry lips], too weak to stand) "When did you last urinate?"     Mouth dry, urinating regularly 9. OTHER SYMPTOMS: "Do you have any other symptoms?" (e.g., fever, headache, vertigo, vomiting blood or coffee grounds, recent head injury)     No fever 10. PREGNANCY: "Is there any chance you are pregnant?" "When was your last menstrual period?"       no  Protocols used: [facility]

## 2022-10-15 ENCOUNTER — Telehealth (INDEPENDENT_AMBULATORY_CARE_PROVIDER_SITE_OTHER): Payer: BC Managed Care – PPO | Admitting: Family Medicine

## 2022-10-15 DIAGNOSIS — E559 Vitamin D deficiency, unspecified: Secondary | ICD-10-CM

## 2022-10-15 DIAGNOSIS — G4733 Obstructive sleep apnea (adult) (pediatric): Secondary | ICD-10-CM

## 2022-10-15 DIAGNOSIS — L03116 Cellulitis of left lower limb: Secondary | ICD-10-CM

## 2022-10-15 DIAGNOSIS — K117 Disturbances of salivary secretion: Secondary | ICD-10-CM

## 2022-10-15 DIAGNOSIS — Z7401 Bed confinement status: Secondary | ICD-10-CM | POA: Diagnosis not present

## 2022-10-15 DIAGNOSIS — R531 Weakness: Secondary | ICD-10-CM

## 2022-10-15 DIAGNOSIS — D72829 Elevated white blood cell count, unspecified: Secondary | ICD-10-CM

## 2022-10-15 DIAGNOSIS — E43 Unspecified severe protein-calorie malnutrition: Secondary | ICD-10-CM | POA: Diagnosis not present

## 2022-10-15 MED ORDER — DOXYCYCLINE HYCLATE 100 MG PO TABS: 100.0000 mg | ORAL_TABLET | Freq: Two times a day (BID) | ORAL | 0 refills | Status: DC

## 2022-10-15 NOTE — Progress Notes (Unsigned)
MyChart Video Visit    Virtual Visit via Video Note   This format is felt to be most appropriate for this patient at this time. Physical exam was limited by quality of the video and audio technology used for the visit.   Patient location: home Provider location: [facility]  I discussed the limitations of evaluation and management by telemedicine and the availability of in person appointments. The patient expressed understanding and agreed to proceed.  Patient: Christopher Herman   DOB: [birth year]   46 y.o. Male  MRN: [id number] Visit Date: 10/15/2022  Today's healthcare provider: Lelon Huh, MD   Chief Complaint  Patient presents with   Dry mouth   Subjective    HPI  Dry mouth: Patient complains of dry mouth for 2 months. He also reports episodes of vomiting and diarrhea. He feels that he has lost a lot of weight. Drinks several bottle of water every day. States only eats 300 calories a day. Also has small wound on his leg is not healing. The wound has a foul smelling pink/ [place] colored discharge. Patient states that he started  taking  Doxycyline '100mg'$  tablets twice daily one week ago. This was an old prescription that was prescribed several months ago.   He reports he has not been able to stand or walk since hospitalization in February . He previously declined home health referral due to cost.    States home health would cost $160 per visit.   Medications: Outpatient Medications Prior to Visit  Medication Sig   furosemide (LASIX) 40 MG tablet Take 1 tablet (40 mg total) by mouth daily as needed. Take one tablet daily   Multiple Vitamins-Minerals (MULTIVITAMIN WITH MINERALS) tablet Take 1 tablet by mouth daily.   silver sulfADIAZINE (SILVADENE) 1 % cream APPLY TOPICALLY TO THE AFFECTED AREA DAILY   simethicone (GAS-X) 80 MG chewable tablet Chew 1 tablet (80 mg total) by mouth 4 (four) times daily.   Cholecalciferol (VITAMIN D3) 50 MCG (2000 UT) capsule Take 1 capsule (2,000  Units total) by mouth daily. (Patient not taking: Reported on 10/15/2022)   cyclobenzaprine (FLEXERIL) 5 MG tablet Take 1 tablet (5 mg total) by mouth 3 (three) times daily as needed for muscle spasms. (Patient not taking: Reported on 10/15/2022)   Semaglutide-Weight Management (WEGOVY) 0.25 MG/0.5ML SOAJ Inject 0.25 mg into the skin once a week. Increase to 0.5 mg once a week starting with 5th dose (Patient not taking: Reported on 10/15/2022)   No facility-administered medications prior to visit.    Review of Systems  Constitutional:  Negative for appetite change, chills and fever.  HENT:         Dry mouth  Respiratory:  Negative for chest tightness, shortness of breath and wheezing.   Cardiovascular:  Negative for chest pain and palpitations.  Gastrointestinal:  Positive for diarrhea and vomiting. Negative for abdominal pain and nausea.    {Labs  Heme  Chem  Endocrine  Serology  Results Review (optional):23779}   Objective    There were no vitals taken for this visit.  {Show previous vital signs (optional):23777}   Physical Exam     Assessment & Plan     *** NEEDS HOME HEALTH BUT STATES TOO EXPENSIVE No follow-ups on file.     I discussed the assessment and treatment plan with the patient. The patient was provided an opportunity to ask questions and all were answered. The patient agreed with the plan and demonstrated an understanding of the instructions.  The patient was advised to call back or seek an in-person evaluation if the symptoms worsen or if the condition fails to improve as anticipated.  I provided *** minutes of non-face-to-face time during this encounter.  {provider attestation***:1}  Lelon Huh, MD [facility] [phone] (phone) [phone] (fax)  [place]

## 2022-11-02 ENCOUNTER — Other Ambulatory Visit: Payer: Self-pay | Admitting: Family Medicine

## 2022-11-04 ENCOUNTER — Ambulatory Visit: Payer: Self-pay | Admitting: *Deleted

## 2022-11-05 NOTE — Patient Instructions (Addendum)
Visit Information  Thank you for taking time to visit with me today. Please don't hesitate to contact me if I can be of assistance to you.   Following are the goals we discussed today:   Goals Addressed             This Visit's Progress    "I would like to be able to stand"       Care Coordination Interventions: Care Management program discussed with patient  SDOH screening completed Patient confirmed that he is bed bound, has not stood in several months and has not seen his provider face to face since approximately 2019. Patient confirmed that drainage in his legs has improved-his continues on antibiotics He confirms that his main goal is to improve his stamina and endurance Patient's mother  confirmed to be his  primary caregiver-she assist with his ADL's  Available options discussed with RNCM and leadership Patient has declined home health services due to co-pays, referral to [facility]  for in home medical care discussed however once it was explained that he would have to change providers he abruptly ended the call This social worker to plan to contact patient with the next week to re-visit his concerns and to offer additional support Depression screen reviewed  Solution-Focused Strategies employed:  Active listening / Reflection utilized  Emotional Support Provided         Our next appointment is by telephone on 11/12/22 at 2pm  Please call the care guide team at [phone] if you need to cancel or reschedule your appointment.   If you are experiencing a Mental Health or [place] or need someone to talk to, please call the Suicide and Crisis Lifeline: 988   Patient verbalizes understanding of instructions and care plan provided today and agrees to view in [place]. Active MyChart status and patient understanding of how to access instructions and care plan via MyChart confirmed with patient.     Telephone follow up appointment with care management team  member scheduled for: 11/12/22  Sheralyn Boatman [facility] Care Management [phone]

## 2022-11-05 NOTE — Patient Outreach (Addendum)
  Care Coordination   Initial Visit Note   11/05/2022 Name: Christopher Herman MRN: [id number] DOB: [birth year]  Christopher Herman is a 46 y.o. year old male who sees Fisher, Kirstie Peri, MD for primary care. I spoke with  Christopher Herman by phone today.  What matters to the patients health and wellness today?  To stand, improve his stamina and endurance    Goals Addressed             This Visit's Progress    "I would like to be able to stand"       Care Coordination Interventions: Care Management program discussed with patient  SDOH screening completed Patient confirmed that he is bed bound, has not stood in several months and has not seen his provider face to face since approximately 2019. Patient confirmed that drainage in his legs has improved-his continues on antibiotics He confirms that his main goal is to improve his stamina and endurance Patient's mother  confirmed to be his  primary caregiver-she assist with his ADL's  Available options discussed with RNCM and leadership Patient has declined home health services due to co-pays, referral to [facility]  for in home medical care discussed however once it was explained that he would have to change providers he abruptly ended the call This social worker to plan to contact patient with the next week to re-visit his concerns and to offer additional support Depression screen reviewed  Solution-Focused Strategies employed:  Active listening / Reflection utilized  Emotional Support Provided         SDOH assessments and interventions completed:  Yes  SDOH Interventions Today    Flowsheet Row Most Recent Value  SDOH Interventions   Food Insecurity Interventions Intervention Not Indicated  Housing Interventions Intervention Not Indicated  Transportation Interventions Intervention Not Indicated  Physical Activity Interventions Intervention Not Indicated        Care Coordination Interventions Activated:  Yes  Care Coordination  Interventions:  Yes, provided   Follow up plan: Follow up call scheduled for 11/12/22    Encounter Outcome:  Pt. Visit Completed

## 2022-11-12 ENCOUNTER — Ambulatory Visit: Payer: Self-pay | Admitting: *Deleted

## 2022-11-12 NOTE — Patient Outreach (Signed)
  Care Coordination   11/12/2022 Name: Christopher Herman MRN: [id number] DOB: 1976/03/09   Care Coordination Outreach Attempts:  An unsuccessful telephone outreach was attempted for a scheduled appointment today.  Follow Up Plan:  Additional outreach attempts will be made to offer the patient care coordination information and services.   Encounter Outcome:  Pt. Visit Completed  Care Coordination Interventions Activated:  No   Care Coordination Interventions:  No, not indicated    Kenderick Kobler, [place] Worker  [facility] Care Management [phone]

## 2022-11-19 ENCOUNTER — Encounter: Payer: Self-pay | Admitting: *Deleted

## 2022-11-24 ENCOUNTER — Encounter: Payer: Self-pay | Admitting: *Deleted

## 2022-11-24 ENCOUNTER — Telehealth: Payer: Self-pay | Admitting: *Deleted

## 2022-11-24 NOTE — Patient Outreach (Signed)
  Care Coordination   11/24/2022 Name: Christopher Herman MRN: [id number] DOB: 04-24-1976   Care Coordination Outreach Attempts:  A second unsuccessful outreach was attempted today to offer the patient with information about available care coordination services as a benefit of their health plan.     Follow Up Plan:  Additional outreach attempts will be made to offer the patient care coordination information and services.   Encounter Outcome:  No Answer   Care Coordination Interventions:  No, not indicated    Treysen Sudbeck, [place] Worker  [facility] Care Management [phone]

## 2022-12-20 IMAGING — US US ABDOMEN COMPLETE
1 series · 14 of 25 positions shown · non-contrast
Comparison: None.

CLINICAL DATA: Sepsis.

EXAM:
ABDOMEN ULTRASOUND COMPLETE

[Series 1: us abdomen complete · 14 of 70 slices shown]
[im 1/70]
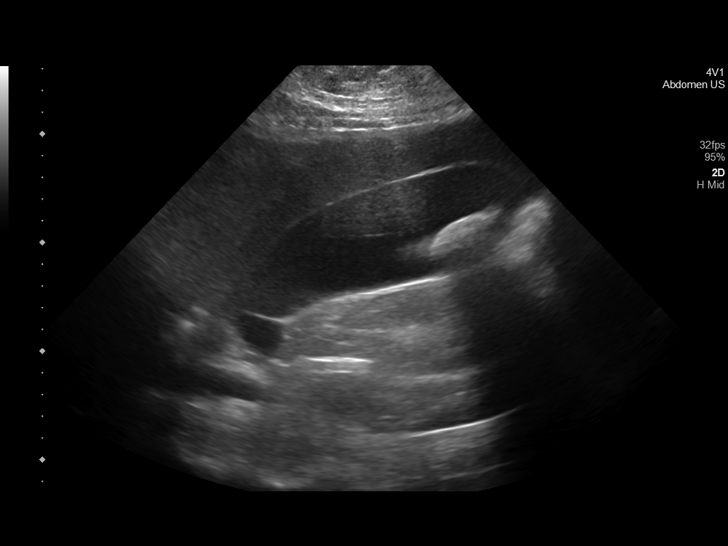
[im 6/70]
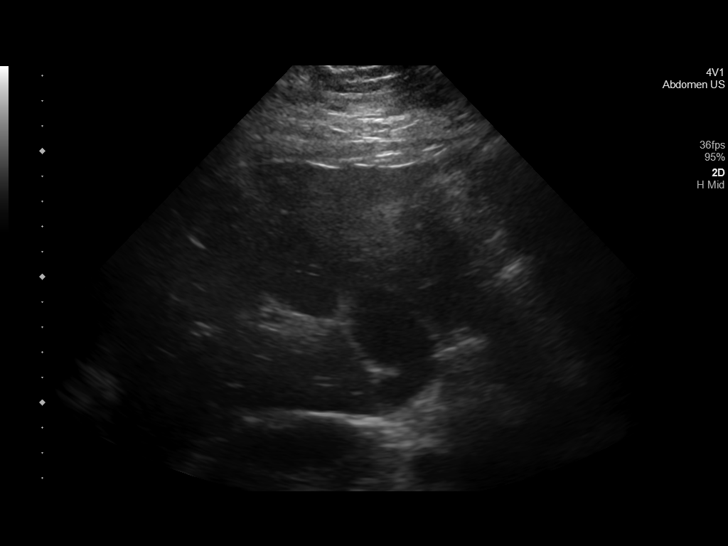
[im 12/70]
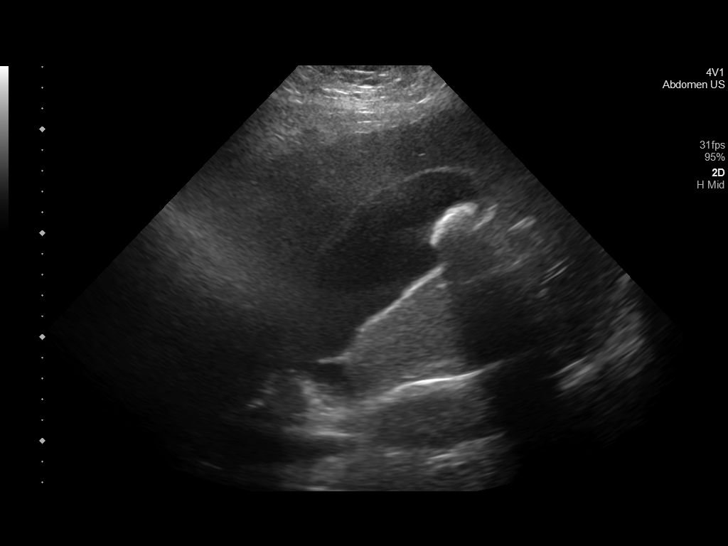
[im 18/70]
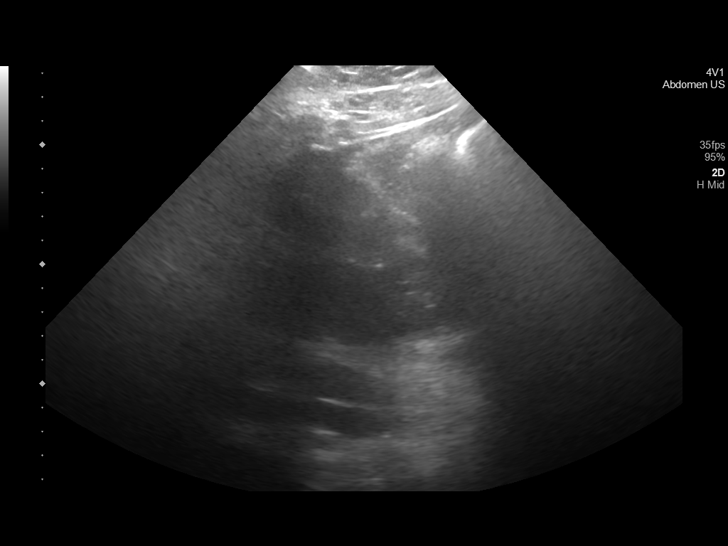
[im 24/70]
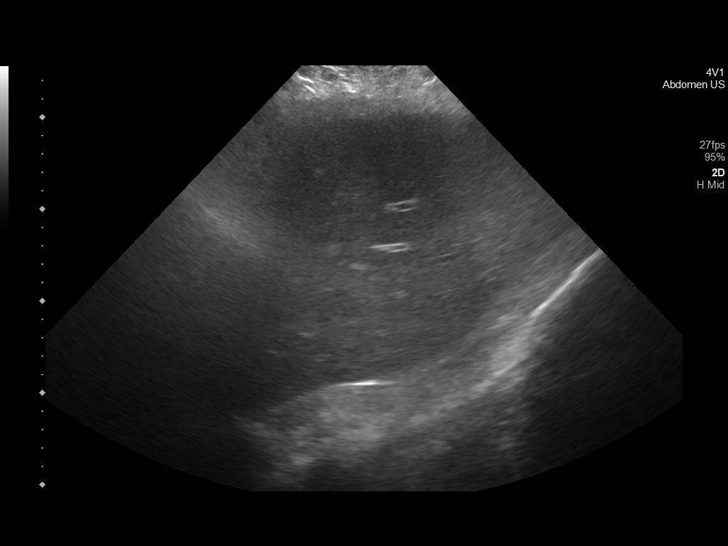
[im 26/70]
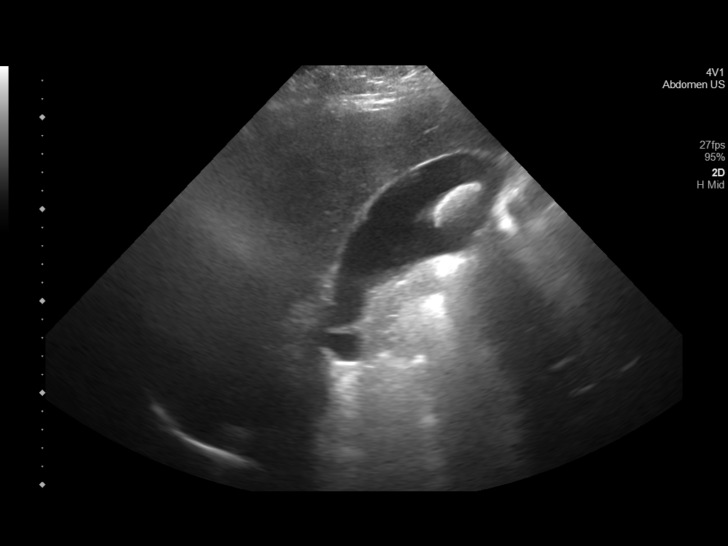
[im 32/70]
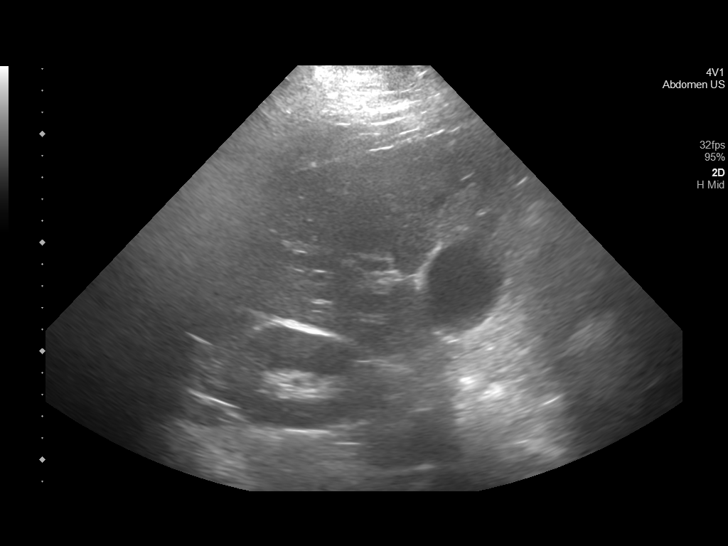
[im 38/70]
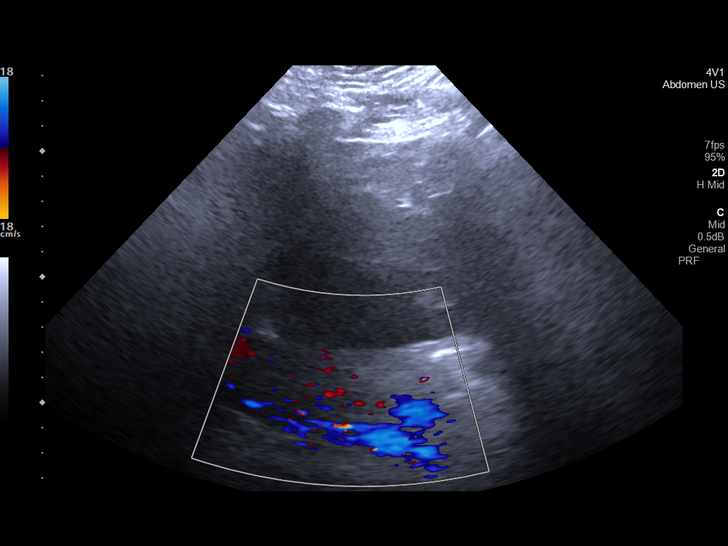
[im 44/70]
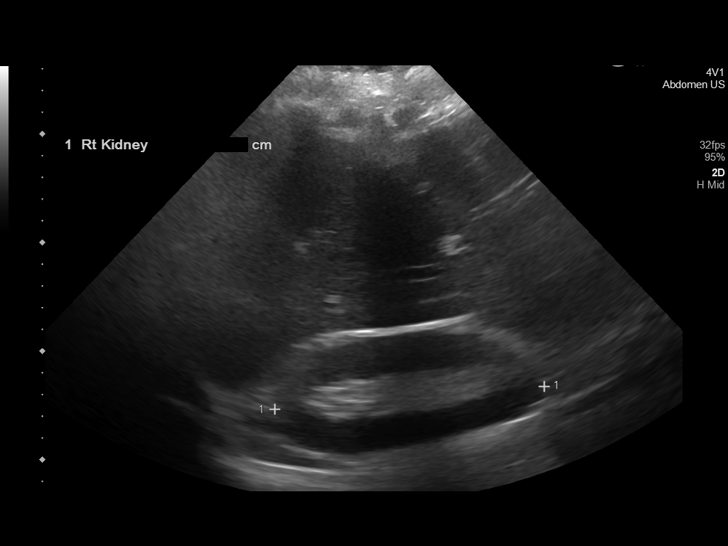
[im 47/70]
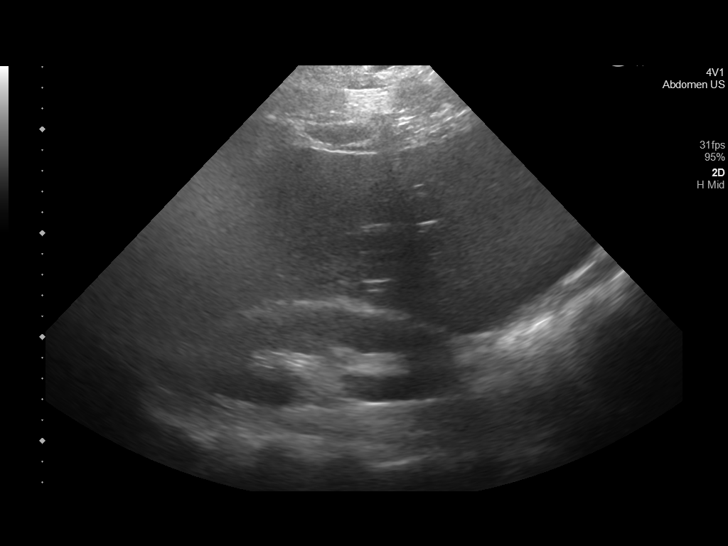
[im 52/70]
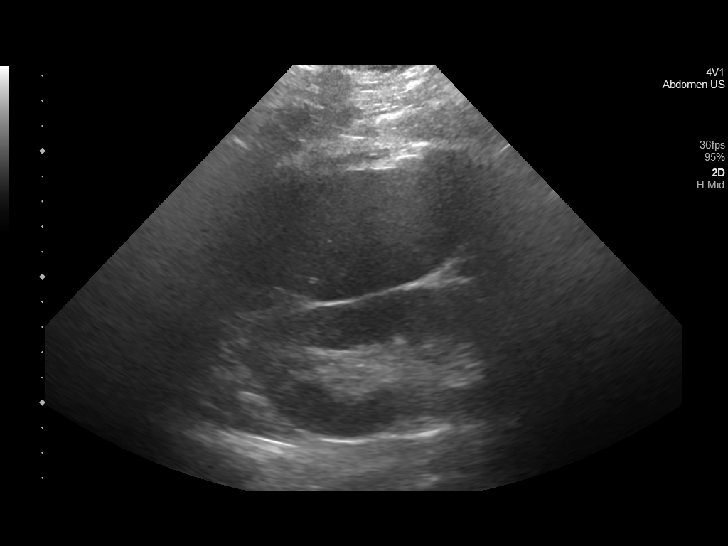
[im 58/70]
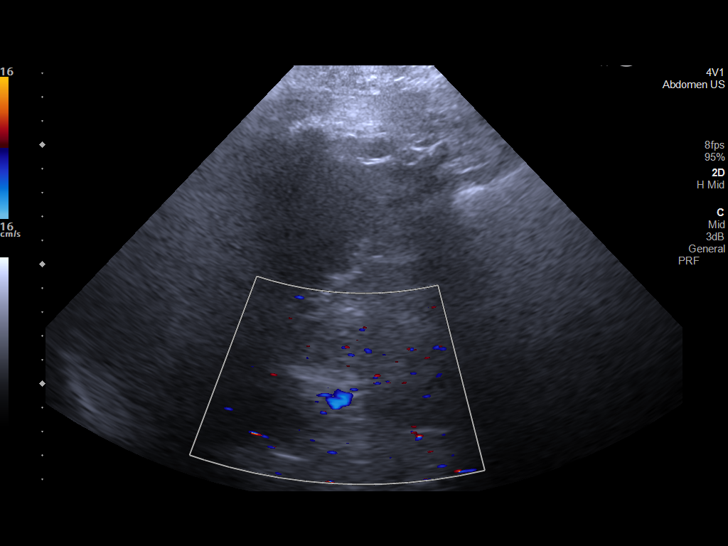
[im 64/70]
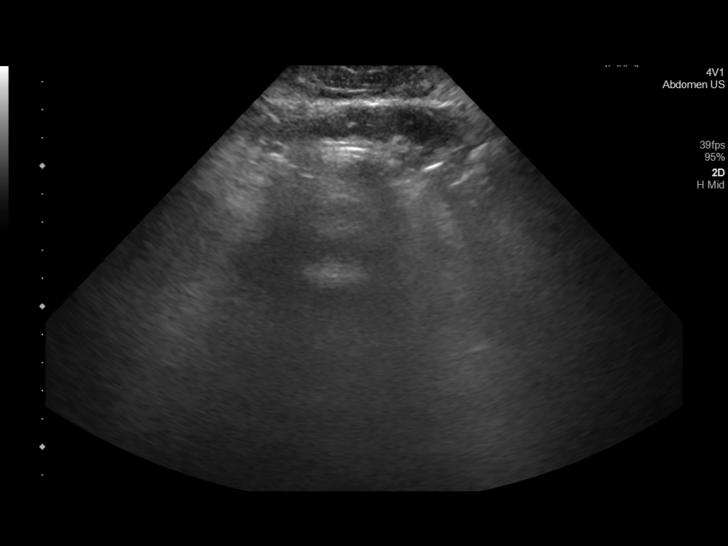
[im 70/70]
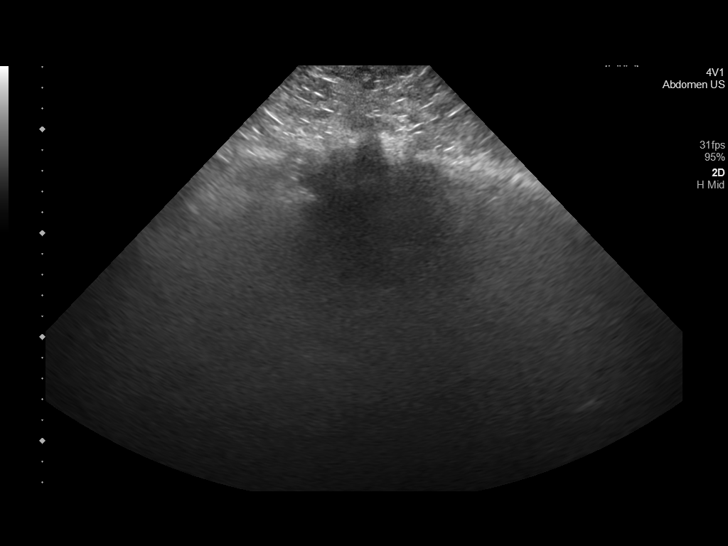

[14 of 25 positions shown; findings below may reference images not displayed]

FINDINGS: Gallbladder: Gallstone. No gallbladder wall thickening or
pericholecystic fluid. Negative sonographic Murphy's sign.

Common bile duct: Diameter: 3 mm

Liver: The liver is enlarged measuring 25 cm in craniocaudal length.
Portal vein is patent on color Doppler imaging with normal direction
of blood flow towards the liver.

IVC: No abnormality visualized.

Pancreas: Not visualized and obscured by bowel gas.

Spleen: Size and appearance within normal limits.

Right Kidney: Length: 12.5 cm. Normal echogenicity. No
hydronephrosis or shadowing stone.

Left Kidney: Length: 13.1 cm. Normal echogenicity. No hydronephrosis
or shadowing stone.

Abdominal aorta: No aneurysm visualized.

Other findings: None.
IMPRESSION: 1. Cholelithiasis without sonographic evidence of acute
cholecystitis.
2. Hepatomegaly.

## 2022-12-23 ENCOUNTER — Telehealth: Payer: Self-pay | Admitting: *Deleted

## 2022-12-23 NOTE — Patient Outreach (Signed)
  Care Coordination   Follow Up Visit Note   12/23/2022 Name: Christopher Herman MRN: [id number] DOB: Aug 05, 1976  Christopher Herman is a 46 y.o. year old male who sees Fisher, Kirstie Peri, MD for primary care. I spoke with  Christopher Herman by phone today.  What matters to the patients health and wellness today?  In home care options    Goals Addressed             This Visit's Progress    "I would like to be able to stand"       Care Coordination Interventions: Patient continues to confirm that he is confirmed that he is bed bound, has not stood in several months and has not seen his provider face to face since approximately 2019. Patient further confirms that he has no range of motion in his legs-cannot flex them at all-would like to go into rehab Patient's mother continues  to be his  primary caregiver-she assist with his ADL's  Patient states continued plan to stand and would like to go to inpatient rehab Patient now willing to consider home health services or referral to [place]  for in home medical care-he would like to discuss these options with his family first before a decision is made He is also willing to consider in mental health therapy This social worker to plan to contact patient  next week to re-visit his concerns and to offer additional support Solution-Focused Strategies employed:  Active listening / Reflection utilized  Emotional Support Provided Positive reinforcement provided for re-considering in home care for ongoing treatment         SDOH assessments and interventions completed:  No     Care Coordination Interventions:  Yes, provided   Follow up plan: Follow up call scheduled for 01/02/23    Encounter Outcome:  Pt. Visit Completed

## 2022-12-23 NOTE — Patient Instructions (Signed)
Visit Information  Thank you for taking time to visit with me today. Please don't hesitate to contact me if I can be of assistance to you.   Following are the goals we discussed today:   Goals Addressed             This Visit's Progress    "I would like to be able to stand"       Care Coordination Interventions: Patient continues to confirm that he is confirmed that he is bed bound, has not stood in several months and has not seen his provider face to face since approximately 2019. Patient further confirms that he has no range of motion in his legs-cannot flex them at all-would like to go into rehab Patient's mother continues  to be his  primary caregiver-she assist with his ADL's  Patient states continued plan to stand and would like to go to inpatient rehab Patient now willing to consider home health services or referral to [place]  for in home medical care-he would like to discuss these options with his family first before a decision is made He is also willing to consider in mental health therapy This social worker to plan to contact patient  next week to re-visit his concerns and to offer additional support Solution-Focused Strategies employed:  Active listening / Reflection utilized  Emotional Support Provided Positive reinforcement provided for re-considering in home care for ongoing treatment         Our next appointment is by telephone on 01/02/23 at 2pm  Please call the care guide team at [phone] if you need to cancel or reschedule your appointment.   If you are experiencing a Mental Health or [place] or need someone to talk to, please call [phone]   Patient verbalizes understanding of instructions and care plan provided today and agrees to view in [place]. Active MyChart status and patient understanding of how to access instructions and care plan via MyChart confirmed with patient.     Telephone follow up appointment with care management team  member scheduled for: 01/02/23  Elliot Gurney, [place] Worker  [facility] Care Management [phone]

## 2023-01-01 ENCOUNTER — Other Ambulatory Visit: Payer: Self-pay | Admitting: Family Medicine

## 2023-01-01 DIAGNOSIS — L03116 Cellulitis of left lower limb: Secondary | ICD-10-CM

## 2023-01-02 ENCOUNTER — Ambulatory Visit: Payer: Self-pay | Admitting: *Deleted

## 2023-01-02 NOTE — Patient Outreach (Signed)
  Care Coordination   Follow Up Visit Note   01/02/2023 Name: BLADEN UMAR MRN: [id number] DOB: [birth year]  SHERARD SUTCH is a 47 y.o. year old male who sees Fisher, Kirstie Peri, MD for primary care. I spoke with  Joylene Grapes by phone today.  What matters to the patients health and wellness today?  [facility]    Goals Addressed             This Visit's Progress    "I would like to be able to stand"       Care Coordination Interventions: Follow up phone call to patient regarding referral to [facility]-patient agreeable to referral to [place] Patient bedbound and per patient, has not been seen by his provider since 2019 Referral paperwork to be completed and faxed over for review         SDOH assessments and interventions completed:  No     Care Coordination Interventions:  Yes, provided   Follow up plan: No further intervention required.   Encounter Outcome:  Pt. Visit Completed

## 2023-01-02 NOTE — Patient Instructions (Signed)
Visit Information  Thank you for taking time to visit with me today. Please don't hesitate to contact me if I can be of assistance to you.   Following are the goals we discussed today:   Goals Addressed             This Visit's Progress    "I would like to be able to stand"       Care Coordination Interventions: Follow up phone call to patient regarding referral to [facility]-patient agreeable to referral to [place] Patient bedbound and per patient, has not been seen by his provider since 2019 Referral paperwork to be completed and faxed over for review           Please call the care guide team at [phone] if you need to cancel or reschedule your appointment.   If you are experiencing a Mental Health or [place] or need someone to talk to, please call [phone]   Patient verbalizes understanding of instructions and care plan provided today and agrees to view in [place]. Active MyChart status and patient understanding of how to access instructions and care plan via MyChart confirmed with patient.     No further follow up required: patient to be referred to [place] for in home follow up  [facility], [place] Worker  [facility] Care Management [phone]

## 2023-02-03 ENCOUNTER — Telehealth: Payer: Self-pay | Admitting: *Deleted

## 2023-02-03 NOTE — Patient Outreach (Signed)
  Care Coordination   Follow Up Visit Note   02/03/2023 Name: Christopher Herman MRN: [id number] DOB: 1976/01/13  Christopher Herman is a 47 y.o. year old male who sees Fisher, Kirstie Peri, MD for primary care. I spoke with Christopher Herman by phone today.  What matters to the patients health and wellness today?  In home provider follow up    Goals Addressed             This Visit's Progress    "I would like to be able to stand"       Care Coordination Interventions: Follow up phone call to [facility]-confirmed that they have received the referral and have reached out to the patient. Confirmed that patient was contacted on 01/21/23 and  is now in the system, they are waiting for patient to call back to schedule the initial appointment after checking with is work schedule(patient works remote) Probation officer with request to reach back out to patient for follow up on referral          SDOH assessments and interventions completed:  No     Care Coordination Interventions:  Yes, provided  Interventions Today    Flowsheet Row Most Recent Value  General Interventions   General Interventions Discussed/Reviewed Hospital doctor to [facility]]        Follow up plan: No further intervention required.   Encounter Outcome:  Pt. Visit Completed

## 2023-02-11 ENCOUNTER — Telehealth: Payer: Self-pay | Admitting: *Deleted

## 2023-02-11 NOTE — Patient Outreach (Signed)
  Care Coordination   02/11/2023 Name: Christopher Herman MRN: [id number] DOB: 28-Jun-1976   Care Coordination Outreach Attempts:  An unsuccessful telephone outreach was attempted today to offer the patient information about available care coordination services as a benefit of their health plan.   Follow Up Plan:  Additional outreach attempts will be made to offer the patient care coordination information and services.   Encounter Outcome:  No Answer   Care Coordination Interventions:  No, not indicated    Callaghan Laverdure, [place] Worker  [facility] Care Management [phone]

## 2023-02-16 ENCOUNTER — Telehealth: Payer: Self-pay | Admitting: *Deleted

## 2023-02-16 NOTE — Patient Outreach (Signed)
  Care Coordination   02/16/2023 Name: Christopher Herman MRN: [id number] DOB: 20-Oct-1976   Care Coordination Outreach Attempts:  A third unsuccessful outreach was attempted today to offer the patient with information about available care coordination services as a benefit of their health plan.   Follow Up Plan:  No further outreach attempts will be made at this time. We have been unable to contact the patient to offer or enroll patient in care coordination services  Encounter Outcome:  No Answer   Care Coordination Interventions:  No, not indicated    Wilkes Potvin, [place] Worker  [facility] Care Management [phone]

## 2023-02-27 ENCOUNTER — Telehealth: Payer: Self-pay

## 2023-02-27 NOTE — Telephone Encounter (Signed)
Copied from [place] [id number]. Topic: General - Other >> Feb 27, 2023  8:56 AM Leone Payor F wrote: Reason for CRM: Patient wants to know if Dr. Caryn Section can fill out patient's FMLA paperwork one more time for this year. Patient says you can follow up with them at [phone].

## 2023-04-21 ENCOUNTER — Emergency Department: Payer: BC Managed Care – PPO

## 2023-04-21 ENCOUNTER — Other Ambulatory Visit: Payer: Self-pay

## 2023-04-21 ENCOUNTER — Inpatient Hospital Stay: Payer: BC Managed Care – PPO

## 2023-04-21 ENCOUNTER — Inpatient Hospital Stay
Admission: EM | Admit: 2023-04-21 | Discharge: 2023-05-23 | DRG: 871 | Disposition: A | Discharge status: Home / Self Care | Payer: BC Managed Care – PPO | Attending: Obstetrics and Gynecology | Admitting: Obstetrics and Gynecology

## 2023-04-21 DIAGNOSIS — E8809 Other disorders of plasma-protein metabolism, not elsewhere classified: Secondary | ICD-10-CM | POA: Diagnosis present

## 2023-04-21 DIAGNOSIS — G8929 Other chronic pain: Secondary | ICD-10-CM | POA: Diagnosis present

## 2023-04-21 DIAGNOSIS — L299 Pruritus, unspecified: Secondary | ICD-10-CM | POA: Diagnosis not present

## 2023-04-21 DIAGNOSIS — E039 Hypothyroidism, unspecified: Secondary | ICD-10-CM | POA: Diagnosis present

## 2023-04-21 DIAGNOSIS — A4152 Sepsis due to Pseudomonas: Principal | ICD-10-CM | POA: Diagnosis present

## 2023-04-21 DIAGNOSIS — L89319 Pressure ulcer of right buttock, unspecified stage: Secondary | ICD-10-CM | POA: Diagnosis not present

## 2023-04-21 DIAGNOSIS — Z6841 Body Mass Index (BMI) 40.0 and over, adult: Secondary | ICD-10-CM

## 2023-04-21 DIAGNOSIS — E1122 Type 2 diabetes mellitus with diabetic chronic kidney disease: Secondary | ICD-10-CM | POA: Diagnosis not present

## 2023-04-21 DIAGNOSIS — R61 Generalized hyperhidrosis: Secondary | ICD-10-CM | POA: Diagnosis not present

## 2023-04-21 DIAGNOSIS — Z515 Encounter for palliative care: Secondary | ICD-10-CM

## 2023-04-21 DIAGNOSIS — R609 Edema, unspecified: Secondary | ICD-10-CM | POA: Diagnosis not present

## 2023-04-21 DIAGNOSIS — E8721 Acute metabolic acidosis: Secondary | ICD-10-CM | POA: Diagnosis present

## 2023-04-21 DIAGNOSIS — I130 Hypertensive heart and chronic kidney disease with heart failure and stage 1 through stage 4 chronic kidney disease, or unspecified chronic kidney disease: Secondary | ICD-10-CM | POA: Diagnosis present

## 2023-04-21 DIAGNOSIS — E43 Unspecified severe protein-calorie malnutrition: Secondary | ICD-10-CM

## 2023-04-21 DIAGNOSIS — N189 Chronic kidney disease, unspecified: Secondary | ICD-10-CM | POA: Diagnosis present

## 2023-04-21 DIAGNOSIS — N170 Acute kidney failure with tubular necrosis: Secondary | ICD-10-CM | POA: Insufficient documentation

## 2023-04-21 DIAGNOSIS — E538 Deficiency of other specified B group vitamins: Secondary | ICD-10-CM | POA: Diagnosis present

## 2023-04-21 DIAGNOSIS — K529 Noninfective gastroenteritis and colitis, unspecified: Secondary | ICD-10-CM | POA: Diagnosis present

## 2023-04-21 DIAGNOSIS — R69 Illness, unspecified: Secondary | ICD-10-CM | POA: Diagnosis not present

## 2023-04-21 DIAGNOSIS — M79641 Pain in right hand: Secondary | ICD-10-CM | POA: Diagnosis not present

## 2023-04-21 DIAGNOSIS — E440 Moderate protein-calorie malnutrition: Secondary | ICD-10-CM | POA: Diagnosis not present

## 2023-04-21 DIAGNOSIS — Y92239 Unspecified place in hospital as the place of occurrence of the external cause: Secondary | ICD-10-CM | POA: Diagnosis not present

## 2023-04-21 DIAGNOSIS — A419 Sepsis, unspecified organism: Principal | ICD-10-CM

## 2023-04-21 DIAGNOSIS — I9589 Other hypotension: Secondary | ICD-10-CM | POA: Diagnosis present

## 2023-04-21 DIAGNOSIS — R7881 Bacteremia: Secondary | ICD-10-CM

## 2023-04-21 DIAGNOSIS — L89512 Pressure ulcer of right ankle, stage 2: Secondary | ICD-10-CM | POA: Diagnosis present

## 2023-04-21 DIAGNOSIS — I5042 Chronic combined systolic (congestive) and diastolic (congestive) heart failure: Secondary | ICD-10-CM | POA: Diagnosis not present

## 2023-04-21 DIAGNOSIS — T07XXXA Unspecified multiple injuries, initial encounter: Secondary | ICD-10-CM | POA: Diagnosis not present

## 2023-04-21 DIAGNOSIS — I890 Lymphedema, not elsewhere classified: Secondary | ICD-10-CM | POA: Diagnosis not present

## 2023-04-21 DIAGNOSIS — E861 Hypovolemia: Secondary | ICD-10-CM

## 2023-04-21 DIAGNOSIS — E669 Obesity, unspecified: Secondary | ICD-10-CM

## 2023-04-21 DIAGNOSIS — N390 Urinary tract infection, site not specified: Secondary | ICD-10-CM | POA: Diagnosis present

## 2023-04-21 DIAGNOSIS — R6521 Severe sepsis with septic shock: Secondary | ICD-10-CM | POA: Diagnosis present

## 2023-04-21 DIAGNOSIS — L03115 Cellulitis of right lower limb: Secondary | ICD-10-CM | POA: Diagnosis present

## 2023-04-21 DIAGNOSIS — I5022 Chronic systolic (congestive) heart failure: Secondary | ICD-10-CM | POA: Diagnosis not present

## 2023-04-21 DIAGNOSIS — Z79899 Other long term (current) drug therapy: Secondary | ICD-10-CM

## 2023-04-21 DIAGNOSIS — R32 Unspecified urinary incontinence: Secondary | ICD-10-CM | POA: Diagnosis not present

## 2023-04-21 DIAGNOSIS — Z9884 Bariatric surgery status: Secondary | ICD-10-CM | POA: Diagnosis not present

## 2023-04-21 DIAGNOSIS — F32A Depression, unspecified: Secondary | ICD-10-CM | POA: Diagnosis present

## 2023-04-21 DIAGNOSIS — I959 Hypotension, unspecified: Secondary | ICD-10-CM | POA: Diagnosis not present

## 2023-04-21 DIAGNOSIS — I82412 Acute embolism and thrombosis of left femoral vein: Secondary | ICD-10-CM | POA: Diagnosis not present

## 2023-04-21 DIAGNOSIS — I509 Heart failure, unspecified: Secondary | ICD-10-CM | POA: Diagnosis not present

## 2023-04-21 DIAGNOSIS — Z95828 Presence of other vascular implants and grafts: Secondary | ICD-10-CM | POA: Diagnosis not present

## 2023-04-21 DIAGNOSIS — Z1639 Resistance to other specified antimicrobial drug: Secondary | ICD-10-CM | POA: Diagnosis present

## 2023-04-21 DIAGNOSIS — R531 Weakness: Secondary | ICD-10-CM

## 2023-04-21 DIAGNOSIS — I129 Hypertensive chronic kidney disease with stage 1 through stage 4 chronic kidney disease, or unspecified chronic kidney disease: Secondary | ICD-10-CM | POA: Diagnosis not present

## 2023-04-21 DIAGNOSIS — E876 Hypokalemia: Secondary | ICD-10-CM | POA: Diagnosis present

## 2023-04-21 DIAGNOSIS — Z8614 Personal history of Methicillin resistant Staphylococcus aureus infection: Secondary | ICD-10-CM | POA: Diagnosis not present

## 2023-04-21 DIAGNOSIS — R600 Localized edema: Secondary | ICD-10-CM | POA: Diagnosis not present

## 2023-04-21 DIAGNOSIS — L97319 Non-pressure chronic ulcer of right ankle with unspecified severity: Secondary | ICD-10-CM

## 2023-04-21 DIAGNOSIS — R5383 Other fatigue: Secondary | ICD-10-CM | POA: Diagnosis not present

## 2023-04-21 DIAGNOSIS — D631 Anemia in chronic kidney disease: Secondary | ICD-10-CM | POA: Diagnosis not present

## 2023-04-21 DIAGNOSIS — E2740 Unspecified adrenocortical insufficiency: Secondary | ICD-10-CM | POA: Diagnosis present

## 2023-04-21 DIAGNOSIS — I82411 Acute embolism and thrombosis of right femoral vein: Secondary | ICD-10-CM | POA: Diagnosis not present

## 2023-04-21 DIAGNOSIS — Z7401 Bed confinement status: Secondary | ICD-10-CM

## 2023-04-21 DIAGNOSIS — R1310 Dysphagia, unspecified: Secondary | ICD-10-CM | POA: Diagnosis present

## 2023-04-21 DIAGNOSIS — Z1152 Encounter for screening for COVID-19: Secondary | ICD-10-CM

## 2023-04-21 DIAGNOSIS — R601 Generalized edema: Secondary | ICD-10-CM | POA: Diagnosis not present

## 2023-04-21 DIAGNOSIS — B965 Pseudomonas (aeruginosa) (mallei) (pseudomallei) as the cause of diseases classified elsewhere: Secondary | ICD-10-CM | POA: Diagnosis not present

## 2023-04-21 DIAGNOSIS — E871 Hypo-osmolality and hyponatremia: Secondary | ICD-10-CM | POA: Diagnosis present

## 2023-04-21 DIAGNOSIS — R579 Shock, unspecified: Secondary | ICD-10-CM | POA: Diagnosis not present

## 2023-04-21 DIAGNOSIS — M7989 Other specified soft tissue disorders: Secondary | ICD-10-CM | POA: Diagnosis not present

## 2023-04-21 DIAGNOSIS — R000 Tachycardia, unspecified: Secondary | ICD-10-CM | POA: Diagnosis present

## 2023-04-21 DIAGNOSIS — M6281 Muscle weakness (generalized): Secondary | ICD-10-CM | POA: Diagnosis not present

## 2023-04-21 DIAGNOSIS — I82432 Acute embolism and thrombosis of left popliteal vein: Secondary | ICD-10-CM | POA: Diagnosis not present

## 2023-04-21 DIAGNOSIS — I824Z2 Acute embolism and thrombosis of unspecified deep veins of left distal lower extremity: Secondary | ICD-10-CM | POA: Diagnosis not present

## 2023-04-21 DIAGNOSIS — Z751 Person awaiting admission to adequate facility elsewhere: Secondary | ICD-10-CM

## 2023-04-21 DIAGNOSIS — E860 Dehydration: Secondary | ICD-10-CM | POA: Diagnosis present

## 2023-04-21 DIAGNOSIS — G4733 Obstructive sleep apnea (adult) (pediatric): Secondary | ICD-10-CM | POA: Diagnosis present

## 2023-04-21 DIAGNOSIS — E041 Nontoxic single thyroid nodule: Secondary | ICD-10-CM | POA: Diagnosis not present

## 2023-04-21 DIAGNOSIS — T368X5A Adverse effect of other systemic antibiotics, initial encounter: Secondary | ICD-10-CM | POA: Diagnosis present

## 2023-04-21 DIAGNOSIS — L03116 Cellulitis of left lower limb: Secondary | ICD-10-CM | POA: Diagnosis present

## 2023-04-21 DIAGNOSIS — Z87891 Personal history of nicotine dependence: Secondary | ICD-10-CM

## 2023-04-21 DIAGNOSIS — M79642 Pain in left hand: Secondary | ICD-10-CM | POA: Diagnosis not present

## 2023-04-21 DIAGNOSIS — Z79890 Hormone replacement therapy (postmenopausal): Secondary | ICD-10-CM

## 2023-04-21 DIAGNOSIS — R9431 Abnormal electrocardiogram [ECG] [EKG]: Secondary | ICD-10-CM | POA: Diagnosis present

## 2023-04-21 DIAGNOSIS — Z452 Encounter for adjustment and management of vascular access device: Secondary | ICD-10-CM | POA: Diagnosis not present

## 2023-04-21 DIAGNOSIS — M21371 Foot drop, right foot: Secondary | ICD-10-CM | POA: Diagnosis present

## 2023-04-21 DIAGNOSIS — S30810A Abrasion of lower back and pelvis, initial encounter: Secondary | ICD-10-CM | POA: Diagnosis not present

## 2023-04-21 DIAGNOSIS — M21372 Foot drop, left foot: Secondary | ICD-10-CM | POA: Diagnosis present

## 2023-04-21 DIAGNOSIS — N179 Acute kidney failure, unspecified: Secondary | ICD-10-CM | POA: Diagnosis not present

## 2023-04-21 DIAGNOSIS — Z7189 Other specified counseling: Secondary | ICD-10-CM | POA: Diagnosis not present

## 2023-04-21 DIAGNOSIS — I10 Essential (primary) hypertension: Secondary | ICD-10-CM | POA: Diagnosis not present

## 2023-04-21 DIAGNOSIS — N200 Calculus of kidney: Secondary | ICD-10-CM | POA: Diagnosis not present

## 2023-04-21 DIAGNOSIS — D75839 Thrombocytosis, unspecified: Secondary | ICD-10-CM | POA: Diagnosis present

## 2023-04-21 LAB — BLOOD GAS, VENOUS
Acid-base deficit: 5.9 mmol/L — ABNORMAL HIGH (ref 0.0–2.0)
Bicarbonate: 18.6 mmol/L — ABNORMAL LOW (ref 20.0–28.0)
O2 Saturation: 59.8 %
Patient temperature: 37
pCO2, Ven: 33 mmHg — ABNORMAL LOW (ref 44–60)
pH, Ven: 7.36 (ref 7.25–7.43)
pO2, Ven: 40 mmHg (ref 32–45)

## 2023-04-21 LAB — TROPONIN I (HIGH SENSITIVITY)
Troponin I (High Sensitivity): 6 ng/L (ref <18)
Troponin I (High Sensitivity): 7 ng/L (ref <18)

## 2023-04-21 LAB — URINALYSIS, W/ REFLEX TO CULTURE (INFECTION SUSPECTED)
Glucose, UA: NEGATIVE mg/dL
Ketones, ur: 5 mg/dL — AB
Nitrite: NEGATIVE
Protein, ur: 100 mg/dL — AB
Specific Gravity, Urine: 1.026 (ref 1.005–1.030)
pH: 5 (ref 5.0–8.0)

## 2023-04-21 LAB — CBC WITH DIFFERENTIAL/PLATELET
Abs Immature Granulocytes: 0.16 10*3/uL — ABNORMAL HIGH (ref 0.00–0.07)
Basophils Absolute: 0.1 10*3/uL (ref 0.0–0.1)
Basophils Relative: 0 %
Eosinophils Absolute: 0 10*3/uL (ref 0.0–0.5)
Eosinophils Relative: 0 %
HCT: 33.2 % — ABNORMAL LOW (ref 39.0–52.0)
Hemoglobin: 10.4 g/dL — ABNORMAL LOW (ref 13.0–17.0)
Immature Granulocytes: 1 %
Lymphocytes Relative: 8 %
Lymphs Abs: 2.1 10*3/uL (ref 0.7–4.0)
MCH: 26.7 pg (ref 26.0–34.0)
MCHC: 31.3 g/dL (ref 30.0–36.0)
MCV: 85.3 fL (ref 80.0–100.0)
Monocytes Absolute: 1.8 10*3/uL — ABNORMAL HIGH (ref 0.1–1.0)
Monocytes Relative: 7 %
Neutro Abs: 20.6 10*3/uL — ABNORMAL HIGH (ref 1.7–7.7)
Neutrophils Relative %: 84 %
Platelets: 541 10*3/uL — ABNORMAL HIGH (ref 150–400)
RBC: 3.89 MIL/uL — ABNORMAL LOW (ref 4.22–5.81)
RDW: 18.6 % — ABNORMAL HIGH (ref 11.5–15.5)
WBC: 24.6 10*3/uL — ABNORMAL HIGH (ref 4.0–10.5)
nRBC: 0 % (ref 0.0–0.2)

## 2023-04-21 LAB — COMPREHENSIVE METABOLIC PANEL
ALT: 11 U/L (ref 0–44)
AST: 16 U/L (ref 15–41)
Albumin: 1.9 g/dL — ABNORMAL LOW (ref 3.5–5.0)
Alkaline Phosphatase: 118 U/L (ref 38–126)
Anion gap: 11 (ref 5–15)
BUN: 30 mg/dL — ABNORMAL HIGH (ref 6–20)
CO2: 22 mmol/L (ref 22–32)
Calcium: 7.9 mg/dL — ABNORMAL LOW (ref 8.9–10.3)
Chloride: 96 mmol/L — ABNORMAL LOW (ref 98–111)
Creatinine, Ser: 0.82 mg/dL (ref 0.61–1.24)
GFR, Estimated: >60 mL/min (ref >60)
Glucose, Bld: 128 mg/dL — ABNORMAL HIGH (ref 70–99)
Potassium: 3.7 mmol/L (ref 3.5–5.1)
Sodium: 129 mmol/L — ABNORMAL LOW (ref 135–145)
Total Bilirubin: 1.3 mg/dL — ABNORMAL HIGH (ref 0.3–1.2)
Total Protein: 5.9 g/dL — ABNORMAL LOW (ref 6.5–8.1)

## 2023-04-21 LAB — CBC
Hemoglobin: 9.5 g/dL — ABNORMAL LOW (ref 13.0–17.0)
Platelets: 249 10*3/uL (ref 150–400)
RBC: 3.57 MIL/uL — ABNORMAL LOW (ref 4.22–5.81)
RDW: 18.7 % — ABNORMAL HIGH (ref 11.5–15.5)

## 2023-04-21 LAB — BLOOD CULTURE ID PANEL (REFLEXED) - BCID2

## 2023-04-21 LAB — BASIC METABOLIC PANEL
Anion gap: 10 (ref 5–15)
Anion gap: 11 (ref 5–15)
BUN: 26 mg/dL — ABNORMAL HIGH (ref 6–20)
BUN: 32 mg/dL — ABNORMAL HIGH (ref 6–20)
CO2: 20 mmol/L — ABNORMAL LOW (ref 22–32)
CO2: 19 mmol/L — ABNORMAL LOW (ref 22–32)
Calcium: 7.3 mg/dL — ABNORMAL LOW (ref 8.9–10.3)
Calcium: 7.5 mg/dL — ABNORMAL LOW (ref 8.9–10.3)
Chloride: 100 mmol/L (ref 98–111)
Chloride: 96 mmol/L — ABNORMAL LOW (ref 98–111)
Creatinine, Ser: 0.74 mg/dL (ref 0.61–1.24)
Creatinine, Ser: 0.75 mg/dL (ref 0.61–1.24)
GFR, Estimated: >60 mL/min (ref >60)
GFR, Estimated: >60 mL/min (ref >60)
Glucose, Bld: 141 mg/dL — ABNORMAL HIGH (ref 70–99)
Glucose, Bld: 200 mg/dL — ABNORMAL HIGH (ref 70–99)
Potassium: 3.1 mmol/L — ABNORMAL LOW (ref 3.5–5.1)
Potassium: 3.4 mmol/L — ABNORMAL LOW (ref 3.5–5.1)
Sodium: 130 mmol/L — ABNORMAL LOW (ref 135–145)
Sodium: 126 mmol/L — ABNORMAL LOW (ref 135–145)

## 2023-04-21 LAB — LACTIC ACID, PLASMA
Lactic Acid, Venous: 2.1 mmol/L (ref 0.5–1.9)
Lactic Acid, Venous: 2.3 mmol/L (ref 0.5–1.9)
Lactic Acid, Venous: 1.1 mmol/L (ref 0.5–1.9)

## 2023-04-21 LAB — CORTISOL: Cortisol, Plasma: 33.2 ug/dL

## 2023-04-21 LAB — T4, FREE: Free T4: 1.37 ng/dL — ABNORMAL HIGH (ref 0.61–1.12)

## 2023-04-21 LAB — PROCALCITONIN: Procalcitonin: 0.11 ng/mL

## 2023-04-21 LAB — GLUCOSE, CAPILLARY
Glucose-Capillary: 152 mg/dL — ABNORMAL HIGH (ref 70–99)
Glucose-Capillary: 155 mg/dL — ABNORMAL HIGH (ref 70–99)
Glucose-Capillary: 138 mg/dL — ABNORMAL HIGH (ref 70–99)

## 2023-04-21 LAB — SARS CORONAVIRUS 2 BY RT PCR: SARS Coronavirus 2 by RT PCR: NEGATIVE

## 2023-04-21 LAB — TSH: TSH: 9.134 u[IU]/mL — ABNORMAL HIGH (ref 0.350–4.500)

## 2023-04-21 LAB — HIV ANTIBODY (ROUTINE TESTING W REFLEX): HIV Screen 4th Generation wRfx: NONREACTIVE

## 2023-04-21 LAB — PROTIME-INR
INR: 1.5 — ABNORMAL HIGH (ref 0.8–1.2)
Prothrombin Time: 17.5 seconds — ABNORMAL HIGH (ref 11.4–15.2)

## 2023-04-21 LAB — CK: Total CK: 11 U/L — ABNORMAL LOW (ref 49–397)

## 2023-04-21 LAB — APTT: aPTT: 23 seconds — ABNORMAL LOW (ref 24–36)

## 2023-04-21 LAB — MAGNESIUM: Magnesium: 2.1 mg/dL (ref 1.7–2.4)

## 2023-04-21 LAB — CULTURE, BLOOD (ROUTINE X 2)

## 2023-04-21 LAB — PHOSPHORUS: Phosphorus: 3.2 mg/dL (ref 2.5–4.6)

## 2023-04-21 MED ORDER — LIDOCAINE HCL 1 % IJ SOLN
INTRAMUSCULAR | Status: AC
  Filled 2023-04-21: qty 10

## 2023-04-21 MED ORDER — DOCUSATE SODIUM 100 MG PO CAPS: 100.0000 mg | ORAL_CAPSULE | Freq: Two times a day (BID) | ORAL | Status: DC | PRN

## 2023-04-21 MED ORDER — PANTOPRAZOLE SODIUM 40 MG IV SOLR
40.0000 mg | INTRAVENOUS | Status: DC
  Administered 2023-04-21 – 2023-04-24 (×4): 40 mg via INTRAVENOUS
  Filled 2023-04-21 (×4): qty 10

## 2023-04-21 MED ORDER — LINEZOLID 600 MG/300ML IV SOLN
600.0000 mg | Freq: Two times a day (BID) | INTRAVENOUS | Status: DC
  Administered 2023-04-21 (×2): 600 mg via INTRAVENOUS
  Filled 2023-04-21 (×3): qty 300

## 2023-04-21 MED ORDER — LACTATED RINGERS IV BOLUS
1000.0000 mL | Freq: Once | INTRAVENOUS | Status: AC
  Administered 2023-04-21: 1000 mL via INTRAVENOUS

## 2023-04-21 MED ORDER — SODIUM CHLORIDE 0.9 % IV BOLUS
1000.0000 mL | Freq: Once | INTRAVENOUS | Status: AC
  Administered 2023-04-21: 1000 mL via INTRAVENOUS

## 2023-04-21 MED ORDER — POTASSIUM CHLORIDE 20 MEQ PO PACK
40.0000 meq | PACK | Freq: Two times a day (BID) | ORAL | Status: AC
  Administered 2023-04-21 (×2): 40 meq via ORAL
  Filled 2023-04-21 (×2): qty 2

## 2023-04-21 MED ORDER — PIPERACILLIN-TAZOBACTAM 3.375 G IVPB
3.3750 g | Freq: Three times a day (TID) | INTRAVENOUS | Status: AC
  Administered 2023-04-21 – 2023-04-22 (×4): 3.375 g via INTRAVENOUS
  Filled 2023-04-21 (×5): qty 50

## 2023-04-21 MED ORDER — PHENTOLAMINE MESYLATE 5 MG IJ SOLR
5.0000 mg | Freq: Once | INTRAMUSCULAR | Status: AC
  Administered 2023-04-21: 5 mg via SUBCUTANEOUS
  Filled 2023-04-21: qty 5

## 2023-04-21 MED ORDER — ALBUMIN HUMAN 25 % IV SOLN
12.5000 g | Freq: Once | INTRAVENOUS | Status: AC
  Administered 2023-04-21: 12.5 g via INTRAVENOUS
  Filled 2023-04-21: qty 50

## 2023-04-21 MED ORDER — NOREPINEPHRINE 16 MG/250ML-% IV SOLN
0.0000 ug/min | INTRAVENOUS | Status: DC
  Administered 2023-04-21: 10 ug/min via INTRAVENOUS
  Filled 2023-04-21 (×2): qty 250

## 2023-04-21 MED ORDER — HYDROCORTISONE SOD SUC (PF) 100 MG IJ SOLR
100.0000 mg | Freq: Three times a day (TID) | INTRAMUSCULAR | Status: DC
  Administered 2023-04-21 – 2023-04-25 (×13): 100 mg via INTRAVENOUS
  Filled 2023-04-21 (×15): qty 2

## 2023-04-21 MED ORDER — LIDOCAINE HCL 1 % IJ SOLN
5.0000 mL | Freq: Once | INTRAMUSCULAR | Status: AC
  Administered 2023-04-21: 5 mL via INTRADERMAL

## 2023-04-21 MED ORDER — VANCOMYCIN HCL 1500 MG/300ML IV SOLN
1500.0000 mg | Freq: Once | INTRAVENOUS | Status: AC
  Administered 2023-04-21: 1500 mg via INTRAVENOUS
  Filled 2023-04-21: qty 300

## 2023-04-21 MED ORDER — POLYETHYLENE GLYCOL 3350 17 G PO PACK: 17.0000 g | PACK | Freq: Every day | ORAL | Status: DC | PRN

## 2023-04-21 MED ORDER — ADULT MULTIVITAMIN W/MINERALS CH
1.0000 | ORAL_TABLET | Freq: Every day | ORAL | Status: DC
  Administered 2023-04-22: 1 via ORAL
  Filled 2023-04-21: qty 1

## 2023-04-21 MED ORDER — ENSURE MAX PROTEIN PO LIQD
11.0000 [oz_av] | Freq: Three times a day (TID) | ORAL | Status: DC
  Administered 2023-04-21: 11 [oz_av] via ORAL
  Filled 2023-04-21: qty 330

## 2023-04-21 MED ORDER — SODIUM CHLORIDE 0.9 % IV BOLUS (SEPSIS)
2000.0000 mL | Freq: Once | INTRAVENOUS | Status: AC
  Administered 2023-04-21: 2000 mL via INTRAVENOUS

## 2023-04-21 MED ORDER — VANCOMYCIN HCL IN DEXTROSE 1-5 GM/200ML-% IV SOLN
1000.0000 mg | Freq: Once | INTRAVENOUS | Status: AC
  Administered 2023-04-21: 1000 mg via INTRAVENOUS
  Filled 2023-04-21: qty 200

## 2023-04-21 MED ORDER — VASOPRESSIN 20 UNITS/100 ML INFUSION FOR SHOCK
0.0000 [IU]/min | INTRAVENOUS | Status: DC
  Filled 2023-04-21: qty 100

## 2023-04-21 MED ORDER — INSULIN ASPART 100 UNIT/ML IJ SOLN
0.0000 [IU] | INTRAMUSCULAR | Status: DC
  Administered 2023-04-21: 3 [IU] via SUBCUTANEOUS
  Administered 2023-04-22 (×2): 2 [IU] via SUBCUTANEOUS
  Filled 2023-04-21 (×3): qty 1

## 2023-04-21 MED ORDER — VITAMIN C 500 MG PO TABS
500.0000 mg | ORAL_TABLET | Freq: Two times a day (BID) | ORAL | Status: DC
  Administered 2023-04-22 – 2023-05-23 (×62): 500 mg via ORAL
  Filled 2023-04-21 (×62): qty 1

## 2023-04-21 MED ORDER — SODIUM CHLORIDE 0.9 % IV SOLN
2.0000 g | Freq: Once | INTRAVENOUS | Status: AC
  Administered 2023-04-21: 2 g via INTRAVENOUS
  Filled 2023-04-21: qty 12.5

## 2023-04-21 MED ORDER — STERILE WATER FOR INJECTION IJ SOLN
INTRAMUSCULAR | Status: AC
  Administered 2023-04-21: 1 mL
  Filled 2023-04-21: qty 10

## 2023-04-21 MED ORDER — NOREPINEPHRINE 4 MG/250ML-% IV SOLN
INTRAVENOUS | Status: AC
  Administered 2023-04-21: 2 ug/min via INTRAVENOUS
  Filled 2023-04-21: qty 250

## 2023-04-21 MED ORDER — VANCOMYCIN HCL 2000 MG/400ML IV SOLN
2000.0000 mg | Freq: Two times a day (BID) | INTRAVENOUS | Status: DC
  Filled 2023-04-21: qty 400

## 2023-04-21 MED ORDER — LEVOTHYROXINE SODIUM 100 MCG PO TABS
100.0000 ug | ORAL_TABLET | Freq: Every day | ORAL | Status: DC
  Administered 2023-04-22 – 2023-05-23 (×32): 100 ug via ORAL
  Filled 2023-04-21 (×32): qty 1

## 2023-04-21 MED ORDER — NOREPINEPHRINE 4 MG/250ML-% IV SOLN
0.0000 ug/min | INTRAVENOUS | Status: DC
  Administered 2023-04-21: 14 ug/min via INTRAVENOUS
  Filled 2023-04-21: qty 250

## 2023-04-21 MED ORDER — ENOXAPARIN SODIUM 100 MG/ML IJ SOSY
0.5000 mg/kg | PREFILLED_SYRINGE | INTRAMUSCULAR | Status: DC
  Administered 2023-04-21 – 2023-04-26 (×6): 90 mg via SUBCUTANEOUS
  Filled 2023-04-21 (×7): qty 1

## 2023-04-21 MED ORDER — LEVOTHYROXINE SODIUM 100 MCG/5ML IV SOLN
250.0000 ug | Freq: Once | INTRAVENOUS | Status: AC
  Administered 2023-04-21: 250 ug via INTRAVENOUS
  Filled 2023-04-21: qty 15

## 2023-04-21 MED ORDER — ZINC SULFATE 220 (50 ZN) MG PO CAPS
220.0000 mg | ORAL_CAPSULE | Freq: Every day | ORAL | Status: AC
  Administered 2023-04-22 – 2023-05-21 (×29): 220 mg via ORAL
  Filled 2023-04-21 (×29): qty 1

## 2023-04-21 NOTE — Plan of Care (Signed)
Consult noted for GOC. Patient is currently resting in bed with SLP at bedside working with patient. Will reattempt tomorrow.

## 2023-04-21 NOTE — ED Triage Notes (Signed)
Pt presents to ER via [facility] ems from home with c/o fatigue that has been ongoing for appx 2 days.  Pt reports recently losing lots of weight and has had a decreased appetite, and not been feeling well for last few days.  Pt states he lives at home with his mother.  Pt also reports having decreased use of his arms over last few days.  Pt is otherwise A&O x4 and in NAD on arrival.

## 2023-04-21 NOTE — Progress Notes (Signed)
Pharmacy Antibiotic Note  Christopher Herman is a 47 y.o. male admitted on 04/21/2023 with septic shock possibly due to UTI and sacral wound. Pharmacy has been consulted for Zosyn dosing.  Started on vancomycin. Given BMI > 60, switching to linezolid. Noted Feb 2023 Scr 0.39. Scr today 0.74.   Plan: Discontinue vancomycin. Start linezolid IV  every 12 hours  Continue Zosyn IV 3.375 grams every 8 hours (4 hour infusion)  Height:  (165.1 cm) Weight: (!) 185.4 kg (408 lb 11.7 oz) IBW/kg (Calculated) : 61.5  Temp (24hrs), Avg:97.5 F (36.4 C), Min:96.8 F (36 C), Max:98.4 F (36.9 C)  Recent Labs  Lab 04/21/23 0201 04/21/23 0426 04/21/23 0429  WBC 24.6* 27.5*  --   CREATININE 0.82 0.74  --   LATICACIDVEN 2.1*  --  2.3*     Estimated Creatinine Clearance: 183.2 mL/min (by C-G formula based on SCr of 0.74 mg/dL).    No Known Allergies  Antimicrobials this admission:  Vancomycin 4/22 >> 4/23   Piperacillin/tazobactam 4/23 >>  Linezolid 4/23 >>  Dose adjustments this admission: N/a  Microbiology results: 4/23 BCx: NG < 12hours   4/23 UA: pyuria   Thank you for allowing pharmacy to be a part of this patient's care.  Elliot Gurney, PharmD, BCPS Clinical Pharmacist  04/21/2023 12:51 PM

## 2023-04-21 NOTE — H&P (Addendum)
NAME:  Christopher Herman, MRN:  [id number], DOB:  1976/02/14, LOS: 0 ADMISSION DATE:  04/21/2023, CONSULTATION DATE: 04/21/2023 REFERRING MD: Pilar Jarvis, CHIEF COMPLAINT: Generalized weakness   HPI  47 y.o male with significant PMH of severe obesity s/p bariatric surgery, OSA, hypoventilation syndrome, HTN, GERD, cholelithiasis, bilateral extremity lipoma, chronic lymphedema status post IVC filter placement, IDA, who presented to the ED with chief complaints of generalized weakness, extreme fatigue, unintended weight loss, disuse of upper extremities and poor p.o. intake times few days   ED Course: Initial vital signs showed HR of 145 beats/minute, BP 86/66 mm Hg, the RR 22 breaths/minute, and the oxygen saturation 100% on and a temperature of 97.79F (36.4C).   Pertinent Labs/Diagnostics Findings: Na+/ K+:129/3.7 Glucose: 128 BUN/Cr.:30/0.82 WBC:24.6 Hgb/Hct: 10.4/33.2 Plts: 541 TSH:9.134, Free T4:1.37 PCT: pending Lactic acid:2.1 COVID PCR: Negative CXR> no active cardiopulmonary disease  Patient given 30 cc/kg of fluids and started on broad-spectrum antibiotics Vanco cefepime and Flagyl for suspected sepsis of unknown origin with septic shock. Patient remained hypotensive despite IVF boluses therefore was started on Levophed.  (Sepsis reassessment completed). PCCM consulted.  Past Medical History  severe obesity s/p bariatric surgery, OSA, hypoventilation syndrome, HTN, GERD, cholelithiasis, bilateral extremity lipoma, chronic lymphedema status post IVC filter placement, IDA,  Significant Hospital Events   4/23:Admit to ICU with sepsis of unknown source and severe hypothyrodism  Consults:  None  Procedures:  None  Significant Diagnostic Tests:  4/23: Chest Xray> no active cardiopulmonary process  Interim History / Subjective:  -  Micro Data:  4/23: SARS-CoV-2 PCR> negative 4/23: Influenza PCR> negative 4/23: Blood culture x2> 4/23: Urine Culture> 4/23: MRSA PCR>>    Antimicrobials:  Vancomycin 4/23 > Cefepime 4/23> Metronidazole 4/23>  OBJECTIVE  Blood pressure (!) 95/51, pulse (!) 116, temperature (!) 97 F (36.1 C), resp. rate 16, weight (!) 180.1 kg, SpO2 99 %.        Intake/Output Summary (Last 24 hours) at 04/21/2023 0432 Last data filed at 04/21/2023 0343 Gross per 24 hour  Intake 2013.7 ml  Output --  Net 2013.7 ml   Filed Weights   04/21/23 0152  Weight: (!) 180.1 kg   Physical Examination  GENERAL:  year-old critically ill patient lying in the bed lethargic appearing EYES: PEERLA. No scleral icterus. Extraocular muscles intact.  HEENT: Head atraumatic, normocephalic. Oropharynx and nasopharynx clear.  NECK:  No JVD, supple  LUNGS: Normal breath sounds bilaterally.  No use of accessory muscles of respiration.  CARDIOVASCULAR: S1, S2 normal. No murmurs, rubs, or gallops.  ABDOMEN: Soft, NTND EXTREMITIES: Generalized body edema with severe bilateral lower extremity weeping lymphedema, bullae. Capillary refill is less than 3 seconds in all extremities. Pulses palpable distally. NEUROLOGIC: The patient is alert and oriented x 3.  No focal neurological deficit appreciated.  Difficulty moving bilateral upper and lower extremities against gravity.  Cranial nerves are intact.  SKIN: See below Labs/imaging that I havepersonally reviewed  (right click and "Reselect all SmartList Selections" daily)     Labs   CBC: Recent Labs  Lab 04/21/23 0201  WBC 24.6*  NEUTROABS 20.6*  HGB 10.4*  HCT 33.2*  MCV 85.3  PLT 541*    Basic Metabolic Panel: Recent Labs  Lab 04/21/23 0201  NA 129*  K 3.7  CL 96*  CO2 22  GLUCOSE 128*  BUN 30*  CREATININE 0.82  CALCIUM 7.9*   GFR: CrCl cannot be calculated (Unknown ideal weight.). Recent Labs  Lab 04/21/23 0201  WBC 24.6*  LATICACIDVEN 2.1*    Liver Function Tests: Recent Labs  Lab 04/21/23 0201  AST 16  ALT 11  ALKPHOS 118  BILITOT 1.3*  PROT 5.9*  ALBUMIN 1.9*   No  results for input(s): "LIPASE", "AMYLASE" in the last 168 hours. No results for input(s): "AMMONIA" in the last 168 hours.  ABG    Component Value Date/Time   HCO3 13.6 (L) 02/10/2022 1936   ACIDBASEDEF 10.9 (H) 02/10/2022 1936   O2SAT 78.0 02/10/2022 1936     Coagulation Profile: No results for input(s): "INR", "PROTIME" in the last 168 hours.  Cardiac Enzymes: No results for input(s): "CKTOTAL", "CKMB", "CKMBINDEX", "TROPONINI" in the last 168 hours.  HbA1C: Hemoglobin A1C  Date/Time Value Ref Range Status  02/11/2012 12:27 PM 5.8 4.2 - 6.3 % Final    Comment:    The American Diabetes Association recommends that a primary goal of therapy should be <7% and that physicians should reevaluate the treatment regimen in patients with HbA1c values consistently >8%.     CBG: No results for input(s): "GLUCAP" in the last 168 hours.  Review of Systems:   Review of Systems  Constitutional:  Positive for chills, diaphoresis, malaise/fatigue and weight loss.  HENT: Negative.    Eyes: Negative.   Respiratory:  Negative for cough, hemoptysis, sputum production, shortness of breath and wheezing.   Cardiovascular:  Positive for leg swelling and PND.  Gastrointestinal:  Positive for nausea. Negative for abdominal pain, diarrhea, heartburn and vomiting.  Genitourinary:  Positive for frequency.  Musculoskeletal:  Positive for myalgias.  Skin:  Positive for rash.  Neurological:  Positive for weakness.  Endo/Heme/Allergies: Negative.   Psychiatric/Behavioral: Negative.     Past Medical History  He,  has a past medical history of AKI (acute kidney injury), Cholelithiasis, Chronic acquired lymphedema, GERD (gastroesophageal reflux disease), Helicobacter pylori infection (09/10/2015), History of sepsis, Leg muscle spasm, Leukocytosis, Lipoma of both lower extremities, Palpitations (05/31/2009), Presence of IVC filter, Rhabdomyolysis, Sleep apnea, Tachycardia, and Vitamin D deficiency.    Surgical History    Past Surgical History:  Procedure Laterality Date   GASTRIC BYPASS  05/23/2012     Social History   reports that he has quit smoking. He has never used smokeless tobacco. He reports that he does not drink alcohol and does not use drugs.   Family History   His family history includes Cancer in his paternal grandfather and paternal grandmother.   Allergies No Known Allergies   Home Medications  Prior to Admission medications   Medication Sig Start Date End Date Taking? Authorizing Provider  Cholecalciferol (VITAMIN D3) 50 MCG (2000 UT) capsule Take 1 capsule (2,000 Units total) by mouth daily. Patient not taking: Reported on 10/15/2022 02/21/22   Malva Limes, MD  cyclobenzaprine (FLEXERIL) 5 MG tablet Take 1 tablet (5 mg total) by mouth 3 (three) times daily as needed for muscle spasms. Patient not taking: Reported on 10/15/2022 02/20/22   Merita Norton T, FNP  doxycycline (VIBRA-TABS) 100 MG tablet TAKE 1 TABLET(100 MG) BY MOUTH TWICE DAILY FOR 10 DAYS Patient not taking: Reported on 04/21/2023 01/01/23   Malva Limes, MD  furosemide (LASIX) 40 MG tablet Take 1 tablet (40 mg total) by mouth daily as needed. Take one tablet daily Patient not taking: Reported on 04/21/2023 03/24/22   Malva Limes, MD  Multiple Vitamins-Minerals (MULTIVITAMIN WITH MINERALS) tablet Take 1 tablet by mouth daily. Patient not taking: Reported on 04/21/2023 02/20/22   Merita Norton  T, FNP  Semaglutide-Weight Management (WEGOVY) 0.25 MG/0.5ML SOAJ Inject 0.25 mg into the skin once a week. Increase to 0.5 mg once a week starting with 5th dose Patient not taking: Reported on 10/15/2022 02/21/22   Malva Limes, MD  silver sulfADIAZINE (SILVADENE) 1 % cream APPLY TOPICALLY TO THE AFFECTED AREA DAILY Patient not taking: Reported on 04/21/2023 11/02/22   Malva Limes, MD  simethicone (GAS-X) 80 MG chewable tablet Chew 1 tablet (80 mg total) by mouth 4 (four) times daily. Patient not  taking: Reported on 04/21/2023 07/11/22   Jacky Kindle, FNP    Scheduled Meds:  enoxaparin (LOVENOX) injection  0.5 mg/kg Subcutaneous Q24H   hydrocortisone sod succinate (SOLU-CORTEF) inj  100 mg Intravenous Q8H   [START ON 04/22/2023] levothyroxine  100 mcg Oral Q0600   Continuous Infusions:  albumin human     norepinephrine (LEVOPHED) Adult infusion 10 mcg/min (04/21/23 0343)   vancomycin 1,500 mg (04/21/23 0444)   PRN Meds:.docusate sodium, polyethylene glycol   Active Hospital Problem list     Assessment & Plan:   #Sepsis unknown etiology ?Septic Shock Initial interventions/workup included: 3 L of NS/LR & Cefepime/ Vancomycin/ Metronidazole meets SIRS criteria: Heart Rate 145 beats/minute, Respiratory Rate 22 breaths/minute,Temperature 36.1 -Supplemental oxygen as needed, to maintain SpO2 > 90% -F/u cultures, trend lactic/ PCT -Monitor WBC/ fever curve -Continue broad-spectrum antibiotics -IVF hydration as needed -Pressors for MAP goal >65 -Strict I/O's   #Severe Hypothyroidism: acute on Chronic? Last TSH, Free T4 check was 11 years ago with normal TSH Presents w/ generalized weakness, hyponatremia, hypothermia, hypotension with vasopressor-refractory shock TSH: 9.134 Free T4:1.34 -Will give loading dose IV Synthroid 250 mcg followed by 75-100 mcg daily -Start hydrocortisone 100 mg IV every 8 for possible concomitant adrenal insufficiency -Passive rewarming -Pressors to keep MAP goal greater than 65  #Chronic Leukocytosis -Unclear etiology has been worked up in the past for about -Sepsis workup as above -Continue to follow CBC -Once stable we will consider CT abdomen pelvis to evaluate for intra-abdominal source for high WBC -ID consult if appropriate  #Hyponatremia No signs of CHF / liver disease / renal failure.  TSH up. Will get am cortisol.  -Check CK -Continue IVFs with NS  -Follow BMP -treat hypothyroidism as above  #IDA #Folate  Deficiency #Hypoalbuminemia History of extreme obesity status post gastric bypass now with severe malnutrition in the setting of extreme weight loss -Albumin as needed -Check anemia panel -Daily Thiamine, Folate, MVI once tolerating PO -Dietary consult -PT/OT evaluation for mobility   Best practice:  Diet:  Oral Pain/Anxiety/Delirium protocol (if indicated): No VAP protocol (if indicated): Not indicated DVT prophylaxis: LMWH GI prophylaxis: N/A Glucose control:  SSI No Central venous access:  N/A Arterial line:  N/A Foley:  N/A Mobility:  bed rest  PT consulted: N/A Last date of multidisciplinary goals of care discussion [4/23] Code Status:  full code Disposition: ICU   = Goals of Care = Code Status Order: FULL  Primary Emergency Contact: Pettiford,Gwen, Home Phone: [phone] Wishes to pursue full aggressive treatment and intervention options, including CPR and intubation, but goals of care will be   Critical care time: 45 minutes        Webb Silversmith DNP, CCRN, FNP-C, AGACNP-BC Acute Care & Family Nurse Practitioner [facility] PCCM on call pager [phone]

## 2023-04-21 NOTE — Progress Notes (Signed)
PHARMACY CONSULT NOTE  Pharmacy Consult for Electrolyte Monitoring and Replacement   Recent Labs: Potassium (mmol/L)  Date Value  04/21/2023 3.1 (L)  02/11/2012 3.8   Magnesium (mg/dL)  Date Value  16/09/9603 2.1   Calcium (mg/dL)  Date Value  54/08/8118 7.3 (L)   Calcium, Total (mg/dL)  Date Value  14/78/2956 8.7   Albumin (g/dL)  Date Value  21/30/8657 1.9 (L)  02/11/2012 3.3 (L)   Phosphorus (mg/dL)  Date Value  84/69/6295 3.2   Sodium (mmol/L)  Date Value  04/21/2023 130 (L)  02/11/2012 140     Assessment: 47 year old male admitted to CCU with septic shock and severe hypothyroidism. PMH includes severe obesity s/p bariatric surgery, OSA, hypoventilation syndrome, HTN, GERD, cholelithiasis, bilateral extremity lipoma, chronic lymphedema status post IVC filter placement, IDA.  Diet: thin fluids Pressors: norepinephrine  mcg/min Antibiotics: Vancomycin, piperacillin/tazobactam Steroids: Solucortef  every 8 hours   Goal of Therapy:  Electrolytes within normal limits  Plan:  K+ 3.1. Will order Kcl PO packets 40 meq BID x 2 doses. Na 129 >130. Hyponatremia management per primary team Follow up labs tomorrow AM   Elliot Gurney, PharmD, BCPS Clinical Pharmacist  04/21/2023 7:57 AM]

## 2023-04-21 NOTE — Progress Notes (Signed)
Notified Zada Girt NP and pharmacy that pt's IV on right arm has infiltrated where levophed was running.... Central line will be placed.... stopped levo and placed to pt's left PIV temp until central line can be accessed.

## 2023-04-21 NOTE — Progress Notes (Signed)
PHARMACY -  BRIEF ANTIBIOTIC NOTE   Pharmacy has received consult(s) for Vancomycin from an ED provider.  The patient's profile has been reviewed for ht/wt/allergies/indication/available labs.    One time order(s) placed for Vancomycin 2500 mg IV X 1.   Further antibiotics/pharmacy consults should be ordered by admitting physician if indicated.                       Thank you, Tyreka Henneke D 04/21/2023  2:11 AM

## 2023-04-21 NOTE — Procedures (Signed)
Central Venous Catheter Insertion Procedure Note  Christopher Herman  [id number]  1976-07-13  Date:04/21/23  Time:10:45 AM   Provider Performing:Aaren Krog Earnest Conroy   Procedure: Insertion of Non-tunneled Central Venous [id number]) with US guidance ([id number])   Indication(s) Medication administration and Difficult access  Consent Risks of the procedure as well as the alternatives and risks of each were explained to the patient and/or caregiver.  Consent for the procedure was obtained and is signed in the bedside chart  Anesthesia Topical only with 1% lidocaine   Timeout Verified patient identification, verified procedure, site/side was marked, verified correct patient position, special equipment/implants available, medications/allergies/relevant history reviewed, required imaging and test results available.  Sterile Technique Maximal sterile technique including full sterile barrier drape, hand hygiene, sterile gown, sterile gloves, mask, hair covering, sterile ultrasound probe cover (if used).  Procedure Description Area of catheter insertion was cleaned with chlorhexidine and draped in sterile fashion.  With real-time ultrasound guidance a central venous catheter was placed into the right internal jugular vein. Nonpulsatile blood flow and easy flushing noted in all ports.  The catheter was sutured in place and sterile dressing applied.  Complications/Tolerance None; patient tolerated the procedure well. Chest X-ray is ordered to verify placement for internal jugular or subclavian cannulation.   Chest x-ray is not ordered for femoral cannulation.  EBL Minimal  Specimen(s) None  Zada Girt, AGNP  Pulmonary/Critical Care Pager [phone] (please enter 7 digits) PCCM Consult Pager [phone] (please enter 7 digits)

## 2023-04-21 NOTE — Progress Notes (Signed)
Chest x-ray obtained.... Zada Girt NP reviewed films and gave VO to use central line.

## 2023-04-21 NOTE — ED Provider Notes (Addendum)
[facility] Provider Note    Event Date/Time   First MD Initiated Contact with Patient 04/21/23 0149     (approximate)   History   Weakness   HPI  Christopher Herman is a 47 y.o. male   Past medical history of bedbound status, markedly elevated BMI, IVC filter in place, who presents to the emergency department with generalized weakness.  Has been going on for several days.  He denies any focal infectious symptoms or pain and specifically denies chest pain, cough, fever or chills, abdominal pain, nausea vomiting or diarrhea, dysuria.  Aside from fatigue and generalized weakness he has no other acute or focal complaints.  He is tachycardic and hypotensive on arrival but looks comfortable and is answering all questions appropriately.    External Medical Documents Reviewed: Family medicine video visit dated October 2023 that addressed his bedbound status where the patient stated he is physically unable to leave his home due to chronic weakness, and they also addressed cellulitis of the left lower extremity and edema due to malnutrition      Physical Exam   Triage Vital Signs: ED Triage Vitals  Enc Vitals Group     BP      Pulse      Resp      Temp      Temp src      SpO2      Weight      Height      Head Circumference      Peak Flow      Pain Score      Pain Loc      Pain Edu?      Excl. in GC?     Most recent vital signs: Vitals:   04/21/23 0335 04/21/23 0340  BP: (!) 79/60 98/70  Pulse: (!) 107 93  Resp: 15 14  Temp: (!) 97.4 F (36.3 C) (!) 97.4 F (36.3 C)  SpO2: 97% 99%    General: Awake, no distress.  CV:  Good peripheral perfusion.  Resp:  Normal effort.  Abd:  No distention.  Other:  He has an ulceration to the right side of his ankle with obvious cellulitic changes surrounding.  His abdomen is soft and nontender.  His lungs are clear.  His neck is supple with full range of motion.  The remainder of his skin exam shows no  obvious cellulitic changes & the perineum does not appear to be infected and he shows no signs of meningismus.   ED Results / Procedures / Treatments   Labs (all labs ordered are listed, but only abnormal results are displayed) Labs Reviewed  LACTIC ACID, PLASMA - Abnormal; Notable for the following components:      Result Value   Lactic Acid, Venous 2.1 (*)    All other components within normal limits  COMPREHENSIVE METABOLIC PANEL - Abnormal; Notable for the following components:   Sodium 129 (*)    Chloride 96 (*)    Glucose, Bld 128 (*)    BUN 30 (*)    Calcium 7.9 (*)    Total Protein 5.9 (*)    Albumin 1.9 (*)    Total Bilirubin 1.3 (*)    All other components within normal limits  CBC WITH DIFFERENTIAL/PLATELET - Abnormal; Notable for the following components:   WBC 24.6 (*)    RBC 3.89 (*)    Hemoglobin 10.4 (*)    HCT 33.2 (*)    RDW 18.6 (*)  Platelets 541 (*)    Neutro Abs 20.6 (*)    Monocytes Absolute 1.8 (*)    Abs Immature Granulocytes 0.16 (*)    All other components within normal limits  URINALYSIS, W/ REFLEX TO CULTURE (INFECTION SUSPECTED) - Abnormal; Notable for the following components:   Color, Urine YELLOW (*)    APPearance CLOUDY (*)    Hgb urine dipstick SMALL (*)    Bilirubin Urine SMALL (*)    Ketones, ur 5 (*)    Protein, ur 100 (*)    Leukocytes,Ua TRACE (*)    Bacteria, UA FEW (*)    Non Squamous Epithelial PRESENT (*)    All other components within normal limits  TSH - Abnormal; Notable for the following components:   TSH 9.134 (*)    All other components within normal limits  T4, FREE - Abnormal; Notable for the following components:   Free T4 1.37 (*)    All other components within normal limits  SARS CORONAVIRUS 2 BY RT PCR  CULTURE, BLOOD (ROUTINE X 2)  CULTURE, BLOOD (ROUTINE X 2)  LACTIC ACID, PLASMA  PROTIME-INR  APTT  TROPONIN I (HIGH SENSITIVITY)  TROPONIN I (HIGH SENSITIVITY)     I ordered and reviewed the above  labs they are notable for wbc 24+ EKG  ED ECG REPORT I, Pilar Jarvis, the attending physician, personally viewed and interpreted this ECG.   Date: 04/21/2023  EKG Time: 0158  Rate: 143  Rhythm: sinus tachycardia  Axis: nl  Intervals:none  ST&T Change: no stemi    RADIOLOGY I independently reviewed and interpreted Cxr and see no obvious opacity   PROCEDURES:  Critical Care performed: Yes, see critical care procedure note(s)  .Critical Care  Performed by: Pilar Jarvis, MD Authorized by: Pilar Jarvis, MD   Critical care provider statement:    Critical care time (minutes):  30   Critical care was time spent personally by me on the following activities:  Development of treatment plan with patient or surrogate, discussions with consultants, evaluation of patient's response to treatment, examination of patient, ordering and review of laboratory studies, ordering and review of radiographic studies, ordering and performing treatments and interventions, pulse oximetry, re-evaluation of patient's condition and review of old charts    MEDICATIONS ORDERED IN ED: Medications  vancomycin (VANCOCIN) IVPB 1000 mg/200 mL premix (1,000 mg Intravenous New Bag/Given 04/21/23 0254)    Followed by  vancomycin (VANCOREADY) IVPB 1500 mg/300 mL (has no administration in time range)  norepinephrine (LEVOPHED) 4mg  in (0.016 mg/mL) premix infusion (10 mcg/min Intravenous Infusion Verify 04/21/23 0329)  sodium chloride 0.9 % bolus 2,000 mL (0 mLs Intravenous Stopped 04/21/23 0333)  ceFEPIme (MAXIPIME) 2 g in sodium chloride 0.9 % 100 mL IVPB (0 g Intravenous Stopped 04/21/23 0333)  sodium chloride 0.9 % bolus 1,000 mL (1,000 mLs Intravenous New Bag/Given 04/21/23 1610)    External physician / consultants:  I spoke with hospitalist for admission and regarding care plan for this patient.   IMPRESSION / MDM / ASSESSMENT AND PLAN / ED COURSE  I reviewed the triage vital signs and the nursing notes.                                 Patient's presentation is most consistent with acute presentation with potential threat to life or bodily function.  Differential diagnosis includes, but is not limited to, sepsis, dehydration, metabolic derangement, osteomyelitis/cellulitis, urinary tract  infection, pneumonia   The patient is on the cardiac monitor to evaluate for evidence of arrhythmia and/or significant heart rate changes.  MDM: Concern for sepsis given fatigue/generalized weakness with hypotension, tachycardia and tachypnea.  No obvious source given no focal infectious complaints though he does have an ulceration to the lateral ankle which may represent his source of infection.  X-ray to assess for any bony involvement.  Vanco and cefepime ordered as broad-spectrum antibiotic coverage for concern of sepsis.  30 cc per ideal body weight crystalloids ordered.  He will require admission.   --  Sepsis reassessment-the patient had 2 L of crystalloid bolus but remains refractory hypotensive.  ICU consulted after the patient's hypotension had not changed after 2 full liters of crystalloid, and now started on peripheral Levophed.  Labs have started to result and giving more information with evidence of hypothyroidism, ICU consulted here in the emergency department with me and is ordering Synthroid and stress dose steroids.        FINAL CLINICAL IMPRESSION(S) / ED DIAGNOSES   Final diagnoses:  Generalized weakness  Sepsis, due to unspecified organism, unspecified whether acute organ dysfunction present  Lower limb ulcer, ankle, right, with unspecified severity  Hypothyroidism, unspecified type     Rx / DC Orders   ED Discharge Orders     None        Note:  This document was prepared using Dragon voice recognition software and may include unintentional dictation errors.    Pilar Jarvis, MD 04/21/23 1610    Pilar Jarvis, MD 04/21/23 9604    Pilar Jarvis, MD 04/21/23 5409     Pilar Jarvis, MD 04/21/23 [phone]

## 2023-04-21 NOTE — Evaluation (Signed)
Clinical/Bedside Swallow Evaluation Patient Details  Name: Christopher Herman MRN: [id number] Date of Birth: Jun 08, 1976  Today's Date: 04/21/2023 Time: SLP Start Time (ACUTE ONLY): 1550 SLP Stop Time (ACUTE ONLY): 1640 SLP Time Calculation (min) (ACUTE ONLY): 50 min  Past Medical History:  Past Medical History:  Diagnosis Date   AKI (acute kidney injury)    Cholelithiasis    Chronic acquired lymphedema    GERD (gastroesophageal reflux disease)    Helicobacter pylori infection 09/10/2015   History of sepsis    Leg muscle spasm    Leukocytosis    Lipoma of both lower extremities    Palpitations 05/31/2009   Chronic, stable   Presence of IVC filter    Rhabdomyolysis    Sleep apnea    Tachycardia    Vitamin D deficiency    Past Surgical History:  Past Surgical History:  Procedure Laterality Date   GASTRIC BYPASS  05/23/2012   HPI:  Pt is a 47 y.o male with significant PMH of severe obesity s/p bariatric surgery in 2013, OSA, hypoventilation syndrome, HTN, GERD, cholelithiasis, bilateral extremity lipoma, chronic lymphedema status post IVC filter placement, IDA.  Pt presents to ER via PTAR ems from home with c/o fatigue, weakness that has been ongoing for several days.  Pt reports recently losing increased weight and has had a decreased appetite, and not been feeling well for last few days.  Pt states he lives at home with his mother.  Pt also reports having decreased use of his arms over last few days. He does not walk.  Pt is otherwise A&O x4.   CXRs x2: No active disease. Pt has not had admits for pneumonia nor cxr imaging per chart.    Assessment / Plan / Recommendation  Clinical Impression   Pt seen today for BSE. Pt awake, alert x4. Verbally communicated w/ this SLP and followed instructions. A/Ox4. Mother present. Pt appeared weak, deconditioned overall w/ low volume of speech at Baseline. Pt and Mother reported pt was "very weak" and that he has been unable to use his arms  recently; bedbound and not walking w/ legs weeping currently, per NSG.  Pt endorsed he felt he has had swallowing "issues" since 2008; and in 2013 when he had Gastric Bypass Surgery. 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 Pt stated there are certain foods "fruits and vegetables" that he can eat "ok"; he has to be "careful" drinking liquids -- lying in bed and using a straw since he is unable to feed himself. He has recently become a Dependent Feeder which can increase risk for aspiration/choking in pts.  Pt is on RA and afebrile.    Pt appears to present w/ grossly functional oropharyngeal phase swallow w/ No overt oropharyngeal phase dysphagia noted w/ the few trials consumed; no overt neuromuscular deficits noted. Pt consumed po trials w/ No immediate, overt clinical s/s of aspiration during po trials. Pt appears at reduced risk for aspiration following general aspiration AND REFLUX precautions.   However, pt does have challenging factors that could impact oropharyngeal swallowing to include SEVERELY deconditioned body/State, fatigue/weakness, Dependent Feeder status w/ inability to feed self, and baseline of Gastric Bypass Surgery w/ potential for Esophageal phase Dysmotility which could impact the pharyngeal phase of swallowing. These factors can increase risk for aspiration, dysphagia as well as decreased oral intake overall.   During po trials, pt consumed consistencies of thin liquids and purees (unable to test soft  solids d/t medical care w/ NP/NSG ongoing) w/ no overt coughing, decline in vocal quality, or change in respiratory presentation during/post trials. O2 sats remained 100%.  Oral phase appeared [facility] w/ timely bolus management and control of bolus propulsion for A-P transfer for swallowing. Oral clearing achieved w/ trial consistencies.  OM Exam appeared [facility] w/ no unilateral weakness noted. Speech Clear; Low volume d/t  overall weakness and deconditioning.   Recommend continue a Regular consistency diet(for options/choices) but w/ well-Cut meats/foods, moistened foods; Thin liquids -- carefully monitor straw use for small, single sips Slowly. Pt should help to hold cup when drinking when able to again to reduce risk for aspiration. Recommend general aspiration precautions. recommend STRICT REFLUX PRECAUTIONS. Pills WHOLE in Puree for safer, easier swallowing -- as needed per NSG assessment. Pt has done this in the past, and it is encourged now and for D/C in setting of severe deconditioning.   Education given on Pills in Puree; food consistencies and easy to eat options; general aspiration precautions to pt and Dtr. NSG updated, agreed. NP updated. Recommend Dietician f/u for support. 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 Pt should help to hold cup when drinking when able to again to reduce risk for aspiration. Recommend general aspiration precautions. recommend STRICT REFLUX PRECAUTIONS.   Medication Administration: Whole meds with puree (if needed for ease and safety of swallowing)    Other  Recommendations Recommended Consults: Consider GI evaluation;Consider esophageal assessment (Dietician f/u: palliative care f/u d/t other medical issues) Oral Care Recommendations: Oral care BID;Oral care before and after PO;Staff/trained caregiver to provide oral care (weak UEs)    Recommendations for follow up therapy are one component of a multi-disciplinary discharge planning process, led by  the attending physician.  Recommendations may be updated based on patient status, additional functional criteria and insurance authorization.  Follow up Recommendations Follow physician's recommendations for discharge plan and follow up therapies      Assistance Recommended at Discharge  Full  Functional Status Assessment Patient has had a recent decline in their functional status and/or demonstrates limited ability to make significant improvements in function in a reasonable and predictable amount of time  Frequency and Duration min 1 x/week  1 week       Prognosis Prognosis for improved oropharyngeal function: Fair Barriers to Reach Goals: Time post onset;Severity of deficits Barriers/Prognosis Comment: gastric bypass surgery; deconditioned and bedbound.      Swallow Study   General Date of Onset: 04/21/23 HPI: Pt is a 47 y.o male with significant PMH of severe obesity s/p bariatric surgery in 2013, OSA, hypoventilation syndrome, HTN, GERD, cholelithiasis, bilateral extremity lipoma, chronic lymphedema status post IVC filter placement, IDA.  Pt presents to ER via PTAR ems from home with c/o fatigue, weakness that has been ongoing for several days.  Pt reports recently losing increased weight and has had a decreased appetite, and not been feeling well for last few days.  Pt states he lives at home with his mother.  Pt also reports having decreased use of his arms over last few days. He does not walk.  Pt is otherwise A&O x4.   CXRs x2: No  active disease. Pt has not had admits for pneumonia nor cxr imaging per chart. Type of Study: Bedside Swallow Evaluation Previous Swallow Assessment: none Diet Prior to this Study: Regular;Thin liquids (Level 0) Temperature Spikes Noted: No Respiratory Status: Room air History of Recent Intubation: No Behavior/Cognition: Alert;Cooperative;Pleasant mood Oral Cavity Assessment: Within Functional Limits Oral Care Completed by SLP: Recent completion by  staff Oral Cavity - Dentition: Adequate natural dentition Vision:  (n/a) Self-Feeding Abilities: Total assist (unable to use UEs d/t weakness) Patient Positioning: Upright in bed (needed full positioning) Baseline Vocal Quality: Low vocal intensity (deconditioned) Volitional Cough:  (Fair-Good) Volitional Swallow: Able to elicit    Oral/Motor/Sensory Function Overall Oral Motor/Sensory Function: Within functional limits (bolus management, symmetry/ROM WFL.)   Ice Chips Ice chips: Within functional limits Presentation: Spoon (fed; 2 trials)   Thin Liquid Thin Liquid: Within functional limits Presentation: Straw (5 trials) Other Comments: single sips; felt need to belch after - seemed to cause min disorganized completion of swallow x2    Nectar Thick Nectar Thick Liquid: Not tested   Honey Thick Honey Thick Liquid: Not tested   Puree Puree: Within functional limits Presentation: Spoon (fed; 2 trials)   Solid     Solid: Not tested Other Comments: NP arrived for assessment        Jerilynn Som, MS, CCC-SLP Speech Language Pathologist Rehab Services; [facility] [phone] (ascom) Khilee Hendricksen 04/21/2023,5:16 PM

## 2023-04-21 NOTE — Progress Notes (Signed)
PHARMACY - PHYSICIAN COMMUNICATION CRITICAL VALUE ALERT - BLOOD CULTURE IDENTIFICATION (BCID)  Christopher Herman is an 47 y.o. male who presented to [facility] on 04/21/2023 with a chief complaint of hypothyroidism, septic shock.   Assessment:  Staph epi in 1 of 4 bottles, Mec A +,  most likely a contaminant.  (include suspected source if known)  Name of physician (or Provider) Contacted: Cheryll Cockayne Rust-Chester, NP   Current antibiotics: Zyvox , Zosyn   Changes to prescribed antibiotics recommended:  Patient is on recommended antibiotics - No changes needed  Results for orders placed or performed during the hospital encounter of 04/21/23  Blood Culture ID Panel (Reflexed) (Collected: 04/21/2023  2:36 AM)  Result Value Ref Range   Enterococcus faecalis NOT DETECTED NOT DETECTED   Enterococcus Faecium NOT DETECTED NOT DETECTED   Listeria monocytogenes NOT DETECTED NOT DETECTED   Staphylococcus species DETECTED (A) NOT DETECTED   Staphylococcus aureus (BCID) NOT DETECTED NOT DETECTED   Staphylococcus epidermidis DETECTED (A) NOT DETECTED   Staphylococcus lugdunensis NOT DETECTED NOT DETECTED   Streptococcus species NOT DETECTED NOT DETECTED   Streptococcus agalactiae NOT DETECTED NOT DETECTED   Streptococcus pneumoniae NOT DETECTED NOT DETECTED   Streptococcus pyogenes NOT DETECTED NOT DETECTED   A.calcoaceticus-baumannii NOT DETECTED NOT DETECTED   Bacteroides fragilis NOT DETECTED NOT DETECTED   Enterobacterales NOT DETECTED NOT DETECTED   Enterobacter cloacae complex NOT DETECTED NOT DETECTED   Escherichia coli NOT DETECTED NOT DETECTED   Klebsiella aerogenes NOT DETECTED NOT DETECTED   Klebsiella oxytoca NOT DETECTED NOT DETECTED   Klebsiella pneumoniae NOT DETECTED NOT DETECTED   Proteus species NOT DETECTED NOT DETECTED   Salmonella species NOT DETECTED NOT DETECTED   Serratia marcescens NOT DETECTED NOT DETECTED   Haemophilus influenzae NOT DETECTED NOT DETECTED   Neisseria  meningitidis NOT DETECTED NOT DETECTED   Pseudomonas aeruginosa NOT DETECTED NOT DETECTED   Stenotrophomonas maltophilia NOT DETECTED NOT DETECTED   Candida albicans NOT DETECTED NOT DETECTED   Candida auris NOT DETECTED NOT DETECTED   Candida glabrata NOT DETECTED NOT DETECTED   Candida krusei NOT DETECTED NOT DETECTED   Candida parapsilosis NOT DETECTED NOT DETECTED   Candida tropicalis NOT DETECTED NOT DETECTED   Cryptococcus neoformans/gattii NOT DETECTED NOT DETECTED   Methicillin resistance mecA/C DETECTED (A) NOT DETECTED    [name] 04/21/2023  10:56 PM

## 2023-04-21 NOTE — Progress Notes (Signed)
Anticoagulation monitoring(Lovenox):  47 yo male ordered Lovenox 40 mg Q24h    Filed Weights   04/21/23 0152 04/21/23 0410  Weight: (!) 180.1 kg (397 lb 0.8 oz) (!) 185.4 kg (408 lb 11.7 oz)   BMI 68    Lab Results  Component Value Date   CREATININE 0.82 04/21/2023   CREATININE 0.61 02/17/2022   CREATININE 0.39 (L) 02/16/2022   Estimated Creatinine Clearance: 178.8 mL/min (by C-G formula based on SCr of 0.82 mg/dL). Hemoglobin & Hematocrit     Component Value Date/Time   HGB 9.5 (L) 04/21/2023 0426   HGB 12.8 (L) 02/11/2012 1227   HCT 31.2 (L) 04/21/2023 0426   HCT 39.3 (L) 02/11/2012 1227     Per Protocol for Patient with estCrcl > 30 ml/min and BMI > 30, will transition to Lovenox 90 mg Q24h.

## 2023-04-21 NOTE — Progress Notes (Signed)
Pharmacy Antibiotic Note  Christopher Herman is a 47 y.o. male admitted on 04/21/2023 with sepsis.  Pharmacy has been consulted for Vancomycin, Zosyn dosing.  Plan: Zosyn 3.375 gm IV Q8H EI ordered to start on 4/23 @ 0600.   Vancomycin 1 gm IV X 1 given in ED on 4/23 @ 0254 followed by additional Vanc 1500 mg IV X 1 @ 0444 to make total loading dose of 2500 mg.   Vancomycin 2 gm IV Q12H ordered to start on 4/23 @ 1500.  AUC = 487.1 Vanc trough = 13.6   Height:  (165.1 cm) Weight: (!) 185.4 kg (408 lb 11.7 oz) IBW/kg (Calculated) : 61.5  Temp (24hrs), Avg:97.3 F (36.3 C), Min:96.8 F (36 C), Max:97.9 F (36.6 C)  Recent Labs  Lab 04/21/23 0201 04/21/23 0426 04/21/23 0429  WBC 24.6* 27.5*  --   CREATININE 0.82 0.74  --   LATICACIDVEN 2.1*  --  2.3*    Estimated Creatinine Clearance: 183.2 mL/min (by C-G formula based on SCr of 0.74 mg/dL).    No Known Allergies  Antimicrobials this admission:   >>    >>   Dose adjustments this admission:   Microbiology results:  BCx:   UCx:    Sputum:    MRSA PCR:   Thank you for allowing pharmacy to be a part of this patient's care.  Romanda Turrubiates D 04/21/2023 6:45 AM

## 2023-04-22 ENCOUNTER — Inpatient Hospital Stay: Payer: BC Managed Care – PPO

## 2023-04-22 ENCOUNTER — Inpatient Hospital Stay (HOSPITAL_COMMUNITY)
Admit: 2023-04-22 | Discharge: 2023-04-22 | Disposition: A | Discharge status: Home / Self Care | Payer: BC Managed Care – PPO | Attending: Critical Care Medicine | Admitting: Critical Care Medicine

## 2023-04-22 DIAGNOSIS — Z7189 Other specified counseling: Secondary | ICD-10-CM | POA: Diagnosis not present

## 2023-04-22 DIAGNOSIS — R7881 Bacteremia: Secondary | ICD-10-CM | POA: Diagnosis not present

## 2023-04-22 DIAGNOSIS — Z8614 Personal history of Methicillin resistant Staphylococcus aureus infection: Secondary | ICD-10-CM | POA: Diagnosis not present

## 2023-04-22 DIAGNOSIS — R579 Shock, unspecified: Secondary | ICD-10-CM

## 2023-04-22 DIAGNOSIS — E039 Hypothyroidism, unspecified: Secondary | ICD-10-CM

## 2023-04-22 LAB — ECHOCARDIOGRAM COMPLETE
AR max vel: 3.49 cm2
AV Area VTI: 3.37 cm2
AV Area mean vel: 3.57 cm2
AV Mean grad: 1 mmHg
AV Peak grad: 1.9 mmHg
Ao pk vel: 0.7 m/s
Area-P 1/2: 11.49 cm2
Height: 65 in
S' Lateral: 3.3 cm
Weight: 6208.15 oz

## 2023-04-22 LAB — BASIC METABOLIC PANEL
Anion gap: 8 (ref 5–15)
BUN: 29 mg/dL — ABNORMAL HIGH (ref 6–20)
CO2: 22 mmol/L (ref 22–32)
Calcium: 7.7 mg/dL — ABNORMAL LOW (ref 8.9–10.3)
Chloride: 97 mmol/L — ABNORMAL LOW (ref 98–111)
Creatinine, Ser: 0.87 mg/dL (ref 0.61–1.24)
GFR, Estimated: >60 mL/min (ref >60)
Glucose, Bld: 131 mg/dL — ABNORMAL HIGH (ref 70–99)
Potassium: 3.3 mmol/L — ABNORMAL LOW (ref 3.5–5.1)
Sodium: 127 mmol/L — ABNORMAL LOW (ref 135–145)

## 2023-04-22 LAB — CBC WITH DIFFERENTIAL/PLATELET
Abs Immature Granulocytes: 0.35 10*3/uL — ABNORMAL HIGH (ref 0.00–0.07)
Basophils Absolute: 0 10*3/uL (ref 0.0–0.1)
Basophils Relative: 0 %
Eosinophils Absolute: 0 10*3/uL (ref 0.0–0.5)
Eosinophils Relative: 0 %
HCT: 27 % — ABNORMAL LOW (ref 39.0–52.0)
Hemoglobin: 8.9 g/dL — ABNORMAL LOW (ref 13.0–17.0)
Immature Granulocytes: 1 %
Lymphocytes Relative: 7 %
Lymphs Abs: 2.3 10*3/uL (ref 0.7–4.0)
MCH: 27.2 pg (ref 26.0–34.0)
MCHC: 33 g/dL (ref 30.0–36.0)
MCV: 82.6 fL (ref 80.0–100.0)
Monocytes Absolute: 1.2 10*3/uL — ABNORMAL HIGH (ref 0.1–1.0)
Monocytes Relative: 4 %
Neutro Abs: 28.9 10*3/uL — ABNORMAL HIGH (ref 1.7–7.7)
Neutrophils Relative %: 88 %
Platelets: 575 10*3/uL — ABNORMAL HIGH (ref 150–400)
RBC: 3.27 MIL/uL — ABNORMAL LOW (ref 4.22–5.81)
RDW: 18.6 % — ABNORMAL HIGH (ref 11.5–15.5)
Smear Review: NORMAL
WBC: 32.8 10*3/uL — ABNORMAL HIGH (ref 4.0–10.5)
nRBC: 0 % (ref 0.0–0.2)

## 2023-04-22 LAB — IRON AND TIBC
Iron: 21 ug/dL — ABNORMAL LOW (ref 45–182)
Saturation Ratios: 19 % (ref 17.9–39.5)
TIBC: 113 ug/dL — ABNORMAL LOW (ref 250–450)
UIBC: 92 ug/dL

## 2023-04-22 LAB — CBC
HCT: 31.2 % — ABNORMAL LOW (ref 39.0–52.0)
MCH: 26.6 pg (ref 26.0–34.0)
MCHC: 30.4 g/dL (ref 30.0–36.0)
MCV: 87.4 fL (ref 80.0–100.0)
WBC: 27.5 10*3/uL — ABNORMAL HIGH (ref 4.0–10.5)
nRBC: 0 % (ref 0.0–0.2)

## 2023-04-22 LAB — PHOSPHORUS: Phosphorus: 2.9 mg/dL (ref 2.5–4.6)

## 2023-04-22 LAB — FERRITIN: Ferritin: 116 ng/mL (ref 24–336)

## 2023-04-22 LAB — FOLATE: Folate: 6.6 ng/mL (ref >5.9)

## 2023-04-22 LAB — GLUCOSE, CAPILLARY
Glucose-Capillary: 103 mg/dL — ABNORMAL HIGH (ref 70–99)
Glucose-Capillary: 104 mg/dL — ABNORMAL HIGH (ref 70–99)
Glucose-Capillary: 118 mg/dL — ABNORMAL HIGH (ref 70–99)
Glucose-Capillary: 119 mg/dL — ABNORMAL HIGH (ref 70–99)
Glucose-Capillary: 128 mg/dL — ABNORMAL HIGH (ref 70–99)
Glucose-Capillary: 97 mg/dL (ref 70–99)

## 2023-04-22 LAB — VITAMIN D 25 HYDROXY (VIT D DEFICIENCY, FRACTURES): Vit D, 25-Hydroxy: 25.81 ng/mL — ABNORMAL LOW (ref 30–100)

## 2023-04-22 LAB — MAGNESIUM: Magnesium: 2.1 mg/dL (ref 1.7–2.4)

## 2023-04-22 LAB — VITAMIN B12: Vitamin B-12: 2353 pg/mL — ABNORMAL HIGH (ref 180–914)

## 2023-04-22 LAB — ALBUMIN: Albumin: 2.2 g/dL — ABNORMAL LOW (ref 3.5–5.0)

## 2023-04-22 MED ORDER — ENSURE MAX PROTEIN PO LIQD
11.0000 [oz_av] | Freq: Two times a day (BID) | ORAL | Status: DC
  Administered 2023-04-23: 11 [oz_av] via ORAL
  Filled 2023-04-22: qty 330

## 2023-04-22 MED ORDER — SODIUM CHLORIDE 0.9 % IV SOLN
2.0000 g | Freq: Every day | INTRAVENOUS | Status: DC
  Administered 2023-04-22 – 2023-04-24 (×3): 2 g via INTRAVENOUS
  Filled 2023-04-22 (×2): qty 20
  Filled 2023-04-22: qty 2

## 2023-04-22 MED ORDER — POTASSIUM CHLORIDE 20 MEQ PO PACK
40.0000 meq | PACK | ORAL | Status: AC
  Administered 2023-04-22 (×3): 40 meq via ORAL
  Filled 2023-04-22 (×3): qty 2

## 2023-04-22 MED ORDER — VITAMIN D (ERGOCALCIFEROL) 1.25 MG (50000 UNIT) PO CAPS
50000.0000 [IU] | ORAL_CAPSULE | ORAL | Status: DC
  Administered 2023-04-23 – 2023-05-21 (×5): 50000 [IU] via ORAL
  Filled 2023-04-22 (×5): qty 1

## 2023-04-22 MED ORDER — KATE FARMS STANDARD 1.4 PO LIQD
325.0000 mL | Freq: Every day | ORAL | Status: DC
  Filled 2023-04-22: qty 325

## 2023-04-22 MED ORDER — DOCUSATE SODIUM 100 MG PO CAPS
100.0000 mg | ORAL_CAPSULE | Freq: Two times a day (BID) | ORAL | Status: DC
  Filled 2023-04-22: qty 1

## 2023-04-22 MED ORDER — VITAMIN E 180 MG (400 UNIT) PO CAPS
400.0000 [IU] | ORAL_CAPSULE | Freq: Every day | ORAL | Status: DC
  Administered 2023-04-23 – 2023-05-23 (×30): 400 [IU] via ORAL
  Filled 2023-04-22: qty 1
  Filled 2023-04-22 (×2): qty 4
  Filled 2023-04-22 (×2): qty 1
  Filled 2023-04-22 (×2): qty 4
  Filled 2023-04-22 (×5): qty 1
  Filled 2023-04-22 (×2): qty 4
  Filled 2023-04-22 (×2): qty 1
  Filled 2023-04-22 (×2): qty 4
  Filled 2023-04-22: qty 1
  Filled 2023-04-22: qty 4
  Filled 2023-04-22 (×2): qty 1
  Filled 2023-04-22: qty 4
  Filled 2023-04-22 (×5): qty 1
  Filled 2023-04-22: qty 4
  Filled 2023-04-22 (×3): qty 1
  Filled 2023-04-22: qty 4

## 2023-04-22 MED ORDER — CHLORHEXIDINE GLUCONATE CLOTH 2 % EX PADS
6.0000 | MEDICATED_PAD | Freq: Every day | CUTANEOUS | Status: DC
  Administered 2023-04-22 – 2023-05-16 (×25): 6 via TOPICAL

## 2023-04-22 MED ORDER — THIAMINE MONONITRATE 100 MG PO TABS
100.0000 mg | ORAL_TABLET | Freq: Every day | ORAL | Status: DC
  Administered 2023-04-23 – 2023-05-23 (×30): 100 mg via ORAL
  Filled 2023-04-22 (×38): qty 1

## 2023-04-22 MED ORDER — VITAMIN A 3 MG (10000 UNIT) PO CAPS
10000.0000 [IU] | ORAL_CAPSULE | Freq: Every day | ORAL | Status: DC
  Administered 2023-04-23 – 2023-05-23 (×30): 10000 [IU] via ORAL
  Filled 2023-04-22 (×32): qty 1

## 2023-04-22 MED ORDER — POLYETHYLENE GLYCOL 3350 17 G PO PACK
17.0000 g | PACK | Freq: Every day | ORAL | Status: DC
  Administered 2023-04-29: 17 g via ORAL
  Filled 2023-04-22 (×6): qty 1

## 2023-04-22 MED ORDER — LACTATED RINGERS IV BOLUS
1000.0000 mL | Freq: Once | INTRAVENOUS | Status: AC
  Administered 2023-04-22: 1000 mL via INTRAVENOUS

## 2023-04-22 MED ORDER — FOLIC ACID 1 MG PO TABS
1.0000 mg | ORAL_TABLET | Freq: Every day | ORAL | Status: DC
  Administered 2023-04-23 – 2023-05-23 (×30): 1 mg via ORAL
  Filled 2023-04-22 (×30): qty 1

## 2023-04-22 MED ORDER — DOCUSATE SODIUM 50 MG/5ML PO LIQD
100.0000 mg | Freq: Two times a day (BID) | ORAL | Status: DC
  Administered 2023-04-22: 100 mg
  Filled 2023-04-22: qty 10

## 2023-04-22 MED ORDER — MIDODRINE HCL 5 MG PO TABS
10.0000 mg | ORAL_TABLET | Freq: Three times a day (TID) | ORAL | Status: DC
  Administered 2023-04-22 – 2023-05-02 (×29): 10 mg via ORAL
  Filled 2023-04-22 (×29): qty 2

## 2023-04-22 MED ORDER — VANCOMYCIN HCL 2000 MG/400ML IV SOLN
2000.0000 mg | Freq: Once | INTRAVENOUS | Status: AC
  Administered 2023-04-22: 2000 mg via INTRAVENOUS
  Filled 2023-04-22: qty 400

## 2023-04-22 MED ORDER — VANCOMYCIN HCL 1750 MG/350ML IV SOLN
1750.0000 mg | Freq: Two times a day (BID) | INTRAVENOUS | Status: DC
  Administered 2023-04-22 – 2023-04-24 (×4): 1750 mg via INTRAVENOUS
  Filled 2023-04-22 (×5): qty 350

## 2023-04-22 MED ORDER — ADULT MULTIVITAMIN LIQUID CH
15.0000 mL | Freq: Every day | ORAL | Status: DC
  Administered 2023-04-23 – 2023-05-02 (×10): 15 mL via ORAL
  Filled 2023-04-22 (×14): qty 15

## 2023-04-22 NOTE — Progress Notes (Addendum)
PHARMACY CONSULT NOTE  Pharmacy Consult for Electrolyte Monitoring and Replacement   Recent Labs: Potassium (mmol/L)  Date Value  04/22/2023 3.3 (L)  02/11/2012 3.8   Magnesium (mg/dL)  Date Value  47/82/9562 2.1   Calcium (mg/dL)  Date Value  13/07/6577 7.7 (L)   Calcium, Total (mg/dL)  Date Value  46/96/2952 8.7   Albumin (g/dL)  Date Value  84/13/2440 2.2 (L)  02/11/2012 3.3 (L)   Phosphorus (mg/dL)  Date Value  10/25/2535 2.9   Sodium (mmol/L)  Date Value  04/22/2023 127 (L)  02/11/2012 140     Assessment: 48 year old male admitted to CCU with septic shock and severe hypothyroidism. PMH includes severe obesity s/p bariatric surgery, OSA, hypoventilation syndrome, HTN, GERD, cholelithiasis, bilateral extremity lipoma, chronic lymphedema status post IVC filter placement, IDA.  Diet: thin fluids Pressors: norepinephrine  mcg/min Antibiotics: linezolid, piperacillin/tazobactam Steroids: Solucortef  every 8 hours DVT PPX: enoxaparin 0.5mg /kg (BMI > 30)  Goal of Therapy:  Electrolytes within normal limits  Plan:  K+ 3.1 > 3.4 after Kcl 40 meq x 2 doses yesterday. Magnesium within normal limits. Will order Kcl PO packets 40 meq every 4 hours x 3 doses.  Na 130 > 127. Hyponatremia management per primary team Follow up labs tomorrow AM   Elliot Gurney, PharmD, BCPS Clinical Pharmacist  04/22/2023 7:48 AM]

## 2023-04-22 NOTE — Progress Notes (Signed)
Pharmacy Antibiotic Note  Christopher Herman is a 47 y.o. male admitted on 04/21/2023 with sepsis.  Pharmacy has been consulted for Vancomycin and piperacillin/tazobactam dosing. Patient presents with fatigue, decreased appetite and weight loss.    Today, 04/22/2023 Day #2 antibiotics - currently linezolid and piperacillin/tazobactam Renal - SCr 0.87 mg/dl WBC 16.1 - looks to have chronic leukocytosis since Feb 2023 Afebrile BMI ~65 4/13 blood cultures: 3/4 bottles GPC, BCID detects MRSE ID consulted 4/24 Received vancomycin IV 4/23 0254  4/23 0444   Plan: Based on blood cultures, resume vancomycin and stop linezolid. Vancomycin 2gm x 1 now as previous dose given > 24h ago then start vancomycin  IV q12h for AUC = 486 (goal 400-600) Using SCr 0.87 and Vd 0.5 L/kg  Monitor renal function closely and plan to check vancomycin levels at steady-state (~5th dose) Continue piperacillin/tazobactam 3.375gm IV q8h over 4h infusion ID evaluating antibiotic  Height:  (165.1 cm) Weight: (!) 176 kg (388 lb 0.2 oz) IBW/kg (Calculated) : 61.5  Temp (24hrs), Avg:98.3 F (36.8 C), Min:97.9 F (36.6 C), Max:99 F (37.2 C)  Recent Labs  Lab 04/21/23 0201 04/21/23 0426 04/21/23 0429 04/21/23 1505 04/22/23 0556  WBC 24.6* 27.5*  --   --  32.8*  CREATININE 0.82 0.74  --  0.75 0.87  LATICACIDVEN 2.1*  --  2.3* 1.1  --      Estimated Creatinine Clearance: 162.7 mL/min (by C-G formula based on SCr of 0.87 mg/dL).    No Known Allergies  Antimicrobials this admission: Vanc 4/23 >> 4/23, 4/24 >> Linezolid 4/23 >>4/23 Zosyn 4/23 >>  Dose adjustments this admission:   Microbiology results:  4/23 BCx: 3/4 bottles GPC, BCID detects MRSE  Thank you for allowing pharmacy to be a part of this patient's care.  Juliette Alcide, PharmD, BCPS, BCIDP Work Cell: [phone] 04/22/2023 11:52 AM

## 2023-04-22 NOTE — Consult Note (Signed)
WOC Nurse Consult Note: Reason for Consult:patient is admitted for sepsis in the setting of severe malnutrition.  Chronic lower extremity edema, chronic skin changes to bilateral lower legs, abdominal pannus, pubis.  Nonintact lesions to upper buttocks, moisture and pressure and lower back. Right lateral malleolus, pressure Wound type: moisture associated skin damage edema, pressure Pressure Injury POA: Yes Measurement: Right lateral malleolus:  0.3 cm x 0.2 cm x 0.1 cm  Right buttocks ( in skin fold)  1 cm nonintact lesion  Edema to bilateral lower legs  nonintact lesion to left upper thigh:  3 cm x 5.2 cm peeling epithelium Wound bed: red and moist Drainage (amount, consistency, odor) legs with moderate serous weeping to legs Periwound: chronic skin changes to upper thighs and abdominal pannus Dressing procedure/placement/frequency: Daily, cleanse wounds to buttock, left thigh and right malleolus with soap and water and pat dry. Apply aquacel to wound bed.  Cover with foam dressing.  Foam dressing to abrasion on lower back.  Barrier cream to scattered abrasions on buttocks twice daily.  Will not follow at this time.  Please re-consult if needed.  Mike Gip MSN, RN, FNP-BC CWON Wound, Ostomy, Continence Nurse Outpatient [facility] [phone] Pager [phone]

## 2023-04-22 NOTE — Progress Notes (Addendum)
Speech Language Pathology Treatment: Dysphagia  Patient Details Name: Christopher Herman MRN: [id number] DOB: 1976-07-22 Today's Date: 04/22/2023 Time: 2725-3664 SLP Time Calculation (min) (ACUTE ONLY): 50 min  Assessment / Plan / Recommendation Clinical Impression  Pt seen today for ongoing assessment of swallowing; toleration of mech soft/regular diet. Pt awake, alert x4. Verbally communicated w/ this SLP and followed instructions. Pt appeared weak, deconditioned w/ overall w/ low volume of speech at Baseline. Pt stated he has been unable to use his arms recently to feed himself(needs an OT Eval - MD/NSG informed); bedbound and not walking w/ legs weeping this admit, per NSG. Dietician following.   Pt endorsed he felt he has had swallowing "issues" since 2008; and in 2013 when he had Gastric Bypass Surgery. 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 Pt stated there are certain foods "fruits and vegetables" that he can eat "ok"; he has to be "careful" w/ others/liquids -- lying in bed and using a straw since he is unable to feed himself. He has recently become a Dependent Feeder which can increase risk for aspiration/choking in pts.  Pt is on RA and afebrile.     Pt continues to present w/ functional oropharyngeal phase swallowing w/ No overt oropharyngeal phase dysphagia noted w/ the trials consumed at his breakfast meal; No clinical s/s of aspiration noted. Pt consumed po trials w/ w/ functional swallowing and overall adequate oral intake in setting of Gastric Bypass Surgery baseline. Pt appears at reduced risk for aspiration from an oropharyngeal phase standpoint when following general aspiration AND REFLUX precautions.    Pt does have challenging factors that could impact oropharyngeal swallowing to include SEVERELY deconditioned body/State, fatigue/weakness, Dependent Feeder status w/ inability to feed self, and baseline of  Gastric Bypass Surgery w/ potential for Esophageal phase Dysmotility which could impact the pharyngeal phase of swallowing. These factors can increase risk for aspiration, dysphagia as well as decreased oral intake overall.    During po trials at his breakfast meal (omelette, jello, thin liquids via straw), pt consumed all consistencies w/ No overt coughing, decline in vocal quality, or change in respiratory presentation during/post trials. O2 sats remained 99-100%.  Oral phase appeared [facility] w/ timely bolus management and control of bolus propulsion for A-P transfer for swallowing. Oral clearing achieved w/ trial consistencies.  OF NOTE:  pt engaged in various conversations and was given opportunity to talk about his swallowing, but he did NOT mention any difficulty swallowing during the breakfast meal. He stated what to feed him next each trial and alternated foods/liquids appropriately as recommended to ease Esophageal phase motility. Pt swallowed Pills WHOLE in Puree w/ NSG w/ adequate success minus 1 Pills feeling "stuck" -- he was able to clear this feeling w/ sip of liquid and extra tsp of puree. NSG instructed to CRUSH Pills as needed moving forward; NSG and pt agreed.    Recommend continue a fairly Regular/Mech Soft consistency diet(for options/choices) but w/ well-Cut meats/foods, moistened foods; Thin liquids -- carefully monitor straw use for small, single sips Slowly. Pt should help to hold cup when drinking when able to again to reduce risk for aspiration. AVOID PROBLEMATIC FOODS IF TOO DIFFICULT TO CLEAR THE ESOPHAGEAL PHASE. Recommend general aspiration precautions. recommend STRICT REFLUX PRECAUTIONS. Pills CRUSHED vs WHOLE in Puree for safer, easier swallowing. Pt has done this in the past, and it is encouraged now and for D/C in setting of severe deconditioning, lying in bed  swallowing.    Education given on Pills in Puree; food consistencies and easy to eat options -- avoiding problematic  foods; general aspiration precautions; REFLUX precautions to pt. MD/NSG updated, agreed. Dietician f/u for support ongoing. No further skilled ST services indicated currently. MD to reconsult if new swallowing issues arise during admit. Recommend f/u w/ MD/Psychiatry if any feelings of Anxiety surrounding oral intake in setting of current medical status/deconditioning, weight issues/loss, and Gastric Bypass Surgery.        HPI HPI: Pt is a 47 y.o male with significant PMH of severe obesity s/p bariatric surgery in 2013, OSA, hypoventilation syndrome, HTN, GERD, cholelithiasis, bilateral extremity lipoma, chronic lymphedema status post IVC filter placement, IDA.  Pt presents to ER via [facility] ems from home with c/o fatigue, weakness that has been ongoing for several days.  Pt reports recently losing increased weight and has had a decreased appetite, and not been feeling well for last few days.  Pt states he lives at home with his mother.  Pt also reports having decreased use of his arms over last few days. He does not walk.  Pt is otherwise A&O x4.   CXRs x2: No active disease. Pt has not had admits for pneumonia nor cxr imaging per chart.      SLP Plan  All goals met      Recommendations for follow up therapy are one component of a multi-disciplinary discharge planning process, led by the attending physician.  Recommendations may be updated based on patient status, additional functional criteria and insurance authorization.    Recommendations  Diet recommendations: Dysphagia 3 (mechanical soft);Regular;Thin liquid (cut meats/foods; moistened foods) Liquids provided via: Cup;Straw Medication Administration: Whole meds with puree (vs need to CRUSH Pills in Puree for ease of clearing) Supervision: Staff to assist with self feeding;Full supervision/cueing for compensatory strategies (pt is not feeding self currently) Compensations: Minimize environmental distractions;Slow rate;Small sips/bites;Follow  solids with liquid Postural Changes and/or Swallow Maneuvers: Out of bed for meals;Upright 30-60 min after meal;Seated upright 90 degrees (GERD precautions; Gastric Bypass precautions)                OT consult Oral care BID;Oral care before and after PO;Staff/trained caregiver to provide oral care (weak UEs)   Intermittent Supervision/Assistance (feeding support as needed; setup) Dysphagia, unspecified (R13.10) (potential for pharyngoesophageal dysmotility in setting of gastric bypass surgery Baseline; GERD)     All goals met       Jerilynn Som, MS, CCC-SLP Speech Language Pathologist Rehab Services; [facility] [place] [phone] (ascom) Grisel Blumenstock  04/22/2023, 3:50 PM

## 2023-04-22 NOTE — H&P (Signed)
NAME:  Christopher Herman, MRN:  [id number], DOB:  Jul 22, 1976, LOS: 1 ADMISSION DATE:  04/21/2023, CONSULTATION DATE: 04/21/2023 REFERRING MD: Pilar Jarvis, CHIEF COMPLAINT: Generalized weakness   HPI  47 y.o male with significant PMH of severe obesity s/p bariatric surgery, OSA, hypoventilation syndrome, HTN, GERD, cholelithiasis, bilateral extremity lipoma, chronic lymphedema status post IVC filter placement, IDA, who presented to the ED with chief complaints of generalized weakness, extreme fatigue, unintended weight loss, disuse of upper extremities and poor p.o. intake times few days   ED Course: Initial vital signs showed HR of 145 beats/minute, BP 86/66 mm Hg, the RR 22 breaths/minute, and the oxygen saturation 100% on and a temperature of 97.5F (36.4C).   Pertinent Labs/Diagnostics Findings: Na+/ K+:129/3.7 Glucose: 128 BUN/Cr.:30/0.82 WBC:24.6 Hgb/Hct: 10.4/33.2 Plts: 541 TSH:9.134, Free T4:1.37 PCT: pending Lactic acid:2.1 COVID PCR: Negative CXR> no active cardiopulmonary disease  Patient given 30 cc/kg of fluids and started on broad-spectrum antibiotics Vanco cefepime and Flagyl for suspected sepsis of unknown origin with septic shock. Patient remained hypotensive despite IVF boluses therefore was started on Levophed.  (Sepsis reassessment completed). PCCM consulted.  Past Medical History  severe obesity s/p bariatric surgery, OSA, hypoventilation syndrome, HTN, GERD, cholelithiasis, bilateral extremity lipoma, chronic lymphedema status post IVC filter placement, IDA,  Significant Hospital Events   4/23:Admit to ICU with sepsis of unknown source and severe hypothyroidism 4/23 CVl placed, remains on pressors 4/24 remains on pressors, started midodrine  Consults:  None  Procedures:  None  Significant Diagnostic Tests:  4/23: Chest Xray> no active cardiopulmonary process  Interim History / Subjective:  -  Micro Data:  4/23: SARS-CoV-2 PCR> negative 4/23: Influenza  PCR> negative 4/23: Blood culture x2> 4/23: Urine Culture> 4/23: MRSA PCR>>   Antimicrobials:   Antibiotics Given (last 72 hours)     Date/Time Action Medication Dose Rate   04/21/23 0237 New Bag/Given   ceFEPIme (MAXIPIME) 2 g in sodium chloride 0.9 % 100 mL IVPB 2 g 200 mL/hr   04/21/23 0254 New Bag/Given   vancomycin (VANCOCIN) IVPB 1000 mg/200 mL premix 1,000 mg 200 mL/hr   04/21/23 0444 New Bag/Given   vancomycin (VANCOREADY) IVPB 1500 mg/300 mL 1,500 mg 150 mL/hr   04/21/23 0732 New Bag/Given   piperacillin-tazobactam (ZOSYN) IVPB 3.375 g 3.375 g 12.5 mL/hr   04/21/23 1339 New Bag/Given   linezolid (ZYVOX) IVPB 600 mg 600 mg 300 mL/hr   04/21/23 1544 New Bag/Given   piperacillin-tazobactam (ZOSYN) IVPB 3.375 g 3.375 g 12.5 mL/hr   04/21/23 2110 New Bag/Given   linezolid (ZYVOX) IVPB 600 mg 600 mg 300 mL/hr   04/21/23 2311 New Bag/Given   piperacillin-tazobactam (ZOSYN) IVPB 3.375 g 3.375 g 12.5 mL/hr        EVENTS OVERNIGHT   Remains on pressors Alert and awake +septic shock    OBJECTIVE  Blood pressure 101/77, pulse (!) 103, temperature 98.1 F (36.7 C), resp. rate 18, height  (1.651 m), weight (!) 176 kg, SpO2 100 %. CVP:  [0 mmHg] 0 mmHg      Intake/Output Summary (Last 24 hours) at 04/22/2023 0925 Last data filed at 04/22/2023 0600 Gross per 24 hour  Intake 3361.81 ml  Output 250 ml  Net 3111.81 ml    Filed Weights   04/21/23 0152 04/21/23 0410 04/22/23 0500  Weight: (!) 180.1 kg (!) 185.4 kg (!) 176 kg      Review of Systems: Gen:  Denies  fever, sweats, chills weight loss  HEENT: Denies  blurred vision, double vision, ear pain, eye pain, hearing loss, nose bleeds, sore throat Cardiac:  No dizziness, chest pain or heaviness, chest tightness,edema, No JVD Resp:   No cough, -sputum production, -shortness of breath,-wheezing, -hemoptysis,  Other:  All other systems negative   Physical Examination:   General Appearance: No distress  EYES  PERRLA, EOM intact.   NECK Supple, No JVD Pulmonary: normal breath sounds, No wheezing.  CardiovascularNormal S1,S2.  No m/r/g.   Abdomen: Benign, Soft, non-tender. Neurology UE/LE 5/5 strength, no focal deficits Ext severe lymphedema, weeping, fluid draining all over Stage 2 sacral wound ALL OTHER ROS ARE NEGATIVE             Labs/imaging that I havepersonally reviewed  (right click and "Reselect all SmartList Selections" daily)     Labs   CBC: Recent Labs  Lab 04/21/23 0201 04/21/23 0426 04/22/23 0556  WBC 24.6* 27.5* 32.8*  NEUTROABS 20.6*  --  28.9*  HGB 10.4* 9.5* 8.9*  HCT 33.2* 31.2* 27.0*  MCV 85.3 87.4 82.6  PLT 541* 249 575*     Basic Metabolic Panel: Recent Labs  Lab 04/21/23 0201 04/21/23 0426 04/21/23 1505 04/22/23 0556  NA 129* 130* 126* 127*  K 3.7 3.1* 3.4* 3.3*  CL 96* 100 96* 97*  CO2 22 20* 19* 22  GLUCOSE 128* 141* 200* 131*  BUN 30* 26* 32* 29*  CREATININE 0.82 0.74 0.75 0.87  CALCIUM 7.9* 7.3* 7.5* 7.7*  MG  --  2.1  --  2.1  PHOS  --  3.2  --  2.9    GFR: Estimated Creatinine Clearance: 162.7 mL/min (by C-G formula based on SCr of 0.87 mg/dL). Recent Labs  Lab 04/21/23 0201 04/21/23 0426 04/21/23 0429 04/21/23 1505 04/22/23 0556  PROCALCITON  --  0.11  --   --   --   WBC 24.6* 27.5*  --   --  32.8*  LATICACIDVEN 2.1*  --  2.3* 1.1  --      Liver Function Tests: Recent Labs  Lab 04/21/23 0201 04/22/23 0556  AST 16  --   ALT 11  --   ALKPHOS 118  --   BILITOT 1.3*  --   PROT 5.9*  --   ALBUMIN 1.9* 2.2*    No results for input(s): "LIPASE", "AMYLASE" in the last 168 hours. No results for input(s): "AMMONIA" in the last 168 hours.  ABG    Component Value Date/Time   HCO3 18.6 (L) 04/21/2023 1308   ACIDBASEDEF 5.9 (H) 04/21/2023 1308   O2SAT 59.8 04/21/2023 1308     Coagulation Profile: Recent Labs  Lab 04/21/23 0426  INR 1.5*    Cardiac Enzymes: Recent Labs  Lab 04/21/23 0426  CKTOTAL 11*     HbA1C: Hemoglobin A1C  Date/Time Value Ref Range Status  02/11/2012 12:27 PM 5.8 4.2 - 6.3 % Final    Comment:    The American Diabetes Association recommends that a primary goal of therapy should be <7% and that physicians should reevaluate the treatment regimen in patients with HbA1c values consistently >8%.     CBG: Recent Labs  Lab 04/21/23 0540 04/21/23 2044 04/21/23 2312 04/22/23 0401 04/22/23 0744  GLUCAP 152* 155* 138* 119* 128*   Active Hospital Problem list     Assessment & Plan:   47 yo morbidly obese AAM with sepsis /UTI and septic shock  Sepsis/UTI  Septic Shock Continue IV abx  SEPTIC shock SOURCE-UTI -use vasopressors to keep MAP>65 as needed -  follow ABG and LA as needed -follow up cultures -emperic ABX -consider stress dose steroids -aggressive IV fluid Resuscitation    Severe Hypothyroidism: acute on Chronic? Last TSH, Free T4 check was 11 years ago with normal TSH Presents w/ generalized weakness, hyponatremia, hypothermia, hypotension with vasopressor-refractory shock TSH: 9.134 Free T4:1.34 -Will give loading dose IV Synthroid 250 mcg followed by 75-100 mcg daily - hydrocortisone 100 mg IV every 8 for possible concomitant adrenal insufficiency   Hyponatremia No signs of CHF / liver disease / renal failure.  -Follow BMP -treat hypothyroidism as above   ENDO - ICU hypoglycemic\Hyperglycemia protocol -check FSBS per protocol   GI GI PROPHYLAXIS as indicated  NUTRITIONAL STATUS DIET--> as tolerated Constipation protocol as indicated   ELECTROLYTES -follow labs as needed -replace as needed -pharmacy consultation and following   Poor state of health, recommend palliative care team   Best practice:  Diet:  Oral Pain/Anxiety/Delirium protocol (if indicated): No VAP protocol (if indicated): Not indicated DVT prophylaxis: LMWH GI prophylaxis: N/A Glucose control:  SSI No Central venous access:  N/A Arterial line:   N/A Foley:  N/A Mobility:  bed rest  PT consulted: N/A Last date of multidisciplinary goals of care discussion [4/23] Code Status:  full code Disposition: ICU    DVT/GI PRX  assessed I Assessed the need for Labs I Assessed the need for Foley I Assessed the need for Central Venous Line Family Discussion when available I Assessed the need for Mobilization I made an Assessment of medications to be adjusted accordingly Safety Risk assessment completed  CASE DISCUSSED IN MULTIDISCIPLINARY ROUNDS WITH ICU TEAM     Critical Care Time devoted to patient care services described in this note is 45 minutes.  Critical care was necessary to treat /prevent imminent and life-threatening deterioration.   Lucie Leather, M.D.  [facility]  Medical Director [facility] Medical Director [facility] Cardio-Pulmonary Department

## 2023-04-22 NOTE — Consult Note (Signed)
[facility]                                                                                        Patient Identification: Patient Name: Christopher Herman MRN: [id number] Admit Date: 04/21/2023  1:43 AM Today's Date: 04/22/2023 Reason for consult: Bacteremia Requesting provider: Dr. Belia Heman  Principal Problem:   Severe hypothyroidism   Antibiotics:  Linezolid 4/23- Zosyn 4/23-  Lines/Hardware: Right IJ CVC  Assessment 47 year old male with multiple comorbidities including morbid obesity s/p gastric bypass with severe malnutrition, B/L LE lipoma, chronic lyphedema s/p IVC filter placement, h/o chronic leukocytosis s/p incomplete evaluation to ro malignancy by Oncology in 02/2022  who presented to ED on 4/23 with complaint of fatigue/unwell for 2 days as well as decrease in appetite and loss of weight.  Admitted to ICU with initial concerns for sepsis and septic shock and brief requirement of vasopressors.    # Shock - off pressures this morning. Sources considered ( MRSE bacteremia, could be contaminant as welll), UTI ( pyuria), superficial ulcer in the rt ankle and left thigh which does not appear infected. Of note he is also being managed for severe hypothyroidism w concomitant adrenal insufficiency. There is also component of significant dehydration.   # MRSE in 2/2 sets - True bacteremia vs possibility of contamination as well  denies any hardware like joint replacement or artificial heart valves.   Recommendations  Will change linezolid to Vancomycin and zosyn to ceftriaxone. Low suspicion for PsA or anaerobic infection currently  Repeat 2 sets of blood cx ordered for tomorrow  Fu TTE CVC to be removed as hemodynamics allow since off pressors.  Hematology/Oncology consult given h/o chronic leukocytosis with incomplete work up to r/o malignancy  Monitor CBC and BMP  D/w ID pharm D and PCCM    Rest of the management as per the primary team. Please call with questions or concerns.  Thank you for the consult  Odette Fraction, MD Infectious Disease Physician [facility] [address] [place], [place] [address] Phone: [phone]  Fax: [phone]  __________________________________________________________________________________________________________ HPI and Hospital Course: 47 year old male with multiple comorbidities including morbid obesity s/p gastric bypass with severe malnutrition, B/L LE lipoma, chronic lyphedema s/p IVC filter placement, h/o chronic leukocytosis s/p incomplete evaluation to ro malignancy by Oncology in 02/2022 who presented to ED on 4/23 with complaint of fatigue/unwell for 2 days as well as decrease in appetite and loss of weight.  Patient also reported decreased use of his arms over the last few days.  Denies fever chills or sweats.  Denies nausea vomiting abdominal pain or diarrhea.  Denies chest pain cough or shortness of breath.  Denies GU  symptoms  At ED afebrile,  Labs remarkable for hyponatremia, hypoalbuminemia, leukocytosis which seems to be chronic, lactic acid 2.1 which has since resolved 8/23  leukocytes, few bacteria,  Blood cx 4/23 both sets GPC, MRSE in BCID   ROS: as above   Past Medical History:  Diagnosis Date   AKI (acute kidney injury)    Cholelithiasis    Chronic acquired  lymphedema    GERD (gastroesophageal reflux disease)    Helicobacter pylori infection 09/10/2015   History of sepsis    Leg muscle spasm    Leukocytosis    Lipoma of both lower extremities    Palpitations 05/31/2009   Chronic, stable   Presence of IVC filter    Rhabdomyolysis    Sleep apnea    Tachycardia    Vitamin D deficiency    Past Surgical History:  Procedure Laterality Date   GASTRIC BYPASS  05/23/2012    Scheduled Meds:  vitamin C  500 mg Oral BID   docusate sodium  100 mg Oral  BID   enoxaparin (LOVENOX) injection  0.5 mg/kg Subcutaneous Q24H   hydrocortisone sod succinate (SOLU-CORTEF) inj  100 mg Intravenous Q8H   insulin aspart  0-15 Units Subcutaneous Q4H   levothyroxine  100 mcg Oral Q0600   midodrine  10 mg Oral TID WC   multivitamin with minerals  1 tablet Oral Q lunch   pantoprazole (PROTONIX) IV  40 mg Intravenous Q24H   polyethylene glycol  17 g Oral Daily   potassium chloride  40 mEq Oral Q4H   Ensure Max Protein  11 oz Oral TID   zinc sulfate  220 mg Oral Daily   Continuous Infusions:  lactated ringers     linezolid (ZYVOX) IV Stopped (04/21/23 2215)   norepinephrine 4 mcg/min (04/22/23 0451)   piperacillin-tazobactam (ZOSYN)  IV 3.375 g (04/22/23 0928)   vasopressin     PRN Meds:.docusate sodium, polyethylene glycol  No Known Allergies  Social History   Socioeconomic History   Marital status: Single    Spouse name: Not on file   Number of children: 0   Years of education: Not on file   Highest education level: Not on file  Occupational History   Occupation: Employed    Comment: Works as a Ship broker  Tobacco Use   Smoking status: Former   Smokeless tobacco: Never   Tobacco comments:    Quit smoking in 2010, smoked cigarettes, smoked for about 16 years; smoked less than 1/2 pack per day.  Substance and Sexual Activity   Alcohol use: No   Drug use: No   Sexual activity: Not Currently    Birth control/protection: None  Other Topics Concern   Not on file  Social History Narrative   Not on file   Social Determinants of Health   Financial Resource Strain: Not on file  Food Insecurity: No Food Insecurity (11/04/2022)   Hunger Vital Sign    Worried About Running Out of Food in the Last Year: Never true    Ran Out of Food in the Last Year: Never true  Transportation Needs: No Transportation Needs (11/04/2022)   PRAPARE - Administrator, Civil Service (Medical): No    Lack of Transportation (Non-Medical): No   Physical Activity: Inactive (11/04/2022)   Exercise Vital Sign    Days of Exercise per Week: 0 days    Minutes of Exercise per Session: 0 min  Stress: Not on file  Social Connections: Not on file  Intimate Partner Violence: Not on file   Family History  Problem Relation Age of Onset   Cancer Paternal Grandmother    Cancer Paternal Grandfather     Vitals BP 101/77   Pulse (!) 103   Temp 98.1 F (36.7 C)   Resp 18   Ht  (1.651 m)   Wt (!) 176 kg   SpO2 100%  BMI 64.57 kg/m    Physical Exam Constitutional: Morbidly obese male lying in the bed and getting cleaned by 3 staffs     Comments:   Cardiovascular:     Rate and Rhythm: Normal rate and regular rhythm.     Heart sounds: s1s2   Pulmonary:     Effort: Pulmonary effort is normal on room air     Comments: Normal breath sounds   Abdominal:     Palpations: Abdomen is soft.     Tenderness: non distended and non tender   Musculoskeletal:        General: weakness in the upper extremities reportedly due to no use. Rt lateral ankle has a superficial ulcer - does not appear infected, left thigh with superficial ulceration with no signs of infection, sacral ulcer ( non infected appearing per nursing, did not open dressing as was recently changed )  Skin:    Comments: as above   Neurological:     General: awake, alert and oriented, follows commands, difficult moving all extremities due to not using vs obesity. Difficult exam   Psychiatric:        Mood and Affect: Mood normal.    Pertinent Microbiology Results for orders placed or performed during the hospital encounter of 04/21/23  Blood Culture (routine x 2)     Status: None (Preliminary result)   Collection Time: 04/21/23  2:02 AM   Specimen: BLOOD  Result Value Ref Range Status   Specimen Description BLOOD RIGHT ARM  Final   Special Requests   Final    BOTTLES DRAWN AEROBIC AND ANAEROBIC Blood Culture adequate volume   Culture  Setup Time   Final     GRAM POSITIVE COCCI IN BOTH AEROBIC AND ANAEROBIC BOTTLES CRITICAL VALUE NOTED.  VALUE IS CONSISTENT WITH PREVIOUSLY REPORTED AND CALLED VALUE. Performed at [facility], [address] [place], [place] [address]    Culture [facility] POSITIVE COCCI  Final   Report Status PENDING  Incomplete  SARS Coronavirus 2 by RT PCR (hospital order, performed in [facility] hospital lab) *cepheid single result test* Anterior Nasal Swab     Status: None   Collection Time: 04/21/23  2:18 AM   Specimen: Anterior Nasal Swab  Result Value Ref Range Status   SARS Coronavirus 2 by RT PCR NEGATIVE NEGATIVE Final    Comment: (NOTE) SARS-CoV-2 target nucleic acids are NOT DETECTED.  The SARS-CoV-2 RNA is generally detectable in upper and lower respiratory specimens during the acute phase of infection. The lowest concentration of SARS-CoV-2 viral copies this assay can detect is 250 copies / mL. A negative result does not preclude SARS-CoV-2 infection and should not be used as the sole basis for treatment or other patient management decisions.  A negative result may occur with improper specimen collection / handling, submission of specimen other than nasopharyngeal swab, presence of viral mutation(s) within the areas targeted by this assay, and inadequate number of viral copies (<250 copies / mL). A negative result must be combined with clinical observations, patient history, and epidemiological information.  Fact Sheet for Patients:   RoadLapTop.co.za  Fact Sheet for Healthcare Providers: http://kim-miller.com/  This test is not yet approved or  cleared by the [place] FDA and has been authorized for detection and/or diagnosis of SARS-CoV-2 by FDA under an Emergency Use Authorization (EUA).  This EUA will remain in effect (meaning this test can be used) for the duration of the COVID-19 declaration under Section 564(b)(1) of the Act,  21  U.S.C. section 360bbb-3(b)(1), unless the authorization is terminated or revoked sooner.  Performed at [facility], [address] [place], [place] [address]   Blood Culture (routine x 2)     Status: None (Preliminary result)   Collection Time: 04/21/23  2:36 AM   Specimen: BLOOD  Result Value Ref Range Status   Specimen Description BLOOD  LEFT [facility]  Final   Special Requests   Final    BOTTLES DRAWN AEROBIC AND ANAEROBIC Blood Culture adequate volume   Culture  Setup Time   Final    Organism ID to follow GRAM POSITIVE COCCI AEROBIC BOTTLE ONLY CRITICAL RESULT CALLED TO, READ BACK BY AND VERIFIED WITH: JASON ROBINS AT 2213 ON 04/21/23 BY SS Performed at [facility], [address] [place], [place] [address]    Culture GRAM POSITIVE COCCI  Final   Report Status PENDING  Incomplete  Blood Culture ID Panel (Reflexed)     Status: Abnormal   Collection Time: 04/21/23  2:36 AM  Result Value Ref Range Status   Enterococcus faecalis NOT DETECTED NOT DETECTED Final   Enterococcus Faecium NOT DETECTED NOT DETECTED Final   Listeria monocytogenes NOT DETECTED NOT DETECTED Final   Staphylococcus species DETECTED (A) NOT DETECTED Final    Comment: CRITICAL RESULT CALLED TO, READ BACK BY AND VERIFIED WITH: JASON ROBINS AT 2213 ON 04/21/23 BY SS    Staphylococcus aureus (BCID) NOT DETECTED NOT DETECTED Final   Staphylococcus epidermidis DETECTED (A) NOT DETECTED Final    Comment: Methicillin (oxacillin) resistant coagulase negative staphylococcus. Possible blood culture contaminant (unless isolated from more than one blood culture draw or clinical case suggests pathogenicity). No antibiotic treatment is indicated for blood  culture contaminants. CRITICAL RESULT CALLED TO, READ BACK BY AND VERIFIED WITH: JASON ROBINS AT 2213 ON 04/21/23 BY SS    Staphylococcus lugdunensis NOT DETECTED NOT DETECTED Final   Streptococcus species NOT DETECTED NOT DETECTED Final    Streptococcus agalactiae NOT DETECTED NOT DETECTED Final   Streptococcus pneumoniae NOT DETECTED NOT DETECTED Final   Streptococcus pyogenes NOT DETECTED NOT DETECTED Final   A.calcoaceticus-baumannii NOT DETECTED NOT DETECTED Final   Bacteroides fragilis NOT DETECTED NOT DETECTED Final   Enterobacterales NOT DETECTED NOT DETECTED Final   Enterobacter cloacae complex NOT DETECTED NOT DETECTED Final   Escherichia coli NOT DETECTED NOT DETECTED Final   Klebsiella aerogenes NOT DETECTED NOT DETECTED Final   Klebsiella oxytoca NOT DETECTED NOT DETECTED Final   Klebsiella pneumoniae NOT DETECTED NOT DETECTED Final   Proteus species NOT DETECTED NOT DETECTED Final   Salmonella species NOT DETECTED NOT DETECTED Final   Serratia marcescens NOT DETECTED NOT DETECTED Final   Haemophilus influenzae NOT DETECTED NOT DETECTED Final   Neisseria meningitidis NOT DETECTED NOT DETECTED Final   Pseudomonas aeruginosa NOT DETECTED NOT DETECTED Final   Stenotrophomonas maltophilia NOT DETECTED NOT DETECTED Final   Candida albicans NOT DETECTED NOT DETECTED Final   Candida auris NOT DETECTED NOT DETECTED Final   Candida glabrata NOT DETECTED NOT DETECTED Final   Candida krusei NOT DETECTED NOT DETECTED Final   Candida parapsilosis NOT DETECTED NOT DETECTED Final   Candida tropicalis NOT DETECTED NOT DETECTED Final   Cryptococcus neoformans/gattii NOT DETECTED NOT DETECTED Final   Methicillin resistance mecA/C DETECTED (A) NOT DETECTED Final    Comment: CRITICAL RESULT CALLED TO, READ BACK BY AND VERIFIED WITH: JASON ROBINS AT 2213 ON 04/21/23 BY SS Performed at [facility], [address] [place], [place] [address]    Pertinent Lab seen by me:    Latest Ref Rng & Units 04/22/2023    5:56 AM 04/21/2023    4:26 AM 04/21/2023    2:01 AM  CBC  WBC 4.0 - 10.5 K/uL 32.8  27.5  24.6   Hemoglobin 13.0 - 17.0 g/dL 8.9  9.5  19.1   Hematocrit 39.0 - 52.0 % 27.0  31.2  33.2   Platelets 150 - 400  K/uL 575  249  541       Latest Ref Rng & Units 04/22/2023    5:56 AM 04/21/2023    3:05 PM 04/21/2023    4:26 AM  CMP  Glucose 70 - 99 mg/dL 478  295  621   BUN 6 - 20 mg/dL 29  32  26   Creatinine 0.61 - 1.24 mg/dL 3.08  6.57  8.46   Sodium 135 - 145 mmol/L 127  126  130   Potassium 3.5 - 5.1 mmol/L 3.3  3.4  3.1   Chloride 98 - 111 mmol/L 97  96  100   CO2 22 - 32 mmol/L 22  19  20    Calcium 8.9 - 10.3 mg/dL 7.7  7.5  7.3      Pertinent Imagings/Other Imagings Plain films and CT images have been personally visualized and interpreted; radiology reports have been reviewed. Decision making incorporated into the Impression / Recommendations.  US THYROID  Result Date: 04/22/2023 CLINICAL DATA:  Hypothyroidism EXAM: THYROID ULTRASOUND TECHNIQUE: Ultrasound examination of the thyroid gland and adjacent soft tissues was performed. COMPARISON:  None Available. FINDINGS: Parenchymal Echotexture: Moderately heterogenous Isthmus: 0.4 cm thickness Right lobe: 4 x 2.2 x 1.7 cm Left lobe: 4.3 x 1.7 x 1.6 cm _________________________________________________________ Estimated total number of nodules >/= 1 cm: 0 Number of spongiform nodules >/=  2 cm not described below (TR1): 0 Number of mixed cystic and solid nodules >/= 1.5 cm not described below (TR2): 0 _________________________________________________________ 0.5 cm hypoechoic nodular calcifications, inferior right lobe; This nodule does NOT meet TI-RADS criteria for biopsy or dedicated follow-up. 0.4 cm peripherally calcified nodule, superior left lobe; This nodule does NOT meet TI-RADS criteria for biopsy or dedicated follow-up. No regional cervical adenopathy identified. IMPRESSION: Normal-sized thyroid.  No worrisome nodule. The above is in keeping with the ACR TI-RADS recommendations - J Am Coll Radiol 2017;14:587-595. Electronically Signed   By: Corlis Leak M.D.   On: 04/22/2023 07:34   CT PELVIS WO CONTRAST  Result Date: 04/21/2023 CLINICAL  DATA:  Weakness, weight loss. EXAM: CT PELVIS WITHOUT CONTRAST TECHNIQUE: Multidetector CT imaging of the pelvis was performed following the standard protocol without intravenous contrast. Unfortunately, these images are significantly limited due to attenuation artifacts secondary to body habitus. RADIATION DOSE REDUCTION: This exam was performed according to the departmental dose-optimization program which includes automated exposure control, adjustment of the mA and/or kV according to patient size and/or use of iterative reconstruction technique. COMPARISON:  None Available. FINDINGS: Urinary Tract: Urinary bladder is decompressed secondary to Foley catheter. Bowel:  Visualized bowel loops are unremarkable. Vascular/Lymphatic: No pathologically enlarged lymph nodes. No significant vascular abnormality seen. IVC filter is noted in infrarenal position. Reproductive: Prostate is not well visualized due to attenuation artifact due to body habitus. Other:  No definite evidence of ascites or hernia. Musculoskeletal: No suspicious bone lesions identified. No fracture or lytic destruction is noted. IMPRESSION: Severely limited exam due to body attenuation artifact secondary to body habitus. No definite  acute abnormality is noted. Electronically Signed   By: Lupita Raider M.D.   On: 04/21/2023 14:00   DG Chest Port 1 View  Result Date: 04/21/2023 CLINICAL DATA:  Central line placement. EXAM: PORTABLE CHEST 1 VIEW COMPARISON:  Same day. FINDINGS: Interval placement of right internal jugular catheter with distal tip in expected position of cavoatrial junction. However, lung apices were not included in field of view and therefore pneumothorax cannot be excluded on the basis of this exam. IMPRESSION: Right internal jugular catheter tip seen in expected position of cavoatrial junction. This exam is limited as lung apices were not included in field-of-view and therefore pneumothorax cannot be excluded on the basis of this  exam. Electronically Signed   By: Lupita Raider M.D.   On: 04/21/2023 11:10   [place] EKG SITE RITE  Result Date: 04/21/2023 If Site Rite image not attached, placement could not be confirmed due to current cardiac rhythm.  DG Ankle 2 Views Right  Result Date: 04/21/2023 CLINICAL DATA:  Sepsis, right lateral ankle ulceration EXAM: RIGHT ANKLE - 2 VIEW COMPARISON:  None Available. FINDINGS: Osseous structures are diffusely osteopenic. No acute fracture or dislocation. No osseous erosions or abnormal periosteal reaction. Superior calcaneal spur. No ankle effusion. Diffuse subcutaneous edema involving the visualized right ankle and right hindfoot. IMPRESSION: 1. Diffuse subcutaneous edema. No acute fracture or dislocation. Electronically Signed   By: Helyn Numbers M.D.   On: 04/21/2023 03:39   DG Chest Port 1 View  Result Date: 04/21/2023 CLINICAL DATA:  Weakness, hypotension and diaphoresis. EXAM: PORTABLE CHEST 1 VIEW COMPARISON:  February 10, 2022 FINDINGS: The heart size and mediastinal contours are within normal limits. Both lungs are clear. The visualized skeletal structures are unremarkable. IMPRESSION: No active disease. Electronically Signed   By: Aram Candela M.D.   On: 04/21/2023 02:31    I have personally spent 85 minutes involved in face-to-face and non-face-to-face activities for this patient on the day of the visit. Professional time spent includes the following activities: Preparing to see the patient (review of tests), Obtaining and/or reviewing separately obtained history (admission/discharge record), Performing a medically appropriate examination and/or evaluation , Ordering medications/tests/procedures, referring and communicating with other health care professionals, Documenting clinical information in the EMR, Independently interpreting results (not separately reported), Communicating results to the patient/family/caregiver, Counseling and educating the patient/family/caregiver and  Care coordination (not separately reported).  Electronically signed by:   Odette Fraction, MD Infectious Disease Physician [facility] Pager: [phone]

## 2023-04-22 NOTE — Progress Notes (Signed)
*  PRELIMINARY RESULTS* Echocardiogram 2D Echocardiogram has been performed.  Christopher Herman 04/22/2023, 1:52 PM

## 2023-04-22 NOTE — Progress Notes (Signed)
Initial Nutrition Assessment  DOCUMENTATION CODES:   Non-severe (moderate) malnutrition in context of chronic illness  INTERVENTION:   Ensure Max protein supplement po BID, each supplement provides 150kcal and 30g of protein.  Molli Posey supplement chocolate po daily- each supplement provides 325kcal and 16g protein  Liquid MVI daily   Vitamin C  po BID  Folic acid  po daily   Thiamine  po daily   Vitamin A 10,000 units po daily   Ergocalciferol 50,000 units po weekly  Vitamin E 400 units po daily   Zinc  po daily    Pt at high refeed risk; recommend monitor potassium, magnesium and phosphorus labs daily until stable  Recommend creon 36,000 units TID with meals  Pt at high risk for nutrient deficiencies r/t his gastric bypass surgery. Will check thiamine, B12, folate, iron, zinc, copper and vitamins D, A, E & K labs.    NUTRITION DIAGNOSIS:   Moderate Malnutrition related to chronic illness (h/o roux-en-y gastric bypass) as evidenced by moderate fat depletion, moderate muscle depletion, 35 percent weight loss in one year.  GOAL:   Patient will meet greater than or equal to 90% of their needs  MONITOR:   PO intake, Supplement acceptance, Labs, Weight trends, Skin, I & O's  REASON FOR ASSESSMENT:   Rounds    ASSESSMENT:   47 y/o male with h/o hypothyroidism, anxiety, morbid obesity s/p roux-en-y (2013), anxiety, HTN, OSA, GERD, IVC filter, H. pylori, lymphedema and cholelithiasis who is admitted with septic shock.  Met with pt in room today. Pt reports poor appetite and oral intake for several months pta. Pt with h/o roux-en-y gastric bypass back in 2013. Pt reports that he weighed over 600lbs at his heaviest. Pt reports losing over 100lbs after surgery but reports that he gained it all back. Pt reports that he has lost over 200lbs over the past year; this is confirmed in chart if bed weights are correct. Pt reports that he just does not have  much of an appetite. Pt reports taste changes and early satiety.   Pt reports that he has been having a lot of diarrhea and that he has eliminated many foods from his diet as he believes they are making his diarrhea worse. Pt reports that his diarrhea is often a yellow/green color. Pt reports that he has eliminated most carbohydrates from his diet. Pt reports that he avoids protein as he believes this worsens his lymphedema drainage. Pt also reports that tough meats often get stuck in his throat and he has difficulty swallowing certain foods and medications. Pt reports that he is lactose intolerant but he drinks Premier Protein sometimes at home but he does get diarrhea from it. Pt reports that he has been eating a lot of broth and soft foods but reports that he will have diarrhea within 30 minutes after eating. Pt reports occasional vomiting after eating. Pt reports that he has not been taking any vitamins at home; pt reports that he used to take Flintstones vitamins but reports that he quit taking these many months ago.   Pt reports tingling in his arms and legs. Pt reports weakness; pt is bed bound and reports that over the past few weeks, his mother has had to feed him as he is too weak to get the utensils to his mouth. Pt reports that he did not eat any of his lunch today; pt is asking for chicken broth instead.   RD discussed with patient the changes in his  anatomy r/t his roux-en-y and discussed how this effects his nutrient absorption and nutritional status. RD discussed the importance of eating a balanced diet and meeting his protein needs and how this relates to his weakness and wound healing. RD provided recommendations for vitamin supplementation. Pt had numerous vitamin labs return low during an admission from 01/2022; pt reports that he did not continue his supplementation. RD will repeat vitamin labs this admission and supplement as needed.   Pt is willing to try Ensure Max and Molli Posey  supplements in hospital. Pt reports that he will only drink chocolate. RD will add supplements and vitamins to help pt meet his estimated needs. Pt is at high refeed risk. RD discussed with patient about his chronic diarrhea and his risk for bacterial overgrowth and EPI r/t his gastric bypass. Would recommend a trial of creon to see if this helps with pt's chronic diarrhea and malabsorption.   Pt is at high risk for developing severe malnutrition. It is difficult to perform full NFPE on patient in r/t to his body habitus and lymphedema. Pt does meet criteria for moderate malnutrition at this time.   Medications reviewed and include: colace, lovenox, solu-cortef, insulin, synthroid, protonix, miralax, KCl, zinc, ceftriaxone, vancomycin  Labs reviewed: Na 127(L), K 3.3(L), BUN 29(H), P 2.9 wnl, Mg 2.1 wnl Iron 21(L), TIBC 113(L), ferritin 116, folate 6.6 wnl Vitamin D- 25.81(L), B12 2353(H) B1 69.3 wnl, vitamin A <2.5(L), vitamin E 6.3(L), zinc 35(L), vitamin K <1.0(L)- 01/2022  NUTRITION - FOCUSED PHYSICAL EXAM:  Flowsheet Row Most Recent Value  Orbital Region No depletion  Upper Arm Region Moderate depletion  Thoracic and Lumbar Region No depletion  Buccal Region No depletion  Temple Region No depletion  Clavicle Bone Region Moderate depletion  Clavicle and Acromion Bone Region Moderate depletion  Scapular Bone Region No depletion  Dorsal Hand Mild depletion  Patellar Region Unable to assess  Anterior Thigh Region Unable to assess  Posterior Calf Region Unable to assess  Edema (RD Assessment) Severe  Hair Reviewed  Eyes Reviewed  Mouth Reviewed  Skin Reviewed  Nails Reviewed   Diet Order:   Diet Order             Diet regular Room service appropriate? Yes with Assist; Fluid consistency: Thin  Diet effective now                  EDUCATION NEEDS:   Education needs have been addressed  Skin:  Skin Assessment: Reviewed RN Assessment (Right lateral malleolus:  0.3 cm x 0.2  cm x 0.1 cm, right buttocks ( in skin fold) 1 cm, edema to bilateral lower legs  nonintact lesion to left upper thigh: 3 cm x 5.2 cm)  Last BM:  4/21  Height:   Ht Readings from Last 1 Encounters:  04/21/23  (1.651 m)    Weight:   Wt Readings from Last 1 Encounters:  04/22/23 (!) 176 kg    Ideal Body Weight:  61.8 kg  BMI:  Body mass index is 64.57 kg/m.  Estimated Nutritional Needs:   Kcal:  2700-3000kcal/day  Protein:  >135g/day  Fluid:  1.9-2.2L/day  Betsey Holiday MS, RD, LDN Please refer to [facility] for RD and/or RD on-call/weekend/after hours pager

## 2023-04-22 NOTE — Consult Note (Signed)
Consultation Note Date: 04/22/2023   Patient Name: Christopher Herman  DOB: October 16, 1976  MRN: [id number]  Age / Sex: 47 y.o., male  PCP: Pcp, No Referring Physician: Erin Fulling, MD  Reason for Consultation: Establishing goals of care  HPI/Patient Profile: 47 y.o male with significant PMH of severe obesity s/p bariatric surgery, OSA, hypoventilation syndrome, HTN, GERD, cholelithiasis, bilateral extremity lipoma, chronic lymphedema status post IVC filter placement, IDA, who presented to the ED with chief complaints of generalized weakness, extreme fatigue, unintended weight loss, disuse of upper extremities and poor p.o. intake times few days   Clinical Assessment and Goals of Care: Notes and labs reviewed.  Updated by SLP on her evaluation.  In to see patient.  Patient is currently resting in Kin-[place] bed.  No family at bedside.   Patient states he lives with his mother and her husband.  He states he never moved out of the home.  He states he sees his father regularly.  He discusses that he loves art and architecture.  He states he is a "foodie" and enjoys eating.  He states he used to enjoy cooking, in addition to knitting and crocheting.  He advises that around 10 months ago he began having problems with his left hand tingling, and prior to this admission had issues with his right hand tingling, and states "they stopped working".  He states at baseline he has not been eating and drinking well, as food has a different taste to him and he does not want to eat and drink.  He states he had cut protein out of his diet because it leads to the need to have bowel movements.  He states when he began having the tingling, he began adding some protein back but only very small amounts.  He states at times he is concerned that he will get choked, and states that this happens frequently.  He states he even has to think about his  saliva to be sure that it does not slide backwards down his throat on its own without him swallowing.  He discusses having gastric bypass because that was the " in thing" back in 2013.  He states he has lost over 200 pounds.  He states he is worried about gaining weight back, and thus does not want an appetite stimulant.  He states he would like to speak to an RD.  A detailed discussion was had today regarding advanced directives.  Concepts specific to code status, artifical feeding and hydration, IV antibiotics and rehospitalization were discussed.  The difference between an aggressive medical intervention path and a comfort care path was discussed.  Values and goals of care important to patient and family were attempted to be elicited.  Discussed limitations of medical interventions to prolong quality of life in some situations and discussed the concept of human mortality.  Patient states at this time he would want any care available to keep him alive as long as possible.  He states that if he ever says differently, to speak with  his mother about this, because he would not trust this decision himself.  SUMMARY OF RECOMMENDATIONS   Full code/full scope  PMT will follow up        Primary Diagnoses: Present on Admission:  Severe hypothyroidism   I have reviewed the medical record, interviewed the patient and family, and examined the patient. The following aspects are pertinent.  Past Medical History:  Diagnosis Date   AKI (acute kidney injury)    Cholelithiasis    Chronic acquired lymphedema    GERD (gastroesophageal reflux disease)    Helicobacter pylori infection 09/10/2015   History of sepsis    Leg muscle spasm    Leukocytosis    Lipoma of both lower extremities    Palpitations 05/31/2009   Chronic, stable   Presence of IVC filter    Rhabdomyolysis    Sleep apnea    Tachycardia    Vitamin D deficiency    Social History   Socioeconomic History   Marital status: Single     Spouse name: Not on file   Number of children: 0   Years of education: Not on file   Highest education level: Not on file  Occupational History   Occupation: Employed    Comment: Works as a Ship broker  Tobacco Use   Smoking status: Former   Smokeless tobacco: Never   Tobacco comments:    Quit smoking in 2010, smoked cigarettes, smoked for about 16 years; smoked less than 1/2 pack per day.  Substance and Sexual Activity   Alcohol use: No   Drug use: No   Sexual activity: Not Currently    Birth control/protection: None  Other Topics Concern   Not on file  Social History Narrative   Not on file   Social Determinants of Health   Financial Resource Strain: Not on file  Food Insecurity: No Food Insecurity (11/04/2022)   Hunger Vital Sign    Worried About Running Out of Food in the Last Year: Never true    Ran Out of Food in the Last Year: Never true  Transportation Needs: No Transportation Needs (11/04/2022)   PRAPARE - Administrator, Civil Service (Medical): No    Lack of Transportation (Non-Medical): No  Physical Activity: Inactive (11/04/2022)   Exercise Vital Sign    Days of Exercise per Week: 0 days    Minutes of Exercise per Session: 0 min  Stress: Not on file  Social Connections: Not on file   Family History  Problem Relation Age of Onset   Cancer Paternal Grandmother    Cancer Paternal Grandfather    Scheduled Meds:  vitamin C  500 mg Oral BID   docusate sodium  100 mg Oral BID   enoxaparin (LOVENOX) injection  0.5 mg/kg Subcutaneous Q24H   hydrocortisone sod succinate (SOLU-CORTEF) inj  100 mg Intravenous Q8H   insulin aspart  0-15 Units Subcutaneous Q4H   levothyroxine  100 mcg Oral Q0600   midodrine  10 mg Oral TID WC   multivitamin with minerals  1 tablet Oral Q lunch   pantoprazole (PROTONIX) IV  40 mg Intravenous Q24H   polyethylene glycol  17 g Oral Daily   potassium chloride  40 mEq Oral Q4H   Ensure Max Protein  11 oz Oral TID    zinc sulfate  220 mg Oral Daily   Continuous Infusions:  cefTRIAXone (ROCEPHIN)  IV     norepinephrine Stopped (04/22/23 0922)   piperacillin-tazobactam (ZOSYN)  IV 12.5 mL/hr at  04/22/23 1100   vancomycin     vancomycin     vasopressin     PRN Meds:.docusate sodium, polyethylene glycol Medications Prior to Admission:  Prior to Admission medications   Medication Sig Start Date End Date Taking? Authorizing Provider  Cholecalciferol (VITAMIN D3) 50 MCG (2000 UT) capsule Take 1 capsule (2,000 Units total) by mouth daily. Patient not taking: Reported on 10/15/2022 02/21/22   Malva Limes, MD  cyclobenzaprine (FLEXERIL) 5 MG tablet Take 1 tablet (5 mg total) by mouth 3 (three) times daily as needed for muscle spasms. Patient not taking: Reported on 10/15/2022 02/20/22   Merita Norton T, FNP  doxycycline (VIBRA-TABS) 100 MG tablet TAKE 1 TABLET(100 MG) BY MOUTH TWICE DAILY FOR 10 DAYS Patient not taking: Reported on 04/21/2023 01/01/23   Malva Limes, MD  furosemide (LASIX) 40 MG tablet Take 1 tablet (40 mg total) by mouth daily as needed. Take one tablet daily Patient not taking: Reported on 04/21/2023 03/24/22   Malva Limes, MD  Multiple Vitamins-Minerals (MULTIVITAMIN WITH MINERALS) tablet Take 1 tablet by mouth daily. Patient not taking: Reported on 04/21/2023 02/20/22   Jacky Kindle, FNP  Semaglutide-Weight Management (WEGOVY) 0.25 MG/0.5ML SOAJ Inject 0.25 mg into the skin once a week. Increase to 0.5 mg once a week starting with 5th dose Patient not taking: Reported on 10/15/2022 02/21/22   Malva Limes, MD  silver sulfADIAZINE (SILVADENE) 1 % cream APPLY TOPICALLY TO THE AFFECTED AREA DAILY Patient not taking: Reported on 04/21/2023 11/02/22   Malva Limes, MD  simethicone (GAS-X) 80 MG chewable tablet Chew 1 tablet (80 mg total) by mouth 4 (four) times daily. Patient not taking: Reported on 04/21/2023 07/11/22   Merita Norton T, FNP   No Known Allergies Review of Systems   HENT:  Positive for trouble swallowing.     Physical Exam Pulmonary:     Effort: Pulmonary effort is normal.  Neurological:     Mental Status: He is alert.     Vital Signs: BP 104/74   Pulse (!) 113   Temp 97.9 F (36.6 C)   Resp 12   Ht 5\' 5"  (1.651 m)   Wt (!) 176 kg   SpO2 100%   BMI 64.57 kg/m  Pain Scale: 0-10   Pain Score: 0-No pain   SpO2: SpO2: 100 % O2 Device:SpO2: 100 % O2 Flow Rate: .   IO: Intake/output summary:  Intake/Output Summary (Last 24 hours) at 04/22/2023 1207 Last data filed at 04/22/2023 1001 Gross per 24 hour  Intake 3582.34 ml  Output 250 ml  Net 3332.34 ml    LBM: Last BM Date : 04/19/23 Baseline Weight: Weight: (!) 180.1 kg Most recent weight: Weight: (!) 176 kg      Signed by: Morton Stall, NP   Please contact Palliative Medicine Team phone at [phone] for questions and concerns.  For individual provider: See Loretha Stapler

## 2023-04-23 DIAGNOSIS — A419 Sepsis, unspecified organism: Secondary | ICD-10-CM | POA: Diagnosis not present

## 2023-04-23 DIAGNOSIS — R531 Weakness: Secondary | ICD-10-CM | POA: Diagnosis not present

## 2023-04-23 DIAGNOSIS — L97319 Non-pressure chronic ulcer of right ankle with unspecified severity: Secondary | ICD-10-CM | POA: Diagnosis not present

## 2023-04-23 DIAGNOSIS — E039 Hypothyroidism, unspecified: Secondary | ICD-10-CM | POA: Diagnosis not present

## 2023-04-23 DIAGNOSIS — E440 Moderate protein-calorie malnutrition: Secondary | ICD-10-CM | POA: Insufficient documentation

## 2023-04-23 LAB — CBC WITH DIFFERENTIAL/PLATELET
Abs Immature Granulocytes: 1.15 10*3/uL — ABNORMAL HIGH (ref 0.00–0.07)
Basophils Absolute: 0.1 10*3/uL (ref 0.0–0.1)
Basophils Relative: 0 %
Eosinophils Absolute: 0 10*3/uL (ref 0.0–0.5)
Eosinophils Relative: 0 %
HCT: 25.7 % — ABNORMAL LOW (ref 39.0–52.0)
Hemoglobin: 8.2 g/dL — ABNORMAL LOW (ref 13.0–17.0)
Immature Granulocytes: 3 %
Lymphocytes Relative: 5 %
Lymphs Abs: 2.2 10*3/uL (ref 0.7–4.0)
MCH: 27 pg (ref 26.0–34.0)
MCHC: 31.9 g/dL (ref 30.0–36.0)
MCV: 84.5 fL (ref 80.0–100.0)
Monocytes Absolute: 2.5 10*3/uL — ABNORMAL HIGH (ref 0.1–1.0)
Monocytes Relative: 6 %
Neutro Abs: 38.4 10*3/uL — ABNORMAL HIGH (ref 1.7–7.7)
Neutrophils Relative %: 86 %
Platelets: 477 10*3/uL — ABNORMAL HIGH (ref 150–400)
RBC: 3.04 MIL/uL — ABNORMAL LOW (ref 4.22–5.81)
RDW: 19 % — ABNORMAL HIGH (ref 11.5–15.5)
Smear Review: NORMAL
WBC: 44.4 10*3/uL — ABNORMAL HIGH (ref 4.0–10.5)
nRBC: 0 % (ref 0.0–0.2)

## 2023-04-23 LAB — BASIC METABOLIC PANEL
Anion gap: 12 (ref 5–15)
BUN: 31 mg/dL — ABNORMAL HIGH (ref 6–20)
CO2: 20 mmol/L — ABNORMAL LOW (ref 22–32)
Calcium: 7.8 mg/dL — ABNORMAL LOW (ref 8.9–10.3)
Chloride: 98 mmol/L (ref 98–111)
Creatinine, Ser: 0.89 mg/dL (ref 0.61–1.24)
GFR, Estimated: >60 mL/min (ref >60)
Glucose, Bld: 102 mg/dL — ABNORMAL HIGH (ref 70–99)
Potassium: 3.3 mmol/L — ABNORMAL LOW (ref 3.5–5.1)
Sodium: 130 mmol/L — ABNORMAL LOW (ref 135–145)

## 2023-04-23 LAB — GLUCOSE, CAPILLARY
Glucose-Capillary: 111 mg/dL — ABNORMAL HIGH (ref 70–99)
Glucose-Capillary: 103 mg/dL — ABNORMAL HIGH (ref 70–99)
Glucose-Capillary: 112 mg/dL — ABNORMAL HIGH (ref 70–99)
Glucose-Capillary: 121 mg/dL — ABNORMAL HIGH (ref 70–99)
Glucose-Capillary: 132 mg/dL — ABNORMAL HIGH (ref 70–99)

## 2023-04-23 LAB — PHOSPHORUS: Phosphorus: 2.8 mg/dL (ref 2.5–4.6)

## 2023-04-23 LAB — URINE CULTURE: Culture: NO GROWTH

## 2023-04-23 LAB — MAGNESIUM: Magnesium: 2 mg/dL (ref 1.7–2.4)

## 2023-04-23 LAB — T4, FREE: Free T4: 1.17 ng/dL — ABNORMAL HIGH (ref 0.61–1.12)

## 2023-04-23 LAB — TSH: TSH: 1.393 u[IU]/mL (ref 0.350–4.500)

## 2023-04-23 LAB — CULTURE, BLOOD (ROUTINE X 2)

## 2023-04-23 MED ORDER — INSULIN ASPART 100 UNIT/ML IJ SOLN
0.0000 [IU] | Freq: Three times a day (TID) | INTRAMUSCULAR | Status: DC
  Administered 2023-04-23 – 2023-05-04 (×6): 2 [IU] via SUBCUTANEOUS
  Filled 2023-04-23 (×11): qty 1

## 2023-04-23 MED ORDER — POTASSIUM CHLORIDE 10 MEQ/100ML IV SOLN
10.0000 meq | INTRAVENOUS | Status: AC
  Administered 2023-04-23 (×3): 10 meq via INTRAVENOUS
  Filled 2023-04-23 (×3): qty 100

## 2023-04-23 MED ORDER — KATE FARMS STANDARD 1.4 PO LIQD
325.0000 mL | Freq: Three times a day (TID) | ORAL | Status: DC
  Administered 2023-04-23 – 2023-04-28 (×5): 325 mL via ORAL
  Administered 2023-04-29 (×2): 60 mL via ORAL
  Administered 2023-05-02 – 2023-05-07 (×9): 325 mL via ORAL
  Filled 2023-04-23: qty 325

## 2023-04-23 MED ORDER — LACTATED RINGERS IV SOLN: INTRAVENOUS | Status: AC

## 2023-04-23 NOTE — Progress Notes (Signed)
1815-report called to [facility]. 1830-Pt transferred to room 248.

## 2023-04-23 NOTE — Progress Notes (Signed)
PROGRESS NOTE  Christopher Herman    DOB: [birth year], 47 y.o.  UJW:[id number]    Code Status: Full Code   DOA: 04/21/2023   LOS: 2   Brief hospital course  Christopher Herman is a 47 y.o. male with a PMH significant for severe obesity s/p bariatric surgery, OSA, hypoventilation syndrome, HTN, GERD, cholelithiasis, bilateral extremity lipoma, chronic lymphedema status post IVC filter placement, IDA.  They presented from home to the ED on 04/21/2023 with generalized weakness, extreme fatigue, unintended weight loss, disuse of upper extremities and poor p.o. intake  x a few days. He is chronically bed bound and malnourished.   In the ED, it was found that they had BP 100/73  Pulse (!) 110  Temp 98.2 F (36.8 C) (Oral)  Resp 15  Ht 5\' 5"  (1.651 m)  Wt (!) 185.4 kg  SpO2 100%  BMI 68.02 kg/m .  Significant findings included WBC 27.5, hgb 9.5, platelet 249, Cr 0.74, Na+ 130, K+ 3.2. SARS-CoV-2 PCR negative, Influenza PCR> negative. Blood and urine cultures pending.  Chest Xray> no active cardiopulmonary process   They were initially treated with vancomycin, cefepime, metronidazole.  They were admitted to the ICU for septic shock with hypotension resistant to bolus and required vasopressors.   04/23/23 -guarded/stable  Assessment & Plan  Principal Problem:   Severe hypothyroidism Active Problems:   Malnutrition of moderate degree  SEPTIC shock SOURCE-UTI- has been off vasopressors for <24 hours and maintaining MAP ~70. MRSE in 2/2 BxCx - continue midodrine 10mg  TID - continuous tele - continue Abx- ID on board, appreciate your recs  - CTX  - vancomycin - follow cultures - continue IV fluids - monitor CBC   Severe Hypothyroidism- Last TSH, Free T4 check was 11 years ago with normal TSH Presents w/ generalized weakness, hyponatremia, hypothermia, hypotension with vasopressor-refractory shock TSH: 9.134 Free T4:1.34 -s/p IV Synthroid 250 mcg followed by 100 mcg daily. Repeat TSH on  4/25 is 1.393 - hydrocortisone 100 mg IV every 8 for possible concomitant adrenal insufficiency   Hyponatremia- improving No signs of CHF / liver disease / renal failure.  -Follow BMP -treat hypothyroidism as above  Thrombocytosis  Significant leukocytosis and rising despite supportive care and antibiotics. WBC 27.5>32.8>44.4. diff showing abs neutrophil predominance. Platelets 477. Morphology unremarkable on pathology report. - serial CBC - heme/onc consult   Obesity  malnutrition s/p bariatric surgery - RD consult, appreciate your recs  - start creon - continue supplements  Sinus tachycardia  prolonged Qtc- 501. HR in 120-130s and increased up to 150 with straining for BM. ECG this morning without ischemic changes.  - avoid Qtc prolonging agents  Bed-bound - PT/OT evaluation and therapy - frequent turning   Body mass index is 66.15 kg/m.  VTE ppx: lovenox  Diet:     Diet   Diet regular Room service appropriate? Yes with Assist; Fluid consistency: Thin   Consultants: ID  Subjective 04/23/23    Pt reports feeling unwell overall but says that he's doing alright. He shows concerns about his heart since it is beating fast. Denies sensation of flutter or palpitations. Denies CP, SOB. He states that he is bearing down for BM and micturition when his HR significantly increases.    Objective   Vitals:   04/23/23 0200 04/23/23 0406 04/23/23 0500 04/23/23 0600  BP: 108/82 94/69 96/75    Pulse:  100 (!) 114   Resp: (!) 25 17 15    Temp: 97.7 F (36.5 C)  TempSrc:      SpO2:  100% 100%   Weight:    (!) 180.3 kg  Height:        Intake/Output Summary (Last 24 hours) at 04/23/2023 0804 Last data filed at 04/23/2023 0400 Gross per 24 hour  Intake 885.39 ml  Output 350 ml  Net 535.39 ml   Filed Weights   04/21/23 0410 04/22/23 0500 04/23/23 0600  Weight: (!) 185.4 kg (!) 176 kg (!) 180.3 kg    Physical Exam:  General: awake, alert, NAD. Appears tired HEENT:  atraumatic, clear conjunctiva, anicteric sclera, MMM, hearing grossly normal Respiratory: normal respiratory effort. CTAB Cardiovascular: quick capillary refill, normal S1/S2, RRR, no JVD, murmurs Gastrointestinal: obese, soft, NT, ND Nervous: A&O x3. no gross focal neurologic deficits, low volume speech Extremities: no edema, low muscle tone Skin: dry, intact, normal temperature, normal color. No rashes, lesions or ulcers on exposed skin Psychiatry: normal mood, congruent affect  Labs   I have personally reviewed the following labs and imaging studies CBC    Component Value Date/Time   WBC 44.4 (H) 04/23/2023 0535   RBC 3.04 (L) 04/23/2023 0535   HGB 8.2 (L) 04/23/2023 0535   HGB 12.8 (L) 02/11/2012 1227   HCT 25.7 (L) 04/23/2023 0535   HCT 39.3 (L) 02/11/2012 1227   PLT 477 (H) 04/23/2023 0535   PLT 333 02/11/2012 1227   MCV 84.5 04/23/2023 0535   MCV 83 02/11/2012 1227   MCH 27.0 04/23/2023 0535   MCHC 31.9 04/23/2023 0535   RDW 19.0 (H) 04/23/2023 0535   RDW 16.0 (H) 02/11/2012 1227   LYMPHSABS 2.2 04/23/2023 0535   LYMPHSABS 2.2 02/11/2012 1227   MONOABS 2.5 (H) 04/23/2023 0535   MONOABS 0.5 02/11/2012 1227   EOSABS 0.0 04/23/2023 0535   EOSABS 0.1 02/11/2012 1227   BASOSABS 0.1 04/23/2023 0535   BASOSABS 0.0 02/11/2012 1227      Latest Ref Rng & Units 04/23/2023    5:35 AM 04/22/2023    5:56 AM 04/21/2023    3:05 PM  BMP  Glucose 70 - 99 mg/dL 161  096  045   BUN 6 - 20 mg/dL 31  29  32   Creatinine 0.61 - 1.24 mg/dL 4.09  8.11  9.14   Sodium 135 - 145 mmol/L 130  127  126   Potassium 3.5 - 5.1 mmol/L 3.3  3.3  3.4   Chloride 98 - 111 mmol/L 98  97  96   CO2 22 - 32 mmol/L 20  22  19    Calcium 8.9 - 10.3 mg/dL 7.8  7.7  7.5     ECHOCARDIOGRAM COMPLETE  Result Date: 04/22/2023    ECHOCARDIOGRAM REPORT   Patient Name:   Christopher Herman Date of Exam: 04/22/2023 Medical Rec #:  [id number]       Height:       65.0 in Accession #:    [id number]      Weight:        388.0 lb Date of Birth:  1976/12/01       BSA:          2.622 m Patient Age:    46 years        BP:           104/74 mmHg Patient Gender: M               HR:           113 bpm. Exam Location:  [facility] Procedure: 2D Echo, Cardiac Doppler and Color Doppler Indications:     Shock R57.9  History:         Patient has no prior history of Echocardiogram examinations.                  Risk Factors:Sleep Apnea. GERD, palpitations.  Sonographer:     Cristela Blue Referring Phys:  [id number] Ezequiel Essex Diagnosing Phys: Debbe Odea MD IMPRESSIONS  1. Left ventricular ejection fraction, by estimation, is 35 to 40%. The left ventricle has moderately decreased function. The left ventricle demonstrates global hypokinesis. Left ventricular diastolic parameters are consistent with Grade I diastolic dysfunction (impaired relaxation).  2. Right ventricular systolic function is normal. The right ventricular size is normal.  3. The mitral valve is normal in structure. No evidence of mitral valve regurgitation.  4. The aortic valve was not well visualized. Aortic valve regurgitation is not visualized.  5. The inferior vena cava is normal in size with greater than 50% respiratory variability, suggesting right atrial pressure of 3 mmHg. FINDINGS  Left Ventricle: Left ventricular ejection fraction, by estimation, is 35 to 40%. The left ventricle has moderately decreased function. The left ventricle demonstrates global hypokinesis. The left ventricular internal cavity size was normal in size. There is no left ventricular hypertrophy. Left ventricular diastolic parameters are consistent with Grade I diastolic dysfunction (impaired relaxation). Right Ventricle: The right ventricular size is normal. No increase in right ventricular wall thickness. Right ventricular systolic function is normal. Left Atrium: Left atrial size was normal in size. Right Atrium: Right atrial size was normal in size. Pericardium: There is no evidence of pericardial  effusion. Mitral Valve: The mitral valve is normal in structure. No evidence of mitral valve regurgitation. Tricuspid Valve: The tricuspid valve is normal in structure. Tricuspid valve regurgitation is mild. Aortic Valve: The aortic valve was not well visualized. Aortic valve regurgitation is not visualized. Aortic valve mean gradient measures 1.0 mmHg. Aortic valve peak gradient measures 1.9 mmHg. Aortic valve area, by VTI measures 3.37 cm. Pulmonic Valve: The pulmonic valve was normal in structure. Pulmonic valve regurgitation is not visualized. Aorta: The aortic root is normal in size and structure. Venous: The inferior vena cava is normal in size with greater than 50% respiratory variability, suggesting right atrial pressure of 3 mmHg. IAS/Shunts: No atrial level shunt detected by color flow Doppler.  LEFT VENTRICLE PLAX 2D LVIDd:         4.80 cm   Diastology LVIDs:         3.30 cm   LV e' medial:    7.29 cm/s LV PW:         1.30 cm   LV E/e' medial:  6.1 LV IVS:        1.10 cm   LV e' lateral:   6.20 cm/s LVOT diam:     2.30 cm   LV E/e' lateral: 7.2 LV SV:         30 LV SV Index:   12 LVOT Area:     4.15 cm  RIGHT VENTRICLE RV Basal diam:  2.80 cm RV Mid diam:    2.70 cm RV S prime:     22.00 cm/s TAPSE (M-mode): 1.9 cm LEFT ATRIUM           Index       RIGHT ATRIUM          Index LA diam:      2.10 cm 0.80 cm/m  RA Area:     9.10 cm LA Vol (A2C): 10.0 ml 3.80 ml/m  RA Volume:   16.20 ml 6.18 ml/m LA Vol (A4C): 12.8 ml 4.88 ml/m  AORTIC VALVE AV Area (Vmax):    3.49 cm AV Area (Vmean):   3.57 cm AV Area (VTI):     3.37 cm AV Vmax:           69.50 cm/s AV Vmean:          49.850 cm/s AV VTI:            0.090 m AV Peak Grad:      1.9 mmHg AV Mean Grad:      1.0 mmHg LVOT Vmax:         58.30 cm/s LVOT Vmean:        42.800 cm/s LVOT VTI:          0.073 m LVOT/AV VTI ratio: 0.81  AORTA Ao Root diam: 3.30 cm MITRAL VALVE               TRICUSPID VALVE MV Area (PHT): 11.49 cm   TR Peak grad:   20.4 mmHg MV  Decel Time: 66 msec     TR Vmax:        226.00 cm/s MV E velocity: 44.50 cm/s MV A velocity: 88.60 cm/s  SHUNTS MV E/A ratio:  0.50        Systemic VTI:  0.07 m                            Systemic Diam: 2.30 cm Debbe Odea MD Electronically signed by Debbe Odea MD Signature Date/Time: 04/22/2023/2:31:46 PM    Final    US THYROID  Result Date: 04/22/2023 CLINICAL DATA:  Hypothyroidism EXAM: THYROID ULTRASOUND TECHNIQUE: Ultrasound examination of the thyroid gland and adjacent soft tissues was performed. COMPARISON:  None Available. FINDINGS: Parenchymal Echotexture: Moderately heterogenous Isthmus: 0.4 cm thickness Right lobe: 4 x 2.2 x 1.7 cm Left lobe: 4.3 x 1.7 x 1.6 cm _________________________________________________________ Estimated total number of nodules >/= 1 cm: 0 Number of spongiform nodules >/=  2 cm not described below (TR1): 0 Number of mixed cystic and solid nodules >/= 1.5 cm not described below (TR2): 0 _________________________________________________________ 0.5 cm hypoechoic nodular calcifications, inferior right lobe; This nodule does NOT meet TI-RADS criteria for biopsy or dedicated follow-up. 0.4 cm peripherally calcified nodule, superior left lobe; This nodule does NOT meet TI-RADS criteria for biopsy or dedicated follow-up. No regional cervical adenopathy identified. IMPRESSION: Normal-sized thyroid.  No worrisome nodule. The above is in keeping with the ACR TI-RADS recommendations - J Am Coll Radiol 2017;14:587-595. Electronically Signed   By: Corlis Leak M.D.   On: 04/22/2023 07:34   CT PELVIS WO CONTRAST  Result Date: 04/21/2023 CLINICAL DATA:  Weakness, weight loss. EXAM: CT PELVIS WITHOUT CONTRAST TECHNIQUE: Multidetector CT imaging of the pelvis was performed following the standard protocol without intravenous contrast. Unfortunately, these images are significantly limited due to attenuation artifacts secondary to body habitus. RADIATION DOSE REDUCTION: This exam was  performed according to the departmental dose-optimization program which includes automated exposure control, adjustment of the mA and/or kV according to patient size and/or use of iterative reconstruction technique. COMPARISON:  None Available. FINDINGS: Urinary Tract: Urinary bladder is decompressed secondary to Foley catheter. Bowel:  Visualized bowel loops are unremarkable. Vascular/Lymphatic: No pathologically enlarged lymph nodes. No significant vascular abnormality seen. IVC filter is noted in  infrarenal position. Reproductive: Prostate is not well visualized due to attenuation artifact due to body habitus. Other:  No definite evidence of ascites or hernia. Musculoskeletal: No suspicious bone lesions identified. No fracture or lytic destruction is noted. IMPRESSION: Severely limited exam due to body attenuation artifact secondary to body habitus. No definite acute abnormality is noted. Electronically Signed   By: Lupita Raider M.D.   On: 04/21/2023 14:00   DG Chest Port 1 View  Result Date: 04/21/2023 CLINICAL DATA:  Central line placement. EXAM: PORTABLE CHEST 1 VIEW COMPARISON:  Same day. FINDINGS: Interval placement of right internal jugular catheter with distal tip in expected position of cavoatrial junction. However, lung apices were not included in field of view and therefore pneumothorax cannot be excluded on the basis of this exam. IMPRESSION: Right internal jugular catheter tip seen in expected position of cavoatrial junction. This exam is limited as lung apices were not included in field-of-view and therefore pneumothorax cannot be excluded on the basis of this exam. Electronically Signed   By: Lupita Raider M.D.   On: 04/21/2023 11:10    Disposition Plan & Communication  Patient status: Inpatient  Admitted From: Home Planned disposition location: TBD Anticipated discharge date: TBD pending clinical improvement   Family Communication: none at bedside    Author: Leeroy Bock,  DO [facility] 04/23/2023, 8:04 AM   Available by Epic secure chat 7AM-7PM. If 7PM-7AM, please contact night-coverage.  [facility] contact information found on [email or web address].

## 2023-04-23 NOTE — Progress Notes (Signed)
ID brief note   Remains afebrile  WBC went up to 44, but on high dose steroids  Not on pressors Hematology has ordered Flow cytometry as well as BCR- ABL1 FISH  Blood cx 4/23 staph capitis in both sets and staph epin 1 set only, requested sensi on staph capitis from both sets to see if concordant Repeat blood cx 4/25 pending  Urine cx 4/24 pending   4/24 TTE unremarkable   Continue Vancomycin, pharmacy to dose Continue ceftriaxone  Monitor CBC, BMP, Vancomycin trough   Odette Fraction, MD Infectious Disease Physician [facility] [address] [place], [place] [address] Phone: [phone]  Fax: [phone]

## 2023-04-23 NOTE — Progress Notes (Addendum)
PHARMACY CONSULT NOTE  Pharmacy Consult for Electrolyte Monitoring and Replacement   Recent Labs: Potassium (mmol/L)  Date Value  04/23/2023 3.3 (L)  02/11/2012 3.8   Magnesium (mg/dL)  Date Value  82/95/6213 2.0   Calcium (mg/dL)  Date Value  08/65/7846 7.8 (L)   Calcium, Total (mg/dL)  Date Value  96/29/5284 8.7   Albumin (g/dL)  Date Value  13/24/4010 2.2 (L)  02/11/2012 3.3 (L)   Phosphorus (mg/dL)  Date Value  27/25/3664 2.8   Sodium (mmol/L)  Date Value  04/23/2023 130 (L)  02/11/2012 140     Assessment: 47 year old male admitted to CCU with septic shock and severe hypothyroidism. PMH includes severe obesity s/p bariatric surgery, OSA, hypoventilation syndrome, HTN, GERD, cholelithiasis, bilateral extremity lipoma, chronic lymphedema status post IVC filter placement, IDA.  Diet: thin fluids -poor intake Pressors: norepinephrine -24mcg/min and midodrine Antibiotics: vancomycin, ceftriaxone Steroids: Solucortef  every 8 hours DVT PPX: enoxaparin 0.5mg /kg (BMI > 30)  Renal function at baseline  Goal of Therapy:  Electrolytes within normal limits  Plan:  K+ 3.3 > 3.3 after replacement yesterday. Will order Kcl IV39meq x 3 runs.  Na 127>130. Hyponatremia management per primary team Follow up labs tomorrow AM   Elliot Gurney, PharmD, BCPS Clinical Pharmacist  04/23/2023 8:09 AM

## 2023-04-23 NOTE — Progress Notes (Addendum)
Pharmacy Antibiotic Note  Christopher Herman is a 47 y.o. male admitted on 04/21/2023 with sepsis.  Pharmacy has been consulted for Vancomycin and piperacillin/tazobactam dosing. Patient presents with fatigue, decreased appetite and weight loss.    Today, 04/23/2023 Day #3 antibiotics - currently linezolid and piperacillin/tazobactam Renal - SCr 0.89 mg/dl WBC 16.1 > 09.6 - looks to have chronic leukocytosis since Feb 2023 Afebrile BMI ~65 4/13 blood cultures: 3/4 bottles GPC, BCID detects MRSE ID consulted 4/24 Received vancomycin IV 4/23 0254  4/23 0444   Plan: Continue vancomycin  IV q12h for AUC = 486 (goal 400-600) Using SCr 0.87 and Vd 0.5 L/kg  Monitor renal function closely and plan to check vancomycin levels at steady-state (~5th dose) Follow up repeat blood cultures (in process) Zosyn changed to ceftriaxone on 04/22/23   Height:  (165.1 cm) Weight: (!) 180.3 kg (397 lb 7.8 oz) IBW/kg (Calculated) : 61.5  Temp (24hrs), Avg:97.2 F (36.2 C), Min:96.6 F (35.9 C), Max:97.7 F (36.5 C)  Recent Labs  Lab 04/21/23 0201 04/21/23 0426 04/21/23 0429 04/21/23 1505 04/22/23 0556 04/23/23 0535  WBC 24.6* 27.5*  --   --  32.8* 44.4*  CREATININE 0.82 0.74  --  0.75 0.87 0.89  LATICACIDVEN 2.1*  --  2.3* 1.1  --   --      Estimated Creatinine Clearance: 161.6 mL/min (by C-G formula based on SCr of 0.89 mg/dL).    No Known Allergies  Antimicrobials this admission: Vanc 4/23 >> 4/23, 4/24 >> Linezolid 4/23 >>4/23 Zosyn 4/23 >>  Dose adjustments this admission:   Microbiology results:  4/23 BCx: 3/4 bottles GPC, BCID detects MRSE  4/25 Bcx: in process  Thank you for allowing pharmacy to be a part of this patient's care.  Elliot Gurney, PharmD, BCPS Clinical Pharmacist  04/23/2023 1:27 PM

## 2023-04-24 ENCOUNTER — Inpatient Hospital Stay: Payer: BC Managed Care – PPO

## 2023-04-24 DIAGNOSIS — E039 Hypothyroidism, unspecified: Secondary | ICD-10-CM | POA: Diagnosis not present

## 2023-04-24 DIAGNOSIS — R7881 Bacteremia: Secondary | ICD-10-CM

## 2023-04-24 DIAGNOSIS — R531 Weakness: Secondary | ICD-10-CM | POA: Diagnosis not present

## 2023-04-24 DIAGNOSIS — N179 Acute kidney failure, unspecified: Secondary | ICD-10-CM | POA: Diagnosis not present

## 2023-04-24 DIAGNOSIS — E861 Hypovolemia: Secondary | ICD-10-CM

## 2023-04-24 DIAGNOSIS — A419 Sepsis, unspecified organism: Secondary | ICD-10-CM | POA: Diagnosis not present

## 2023-04-24 LAB — BASIC METABOLIC PANEL
Anion gap: 10 (ref 5–15)
BUN: 36 mg/dL — ABNORMAL HIGH (ref 6–20)
CO2: 20 mmol/L — ABNORMAL LOW (ref 22–32)
Calcium: 7.5 mg/dL — ABNORMAL LOW (ref 8.9–10.3)
Chloride: 96 mmol/L — ABNORMAL LOW (ref 98–111)
Creatinine, Ser: 1.06 mg/dL (ref 0.61–1.24)
GFR, Estimated: >60 mL/min (ref >60)
Glucose, Bld: 116 mg/dL — ABNORMAL HIGH (ref 70–99)
Potassium: 3.2 mmol/L — ABNORMAL LOW (ref 3.5–5.1)
Sodium: 126 mmol/L — ABNORMAL LOW (ref 135–145)

## 2023-04-24 LAB — CBC WITH DIFFERENTIAL/PLATELET
Abs Immature Granulocytes: 0.63 10*3/uL — ABNORMAL HIGH (ref 0.00–0.07)
Basophils Absolute: 0 10*3/uL (ref 0.0–0.1)
Basophils Relative: 0 %
Eosinophils Absolute: 0 10*3/uL (ref 0.0–0.5)
Eosinophils Relative: 0 %
HCT: 23.6 % — ABNORMAL LOW (ref 39.0–52.0)
Hemoglobin: 7.7 g/dL — ABNORMAL LOW (ref 13.0–17.0)
Immature Granulocytes: 2 %
Lymphocytes Relative: 6 %
Lymphs Abs: 1.9 10*3/uL (ref 0.7–4.0)
MCH: 27.6 pg (ref 26.0–34.0)
MCHC: 32.6 g/dL (ref 30.0–36.0)
MCV: 84.6 fL (ref 80.0–100.0)
Monocytes Absolute: 0.7 10*3/uL (ref 0.1–1.0)
Monocytes Relative: 2 %
Neutro Abs: 29.1 10*3/uL — ABNORMAL HIGH (ref 1.7–7.7)
Neutrophils Relative %: 90 %
Platelets: 431 10*3/uL — ABNORMAL HIGH (ref 150–400)
RBC: 2.79 MIL/uL — ABNORMAL LOW (ref 4.22–5.81)
RDW: 19.4 % — ABNORMAL HIGH (ref 11.5–15.5)
Smear Review: NORMAL
WBC: 32.4 10*3/uL — ABNORMAL HIGH (ref 4.0–10.5)
nRBC: 0 % (ref 0.0–0.2)

## 2023-04-24 LAB — VITAMIN C: Vitamin C: 0.2 mg/dL — ABNORMAL LOW (ref 0.4–2.0)

## 2023-04-24 LAB — T3, FREE: T3, Free: 0.9 pg/mL — ABNORMAL LOW (ref 2.0–4.4)

## 2023-04-24 LAB — MAGNESIUM: Magnesium: 2 mg/dL (ref 1.7–2.4)

## 2023-04-24 LAB — PHOSPHORUS: Phosphorus: 2.9 mg/dL (ref 2.5–4.6)

## 2023-04-24 LAB — GLUCOSE, CAPILLARY
Glucose-Capillary: 125 mg/dL — ABNORMAL HIGH (ref 70–99)
Glucose-Capillary: 134 mg/dL — ABNORMAL HIGH (ref 70–99)
Glucose-Capillary: 117 mg/dL — ABNORMAL HIGH (ref 70–99)

## 2023-04-24 LAB — CULTURE, BLOOD (ROUTINE X 2)

## 2023-04-24 LAB — VITAMIN B1: Vitamin B1 (Thiamine): 151.4 nmol/L (ref 66.5–200.0)

## 2023-04-24 LAB — VANCOMYCIN, TROUGH: Vancomycin Tr: >60 ug/mL (ref 15–20)

## 2023-04-24 LAB — ZINC: Zinc: 36 ug/dL — ABNORMAL LOW (ref 44–115)

## 2023-04-24 LAB — COPPER, SERUM: Copper: 59 ug/dL — ABNORMAL LOW (ref 69–132)

## 2023-04-24 MED ORDER — POTASSIUM CHLORIDE CRYS ER 20 MEQ PO TBCR
40.0000 meq | EXTENDED_RELEASE_TABLET | Freq: Once | ORAL | Status: AC
  Administered 2023-04-24: 40 meq via ORAL
  Filled 2023-04-24: qty 2

## 2023-04-24 MED ORDER — LACTATED RINGERS IV SOLN: INTRAVENOUS | Status: DC

## 2023-04-24 MED ORDER — DEXTROSE 5 % IV SOLN: 2.0000 mg | Freq: Every day | INTRAVENOUS | Status: DC

## 2023-04-24 MED ORDER — VANCOMYCIN VARIABLE DOSE PER UNSTABLE RENAL FUNCTION (PHARMACIST DOSING): Status: DC

## 2023-04-24 MED ORDER — LOPERAMIDE HCL 2 MG PO CAPS
2.0000 mg | ORAL_CAPSULE | Freq: Three times a day (TID) | ORAL | Status: AC
  Administered 2023-04-24 – 2023-04-25 (×3): 2 mg via ORAL
  Filled 2023-04-24 (×3): qty 1

## 2023-04-24 MED ORDER — POTASSIUM CHLORIDE IN NACL 40-0.9 MEQ/L-% IV SOLN
INTRAVENOUS | Status: DC
  Filled 2023-04-24 (×7): qty 1000

## 2023-04-24 MED ORDER — PANCRELIPASE (LIP-PROT-AMYL) 12000-38000 UNITS PO CPEP
12000.0000 [IU] | ORAL_CAPSULE | Freq: Three times a day (TID) | ORAL | Status: DC
  Administered 2023-04-24 – 2023-04-29 (×10): 12000 [IU] via ORAL
  Filled 2023-04-24 (×14): qty 1

## 2023-04-24 MED ORDER — COPPER 2 MG PO TABS
8.0000 mg | Freq: Every day | Status: DC
  Administered 2023-04-24 – 2023-05-23 (×29): 8 mg via ORAL
  Filled 2023-04-24 (×30): qty 4

## 2023-04-24 NOTE — Consult Note (Signed)
[facility]  CONSULT NOTE    Date: 04/24/2023                  Patient Name:  Christopher Herman  MRN: [id number]  DOB: 1976-09-06  Age / Sex: 47 y.o., male         PCP: Pcp, No                 Service Requesting Consult: TRH                 Reason for Consult: Oliguric            History of Present Illness: Christopher Herman is a 48 y.o.  male with past medical conditions including hypertension, GERD, obstructive sleep apnea, lymphedema with IVC filter placement, obesity status post bariatric surgery, and cholelithiasis, who was admitted to [facility] on 04/21/2023 for Severe hypothyroidism [E03.9] Generalized weakness [R53.1] Lower limb ulcer, ankle, right, with unspecified severity (HCC) [L97.319] Hypothyroidism, unspecified type [E03.9] Sepsis, due to unspecified organism, unspecified whether acute organ dysfunction present [facility]) [A41.9]  Patient presents to the emergency department from home with complaints of fatigue.  Patient is obese, bedbound and lives with his mother.  It was reported that patient has recently experienced weight loss with increased weakness over the past 2 days.  Patient was admitted to ICU and treated for sepsis related to UTI.  Creatinine has remained stable during this admission, 0.82 on admission to 1.06 now.  Electrolytes have been decreased throughout admission, sodium and potassium.  Supplementation received.  Urine output has decreased over admission.  Foley catheter in place.  Renal ultrasound negative for hydronephrosis and shows bilateral nonobstructive stones.   Medications: Outpatient medications: Medications Prior to Admission  Medication Sig Dispense Refill Last Dose   Cholecalciferol (VITAMIN D3) 50 MCG (2000 UT) capsule Take 1 capsule (2,000 Units total) by mouth daily. (Patient not taking: Reported on 10/15/2022) 100 capsule 3 Not Taking   cyclobenzaprine (FLEXERIL) 5 MG tablet Take 1 tablet (5 mg total) by mouth 3 (three)  times daily as needed for muscle spasms. (Patient not taking: Reported on 10/15/2022) 90 tablet 0 Not Taking   doxycycline (VIBRA-TABS) 100 MG tablet TAKE 1 TABLET(100 MG) BY MOUTH TWICE DAILY FOR 10 DAYS (Patient not taking: Reported on 04/21/2023) 20 tablet 0 Not Taking   furosemide (LASIX) 40 MG tablet Take 1 tablet (40 mg total) by mouth daily as needed. Take one tablet daily (Patient not taking: Reported on 04/21/2023) 30 tablet 3 Not Taking   Multiple Vitamins-Minerals (MULTIVITAMIN WITH MINERALS) tablet Take 1 tablet by mouth daily. (Patient not taking: Reported on 04/21/2023) 90 tablet 3 Not Taking   Semaglutide-Weight Management (WEGOVY) 0.25 MG/0.5ML SOAJ Inject 0.25 mg into the skin once a week. Increase to 0.5 mg once a week starting with 5th dose (Patient not taking: Reported on 10/15/2022) 2 mL 2 Not Taking   silver sulfADIAZINE (SILVADENE) 1 % cream APPLY TOPICALLY TO THE AFFECTED AREA DAILY (Patient not taking: Reported on 04/21/2023) 50 g 4 Not Taking   simethicone (GAS-X) 80 MG chewable tablet Chew 1 tablet (80 mg total) by mouth 4 (four) times daily. (Patient not taking: Reported on 04/21/2023) 40 tablet 0 Not Taking    Current medications: Current Facility-Administered Medications  Medication Dose Route Frequency Provider Last Rate Last Admin   0.9 % NaCl with KCl 40 mEq / L  infusion   Intravenous Continuous Leeroy Bock, MD 125 mL/hr at 04/24/23  1158 New Bag at 04/24/23 1158   ascorbic acid (VITAMIN C) tablet 500 mg  500 mg Oral BID Erin Fulling, MD   500 mg at 04/24/23 1205   cefTRIAXone (ROCEPHIN) 2 g in sodium chloride 0.9 % 100 mL IVPB  2 g Intravenous Q1400 Odette Fraction, MD 200 mL/hr at 04/23/23 1506 2 g at 04/23/23 1506   Chlorhexidine Gluconate Cloth 2 % PADS 6 each  6 each Topical Daily Erin Fulling, MD   6 each at 04/24/23 1200   enoxaparin (LOVENOX) injection 90 mg  0.5 mg/kg Subcutaneous Q24H Jimmye Norman, NP   90 mg at 04/24/23 1208   feeding  supplement (KATE FARMS STANDARD 1.4) liquid 325 mL  325 mL Oral TID BM Leeroy Bock, MD   325 mL at 04/23/23 1510   folic acid (FOLVITE) tablet 1 mg  1 mg Oral Daily Kasa, Kurian, MD   1 mg at 04/24/23 1205   hydrocortisone sodium succinate (SOLU-CORTEF) 100 MG injection 100 mg  100 mg Intravenous Q8H Jimmye Norman, NP   100 mg at 04/24/23 1206   insulin aspart (novoLOG) injection 0-15 Units  0-15 Units Subcutaneous TID WC Leeroy Bock, MD   2 Units at 04/24/23 1208   levothyroxine (SYNTHROID) tablet 100 mcg  100 mcg Oral Q0600 Pilar Jarvis, MD   100 mcg at 04/24/23 6045   lipase/protease/amylase (CREON) capsule 12,000 Units  12,000 Units Oral TID AC Leeroy Bock, MD       midodrine (PROAMATINE) tablet 10 mg  10 mg Oral TID WC Erin Fulling, MD   10 mg at 04/24/23 1205   multivitamin liquid 15 mL  15 mL Oral Daily Erin Fulling, MD   15 mL at 04/23/23 0925   pantoprazole (PROTONIX) injection 40 mg  40 mg Intravenous Q24H Ezequiel Essex, NP   40 mg at 04/23/23 1814   polyethylene glycol (MIRALAX / GLYCOLAX) packet 17 g  17 g Oral Daily PRN Jimmye Norman, NP       polyethylene glycol (MIRALAX / GLYCOLAX) packet 17 g  17 g Oral Daily Cordella Register A, RPH       thiamine (VITAMIN B1) tablet 100 mg  100 mg Oral Daily Kasa, Kurian, MD   100 mg at 04/24/23 1205   vancomycin variable dose per unstable renal function (pharmacist dosing)   Does not apply See admin instructions Tressie Ellis, RPH       vitamin A capsule 10,000 Units  10,000 Units Oral Daily Erin Fulling, MD   10,000 Units at 04/24/23 1204   Vitamin D (Ergocalciferol) (DRISDOL) 1.25 MG (50000 UNIT) capsule 50,000 Units  50,000 Units Oral Q7 days Erin Fulling, MD   50,000 Units at 04/23/23 4098   vitamin E capsule 400 Units  400 Units Oral Daily Erin Fulling, MD   400 Units at 04/24/23 1204   zinc sulfate capsule 220 mg  220 mg Oral Daily Erin Fulling, MD   220 mg at 04/24/23 1205      Allergies: No  Known Allergies    Past Medical History: Past Medical History:  Diagnosis Date   AKI (acute kidney injury) (HCC)    Cholelithiasis    Chronic acquired lymphedema    GERD (gastroesophageal reflux disease)    Helicobacter pylori infection 09/10/2015   History of sepsis    Leg muscle spasm    Leukocytosis    Lipoma of both lower extremities    Palpitations 05/31/2009   Chronic,  stable   Presence of IVC filter    Rhabdomyolysis    Sleep apnea    Tachycardia    Vitamin D deficiency      Past Surgical History: Past Surgical History:  Procedure Laterality Date   GASTRIC BYPASS  05/23/2012     Family History: Family History  Problem Relation Age of Onset   Cancer Paternal Grandmother    Cancer Paternal Grandfather      Social History: Social History   Socioeconomic History   Marital status: Single    Spouse name: Not on file   Number of children: 0   Years of education: Not on file   Highest education level: Not on file  Occupational History   Occupation: Employed    Comment: Works as a Ship broker  Tobacco Use   Smoking status: Former   Smokeless tobacco: Never   Tobacco comments:    Quit smoking in 2010, smoked cigarettes, smoked for about 16 years; smoked less than 1/2 pack per day.  Substance and Sexual Activity   Alcohol use: No   Drug use: No   Sexual activity: Not Currently    Birth control/protection: None  Other Topics Concern   Not on file  Social History Narrative   Not on file   Social Determinants of Health   Financial Resource Strain: Not on file  Food Insecurity: No Food Insecurity (11/04/2022)   Hunger Vital Sign    Worried About Running Out of Food in the Last Year: Never true    Ran Out of Food in the Last Year: Never true  Transportation Needs: No Transportation Needs (11/04/2022)   PRAPARE - Administrator, Civil Service (Medical): No    Lack of Transportation (Non-Medical): No  Physical Activity: Inactive  (11/04/2022)   Exercise Vital Sign    Days of Exercise per Week: 0 days    Minutes of Exercise per Session: 0 min  Stress: Not on file  Social Connections: Not on file  Intimate Partner Violence: Not on file     Review of Systems: Review of Systems  Constitutional:  Positive for weight loss. Negative for chills, fever and malaise/fatigue.  HENT:  Negative for congestion, sore throat and tinnitus.   Eyes:  Negative for blurred vision and redness.  Respiratory:  Negative for cough, shortness of breath and wheezing.   Cardiovascular:  Negative for chest pain, palpitations, claudication and leg swelling.  Gastrointestinal:  Negative for abdominal pain, blood in stool, diarrhea, nausea and vomiting.  Genitourinary:  Negative for flank pain, frequency and hematuria.  Musculoskeletal:  Negative for back pain, falls and myalgias.  Skin:  Negative for rash.  Neurological:  Positive for weakness. Negative for dizziness and headaches.  Endo/Heme/Allergies:  Does not bruise/bleed easily.  Psychiatric/Behavioral:  Negative for depression. The patient is not nervous/anxious and does not have insomnia.     Vital Signs: Blood pressure 91/73, pulse (!) 108, temperature 97.8 F (36.6 C), temperature source Oral, resp. rate 18, height 5\' 5"  (1.651 m), weight (!) 180.3 kg, SpO2 100 %.  Weight trends: Filed Weights   04/21/23 0410 04/22/23 0500 04/23/23 0600  Weight: (!) 185.4 kg (!) 176 kg (!) 180.3 kg    Physical Exam: General: NAD  Head: Normocephalic, atraumatic.  Dry oral mucosal membranes  Eyes: Anicteric  Lungs:  Clear to auscultation, normal effort, room air  Heart: Regular rate and rhythm  Abdomen:  Soft, nontender, obese  Extremities:  + peripheral edema.  (Lymphedema present)  Neurologic: Alert and oriented, moving all four extremities  Skin: No lesions  Access: None     Lab results: Basic Metabolic Panel: Recent Labs  Lab 04/21/23 0426 04/21/23 1505 04/22/23 0556  04/23/23 0535 04/24/23 0430  NA 130*   < > 127* 130* 126*  K 3.1*   < > 3.3* 3.3* 3.2*  CL 100   < > 97* 98 96*  CO2 20*   < > 22 20* 20*  GLUCOSE 141*   < > 131* 102* 116*  BUN 26*   < > 29* 31* 36*  CREATININE 0.74   < > 0.87 0.89 1.06  CALCIUM 7.3*   < > 7.7* 7.8* 7.5*  MG 2.1  --  2.1 2.0 2.0  PHOS 3.2  --  2.9 2.8 2.9   < > = values in this interval not displayed.    Liver Function Tests: Recent Labs  Lab 04/21/23 0201 04/22/23 0556  AST 16  --   ALT 11  --   ALKPHOS 118  --   BILITOT 1.3*  --   PROT 5.9*  --   ALBUMIN 1.9* 2.2*   No results for input(s): "LIPASE", "AMYLASE" in the last 168 hours. No results for input(s): "AMMONIA" in the last 168 hours.  CBC: Recent Labs  Lab 04/21/23 0201 04/21/23 0426 04/22/23 0556 04/23/23 0535 04/24/23 0430  WBC 24.6* 27.5* 32.8* 44.4* 32.4*  NEUTROABS 20.6*  --  28.9* 38.4* 29.1*  HGB 10.4* 9.5* 8.9* 8.2* 7.7*  HCT 33.2* 31.2* 27.0* 25.7* 23.6*  MCV 85.3 87.4 82.6 84.5 84.6  PLT 541* 249 575* 477* 431*    Cardiac Enzymes: Recent Labs  Lab 04/21/23 0426  CKTOTAL 11*    BNP: Invalid input(s): "POCBNP"  CBG: Recent Labs  Lab 04/23/23 1127 04/23/23 1653 04/23/23 2114 04/24/23 0758 04/24/23 1151  GLUCAP 112* 121* 132* 125* 134*    Microbiology: Results for orders placed or performed during the hospital encounter of 04/21/23  Blood Culture (routine x 2)     Status: Abnormal (Preliminary result)   Collection Time: 04/21/23  2:02 AM   Specimen: BLOOD  Result Value Ref Range Status   Specimen Description   Final    BLOOD RIGHT ARM Performed at [facility], [address] [place], [place] [address]    Special Requests   Final    BOTTLES DRAWN AEROBIC AND ANAEROBIC Blood Culture adequate volume Performed at [facility], [address], [place], [place] [address]    Culture  Setup Time   Final    GRAM POSITIVE COCCI IN BOTH AEROBIC AND ANAEROBIC BOTTLES CRITICAL VALUE NOTED.   VALUE IS CONSISTENT WITH PREVIOUSLY REPORTED AND CALLED VALUE. Performed at [facility], [address] [place], [place] [address]    Culture (A)  Final    STAPHYLOCOCCUS CAPITIS CULTURE REINCUBATED FOR BETTER GROWTH Performed at [facility], [address] [place], [place] [address]    Report Status PENDING  Incomplete  SARS Coronavirus 2 by RT PCR (hospital order, performed in [facility] hospital lab) *cepheid single result test* Anterior Nasal Swab     Status: None   Collection Time: 04/21/23  2:18 AM   Specimen: Anterior Nasal Swab  Result Value Ref Range Status   SARS Coronavirus 2 by RT PCR NEGATIVE NEGATIVE Final    Comment: (NOTE) SARS-CoV-2 target nucleic acids are NOT DETECTED.  The SARS-CoV-2 RNA is generally detectable in upper and lower respiratory specimens during the  acute phase of infection. The lowest concentration of SARS-CoV-2 viral copies this assay can detect is 250 copies / mL. A negative result does not preclude SARS-CoV-2 infection and should not be used as the sole basis for treatment or other patient management decisions.  A negative result may occur with improper specimen collection / handling, submission of specimen other than nasopharyngeal swab, presence of viral mutation(s) within the areas targeted by this assay, and inadequate number of viral copies (<250 copies / mL). A negative result must be combined with clinical observations, patient history, and epidemiological information.  Fact Sheet for Patients:   RoadLapTop.co.za  Fact Sheet for Healthcare Providers: http://kim-miller.com/  This test is not yet approved or  cleared by the [place] FDA and has been authorized for detection and/or diagnosis of SARS-CoV-2 by FDA under an Emergency Use Authorization (EUA).  This EUA will remain in effect (meaning this test can be used) for the duration of the COVID-19 declaration under  Section 564(b)(1) of the Act, 21 U.S.C. section 360bbb-3(b)(1), unless the authorization is terminated or revoked sooner.  Performed at [facility], [address], [place], [place] [address]   Blood Culture (routine x 2)     Status: Abnormal (Preliminary result)   Collection Time: 04/21/23  2:36 AM   Specimen: BLOOD  Result Value Ref Range Status   Specimen Description   Final    BLOOD  LEFT [facility] Performed at [facility], [address], [place], [place] [address]    Special Requests   Final    BOTTLES DRAWN AEROBIC AND ANAEROBIC Blood Culture adequate volume Performed at [facility], [address] [place], [place] [address]    Culture  Setup Time   Final    GRAM POSITIVE COCCI AEROBIC BOTTLE ONLY CRITICAL RESULT CALLED TO, READ BACK BY AND VERIFIED WITH: JASON ROBINS AT 2213 ON 04/21/23 BY SS    Culture (A)  Final    STAPHYLOCOCCUS EPIDERMIDIS UNABLE TO RECOVER SEPI IN CULTURE STAPHYLOCOCCUS CAPITIS SUSCEPTIBILITIES TO FOLLOW Performed at [facility], [address], [place], [place] [address]    Report Status PENDING  Incomplete  Blood Culture ID Panel (Reflexed)     Status: Abnormal   Collection Time: 04/21/23  2:36 AM  Result Value Ref Range Status   Enterococcus faecalis NOT DETECTED NOT DETECTED Final   Enterococcus Faecium NOT DETECTED NOT DETECTED Final   Listeria monocytogenes NOT DETECTED NOT DETECTED Final   Staphylococcus species DETECTED (A) NOT DETECTED Final    Comment: CRITICAL RESULT CALLED TO, READ BACK BY AND VERIFIED WITH: JASON ROBINS AT 2213 ON 04/21/23 BY SS    Staphylococcus aureus (BCID) NOT DETECTED NOT DETECTED Final   Staphylococcus epidermidis DETECTED (A) NOT DETECTED Final    Comment: Methicillin (oxacillin) resistant coagulase negative staphylococcus. Possible blood culture contaminant (unless isolated from more than one blood culture draw or clinical case suggests pathogenicity). No antibiotic  treatment is indicated for blood  culture contaminants. CRITICAL RESULT CALLED TO, READ BACK BY AND VERIFIED WITH: JASON ROBINS AT 2213 ON 04/21/23 BY SS    Staphylococcus lugdunensis NOT DETECTED NOT DETECTED Final   Streptococcus species NOT DETECTED NOT DETECTED Final   Streptococcus agalactiae NOT DETECTED NOT DETECTED Final   Streptococcus pneumoniae NOT DETECTED NOT DETECTED Final   Streptococcus pyogenes NOT DETECTED NOT DETECTED Final   A.calcoaceticus-baumannii NOT DETECTED NOT DETECTED Final   Bacteroides fragilis NOT DETECTED NOT DETECTED Final   Enterobacterales NOT DETECTED NOT  DETECTED Final   Enterobacter cloacae complex NOT DETECTED NOT DETECTED Final   Escherichia coli NOT DETECTED NOT DETECTED Final   Klebsiella aerogenes NOT DETECTED NOT DETECTED Final   Klebsiella oxytoca NOT DETECTED NOT DETECTED Final   Klebsiella pneumoniae NOT DETECTED NOT DETECTED Final   Proteus species NOT DETECTED NOT DETECTED Final   Salmonella species NOT DETECTED NOT DETECTED Final   Serratia marcescens NOT DETECTED NOT DETECTED Final   Haemophilus influenzae NOT DETECTED NOT DETECTED Final   Neisseria meningitidis NOT DETECTED NOT DETECTED Final   Pseudomonas aeruginosa NOT DETECTED NOT DETECTED Final   Stenotrophomonas maltophilia NOT DETECTED NOT DETECTED Final   Candida albicans NOT DETECTED NOT DETECTED Final   Candida auris NOT DETECTED NOT DETECTED Final   Candida glabrata NOT DETECTED NOT DETECTED Final   Candida krusei NOT DETECTED NOT DETECTED Final   Candida parapsilosis NOT DETECTED NOT DETECTED Final   Candida tropicalis NOT DETECTED NOT DETECTED Final   Cryptococcus neoformans/gattii NOT DETECTED NOT DETECTED Final   Methicillin resistance mecA/C DETECTED (A) NOT DETECTED Final    Comment: CRITICAL RESULT CALLED TO, READ BACK BY AND VERIFIED WITH: JASON ROBINS AT 2213 ON 04/21/23 BY SS Performed at [facility], [address] [place], [place] [address]    Urine Culture (for pregnant, neutropenic or urologic patients or patients with an indwelling urinary catheter)     Status: None   Collection Time: 04/22/23  5:22 PM   Specimen: Urine, Clean Catch  Result Value Ref Range Status   Specimen Description   Final    URINE, CLEAN CATCH Performed at [facility], [address], [place], [place] [address]    Special Requests   Final    NONE Performed at [facility], [address], [place], [place] [address]    Culture   Final    NO GROWTH Performed at [facility], [address] [place], [place] [address]    Report Status 04/23/2023 FINAL  Final  Culture, blood (Routine X 2) w Reflex to ID Panel     Status: None (Preliminary result)   Collection Time: 04/23/23  6:10 AM   Specimen: BLOOD  Result Value Ref Range Status   Specimen Description BLOOD  LEFT HAND  Final   Special Requests   Final    BOTTLES DRAWN AEROBIC ONLY Blood Culture results may not be optimal due to an inadequate volume of blood received in culture bottles   Culture   Final    NO GROWTH < 24 HOURS Performed at [facility], [address] [place], [place] [address]    Report Status PENDING  Incomplete  Culture, blood (Routine X 2) w Reflex to ID Panel     Status: None (Preliminary result)   Collection Time: 04/23/23  6:21 AM   Specimen: BLOOD  Result Value Ref Range Status   Specimen Description BLOOD  RIGHT HAND  Final   Special Requests   Final    BOTTLES DRAWN AEROBIC AND ANAEROBIC Blood Culture adequate volume   Culture   Final    NO GROWTH < 24 HOURS Performed at [facility], [address], [place], [place] [address]    Report Status PENDING  Incomplete    Coagulation Studies: No results for input(s): "LABPROT", "INR" in the last 72 hours.  Urinalysis: No results for input(s): "COLORURINE", "LABSPEC", "PHURINE", "GLUCOSEU", "HGBUR", "BILIRUBINUR", "KETONESUR", "PROTEINUR", "UROBILINOGEN", "NITRITE",  "LEUKOCYTESUR" in the last 72 hours.  Invalid input(s): "  APPERANCEUR"    Imaging: US RENAL  Result Date: 04/24/2023 CLINICAL DATA:  Oliguria EXAM: RENAL / URINARY TRACT ULTRASOUND COMPLETE COMPARISON:  02/11/2022 abdomen ultrasound FINDINGS: Right Kidney: Renal measurements: 9.8 x 3.7 x 4.2 cm = volume: 80 mL. Echogenicity within normal limits. No mass or hydronephrosis visualized. In the inferior pole, there is a shadowing calculus that measures up to 7 mm. Left Kidney: Renal measurements: 9.6 x 4.8 x 4.9 cm = volume: 120 mL. Echogenicity within normal limits. No mass or hydronephrosis visualized. Multiple shadowing calculi are noted, measuring up to 9 mm in the mid left kidney, 7 mm in the mid to inferior left kidney, and 10 mm in the inferior left kidney. Bladder: Foley is noted within the bladder, which is decompressed. Other: None. IMPRESSION: 1. No hydronephrosis. 2. Bilateral nonobstructing renal calculi. Electronically Signed   By: Wiliam Ke M.D.   On: 04/24/2023 11:55   ECHOCARDIOGRAM COMPLETE  Result Date: 04/22/2023    ECHOCARDIOGRAM REPORT   Patient Name:   Christopher Herman Date of Exam: 04/22/2023 Medical Rec #:  [id number]       Height:       65.0 in Accession #:    [id number]      Weight:       388.0 lb Date of Birth:  07/10/1976       BSA:          2.622 m Patient Age:    46 years        BP:           104/74 mmHg Patient Gender: M               HR:           113 bpm. Exam Location:  [facility] Procedure: 2D Echo, Cardiac Doppler and Color Doppler Indications:     Shock R57.9  History:         Patient has no prior history of Echocardiogram examinations.                  Risk Factors:Sleep Apnea. GERD, palpitations.  Sonographer:     Cristela Blue Referring Phys:  [id number] Ezequiel Essex Diagnosing Phys: Debbe Odea MD IMPRESSIONS  1. Left ventricular ejection fraction, by estimation, is 35 to 40%. The left ventricle has moderately decreased function. The left ventricle demonstrates global  hypokinesis. Left ventricular diastolic parameters are consistent with Grade I diastolic dysfunction (impaired relaxation).  2. Right ventricular systolic function is normal. The right ventricular size is normal.  3. The mitral valve is normal in structure. No evidence of mitral valve regurgitation.  4. The aortic valve was not well visualized. Aortic valve regurgitation is not visualized.  5. The inferior vena cava is normal in size with greater than 50% respiratory variability, suggesting right atrial pressure of 3 mmHg. FINDINGS  Left Ventricle: Left ventricular ejection fraction, by estimation, is 35 to 40%. The left ventricle has moderately decreased function. The left ventricle demonstrates global hypokinesis. The left ventricular internal cavity size was normal in size. There is no left ventricular hypertrophy. Left ventricular diastolic parameters are consistent with Grade I diastolic dysfunction (impaired relaxation). Right Ventricle: The right ventricular size is normal. No increase in right ventricular wall thickness. Right ventricular systolic function is normal. Left Atrium: Left atrial size was normal in size. Right Atrium: Right atrial size was normal in size. Pericardium: There is no evidence of pericardial effusion. Mitral Valve: The mitral valve is normal in structure.  No evidence of mitral valve regurgitation. Tricuspid Valve: The tricuspid valve is normal in structure. Tricuspid valve regurgitation is mild. Aortic Valve: The aortic valve was not well visualized. Aortic valve regurgitation is not visualized. Aortic valve mean gradient measures 1.0 mmHg. Aortic valve peak gradient measures 1.9 mmHg. Aortic valve area, by VTI measures 3.37 cm. Pulmonic Valve: The pulmonic valve was normal in structure. Pulmonic valve regurgitation is not visualized. Aorta: The aortic root is normal in size and structure. Venous: The inferior vena cava is normal in size with greater than 50% respiratory variability,  suggesting right atrial pressure of 3 mmHg. IAS/Shunts: No atrial level shunt detected by color flow Doppler.  LEFT VENTRICLE PLAX 2D LVIDd:         4.80 cm   Diastology LVIDs:         3.30 cm   LV e' medial:    7.29 cm/s LV PW:         1.30 cm   LV E/e' medial:  6.1 LV IVS:        1.10 cm   LV e' lateral:   6.20 cm/s LVOT diam:     2.30 cm   LV E/e' lateral: 7.2 LV SV:         30 LV SV Index:   12 LVOT Area:     4.15 cm  RIGHT VENTRICLE RV Basal diam:  2.80 cm RV Mid diam:    2.70 cm RV S prime:     22.00 cm/s TAPSE (M-mode): 1.9 cm LEFT ATRIUM           Index       RIGHT ATRIUM          Index LA diam:      2.10 cm 0.80 cm/m  RA Area:     9.10 cm LA Vol (A2C): 10.0 ml 3.80 ml/m  RA Volume:   16.20 ml 6.18 ml/m LA Vol (A4C): 12.8 ml 4.88 ml/m  AORTIC VALVE AV Area (Vmax):    3.49 cm AV Area (Vmean):   3.57 cm AV Area (VTI):     3.37 cm AV Vmax:           69.50 cm/s AV Vmean:          49.850 cm/s AV VTI:            0.090 m AV Peak Grad:      1.9 mmHg AV Mean Grad:      1.0 mmHg LVOT Vmax:         58.30 cm/s LVOT Vmean:        42.800 cm/s LVOT VTI:          0.073 m LVOT/AV VTI ratio: 0.81  AORTA Ao Root diam: 3.30 cm MITRAL VALVE               TRICUSPID VALVE MV Area (PHT): 11.49 cm   TR Peak grad:   20.4 mmHg MV Decel Time: 66 msec     TR Vmax:        226.00 cm/s MV E velocity: 44.50 cm/s MV A velocity: 88.60 cm/s  SHUNTS MV E/A ratio:  0.50        Systemic VTI:  0.07 m                            Systemic Diam: 2.30 cm Debbe Odea MD Electronically signed by Debbe Odea MD Signature Date/Time: 04/22/2023/2:31:46 PM  Final      Assessment & Plan: Mr. Christopher Herman is a 47 y.o.  male with past medical conditions including hypertension, GERD, obstructive sleep apnea, lymphedema with IVC filter placement, obesity status post bariatric surgery, and cholelithiasis, who was admitted to [facility] on 04/21/2023 for Severe hypothyroidism [E03.9] Generalized weakness [R53.1] Lower limb ulcer, ankle,  right, with unspecified severity (HCC) [L97.319] Hypothyroidism, unspecified type [E03.9] Sepsis, due to unspecified organism, unspecified whether acute organ dysfunction present (HCC) [A41.9]  Acute kidney injury with oliguria, Although creatinine remains normal, decreased urine output noted. Renal ultrasound with no hydronephrosis and bilateral nonobstructive renal stones.  Agree with continued IVF.Will continue to monitor.    2. Hyponatremia/hypokalemia. Supplementation ordered by primary team.   3. Acute metabolic acidosis. Serum bicarb range 19-22. Will continue to monitor.   4. Anemia, Hgb slowly decreasing over admission. Now 7.7. Will continue to monitor for now. Patient denies signs of bleeding.    LOS: 3 Tavion Senkbeil 4/26/202412:10 PM

## 2023-04-24 NOTE — Evaluation (Signed)
Physical Therapy Evaluation Patient Details Name: Christopher Herman MRN: [id number] DOB: Oct 20, 1976 Today's Date: 04/24/2023  History of Present Illness  47 y/o male presented to ED on 04/21/23 for weakness, extreme fatigue, unintended weight loss, disuse of UE and poor P.O. intake. Admitted for sepsis 2/2 unknown etiology and severe hypothyroidism. PMH: severe obesity s/p bariatric surgery, OSA, hypoventilation syndrome, HTN, cholelithiasis, bilateral extremity lipoma, chronic lymphedema s/p IVC filter placement, IDA  Clinical Impression  Patient admitted with the above. PTA, patient has been bedbound for ~3 years and requires assistance from mother and her husband for rolling to perform pericare. Within the past 2 months, patient has required assistance to feed due to B UE weakness. Patient is currently at baseline functioning and will require up to 3-4 people to perform rolling at this time 2/2 weakness and body habitus. No further skilled PT needs identified. Discussed with patient about mother's ability to perform rolling/pericare at current functional state, patient verbalized that she will be able to. Recommend LTC without follow up therapy for skin protection and hygiene.        Recommendations for follow up therapy are one component of a multi-disciplinary discharge planning process, led by the attending physician.  Recommendations may be updated based on patient status, additional functional criteria and insurance authorization.  Follow Up Recommendations Can patient physically be transported by private vehicle: No     Assistance Recommended at Discharge Frequent or constant Supervision/Assistance  Patient can return home with the following  Two people to help with bathing/dressing/bathroom;Assistance with cooking/housework;Assistance with feeding;Direct supervision/assist for medications management;Direct supervision/assist for financial management;Assist for transportation    Equipment  Recommendations Other (comment) (hoyer lift)  Recommendations for Other Services       Functional Status Assessment Patient has not had a recent decline in their functional status     Precautions / Restrictions Restrictions Weight Bearing Restrictions: No      Mobility  Bed Mobility               General bed mobility comments: unable to perform with +2    Transfers                   General transfer comment: unable    Ambulation/Gait                  Stairs            Wheelchair Mobility    Modified Rankin (Stroke Patients Only)       Balance                                             Pertinent Vitals/Pain Pain Assessment Pain Assessment: Faces Faces Pain Scale: No hurt Pain Intervention(s): Monitored during session    Home Living Family/patient expects to be discharged to:: Private residence Living Arrangements: Parent Available Help at Discharge: Family Type of Home: House           Home Equipment: Hospital bed      Prior Function Prior Level of Function : Needs assist             Mobility Comments: has been bedbound for ~3 years. Last attempt at ambulation was mid 2022. ADLs Comments: mom and her husband assist with pericare in bed     Hand Dominance        Extremity/Trunk Assessment  Upper Extremity Assessment Upper Extremity Assessment: Defer to OT evaluation    Lower Extremity Assessment Lower Extremity Assessment: Generalized weakness (ankle DF/PF 2-/5; hip and knee 1/5 bilaterally)       Communication   Communication: No difficulties  Cognition Arousal/Alertness: Awake/alert Behavior During Therapy: Flat affect Overall Cognitive Status: Within Functional Limits for tasks assessed                                          General Comments      Exercises     Assessment/Plan    PT Assessment Patient does not need any further PT services  PT Problem  List         PT Treatment Interventions      PT Goals (Current goals can be found in the Care Plan section)  Acute Rehab PT Goals Patient Stated Goal: did not state PT Goal Formulation: All assessment and education complete, DC therapy    Frequency       Co-evaluation PT/OT/SLP Co-Evaluation/Treatment: Yes Reason for Co-Treatment: Complexity of the patient's impairments (multi-system involvement);For patient/therapist safety PT goals addressed during session: Strengthening/ROM         AM-PAC PT "6 Clicks" Mobility  Outcome Measure Help needed turning from your back to your side while in a flat bed without using bedrails?: Total Help needed moving from lying on your back to sitting on the side of a flat bed without using bedrails?: Total Help needed moving to and from a bed to a chair (including a wheelchair)?: Total Help needed standing up from a chair using your arms (e.g., wheelchair or bedside chair)?: Total Help needed to walk in hospital room?: Total Help needed climbing 3-5 steps with a railing? : Total 6 Click Score: 6    End of Session   Activity Tolerance: Patient tolerated treatment well Patient left: in bed Nurse Communication: Mobility status;Other (comment) (notified RN and NT about need for soft touch call bell) PT Visit Diagnosis: Muscle weakness (generalized) (M62.81)    Time: 9604-5409 PT Time Calculation (min) (ACUTE ONLY): 10 min   Charges:   PT Evaluation $PT Eval Moderate Complexity: 1 Mod          Maylon Peppers, PT, DPT Physical Therapist - [place]  [facility]   Dalaysia Harms A Pinkney Venard 04/24/2023, 1:52 PM

## 2023-04-24 NOTE — Progress Notes (Signed)
Pharmacy Antibiotic Note  Christopher Herman is a 47 y.o. male admitted on 04/21/2023 with sepsis.  Pharmacy has been consulted for Vancomycin and piperacillin/tazobactam dosing. Patient presents with fatigue, decreased appetite and weight loss.    Today, 04/24/2023 Day #4 antibiotics - currently on vancomycin and ceftriaxone Renal - SCr 1.06 mg/dl - trending up WBC 16.1 - looks to have chronic leukocytosis since Feb 2023 Afebrile BMI ~65 4/23 blood cultures: 3/4 bottles GPC, BCID detects MRSE currently S capitis growing in both sets 4/25 blood cultures: 1/3 bottles with GPC (no BCID) ID consulted 4/24 Received vancomycin IV 4/23 0254 1000mg  4/23 0444 1500mg   Vancomycin levels: Dose - vancomycin 1750mg  IV q12h (dose prior to level given 4/25 at 22:12) Vancomycin trough (prior to 4th maintenance dose) 4/26 at 11:10 = > 60 mcg/mL  Plan: Vancomycin level checked this morning when noted SCr trend.  Vancomycin trough supratherapeutic (unexpected based on age and CrCl). Level drawn prior to dose  Hold vancomycin (received about [~365mg ] of the dose at 11:59a today)  Check random level 4/27 with am labs Monitor renal function ID consulted Follow-up cultures/plan - repeat blood cultures with GPC  Height: 5\' 5"  (165.1 cm) Weight: (!) 180.3 kg (397 lb 7.8 oz) IBW/kg (Calculated) : 61.5  Temp (24hrs), Avg:97.7 F (36.5 C), Min:97.6 F (36.4 C), Max:98 F (36.7 C)  Recent Labs  Lab 04/21/23 0201 04/21/23 0426 04/21/23 0429 04/21/23 1505 04/22/23 0556 04/23/23 0535 04/24/23 0430  WBC 24.6* 27.5*  --   --  32.8* 44.4* 32.4*  CREATININE 0.82 0.74  --  0.75 0.87 0.89 1.06  LATICACIDVEN 2.1*  --  2.3* 1.1  --   --   --      Estimated Creatinine Clearance: 135.7 mL/min (by C-G formula based on SCr of 1.06 mg/dL).    No Known Allergies  Antimicrobials this admission: Vanc 4/23 >> 4/23, 4/24 >> Ceftriaxone 4/24 >> Linezolid 4/23 >>4/23 Zosyn 4/23 >>4/24  Dose adjustments  this admission:   Microbiology results:  4/23 BCx: 3/4 bottles GPC, BCID detects MRSE, S capitis in both sets 4/25 blood cultures: 1/3 bottles with GPC (no BCID)  Thank you for allowing pharmacy to be a part of this patient's care.  Juliette Alcide, PharmD, BCPS, BCIDP Work Cell: [phone] 04/24/2023 8:14 AM

## 2023-04-24 NOTE — Evaluation (Signed)
Occupational Therapy Evaluation Patient Details Name: Christopher Herman MRN: [id number] DOB: Jun 13, 1976 Today's Date: 04/24/2023   History of Present Illness 47 y/o male presented to ED on 04/21/23 for weakness, extreme fatigue, unintended weight loss, disuse of UE and poor P.O. intake. Admitted for sepsis 2/2 unknown etiology and severe hypothyroidism. PMH: severe obesity s/p bariatric surgery, OSA, hypoventilation syndrome, HTN, cholelithiasis, bilateral extremity lipoma, chronic lymphedema s/p IVC filter placement, IDA   Clinical Impression   Patient received for OT evaluation. See flowsheet below for details of function. Generally, patient dependent for all ADLs. OT goal for self-feeding. Patient will benefit from continued OT while in acute care.       Recommendations for follow up therapy are one component of a multi-disciplinary discharge planning process, led by the attending physician.  Recommendations may be updated based on patient status, additional functional criteria and insurance authorization.   Assistance Recommended at Discharge Frequent or constant Supervision/Assistance  Patient can return home with the following Other (comment) (+4 people with bed mobility, dependent ADL/IADL)    Functional Status Assessment  Patient has had a recent decline in their functional status and/or demonstrates limited ability to make significant improvements in function in a reasonable and predictable amount of time  Equipment Recommendations  None recommended by OT    Recommendations for Other Services       Precautions / Restrictions Precautions Precautions: Fall Restrictions Weight Bearing Restrictions: No      Mobility Bed Mobility               General bed mobility comments: unable to perform with +2    Transfers                   General transfer comment: unable      Balance                                           ADL either  performed or assessed with clinical judgement   ADL Overall ADL's : Needs assistance/impaired                                       General ADL Comments: Patient currently dependent for all ADLs. Unable to mobilize in bed without +4 assistance per nursing staff.  Arms not functional for self-feeding at this time.     Vision         Perception     Praxis      Pertinent Vitals/Pain Pain Assessment Pain Assessment: No/denies pain (no pain mentioned)     Hand Dominance     Extremity/Trunk Assessment Upper Extremity Assessment Upper Extremity Assessment: RUE deficits/detail;LUE deficits/detail RUE Deficits / Details: Pt able to make weak hand grip (not full grip); would need built-up handle with potential cuff to hold silverware effectively. Very minimal shoulder flexion to lift elbow a few inches off the bed. Unable to flex functionally at elbow. R arm not functional for self-feeding. Able to passively range, so appears limited purely by weakness. LUE Deficits / Details: Pt not able to make L hand grip, minimally able to flex L fingers Very minimal shoulder flexion to lift elbow a few inches off the bed. Unable to flex functionally at elbow. L arm not functional.   Lower Extremity Assessment Lower Extremity Assessment:  Defer to PT evaluation       Communication Communication Communication: No difficulties (pt was difficult to hear- talking in quiet voice and bed motor was loud)   Cognition Arousal/Alertness: Awake/alert Behavior During Therapy: Flat affect Overall Cognitive Status: Within Functional Limits for tasks assessed                                       General Comments  Evaluation limited by pt decreased participation due to wanting to get bed sheets changed instead.    Exercises     Shoulder Instructions      Home Living Family/patient expects to be discharged to:: Private residence Living Arrangements: Parent Available Help  at Discharge: Family Type of Home: House Home Access:  (per medical record, has a few steps to enter)           Bathroom Shower/Tub:  (patient is bedbound; not using shower or commode)         Home Equipment: Hospital bed   Additional Comments: Pt states his mom and her husband help him at home      Prior Functioning/Environment Prior Level of Function : Needs assist             Mobility Comments: Pt states he has been bedbound for ~3 years. Last attempt at ambulation was mid 2022 per therapy notes. ADLs Comments: Pt states that his mom and her husband have been assisting with all ADLs at bed level, including feeding. Pt states his R arm used to be able to self-feed until about a month ago and then it got weaker; L arm has been non-functional for approx 1 year, per pt.        OT Problem List: Decreased range of motion;Decreased strength;Decreased activity tolerance;Impaired UE functional use;Obesity      OT Treatment/Interventions: Self-care/ADL training;Therapeutic exercise;Therapeutic activities;Patient/family education    OT Goals(Current goals can be found in the care plan section) Acute Rehab OT Goals Patient Stated Goal: Get better OT Goal Formulation: With patient Time For Goal Achievement: 05/08/23 Potential to Achieve Goals: Fair ADL Goals Pt Will Perform Eating: with set-up;with adaptive utensils;bed level  OT Frequency: Min 1X/week    Co-evaluation PT/OT/SLP Co-Evaluation/Treatment: Yes Reason for Co-Treatment: Complexity of the patient's impairments (multi-system involvement);For patient/therapist safety PT goals addressed during session: Strengthening/ROM OT goals addressed during session: ADL's and self-care      AM-PAC OT "6 Clicks" Daily Activity     Outcome Measure Help from another person eating meals?: Total Help from another person taking care of personal grooming?: Total Help from another person toileting, which includes using toliet,  bedpan, or urinal?: Total Help from another person bathing (including washing, rinsing, drying)?: Total Help from another person to put on and taking off regular upper body clothing?: Total Help from another person to put on and taking off regular lower body clothing?: Total 6 Click Score: 6   End of Session Nurse Communication: Other (comment) (call bell on bed unable to be utilized by pt due to arm weakness; NA aware. Pt requesting assist for hygiene (states he has had another bowel movement in the bed).)  Activity Tolerance: Patient limited by fatigue Patient left: in bed (call bell on bed unable to be utilized by pt due to arm weakness; NA aware.)  OT Visit Diagnosis: Muscle weakness (generalized) (M62.81)  Time: 1610-9604 OT Time Calculation (min): 9 min Charges:  OT General Charges $OT Visit: 1 Visit OT Evaluation $OT Eval Low Complexity: 1 Low  Linward Foster, MS, OTR/L  Alvester Morin 04/24/2023, 2:56 PM

## 2023-04-24 NOTE — Plan of Care (Signed)
  Problem: Education: Goal: Knowledge of General Education information will improve Description: Including pain rating scale, medication(s)/side effects and non-pharmacologic comfort measures Outcome: Progressing   Problem: Health Behavior/Discharge Planning: Goal: Ability to manage health-related needs will improve Outcome: Progressing   Problem: Clinical Measurements: Goal: Ability to maintain clinical measurements within normal limits will improve Outcome: Progressing Goal: Will remain free from infection Outcome: Progressing Goal: Diagnostic test results will improve Outcome: Progressing Goal: Respiratory complications will improve Outcome: Progressing Goal: Cardiovascular complication will be avoided Outcome: Progressing   Problem: Activity: Goal: Risk for activity intolerance will decrease Outcome: Progressing   Problem: Nutrition: Goal: Adequate nutrition will be maintained Outcome: Progressing   Problem: Coping: Goal: Level of anxiety will decrease Outcome: Progressing   Problem: Elimination: Goal: Will not experience complications related to bowel motility Outcome: Progressing Goal: Will not experience complications related to urinary retention Outcome: Progressing   Problem: Pain Managment: Goal: General experience of comfort will improve Outcome: Progressing   Problem: Safety: Goal: Ability to remain free from injury will improve Outcome: Progressing   Problem: Skin Integrity: Goal: Risk for impaired skin integrity will decrease Outcome: Progressing   Problem: Education: Goal: Ability to describe self-care measures that may prevent or decrease complications (Diabetes Survival Skills Education) will improve Outcome: Progressing Goal: Individualized Educational Video(s) Outcome: Progressing   Problem: Coping: Goal: Ability to adjust to condition or change in health will improve Outcome: Progressing   Problem: Fluid Volume: Goal: Ability to  maintain a balanced intake and output will improve Outcome: Progressing   Problem: Health Behavior/Discharge Planning: Goal: Ability to identify and utilize available resources and services will improve Outcome: Progressing Goal: Ability to manage health-related needs will improve Outcome: Progressing   

## 2023-04-24 NOTE — Progress Notes (Addendum)
[facility] Infectious Diseases Follow Up Note  Patient Identification: Patient Name: Christopher Herman MRN: [id number] Admit Date: 04/21/2023  1:43 AM Age: 47 y.o.Today's Date: 04/24/2023  Reason for Visit: bacteremia/? UTI   Principal Problem:   Severe hypothyroidism Active Problems:   Malnutrition of moderate degree (HCC)   Generalized weakness   Ulcer of right ankle (HCC)  Antibiotics:  Linezolid 4/23- Zosyn 4/23-   Lines/Hardware: Right IJ CVC   Interval Events: Afebrile, leukocytosis is downtrending, hypotensive overnight which responded to IVF. 4/24 urine cx no growth. TTE 4/24 no vegetations or endocarditis. Per Hematology, leukocytosis is likely reactive and no plans for BM biopsy.    Assessment 47 year old male with multiple comorbidities including morbid obesity s/p gastric bypass with severe malnutrition, B/L LE lipoma, chronic lyphedema s/p IVC filter placement, h/o chronic leukocytosis s/p incomplete evaluation to ro malignancy by Oncology in 02/2022  who presented to ED on 4/23 with complaint of fatigue/unwell for 2 days as well as decrease in appetite and loss of weight.  Admitted to ICU with initial concerns for sepsis and septic shock and brief requirement of vasopressors.    # Shock - resolved, off vasopressors  # Positive blood cultures - 4/23 staph capitis in both sets and staph epidermidis in 1/2 set. 4/25 GPC in 1/2 sets ( per micro, appears to be similar to previous blood cx).   # Pyuria - no GU symptoms and hence no indication to treat   # Hypothyroidism - On synthroid and hydrocortisone for possible concomitant adrenal insuff per primary    Recommendations He does not have GU symptoms whatsoever and will DC ceftriaxone  Vancomycin on hold currently due to high trough, pharmacy monitoring  Fu blood cultures to see if concerning for true bacteremia and treatment indicated  I am available remotely this  weekend, Dr Thedore Mins here on Monday  Rest of the management as per the primary team. Thank you for the consult. Please page with pertinent questions or concerns.  ______________________________________________________________________ Subjective patient seen and examined at the bedside. Feels better but still feels tired and fatigued. Denies any GU symptoms like burning, dysuria, suprapubic pain    Vitals BP (!) 142/76 (BP Location: Left Arm)   Pulse 67   Temp 97.6 F (36.4 C) (Oral)   Resp 20   Ht 5\' 5"  (1.651 m)   Wt (!) 180.3 kg   SpO2 97%   BMI 66.15 kg/m     Physical Exam Constitutional:  chronically ill appearing obese male lying in the bed and appears tired     Comments:   Cardiovascular:     Rate and Rhythm: Normal rate and regular rhythm.     Heart sounds:   Pulmonary:     Effort: Pulmonary effort is normal.     Comments:   Abdominal:     Palpations: Abdomen is soft.     Tenderness: large abdomen   Musculoskeletal:        General: chronic lymphedema in bilateral legs     Comments:   Neurological:     General: awake, alert and oriented, follows commands , weakness in the upper extremities   Skin; Rt lateral ankle has a superficial ulcer - does not appear infected, left thigh with superficial ulceration with no signs of infection, sacral ulcer ( non infected appearing per nursing)  Psychiatric:        Mood and Affect: Mood normal.   Pertinent Microbiology Results for orders placed or performed during the hospital encounter  of 04/21/23  Blood Culture (routine x 2)     Status: Abnormal (Preliminary result)   Collection Time: 04/21/23  2:02 AM   Specimen: BLOOD  Result Value Ref Range Status   Specimen Description   Final    BLOOD RIGHT ARM Performed at [facility], [address], [place], [place] [address]    Special Requests   Final    BOTTLES DRAWN AEROBIC AND ANAEROBIC Blood Culture adequate volume Performed at [facility], [address], [place], [place] [address]    Culture  Setup Time   Final    GRAM POSITIVE COCCI IN BOTH AEROBIC AND ANAEROBIC BOTTLES CRITICAL VALUE NOTED.  VALUE IS CONSISTENT WITH PREVIOUSLY REPORTED AND CALLED VALUE. Performed at [facility], [address] [place], [place] [address]    Culture (A)  Final    STAPHYLOCOCCUS CAPITIS CULTURE REINCUBATED FOR BETTER GROWTH Performed at [facility], [address], [place], [place] [address]    Report Status PENDING  Incomplete  SARS Coronavirus 2 by RT PCR (hospital order, performed in [facility] hospital lab) *cepheid single result test* Anterior Nasal Swab     Status: None   Collection Time: 04/21/23  2:18 AM   Specimen: Anterior Nasal Swab  Result Value Ref Range Status   SARS Coronavirus 2 by RT PCR NEGATIVE NEGATIVE Final    Comment: (NOTE) SARS-CoV-2 target nucleic acids are NOT DETECTED.  The SARS-CoV-2 RNA is generally detectable in upper and lower respiratory specimens during the acute phase of infection. The lowest concentration of SARS-CoV-2 viral copies this assay can detect is 250 copies / mL. A negative result does not preclude SARS-CoV-2 infection and should not be used as the sole basis for treatment or other patient management decisions.  A negative result may occur with improper specimen collection / handling, submission of specimen other than nasopharyngeal swab, presence of viral mutation(s) within the areas targeted by this assay, and inadequate number of viral copies (<250 copies / mL). A negative result must be combined with clinical observations, patient history, and epidemiological information.  Fact Sheet for Patients:   RoadLapTop.co.za  Fact Sheet for Healthcare Providers: http://kim-miller.com/  This test is not yet approved or  cleared by the [place] FDA and has been authorized for detection and/or diagnosis of SARS-CoV-2 by FDA  under an Emergency Use Authorization (EUA).  This EUA will remain in effect (meaning this test can be used) for the duration of the COVID-19 declaration under Section 564(b)(1) of the Act, 21 U.S.C. section 360bbb-3(b)(1), unless the authorization is terminated or revoked sooner.  Performed at [facility], [address] [place], [place] [address]   Blood Culture (routine x 2)     Status: Abnormal (Preliminary result)   Collection Time: 04/21/23  2:36 AM   Specimen: BLOOD  Result Value Ref Range Status   Specimen Description   Final    BLOOD  LEFT [facility] Performed at [facility], [address] [place], [place] [address]    Special Requests   Final    BOTTLES DRAWN AEROBIC AND ANAEROBIC Blood Culture adequate volume Performed at [facility], [address] [place], [place] [address]    Culture  Setup Time   Final    GRAM POSITIVE COCCI AEROBIC BOTTLE ONLY CRITICAL RESULT CALLED TO, READ BACK BY AND VERIFIED WITH: JASON ROBINS AT 2213 ON 04/21/23 BY SS    Culture (A)  Final    STAPHYLOCOCCUS  EPIDERMIDIS STAPHYLOCOCCUS CAPITIS CULTURE REINCUBATED FOR BETTER GROWTH Performed at [facility], [address], [place], [place] [address]    Report Status PENDING  Incomplete  Blood Culture ID Panel (Reflexed)     Status: Abnormal   Collection Time: 04/21/23  2:36 AM  Result Value Ref Range Status   Enterococcus faecalis NOT DETECTED NOT DETECTED Final   Enterococcus Faecium NOT DETECTED NOT DETECTED Final   Listeria monocytogenes NOT DETECTED NOT DETECTED Final   Staphylococcus species DETECTED (A) NOT DETECTED Final    Comment: CRITICAL RESULT CALLED TO, READ BACK BY AND VERIFIED WITH: JASON ROBINS AT 2213 ON 04/21/23 BY SS    Staphylococcus aureus (BCID) NOT DETECTED NOT DETECTED Final   Staphylococcus epidermidis DETECTED (A) NOT DETECTED Final    Comment: Methicillin (oxacillin) resistant coagulase negative staphylococcus. Possible blood  culture contaminant (unless isolated from more than one blood culture draw or clinical case suggests pathogenicity). No antibiotic treatment is indicated for blood  culture contaminants. CRITICAL RESULT CALLED TO, READ BACK BY AND VERIFIED WITH: JASON ROBINS AT 2213 ON 04/21/23 BY SS    Staphylococcus lugdunensis NOT DETECTED NOT DETECTED Final   Streptococcus species NOT DETECTED NOT DETECTED Final   Streptococcus agalactiae NOT DETECTED NOT DETECTED Final   Streptococcus pneumoniae NOT DETECTED NOT DETECTED Final   Streptococcus pyogenes NOT DETECTED NOT DETECTED Final   A.calcoaceticus-baumannii NOT DETECTED NOT DETECTED Final   Bacteroides fragilis NOT DETECTED NOT DETECTED Final   Enterobacterales NOT DETECTED NOT DETECTED Final   Enterobacter cloacae complex NOT DETECTED NOT DETECTED Final   Escherichia coli NOT DETECTED NOT DETECTED Final   Klebsiella aerogenes NOT DETECTED NOT DETECTED Final   Klebsiella oxytoca NOT DETECTED NOT DETECTED Final   Klebsiella pneumoniae NOT DETECTED NOT DETECTED Final   Proteus species NOT DETECTED NOT DETECTED Final   Salmonella species NOT DETECTED NOT DETECTED Final   Serratia marcescens NOT DETECTED NOT DETECTED Final   Haemophilus influenzae NOT DETECTED NOT DETECTED Final   Neisseria meningitidis NOT DETECTED NOT DETECTED Final   Pseudomonas aeruginosa NOT DETECTED NOT DETECTED Final   Stenotrophomonas maltophilia NOT DETECTED NOT DETECTED Final   Candida albicans NOT DETECTED NOT DETECTED Final   Candida auris NOT DETECTED NOT DETECTED Final   Candida glabrata NOT DETECTED NOT DETECTED Final   Candida krusei NOT DETECTED NOT DETECTED Final   Candida parapsilosis NOT DETECTED NOT DETECTED Final   Candida tropicalis NOT DETECTED NOT DETECTED Final   Cryptococcus neoformans/gattii NOT DETECTED NOT DETECTED Final   Methicillin resistance mecA/C DETECTED (A) NOT DETECTED Final    Comment: CRITICAL RESULT CALLED TO, READ BACK BY AND VERIFIED  WITH: JASON ROBINS AT 2213 ON 04/21/23 BY SS Performed at [facility], [address] [place], [place] [address]   Urine Culture (for pregnant, neutropenic or urologic patients or patients with an indwelling urinary catheter)     Status: None   Collection Time: 04/22/23  5:22 PM   Specimen: Urine, Clean Catch  Result Value Ref Range Status   Specimen Description   Final    URINE, CLEAN CATCH Performed at [facility], [address], [place], [place] [address]    Special Requests   Final    NONE Performed at [facility], [address] [place], [place] [address]    Culture   Final    NO GROWTH Performed at [facility], [address] [place], [place] [address]    Report Status  04/23/2023 FINAL  Final  Culture, blood (Routine X 2) w Reflex to ID Panel     Status: None (Preliminary result)   Collection Time: 04/23/23  6:10 AM   Specimen: BLOOD  Result Value Ref Range Status   Specimen Description BLOOD  LEFT HAND  Final   Special Requests   Final    BOTTLES DRAWN AEROBIC ONLY Blood Culture results may not be optimal due to an inadequate volume of blood received in culture bottles   Culture   Final    NO GROWTH < 24 HOURS Performed at [facility], [address], [place], [place] [address]    Report Status PENDING  Incomplete  Culture, blood (Routine X 2) w Reflex to ID Panel     Status: None (Preliminary result)   Collection Time: 04/23/23  6:21 AM   Specimen: BLOOD  Result Value Ref Range Status   Specimen Description BLOOD  RIGHT HAND  Final   Special Requests   Final    BOTTLES DRAWN AEROBIC AND ANAEROBIC Blood Culture adequate volume   Culture   Final    NO GROWTH < 24 HOURS Performed at [facility], [address], [place], [place] [address]    Report Status PENDING  Incomplete   Pertinent Lab.    Latest Ref Rng & Units 04/24/2023    4:30 AM 04/23/2023    5:35 AM 04/22/2023    5:56 AM  CBC  WBC 4.0 -  10.5 K/uL 32.4  44.4  32.8   Hemoglobin 13.0 - 17.0 g/dL 7.7  8.2  8.9   Hematocrit 39.0 - 52.0 % 23.6  25.7  27.0   Platelets 150 - 400 K/uL 431  477  575       Latest Ref Rng & Units 04/24/2023    4:30 AM 04/23/2023    5:35 AM 04/22/2023    5:56 AM  CMP  Glucose 70 - 99 mg/dL 098  119  147   BUN 6 - 20 mg/dL 36  31  29   Creatinine 0.61 - 1.24 mg/dL 8.29  5.62  1.30   Sodium 135 - 145 mmol/L 126  130  127   Potassium 3.5 - 5.1 mmol/L 3.2  3.3  3.3   Chloride 98 - 111 mmol/L 96  98  97   CO2 22 - 32 mmol/L 20  20  22    Calcium 8.9 - 10.3 mg/dL 7.5  7.8  7.7     Pertinent Imaging today Plain films and CT images have been personally visualized and interpreted; radiology reports have been reviewed. Decision making incorporated into the Impression /   ECHOCARDIOGRAM COMPLETE  Result Date: 04/22/2023    ECHOCARDIOGRAM REPORT   Patient Name:   Christopher Herman Date of Exam: 04/22/2023 Medical Rec #:  [id number]       Height:       65.0 in Accession #:    [id number]      Weight:       388.0 lb Date of Birth:  07-Jun-1976       BSA:          2.622 m Patient Age:    46 years        BP:           104/74 mmHg Patient Gender: M               HR:           113 bpm. Exam  Location:  [facility] Procedure: 2D Echo, Cardiac Doppler and Color Doppler Indications:     Shock R57.9  History:         Patient has no prior history of Echocardiogram examinations.                  Risk Factors:Sleep Apnea. GERD, palpitations.  Sonographer:     Cristela Blue Referring Phys:  [id number] Ezequiel Essex Diagnosing Phys: Debbe Odea MD IMPRESSIONS  1. Left ventricular ejection fraction, by estimation, is 35 to 40%. The left ventricle has moderately decreased function. The left ventricle demonstrates global hypokinesis. Left ventricular diastolic parameters are consistent with Grade I diastolic dysfunction (impaired relaxation).  2. Right ventricular systolic function is normal. The right ventricular size is normal.  3. The mitral  valve is normal in structure. No evidence of mitral valve regurgitation.  4. The aortic valve was not well visualized. Aortic valve regurgitation is not visualized.  5. The inferior vena cava is normal in size with greater than 50% respiratory variability, suggesting right atrial pressure of 3 mmHg. FINDINGS  Left Ventricle: Left ventricular ejection fraction, by estimation, is 35 to 40%. The left ventricle has moderately decreased function. The left ventricle demonstrates global hypokinesis. The left ventricular internal cavity size was normal in size. There is no left ventricular hypertrophy. Left ventricular diastolic parameters are consistent with Grade I diastolic dysfunction (impaired relaxation). Right Ventricle: The right ventricular size is normal. No increase in right ventricular wall thickness. Right ventricular systolic function is normal. Left Atrium: Left atrial size was normal in size. Right Atrium: Right atrial size was normal in size. Pericardium: There is no evidence of pericardial effusion. Mitral Valve: The mitral valve is normal in structure. No evidence of mitral valve regurgitation. Tricuspid Valve: The tricuspid valve is normal in structure. Tricuspid valve regurgitation is mild. Aortic Valve: The aortic valve was not well visualized. Aortic valve regurgitation is not visualized. Aortic valve mean gradient measures 1.0 mmHg. Aortic valve peak gradient measures 1.9 mmHg. Aortic valve area, by VTI measures 3.37 cm. Pulmonic Valve: The pulmonic valve was normal in structure. Pulmonic valve regurgitation is not visualized. Aorta: The aortic root is normal in size and structure. Venous: The inferior vena cava is normal in size with greater than 50% respiratory variability, suggesting right atrial pressure of 3 mmHg. IAS/Shunts: No atrial level shunt detected by color flow Doppler.  LEFT VENTRICLE PLAX 2D LVIDd:         4.80 cm   Diastology LVIDs:         3.30 cm   LV e' medial:    7.29 cm/s LV  PW:         1.30 cm   LV E/e' medial:  6.1 LV IVS:        1.10 cm   LV e' lateral:   6.20 cm/s LVOT diam:     2.30 cm   LV E/e' lateral: 7.2 LV SV:         30 LV SV Index:   12 LVOT Area:     4.15 cm  RIGHT VENTRICLE RV Basal diam:  2.80 cm RV Mid diam:    2.70 cm RV S prime:     22.00 cm/s TAPSE (M-mode): 1.9 cm LEFT ATRIUM           Index       RIGHT ATRIUM          Index LA diam:      2.10 cm  0.80 cm/m  RA Area:     9.10 cm LA Vol (A2C): 10.0 ml 3.80 ml/m  RA Volume:   16.20 ml 6.18 ml/m LA Vol (A4C): 12.8 ml 4.88 ml/m  AORTIC VALVE AV Area (Vmax):    3.49 cm AV Area (Vmean):   3.57 cm AV Area (VTI):     3.37 cm AV Vmax:           69.50 cm/s AV Vmean:          49.850 cm/s AV VTI:            0.090 m AV Peak Grad:      1.9 mmHg AV Mean Grad:      1.0 mmHg LVOT Vmax:         58.30 cm/s LVOT Vmean:        42.800 cm/s LVOT VTI:          0.073 m LVOT/AV VTI ratio: 0.81  AORTA Ao Root diam: 3.30 cm MITRAL VALVE               TRICUSPID VALVE MV Area (PHT): 11.49 cm   TR Peak grad:   20.4 mmHg MV Decel Time: 66 msec     TR Vmax:        226.00 cm/s MV E velocity: 44.50 cm/s MV A velocity: 88.60 cm/s  SHUNTS MV E/A ratio:  0.50        Systemic VTI:  0.07 m                            Systemic Diam: 2.30 cm Debbe Odea MD Electronically signed by Debbe Odea MD Signature Date/Time: 04/22/2023/2:31:46 PM    Final     I have personally spent at least 37 minutes involved in face-to-face and non-face-to-face activities for this patient on the day of the visit. Professional time spent includes the following activities: Preparing to see the patient (review of tests), Obtaining and/or reviewing separately obtained history (admission/discharge record), Performing a medically appropriate examination and/or evaluation , Ordering medications/tests/procedures, referring and communicating with other health care professionals, Documenting clinical information in the EMR, Independently interpreting results (not  separately reported), Communicating results to the patient/family/caregiver, Counseling and educating the patient/family/caregiver and Care coordination (not separately reported).   Plan d/w requesting provider as well as ID pharm D  Note: This document was prepared using dragon voice recognition software and may include unintentional dictation errors.   Electronically signed by:   Odette Fraction, MD Infectious Disease Physician [facility] Pager: [phone]

## 2023-04-24 NOTE — Consult Note (Signed)
Hematology/Oncology Consult note [facility] [phone] [phone]  Patient Care Team: Pcp, No as PCP - General Creig Hines, MD as Consulting Physician (Hematology and Oncology)   Name of the patient: Christopher Herman  [id number]  [birth year]    Reason for consult: Leukocytosis and anemia   Requesting physician: Dr. Belia Heman  Date of visit: 04/24/2023    History of presenting illness- Patient is a 47 year old male with history of severe obesity s/p bariatric surgery, obesity hypoventilation syndrome, hypertension, obstructive sleep apnea, chronic bilateral lymphedema who presented with worsening weakness fatigue and unintended weight loss.  He was treated for septic shock in the ICU with IV antibiotics requiring vasopressors.  Source was believed to be UTI.  Hematology is consulted for anemia and leukocytosis.  Patient has seen me about a year ago for his leukocytosis.  Back then he was close to 600 pounds and was unable to follow-up with me as an outpatient and come for any in person appointments.  He has had JAK2, CALR, MPL mutation testing done in the past as well as BCR-ABL testing done in the past which was negative.  He has had a flow cytometry done in the past which showed neutrophilia and monocytosis and left-sided maturation.  Plan was to follow this up conservatively but due to his inability to get out of the house this was not followed through.  At baseline patient cannot ambulate due to his obesity. He is bed bound and unable to get out of the house for any doctors appointments  ECOG PS- 4  Pain scale- 5   Review of systems- Review of Systems  Constitutional:  Positive for malaise/fatigue. Negative for chills, fever and weight loss.  HENT:  Negative for congestion, ear discharge and nosebleeds.   Eyes:  Negative for blurred vision.  Respiratory:  Negative for cough, hemoptysis, sputum production, shortness of breath and wheezing.    Cardiovascular:  Negative for chest pain, palpitations, orthopnea and claudication.  Gastrointestinal:  Negative for abdominal pain, blood in stool, constipation, diarrhea, heartburn, melena, nausea and vomiting.  Genitourinary:  Negative for dysuria, flank pain, frequency, hematuria and urgency.  Musculoskeletal:  Negative for back pain, joint pain and myalgias.  Skin:  Negative for rash.  Neurological:  Negative for dizziness, tingling, focal weakness, seizures, weakness and headaches.  Endo/Heme/Allergies:  Does not bruise/bleed easily.  Psychiatric/Behavioral:  Negative for depression and suicidal ideas. The patient does not have insomnia.     No Known Allergies  Patient Active Problem List   Diagnosis Date Noted   Malnutrition of moderate degree (HCC) 04/23/2023   Generalized weakness 04/23/2023   Ulcer of right ankle (HCC) 04/23/2023   Severe hypothyroidism 04/21/2023   Chronic diarrhea of unknown origin 07/11/2022   Muscle spasm of both lower legs 02/20/2022   Severe protein-calorie malnutrition (HCC) 02/20/2022   Diarrhea    Rhabdomyolysis 02/14/2022   Electrolyte abnormality 02/13/2022   AKI (acute kidney injury) (HCC) 02/12/2022   Sepsis (HCC) 02/11/2022   S/P IVC filter 09/11/2015   Cholelithiasis 09/11/2015   Anxiety 09/10/2015   Edema 09/10/2015   Knee pain 09/10/2015   Groin pain 09/10/2015   Vitamin D deficiency 09/10/2015   Obesity, morbid (HCC) 09/10/2015   Anemia, iron deficiency 09/25/2009   Pain in joint, multiple sites 09/22/2009   Essential (primary) hypertension 05/31/2009   Lipoma of lower extremity 05/31/2009   Palpitations 05/31/2009   GERD (gastroesophageal reflux disease) 05/31/2009   Obstructive sleep apnea 12/29/1998  Past Medical History:  Diagnosis Date   AKI (acute kidney injury) (HCC)    Cholelithiasis    Chronic acquired lymphedema    GERD (gastroesophageal reflux disease)    Helicobacter pylori infection 09/10/2015   History  of sepsis    Leg muscle spasm    Leukocytosis    Lipoma of both lower extremities    Palpitations 05/31/2009   Chronic, stable   Presence of IVC filter    Rhabdomyolysis    Sleep apnea    Tachycardia    Vitamin D deficiency      Past Surgical History:  Procedure Laterality Date   GASTRIC BYPASS  05/23/2012    Social History   Socioeconomic History   Marital status: Single    Spouse name: Not on file   Number of children: 0   Years of education: Not on file   Highest education level: Not on file  Occupational History   Occupation: Employed    Comment: Works as a Ship broker  Tobacco Use   Smoking status: Former   Smokeless tobacco: Never   Tobacco comments:    Quit smoking in 2010, smoked cigarettes, smoked for about 16 years; smoked less than 1/2 pack per day.  Substance and Sexual Activity   Alcohol use: No   Drug use: No   Sexual activity: Not Currently    Birth control/protection: None  Other Topics Concern   Not on file  Social History Narrative   Not on file   Social Determinants of Health   Financial Resource Strain: Not on file  Food Insecurity: No Food Insecurity (11/04/2022)   Hunger Vital Sign    Worried About Running Out of Food in the Last Year: Never true    Ran Out of Food in the Last Year: Never true  Transportation Needs: No Transportation Needs (11/04/2022)   PRAPARE - Administrator, Civil Service (Medical): No    Lack of Transportation (Non-Medical): No  Physical Activity: Inactive (11/04/2022)   Exercise Vital Sign    Days of Exercise per Week: 0 days    Minutes of Exercise per Session: 0 min  Stress: Not on file  Social Connections: Not on file  Intimate Partner Violence: Not on file     Family History  Problem Relation Age of Onset   Cancer Paternal Grandmother    Cancer Paternal Grandfather      Current Facility-Administered Medications:    0.9 % NaCl with KCl 40 mEq / L  infusion, , Intravenous,  Continuous, Jamelle Rushing L, MD   ascorbic acid (VITAMIN C) tablet 500 mg, 500 mg, Oral, BID, Kasa, Kurian, MD, 500 mg at 04/23/23 2042   cefTRIAXone (ROCEPHIN) 2 g in sodium chloride 0.9 % 100 mL IVPB, 2 g, Intravenous, Q1400, Odette Fraction, MD, Last Rate: 200 mL/hr at 04/23/23 1506, 2 g at 04/23/23 1506   Chlorhexidine Gluconate Cloth 2 % PADS 6 each, 6 each, Topical, Daily, Kasa, Kurian, MD, 6 each at 04/23/23 0925   enoxaparin (LOVENOX) injection 90 mg, 0.5 mg/kg, Subcutaneous, Q24H, Ouma, Hubbard Hartshorn, NP, 90 mg at 04/23/23 0926   feeding supplement (KATE FARMS STANDARD 1.4) liquid 325 mL, 325 mL, Oral, TID BM, Jamelle Rushing L, MD, 325 mL at 04/23/23 1510   folic acid (FOLVITE) tablet 1 mg, 1 mg, Oral, Daily, Kasa, Kurian, MD, 1 mg at 04/23/23 0925   hydrocortisone sodium succinate (SOLU-CORTEF) 100 MG injection 100 mg, 100 mg, Intravenous, Q8H, Ouma, Hubbard Hartshorn, NP, 100  mg at 04/24/23 0439   insulin aspart (novoLOG) injection 0-15 Units, 0-15 Units, Subcutaneous, TID WC, Leeroy Bock, MD, 2 Units at 04/23/23 1815   levothyroxine (SYNTHROID) tablet 100 mcg, 100 mcg, Oral, Q0600, Pilar Jarvis, MD, 100 mcg at 04/24/23 9604   lipase/protease/amylase (CREON) capsule 12,000 Units, 12,000 Units, Oral, TID AC, Leeroy Bock, MD   midodrine (PROAMATINE) tablet 10 mg, 10 mg, Oral, TID WC, Erin Fulling, MD, 10 mg at 04/23/23 1814   multivitamin liquid 15 mL, 15 mL, Oral, Daily, Belia Heman, Wallis Bamberg, MD, 15 mL at 04/23/23 0925   pantoprazole (PROTONIX) injection 40 mg, 40 mg, Intravenous, Q24H, Ezequiel Essex, NP, 40 mg at 04/23/23 1814   polyethylene glycol (MIRALAX / GLYCOLAX) packet 17 g, 17 g, Oral, Daily PRN, Ouma, Hubbard Hartshorn, NP   polyethylene glycol (MIRALAX / GLYCOLAX) packet 17 g, 17 g, Oral, Daily, Cordella Register A, RPH   potassium chloride SA (KLOR-CON M) CR tablet 40 mEq, 40 mEq, Oral, Once, Leeroy Bock, MD   thiamine (VITAMIN B1) tablet 100 mg, 100  mg, Oral, Daily, Belia Heman, Kurian, MD, 100 mg at 04/23/23 0925   vancomycin (VANCOREADY) IVPB 1750 mg/350 mL, 1,750 mg, Intravenous, Q12H, Aleda Grana, RPH, Last Rate: 175 mL/hr at 04/23/23 2212, 1,750 mg at 04/23/23 2212   vitamin A capsule 10,000 Units, 10,000 Units, Oral, Daily, Erin Fulling, MD, 10,000 Units at 04/23/23 1253   Vitamin D (Ergocalciferol) (DRISDOL) 1.25 MG (50000 UNIT) capsule 50,000 Units, 50,000 Units, Oral, Q7 days, Erin Fulling, MD, 50,000 Units at 04/23/23 5409   vitamin E capsule 400 Units, 400 Units, Oral, Daily, Erin Fulling, MD, 400 Units at 04/23/23 1255   zinc sulfate capsule 220 mg, 220 mg, Oral, Daily, Erin Fulling, MD, 220 mg at 04/23/23 0925   Physical exam:  Vitals:   04/23/23 2112 04/23/23 2335 04/24/23 0506 04/24/23 0806  BP: (!) 82/61 (!) 70/50 (!) 82/68 (!) 142/76  Pulse: 97 98 (!) 102 67  Resp: 18 20 20 20   Temp: 97.7 F (36.5 C) 97.8 F (36.6 C) 97.6 F (36.4 C) 97.6 F (36.4 C)  TempSrc:    Oral  SpO2: 100% 100% 100% 97%  Weight:      Height:       Physical Exam Constitutional:      Comments: Severe obesity  Cardiovascular:     Rate and Rhythm: Normal rate and regular rhythm.     Heart sounds: Normal heart sounds.  Pulmonary:     Effort: Pulmonary effort is normal.     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  Neurological:     Mental Status: He is alert and oriented to person, place, and time.           Latest Ref Rng & Units 04/24/2023    4:30 AM  CMP  Glucose 70 - 99 mg/dL 811   BUN 6 - 20 mg/dL 36   Creatinine 9.14 - 1.24 mg/dL 7.82   Sodium 956 - 213 mmol/L 126   Potassium 3.5 - 5.1 mmol/L 3.2   Chloride 98 - 111 mmol/L 96   CO2 22 - 32 mmol/L 20   Calcium 8.9 - 10.3 mg/dL 7.5       Latest Ref Rng & Units 04/24/2023  4:30 AM  CBC  WBC 4.0 - 10.5 K/uL 32.4   Hemoglobin 13.0 - 17.0 g/dL 7.7   Hematocrit 16.1 - 52.0 % 23.6   Platelets 150 - 400 K/uL 431     @IMAGES @  ECHOCARDIOGRAM COMPLETE  Result Date: 04/22/2023    ECHOCARDIOGRAM REPORT   Patient Name:   TYLIK TREESE Date of Exam: 04/22/2023 Medical Rec #:  [id number]       Height:       65.0 in Accession #:    [id number]      Weight:       388.0 lb Date of Birth:  1976-10-24       BSA:          2.622 m Patient Age:    46 years        BP:           104/74 mmHg Patient Gender: M               HR:           113 bpm. Exam Location:  [facility] Procedure: 2D Echo, Cardiac Doppler and Color Doppler Indications:     Shock R57.9  History:         Patient has no prior history of Echocardiogram examinations.                  Risk Factors:Sleep Apnea. GERD, palpitations.  Sonographer:     Cristela Blue Referring Phys:  [id number] Ezequiel Essex Diagnosing Phys: Debbe Odea MD IMPRESSIONS  1. Left ventricular ejection fraction, by estimation, is 35 to 40%. The left ventricle has moderately decreased function. The left ventricle demonstrates global hypokinesis. Left ventricular diastolic parameters are consistent with Grade I diastolic dysfunction (impaired relaxation).  2. Right ventricular systolic function is normal. The right ventricular size is normal.  3. The mitral valve is normal in structure. No evidence of mitral valve regurgitation.  4. The aortic valve was not well visualized. Aortic valve regurgitation is not visualized.  5. The inferior vena cava is normal in size with greater than 50% respiratory variability, suggesting right atrial pressure of 3 mmHg. FINDINGS  Left Ventricle: Left ventricular ejection fraction, by estimation, is 35 to 40%. The left ventricle has moderately decreased function. The left ventricle demonstrates global hypokinesis. The left ventricular internal cavity size was normal in size. There is no left ventricular hypertrophy. Left ventricular  diastolic parameters are consistent with Grade I diastolic dysfunction (impaired relaxation). Right Ventricle: The right ventricular size is normal. No increase in right ventricular wall thickness. Right ventricular systolic function is normal. Left Atrium: Left atrial size was normal in size. Right Atrium: Right atrial size was normal in size. Pericardium: There is no evidence of pericardial effusion. Mitral Valve: The mitral valve is normal in structure. No evidence of mitral valve regurgitation. Tricuspid Valve: The tricuspid valve is normal in structure. Tricuspid valve regurgitation is mild. Aortic Valve: The aortic valve was not well visualized. Aortic valve regurgitation is not visualized. Aortic valve mean gradient measures 1.0 mmHg. Aortic valve peak gradient measures 1.9 mmHg. Aortic valve area, by VTI measures 3.37 cm. Pulmonic Valve: The pulmonic valve was normal in structure. Pulmonic valve regurgitation is not visualized. Aorta: The aortic root is normal in size and structure. Venous: The inferior vena cava is normal in size with greater than 50% respiratory variability, suggesting right atrial pressure of 3 mmHg. IAS/Shunts: No atrial level shunt detected by color flow Doppler.  LEFT VENTRICLE  PLAX 2D LVIDd:         4.80 cm   Diastology LVIDs:         3.30 cm   LV e' medial:    7.29 cm/s LV PW:         1.30 cm   LV E/e' medial:  6.1 LV IVS:        1.10 cm   LV e' lateral:   6.20 cm/s LVOT diam:     2.30 cm   LV E/e' lateral: 7.2 LV SV:         30 LV SV Index:   12 LVOT Area:     4.15 cm  RIGHT VENTRICLE RV Basal diam:  2.80 cm RV Mid diam:    2.70 cm RV S prime:     22.00 cm/s TAPSE (M-mode): 1.9 cm LEFT ATRIUM           Index       RIGHT ATRIUM          Index LA diam:      2.10 cm 0.80 cm/m  RA Area:     9.10 cm LA Vol (A2C): 10.0 ml 3.80 ml/m  RA Volume:   16.20 ml 6.18 ml/m LA Vol (A4C): 12.8 ml 4.88 ml/m  AORTIC VALVE AV Area (Vmax):    3.49 cm AV Area (Vmean):   3.57 cm AV Area (VTI):      3.37 cm AV Vmax:           69.50 cm/s AV Vmean:          49.850 cm/s AV VTI:            0.090 m AV Peak Grad:      1.9 mmHg AV Mean Grad:      1.0 mmHg LVOT Vmax:         58.30 cm/s LVOT Vmean:        42.800 cm/s LVOT VTI:          0.073 m LVOT/AV VTI ratio: 0.81  AORTA Ao Root diam: 3.30 cm MITRAL VALVE               TRICUSPID VALVE MV Area (PHT): 11.49 cm   TR Peak grad:   20.4 mmHg MV Decel Time: 66 msec     TR Vmax:        226.00 cm/s MV E velocity: 44.50 cm/s MV A velocity: 88.60 cm/s  SHUNTS MV E/A ratio:  0.50        Systemic VTI:  0.07 m                            Systemic Diam: 2.30 cm Debbe Odea MD Electronically signed by Debbe Odea MD Signature Date/Time: 04/22/2023/2:31:46 PM    Final    US THYROID  Result Date: 04/22/2023 CLINICAL DATA:  Hypothyroidism EXAM: THYROID ULTRASOUND TECHNIQUE: Ultrasound examination of the thyroid gland and adjacent soft tissues was performed. COMPARISON:  None Available. FINDINGS: Parenchymal Echotexture: Moderately heterogenous Isthmus: 0.4 cm thickness Right lobe: 4 x 2.2 x 1.7 cm Left lobe: 4.3 x 1.7 x 1.6 cm _________________________________________________________ Estimated total number of nodules >/= 1 cm: 0 Number of spongiform nodules >/=  2 cm not described below (TR1): 0 Number of mixed cystic and solid nodules >/= 1.5 cm not described below (TR2): 0 _________________________________________________________ 0.5 cm hypoechoic nodular calcifications, inferior right lobe; This nodule does NOT meet TI-RADS criteria for biopsy or  dedicated follow-up. 0.4 cm peripherally calcified nodule, superior left lobe; This nodule does NOT meet TI-RADS criteria for biopsy or dedicated follow-up. No regional cervical adenopathy identified. IMPRESSION: Normal-sized thyroid.  No worrisome nodule. The above is in keeping with the ACR TI-RADS recommendations - J Am Coll Radiol 2017;14:587-595. Electronically Signed   By: Corlis Leak M.D.   On: 04/22/2023 07:34   CT  PELVIS WO CONTRAST  Result Date: 04/21/2023 CLINICAL DATA:  Weakness, weight loss. EXAM: CT PELVIS WITHOUT CONTRAST TECHNIQUE: Multidetector CT imaging of the pelvis was performed following the standard protocol without intravenous contrast. Unfortunately, these images are significantly limited due to attenuation artifacts secondary to body habitus. RADIATION DOSE REDUCTION: This exam was performed according to the departmental dose-optimization program which includes automated exposure control, adjustment of the mA and/or kV according to patient size and/or use of iterative reconstruction technique. COMPARISON:  None Available. FINDINGS: Urinary Tract: Urinary bladder is decompressed secondary to Foley catheter. Bowel:  Visualized bowel loops are unremarkable. Vascular/Lymphatic: No pathologically enlarged lymph nodes. No significant vascular abnormality seen. IVC filter is noted in infrarenal position. Reproductive: Prostate is not well visualized due to attenuation artifact due to body habitus. Other:  No definite evidence of ascites or hernia. Musculoskeletal: No suspicious bone lesions identified. No fracture or lytic destruction is noted. IMPRESSION: Severely limited exam due to body attenuation artifact secondary to body habitus. No definite acute abnormality is noted. Electronically Signed   By: Lupita Raider M.D.   On: 04/21/2023 14:00   DG Chest Port 1 View  Result Date: 04/21/2023 CLINICAL DATA:  Central line placement. EXAM: PORTABLE CHEST 1 VIEW COMPARISON:  Same day. FINDINGS: Interval placement of right internal jugular catheter with distal tip in expected position of cavoatrial junction. However, lung apices were not included in field of view and therefore pneumothorax cannot be excluded on the basis of this exam. IMPRESSION: Right internal jugular catheter tip seen in expected position of cavoatrial junction. This exam is limited as lung apices were not included in field-of-view and  therefore pneumothorax cannot be excluded on the basis of this exam. Electronically Signed   By: Lupita Raider M.D.   On: 04/21/2023 11:10   [place] EKG SITE RITE  Result Date: 04/21/2023 If Site Rite image not attached, placement could not be confirmed due to current cardiac rhythm.  DG Ankle 2 Views Right  Result Date: 04/21/2023 CLINICAL DATA:  Sepsis, right lateral ankle ulceration EXAM: RIGHT ANKLE - 2 VIEW COMPARISON:  None Available. FINDINGS: Osseous structures are diffusely osteopenic. No acute fracture or dislocation. No osseous erosions or abnormal periosteal reaction. Superior calcaneal spur. No ankle effusion. Diffuse subcutaneous edema involving the visualized right ankle and right hindfoot. IMPRESSION: 1. Diffuse subcutaneous edema. No acute fracture or dislocation. Electronically Signed   By: Helyn Numbers M.D.   On: 04/21/2023 03:39   DG Chest Port 1 View  Result Date: 04/21/2023 CLINICAL DATA:  Weakness, hypotension and diaphoresis. EXAM: PORTABLE CHEST 1 VIEW COMPARISON:  February 10, 2022 FINDINGS: The heart size and mediastinal contours are within normal limits. Both lungs are clear. The visualized skeletal structures are unremarkable. IMPRESSION: No active disease. Electronically Signed   By: Aram Candela M.D.   On: 04/21/2023 02:31    Assessment and plan- Patient is a 47 y.o. male admitted for septic shock secondary to possible UTI.Hematology consulted for leukocytosis thrombocytosis and anemia  Leukocytosis and thrombocytosisThis is likely reactive in the setting of acute infection although he has  had some chronic leukocytosis in the past as well.  Differential has mainly shown neutrophilia and at times monocytosis.  BCR-ABL, flow cytometry, CALR and MPL mutation testing has been negative in the past.  There has been no consistent increase in his white cell count which has fluctuated between 20s to 40s.  Also his platelet count has been fluctuating mostly in the 400s.  I  feel this is likely reactive and does not require a bone marrow biopsy at this time.   Normocytic anemia: Hemoglobin during this admission has been between 7-8 although his baseline hemoglobin runs between 9-10 likely secondary to chronic disease.  He has had some anemia workup done during this hospitalization including iron studies which were indicated of anemia of chronic disease.  B12 levels were elevated and folate levels normal.  I am doing some additional blood work including myeloma panel serum free light chains at this time along with hemolysis workup including LDH reticulocyte count and haptoglobin.  He was noted to have mildly low zinc and copper levels.  Over-the-counter supplementation would be reasonable for that at this time  Due to his severe obesity, he cannot undergo bone marrow biopsy and it is not indicated presently. Unfortunately due to him being bed bound, he cannot have outpatient f/u with me either for his anemia. He is a FULL CODE   Visit Diagnosis 1. Sepsis, due to unspecified organism, unspecified whether acute organ dysfunction present (HCC)   2. Generalized weakness   3. Lower limb ulcer, ankle, right, with unspecified severity (HCC)   4. Hypothyroidism, unspecified type     Dr. Owens Shark, MD, MPH [facility] [phone] 04/24/2023

## 2023-04-24 NOTE — Progress Notes (Signed)
PROGRESS NOTE  Christopher Herman    DOB: Sep 20, 1976, 47 y.o.  UEA:[id number]    Code Status: Full Code   DOA: 04/21/2023   LOS: 3   Brief hospital course  Christopher Herman is a 47 y.o. male with a PMH significant for severe obesity s/p bariatric surgery, OSA, hypoventilation syndrome, HTN, GERD, cholelithiasis, bilateral extremity lipoma, chronic lymphedema status post IVC filter placement, IDA.  They presented from home to the ED on 04/21/2023 with generalized weakness, extreme fatigue, unintended weight loss, disuse of upper extremities and poor p.o. intake  x a few days. He is chronically bed bound and malnourished.   In the ED, it was found that they had BP 100/73  Pulse (!) 110  Temp 98.2 F (36.8 C) (Oral)  Resp 15  Ht 5\' 5"  (1.651 m)  Wt (!) 185.4 kg  SpO2 100%  BMI 68.02 kg/m .  Significant findings included WBC 27.5, hgb 9.5, platelet 249, Cr 0.74, Na+ 130, K+ 3.2. SARS-CoV-2 PCR negative, Influenza PCR> negative. Blood and urine cultures pending.  Chest Xray> no active cardiopulmonary process   They were initially treated with vancomycin, cefepime, metronidazole.  They were admitted to the ICU for septic shock with hypotension resistant to bolus and required vasopressors.   04/24/23 -guarded/stable  Assessment & Plan  Principal Problem:   Severe hypothyroidism Active Problems:   Malnutrition of moderate degree (HCC)   Generalized weakness   Ulcer of right ankle (HCC)  SEPTIC shock SOURCE-UTI- has been off vasopressors for <48 hours and maintaining MAP ~65. MRSE in 2/2 BxCx - continue midodrine 10mg  TID - requiring IV fluid support to maintain blood pressure - continuous tele - continue Abx- ID on board, appreciate your recs  - CTX  - vancomycin - follow cultures - continue IV fluids - monitor CBC   Severe Hypothyroidism- Last TSH, Free T4 check was 11 years ago with normal TSH Presents w/ generalized weakness, hyponatremia, hypothermia, hypotension with  vasopressor-refractory shock TSH: 9.134 Free T4:1.34 -s/p IV Synthroid 250 mcg followed by 100 mcg daily. Repeat TSH on 4/25 is 1.393 - hydrocortisone 100 mg IV every 8 for possible concomitant adrenal insufficiency   AKI- Cr 1.06 today, up from baseline of about 0.6 Hyponatremia-worsened today 130>126.  hypokalemia- K+ 3.3>3.2 - added IV fluids back on and consulted nephrology, appreciate your recs - renal US pending -Follow BMP -treat hypothyroidism as above - removing foley - strict I/O  Thrombocytosis  Significant leukocytosis and rising despite supportive care and antibiotics. WBC 27.5>32.8>44.4>32.4. diff showing abs neutrophil predominance. Platelets 477>431. Morphology unremarkable on pathology report. - serial CBC - heme/onc consult   Obesity  malnutrition s/p bariatric surgery  chronic diarrhea - RD consult, appreciate your recs - start creon - imodium scheduled and adding rectal tube. Having >20 BMs per day - continue supplements - PT/OT  Sinus tachycardia  prolonged Qtc- 501. HR in 120-130s and increased up to 150 with straining for BM and is improving with IVF hydration- now low 100s. ECG without ischemic changes.  - avoid Qtc prolonging agents  Bed-bound - PT/OT evaluation and therapy - frequent turning   Body mass index is 66.15 kg/m.  VTE ppx: lovenox  Diet:     Diet   Diet regular Room service appropriate? Yes with Assist; Fluid consistency: Thin   Consultants: ID  Subjective 04/24/23    Pt reports feeling unwell but is still in good spirits. He states he's having almost constant stream of diarrhea. Denies abdominal pain,  nausea. Denies chest pain, flutter, SOB.   Objective   Vitals:   04/23/23 1910 04/23/23 2112 04/23/23 2335 04/24/23 0506  BP: (!) 77/58 (!) 82/61 (!) 70/50 (!) 82/68  Pulse: (!) 102 97 98 (!) 102  Resp: 16 18 20 20   Temp: 98 F (36.7 C) 97.7 F (36.5 C) 97.8 F (36.6 C) 97.6 F (36.4 C)  TempSrc:      SpO2: 99% 100%  100% 100%  Weight:      Height:        Intake/Output Summary (Last 24 hours) at 04/24/2023 0742 Last data filed at 04/24/2023 0600 Gross per 24 hour  Intake 750.38 ml  Output 100 ml  Net 650.38 ml    Filed Weights   04/21/23 0410 04/22/23 0500 04/23/23 0600  Weight: (!) 185.4 kg (!) 176 kg (!) 180.3 kg    Physical Exam:  General: awake, alert, NAD. Appears tired HEENT: atraumatic, clear conjunctiva, anicteric sclera, MMM, hearing grossly normal Respiratory: normal respiratory effort. CTAB Cardiovascular: quick capillary refill, normal S1/S2, RRR, no JVD, murmurs Gastrointestinal: obese, soft, NT, ND Nervous: A&O x3. no gross focal neurologic deficits, low volume speech Extremities: no edema, low muscle tone. LE with chronic edema changes Skin: dry, intact, normal temperature, normal color. No rashes, lesions or ulcers on exposed skin Psychiatry: normal mood, congruent affect  Labs   I have personally reviewed the following labs and imaging studies CBC    Component Value Date/Time   WBC 32.4 (H) 04/24/2023 0430   RBC 2.79 (L) 04/24/2023 0430   HGB 7.7 (L) 04/24/2023 0430   HGB 12.8 (L) 02/11/2012 1227   HCT 23.6 (L) 04/24/2023 0430   HCT 39.3 (L) 02/11/2012 1227   PLT 431 (H) 04/24/2023 0430   PLT 333 02/11/2012 1227   MCV 84.6 04/24/2023 0430   MCV 83 02/11/2012 1227   MCH 27.6 04/24/2023 0430   MCHC 32.6 04/24/2023 0430   RDW 19.4 (H) 04/24/2023 0430   RDW 16.0 (H) 02/11/2012 1227   LYMPHSABS 1.9 04/24/2023 0430   LYMPHSABS 2.2 02/11/2012 1227   MONOABS 0.7 04/24/2023 0430   MONOABS 0.5 02/11/2012 1227   EOSABS 0.0 04/24/2023 0430   EOSABS 0.1 02/11/2012 1227   BASOSABS 0.0 04/24/2023 0430   BASOSABS 0.0 02/11/2012 1227      Latest Ref Rng & Units 04/24/2023    4:30 AM 04/23/2023    5:35 AM 04/22/2023    5:56 AM  BMP  Glucose 70 - 99 mg/dL 742  595  638   BUN 6 - 20 mg/dL 36  31  29   Creatinine 0.61 - 1.24 mg/dL 7.56  4.33  2.95   Sodium 135 - 145 mmol/L  126  130  127   Potassium 3.5 - 5.1 mmol/L 3.2  3.3  3.3   Chloride 98 - 111 mmol/L 96  98  97   CO2 22 - 32 mmol/L 20  20  22    Calcium 8.9 - 10.3 mg/dL 7.5  7.8  7.7     ECHOCARDIOGRAM COMPLETE  Result Date: 04/22/2023    ECHOCARDIOGRAM REPORT   Patient Name:   Christopher Herman Date of Exam: 04/22/2023 Medical Rec #:  [id number]       Height:       65.0 in Accession #:    [id number]      Weight:       388.0 lb Date of Birth:  1976-05-25       BSA:  2.622 m Patient Age:    46 years        BP:           104/74 mmHg Patient Gender: M               HR:           113 bpm. Exam Location:  [facility] Procedure: 2D Echo, Cardiac Doppler and Color Doppler Indications:     Shock R57.9  History:         Patient has no prior history of Echocardiogram examinations.                  Risk Factors:Sleep Apnea. GERD, palpitations.  Sonographer:     Cristela Blue Referring Phys:  [id number] Ezequiel Essex Diagnosing Phys: Debbe Odea MD IMPRESSIONS  1. Left ventricular ejection fraction, by estimation, is 35 to 40%. The left ventricle has moderately decreased function. The left ventricle demonstrates global hypokinesis. Left ventricular diastolic parameters are consistent with Grade I diastolic dysfunction (impaired relaxation).  2. Right ventricular systolic function is normal. The right ventricular size is normal.  3. The mitral valve is normal in structure. No evidence of mitral valve regurgitation.  4. The aortic valve was not well visualized. Aortic valve regurgitation is not visualized.  5. The inferior vena cava is normal in size with greater than 50% respiratory variability, suggesting right atrial pressure of 3 mmHg. FINDINGS  Left Ventricle: Left ventricular ejection fraction, by estimation, is 35 to 40%. The left ventricle has moderately decreased function. The left ventricle demonstrates global hypokinesis. The left ventricular internal cavity size was normal in size. There is no left ventricular hypertrophy.  Left ventricular diastolic parameters are consistent with Grade I diastolic dysfunction (impaired relaxation). Right Ventricle: The right ventricular size is normal. No increase in right ventricular wall thickness. Right ventricular systolic function is normal. Left Atrium: Left atrial size was normal in size. Right Atrium: Right atrial size was normal in size. Pericardium: There is no evidence of pericardial effusion. Mitral Valve: The mitral valve is normal in structure. No evidence of mitral valve regurgitation. Tricuspid Valve: The tricuspid valve is normal in structure. Tricuspid valve regurgitation is mild. Aortic Valve: The aortic valve was not well visualized. Aortic valve regurgitation is not visualized. Aortic valve mean gradient measures 1.0 mmHg. Aortic valve peak gradient measures 1.9 mmHg. Aortic valve area, by VTI measures 3.37 cm. Pulmonic Valve: The pulmonic valve was normal in structure. Pulmonic valve regurgitation is not visualized. Aorta: The aortic root is normal in size and structure. Venous: The inferior vena cava is normal in size with greater than 50% respiratory variability, suggesting right atrial pressure of 3 mmHg. IAS/Shunts: No atrial level shunt detected by color flow Doppler.  LEFT VENTRICLE PLAX 2D LVIDd:         4.80 cm   Diastology LVIDs:         3.30 cm   LV e' medial:    7.29 cm/s LV PW:         1.30 cm   LV E/e' medial:  6.1 LV IVS:        1.10 cm   LV e' lateral:   6.20 cm/s LVOT diam:     2.30 cm   LV E/e' lateral: 7.2 LV SV:         30 LV SV Index:   12 LVOT Area:     4.15 cm  RIGHT VENTRICLE RV Basal diam:  2.80 cm RV Mid  diam:    2.70 cm RV S prime:     22.00 cm/s TAPSE (M-mode): 1.9 cm LEFT ATRIUM           Index       RIGHT ATRIUM          Index LA diam:      2.10 cm 0.80 cm/m  RA Area:     9.10 cm LA Vol (A2C): 10.0 ml 3.80 ml/m  RA Volume:   16.20 ml 6.18 ml/m LA Vol (A4C): 12.8 ml 4.88 ml/m  AORTIC VALVE AV Area (Vmax):    3.49 cm AV Area (Vmean):   3.57 cm  AV Area (VTI):     3.37 cm AV Vmax:           69.50 cm/s AV Vmean:          49.850 cm/s AV VTI:            0.090 m AV Peak Grad:      1.9 mmHg AV Mean Grad:      1.0 mmHg LVOT Vmax:         58.30 cm/s LVOT Vmean:        42.800 cm/s LVOT VTI:          0.073 m LVOT/AV VTI ratio: 0.81  AORTA Ao Root diam: 3.30 cm MITRAL VALVE               TRICUSPID VALVE MV Area (PHT): 11.49 cm   TR Peak grad:   20.4 mmHg MV Decel Time: 66 msec     TR Vmax:        226.00 cm/s MV E velocity: 44.50 cm/s MV A velocity: 88.60 cm/s  SHUNTS MV E/A ratio:  0.50        Systemic VTI:  0.07 m                            Systemic Diam: 2.30 cm Debbe Odea MD Electronically signed by Debbe Odea MD Signature Date/Time: 04/22/2023/2:31:46 PM    Final     Disposition Plan & Communication  Patient status: Inpatient  Admitted From: Home Planned disposition location: TBD Anticipated discharge date: TBD pending clinical improvement   Family Communication: none at bedside    Author: Leeroy Bock, DO [facility] 04/24/2023, 7:42 AM   Available by Epic secure chat 7AM-7PM. If 7PM-7AM, please contact night-coverage.  [facility] contact information found on [email or web address].

## 2023-04-24 NOTE — Progress Notes (Signed)
   04/23/23 2335  Assess: MEWS Score  Temp 97.8 F (36.6 C)  BP (!) 70/50  MAP (mmHg) (!) 58  Pulse Rate 98  Resp 20  SpO2 100 %  O2 Device Room Air  Assess: MEWS Score  MEWS Temp 0  MEWS Systolic 2  MEWS Pulse 0  MEWS RR 0  MEWS LOC 0  MEWS Score 2  MEWS Score Color Yellow  Assess: if the MEWS score is Yellow or Red  Were vital signs taken at a resting state? Yes  Focused Assessment No change from prior assessment  Does the patient meet 2 or more of the SIRS criteria? No  MEWS guidelines implemented  No, previously yellow, continue vital signs every 4 hours  Notify: Charge Nurse/RN  Name of Charge Nurse/RN Notified Alex, RN  Provider Notification  Provider Name/Title Manuela Schwartz, NP  Date Provider Notified 04/24/23  Time Provider Notified 0045  Method of Notification Page  Notification Reason Other (Comment) (Vital Signs lower than earlier wanted to see if a PRN dose of Midodrine could be ordered.)  Provider response No new orders  Date of Provider Response 04/23/23  Time of Provider Response 2342  Assess: SIRS CRITERIA  SIRS Temperature  0  SIRS Pulse 1  SIRS Respirations  0  SIRS WBC 0  SIRS Score Sum  1

## 2023-04-24 NOTE — Progress Notes (Signed)
PHARMACY CONSULT NOTE  Pharmacy Consult for Electrolyte Monitoring and Replacement   Recent Labs: Potassium (mmol/L)  Date Value  04/24/2023 3.2 (L)  02/11/2012 3.8   Magnesium (mg/dL)  Date Value  19/14/7829 2.0   Calcium (mg/dL)  Date Value  56/21/3086 7.5 (L)   Calcium, Total (mg/dL)  Date Value  57/84/6962 8.7   Albumin (g/dL)  Date Value  95/28/4132 2.2 (L)  02/11/2012 3.3 (L)   Phosphorus (mg/dL)  Date Value  44/12/270 2.9   Sodium (mmol/L)  Date Value  04/24/2023 126 (L)  02/11/2012 140   Assessment: 47 year old male admitted to CCU with septic shock and severe hypothyroidism. PMH includes severe obesity s/p bariatric surgery, OSA, hypoventilation syndrome, HTN, GERD, cholelithiasis, bilateral extremity lipoma, chronic lymphedema status post IVC filter placement, IDA.  Diet: Regular MIVF: NS + 40 mEq K/L at 125 cc/hr (started 4/26 AM) Scr with slight uptrend  Goal of Therapy:  Electrolytes within normal limits  Plan:  --Na 126, persistent / stable. Appears fluid non-responsive. Does have heart failure on ECHO. Defer management to primary team --K 3.2, Kcl 40 mEq PO x 1 and will receive 120 mEq K+ from maintenance fluids over 24h --Patient care transferred from [facility] to [facility]. Will discontinue electrolyte consult at this time. Defer further ordering of labs and electrolyte replacement to primary team --Pharmacy will continue to follow along peripherally  Tressie Ellis 04/24/2023 9:11 AM

## 2023-04-25 DIAGNOSIS — E039 Hypothyroidism, unspecified: Secondary | ICD-10-CM | POA: Diagnosis not present

## 2023-04-25 DIAGNOSIS — A419 Sepsis, unspecified organism: Secondary | ICD-10-CM | POA: Diagnosis not present

## 2023-04-25 DIAGNOSIS — R531 Weakness: Secondary | ICD-10-CM | POA: Diagnosis not present

## 2023-04-25 DIAGNOSIS — N179 Acute kidney failure, unspecified: Secondary | ICD-10-CM | POA: Diagnosis not present

## 2023-04-25 LAB — CBC WITH DIFFERENTIAL/PLATELET
Abs Immature Granulocytes: 0.92 10*3/uL — ABNORMAL HIGH (ref 0.00–0.07)
Basophils Absolute: 0.1 10*3/uL (ref 0.0–0.1)
Basophils Relative: 0 %
Eosinophils Absolute: 0 10*3/uL (ref 0.0–0.5)
Eosinophils Relative: 0 %
HCT: 25.8 % — ABNORMAL LOW (ref 39.0–52.0)
Hemoglobin: 8.2 g/dL — ABNORMAL LOW (ref 13.0–17.0)
Immature Granulocytes: 3 %
Lymphocytes Relative: 6 %
Lymphs Abs: 2 10*3/uL (ref 0.7–4.0)
MCH: 27.2 pg (ref 26.0–34.0)
MCHC: 31.8 g/dL (ref 30.0–36.0)
MCV: 85.4 fL (ref 80.0–100.0)
Monocytes Absolute: 1.1 10*3/uL — ABNORMAL HIGH (ref 0.1–1.0)
Monocytes Relative: 3 %
Neutro Abs: 29.1 10*3/uL — ABNORMAL HIGH (ref 1.7–7.7)
Neutrophils Relative %: 88 %
Platelets: 514 10*3/uL — ABNORMAL HIGH (ref 150–400)
RBC: 3.02 MIL/uL — ABNORMAL LOW (ref 4.22–5.81)
RDW: 20 % — ABNORMAL HIGH (ref 11.5–15.5)
Smear Review: UNDETERMINED
WBC: 33.2 10*3/uL — ABNORMAL HIGH (ref 4.0–10.5)
nRBC: 0 % (ref 0.0–0.2)

## 2023-04-25 LAB — BASIC METABOLIC PANEL
Anion gap: 11 (ref 5–15)
BUN: 37 mg/dL — ABNORMAL HIGH (ref 6–20)
CO2: 16 mmol/L — ABNORMAL LOW (ref 22–32)
Calcium: 7.7 mg/dL — ABNORMAL LOW (ref 8.9–10.3)
Chloride: 101 mmol/L (ref 98–111)
Creatinine, Ser: 1.27 mg/dL — ABNORMAL HIGH (ref 0.61–1.24)
GFR, Estimated: >60 mL/min (ref >60)
Glucose, Bld: 114 mg/dL — ABNORMAL HIGH (ref 70–99)
Potassium: 4 mmol/L (ref 3.5–5.1)
Sodium: 128 mmol/L — ABNORMAL LOW (ref 135–145)

## 2023-04-25 LAB — RETICULOCYTES
Immature Retic Fract: 24.5 % — ABNORMAL HIGH (ref 2.3–15.9)
RBC.: 3.05 MIL/uL — ABNORMAL LOW (ref 4.22–5.81)
Retic Count, Absolute: 46.4 10*3/uL (ref 19.0–186.0)
Retic Ct Pct: 1.5 % (ref 0.4–3.1)

## 2023-04-25 LAB — VANCOMYCIN, RANDOM: Vancomycin Rm: >60 ug/mL

## 2023-04-25 LAB — GLUCOSE, CAPILLARY
Glucose-Capillary: 116 mg/dL — ABNORMAL HIGH (ref 70–99)
Glucose-Capillary: 116 mg/dL — ABNORMAL HIGH (ref 70–99)
Glucose-Capillary: 108 mg/dL — ABNORMAL HIGH (ref 70–99)
Glucose-Capillary: 109 mg/dL — ABNORMAL HIGH (ref 70–99)

## 2023-04-25 LAB — LACTATE DEHYDROGENASE: LDH: 133 U/L (ref 98–192)

## 2023-04-25 LAB — PHOSPHORUS: Phosphorus: 3 mg/dL (ref 2.5–4.6)

## 2023-04-25 LAB — CULTURE, BLOOD (ROUTINE X 2): Special Requests: ADEQUATE

## 2023-04-25 MED ORDER — PANTOPRAZOLE SODIUM 40 MG IV SOLR
40.0000 mg | INTRAVENOUS | Status: DC
  Administered 2023-04-25 – 2023-05-04 (×10): 40 mg via INTRAVENOUS
  Filled 2023-04-25 (×10): qty 10

## 2023-04-25 MED ORDER — ALBUMIN HUMAN 25 % IV SOLN
12.5000 g | Freq: Every day | INTRAVENOUS | Status: AC
  Administered 2023-04-25 – 2023-04-27 (×3): 12.5 g via INTRAVENOUS
  Filled 2023-04-25 (×3): qty 50

## 2023-04-25 MED ORDER — HYDROCORTISONE SOD SUC (PF) 100 MG IJ SOLR
100.0000 mg | Freq: Every day | INTRAMUSCULAR | Status: DC
  Administered 2023-04-26 – 2023-05-01 (×6): 100 mg via INTRAVENOUS
  Filled 2023-04-25 (×6): qty 2

## 2023-04-25 MED ORDER — PANTOPRAZOLE SODIUM 40 MG PO TBEC: 40.0000 mg | DELAYED_RELEASE_TABLET | Freq: Every day | ORAL | Status: DC

## 2023-04-25 NOTE — Progress Notes (Signed)
Results for orders placed or performed during the hospital encounter of 04/21/23  Blood Culture (routine x 2)     Status: Abnormal (Preliminary result)   Collection Time: 04/21/23  2:02 AM   Specimen: BLOOD  Result Value Ref Range Status   Specimen Description   Final    BLOOD RIGHT ARM Performed at [facility], [address] [place], [place] [address]    Special Requests   Final    BOTTLES DRAWN AEROBIC AND ANAEROBIC Blood Culture adequate volume Performed at [facility], [address], [place], [place] [address]    Culture  Setup Time   Final    GRAM POSITIVE COCCI IN BOTH AEROBIC AND ANAEROBIC BOTTLES CRITICAL VALUE NOTED.  VALUE IS CONSISTENT WITH PREVIOUSLY REPORTED AND CALLED VALUE. Performed at [facility], [address] [place], [place] [address]    Culture (A)  Final    STAPHYLOCOCCUS CAPITIS CONFIRMATION OF SUSCEPTIBILITIES IN PROGRESS CULTURE REINCUBATED FOR BETTER GROWTH Performed at [facility], [address], [place], [place] [address]    Report Status PENDING  Incomplete  SARS Coronavirus 2 by RT PCR (hospital order, performed in [facility] hospital lab) *cepheid single result test* Anterior Nasal Swab     Status: None   Collection Time: 04/21/23  2:18 AM   Specimen: Anterior Nasal Swab  Result Value Ref Range Status   SARS Coronavirus 2 by RT PCR NEGATIVE NEGATIVE Final    Comment: (NOTE) SARS-CoV-2 target nucleic acids are NOT DETECTED.  The SARS-CoV-2 RNA is generally detectable in upper and lower respiratory specimens during the acute phase of infection. The lowest concentration of SARS-CoV-2 viral copies this assay can detect is 250 copies / mL. A negative result does not preclude SARS-CoV-2 infection and should not be used as the sole basis for treatment or other patient management decisions.  A negative result may occur with improper specimen collection / handling, submission of specimen other than  nasopharyngeal swab, presence of viral mutation(s) within the areas targeted by this assay, and inadequate number of viral copies (<250 copies / mL). A negative result must be combined with clinical observations, patient history, and epidemiological information.  Fact Sheet for Patients:   RoadLapTop.co.za  Fact Sheet for Healthcare Providers: http://kim-miller.com/  This test is not yet approved or  cleared by the [place] FDA and has been authorized for detection and/or diagnosis of SARS-CoV-2 by FDA under an Emergency Use Authorization (EUA).  This EUA will remain in effect (meaning this test can be used) for the duration of the COVID-19 declaration under Section 564(b)(1) of the Act, 21 U.S.C. section 360bbb-3(b)(1), unless the authorization is terminated or revoked sooner.  Performed at [facility], [address] [place], [place] [address]   Blood Culture (routine x 2)     Status: Abnormal (Preliminary result)   Collection Time: 04/21/23  2:36 AM   Specimen: BLOOD  Result Value Ref Range Status   Specimen Description   Final    BLOOD  LEFT [facility] Performed at [facility], [address], [place], [place] [address]    Special Requests   Final    BOTTLES DRAWN AEROBIC AND ANAEROBIC Blood Culture adequate volume Performed at [facility], [address] [place], [place] [address]    Culture  Setup Time   Final    GRAM POSITIVE COCCI AEROBIC BOTTLE ONLY CRITICAL RESULT CALLED TO, READ BACK BY AND VERIFIED WITH: JASON ROBINS AT 2213 ON  04/21/23 BY SS    Culture (A)  Final    STAPHYLOCOCCUS CAPITIS UNABLE TO RECOVER SEPI IN CULTURE CULTURE REINCUBATED FOR BETTER GROWTH Performed at [facility], [address] [place], [place] [address]    Report Status PENDING  Incomplete   Organism ID, Bacteria STAPHYLOCOCCUS CAPITIS  Final      Susceptibility   Staphylococcus capitis - MIC*     CIPROFLOXACIN <=0.5 SENSITIVE Sensitive     ERYTHROMYCIN >=8 RESISTANT Resistant     GENTAMICIN <=0.5 SENSITIVE Sensitive     OXACILLIN <=0.25 SENSITIVE Sensitive     TETRACYCLINE >=16 RESISTANT Resistant     VANCOMYCIN <=0.5 SENSITIVE Sensitive     TRIMETH/SULFA <=10 SENSITIVE Sensitive     CLINDAMYCIN RESISTANT Resistant     RIFAMPIN <=0.5 SENSITIVE Sensitive     Inducible Clindamycin POSITIVE Resistant     * STAPHYLOCOCCUS CAPITIS  Blood Culture ID Panel (Reflexed)     Status: Abnormal   Collection Time: 04/21/23  2:36 AM  Result Value Ref Range Status   Enterococcus faecalis NOT DETECTED NOT DETECTED Final   Enterococcus Faecium NOT DETECTED NOT DETECTED Final   Listeria monocytogenes NOT DETECTED NOT DETECTED Final   Staphylococcus species DETECTED (A) NOT DETECTED Final    Comment: CRITICAL RESULT CALLED TO, READ BACK BY AND VERIFIED WITH: JASON ROBINS AT 2213 ON 04/21/23 BY SS    Staphylococcus aureus (BCID) NOT DETECTED NOT DETECTED Final   Staphylococcus epidermidis DETECTED (A) NOT DETECTED Final    Comment: Methicillin (oxacillin) resistant coagulase negative staphylococcus. Possible blood culture contaminant (unless isolated from more than one blood culture draw or clinical case suggests pathogenicity). No antibiotic treatment is indicated for blood  culture contaminants. CRITICAL RESULT CALLED TO, READ BACK BY AND VERIFIED WITH: JASON ROBINS AT 2213 ON 04/21/23 BY SS    Staphylococcus lugdunensis NOT DETECTED NOT DETECTED Final   Streptococcus species NOT DETECTED NOT DETECTED Final   Streptococcus agalactiae NOT DETECTED NOT DETECTED Final   Streptococcus pneumoniae NOT DETECTED NOT DETECTED Final   Streptococcus pyogenes NOT DETECTED NOT DETECTED Final   A.calcoaceticus-baumannii NOT DETECTED NOT DETECTED Final   Bacteroides fragilis NOT DETECTED NOT DETECTED Final   Enterobacterales NOT DETECTED NOT DETECTED Final   Enterobacter cloacae complex NOT DETECTED NOT  DETECTED Final   Escherichia coli NOT DETECTED NOT DETECTED Final   Klebsiella aerogenes NOT DETECTED NOT DETECTED Final   Klebsiella oxytoca NOT DETECTED NOT DETECTED Final   Klebsiella pneumoniae NOT DETECTED NOT DETECTED Final   Proteus species NOT DETECTED NOT DETECTED Final   Salmonella species NOT DETECTED NOT DETECTED Final   Serratia marcescens NOT DETECTED NOT DETECTED Final   Haemophilus influenzae NOT DETECTED NOT DETECTED Final   Neisseria meningitidis NOT DETECTED NOT DETECTED Final   Pseudomonas aeruginosa NOT DETECTED NOT DETECTED Final   Stenotrophomonas maltophilia NOT DETECTED NOT DETECTED Final   Candida albicans NOT DETECTED NOT DETECTED Final   Candida auris NOT DETECTED NOT DETECTED Final   Candida glabrata NOT DETECTED NOT DETECTED Final   Candida krusei NOT DETECTED NOT DETECTED Final   Candida parapsilosis NOT DETECTED NOT DETECTED Final   Candida tropicalis NOT DETECTED NOT DETECTED Final   Cryptococcus neoformans/gattii NOT DETECTED NOT DETECTED Final   Methicillin resistance mecA/C DETECTED (A) NOT DETECTED Final    Comment: CRITICAL RESULT CALLED TO, READ BACK BY AND VERIFIED WITH: JASON ROBINS AT 2213 ON 04/21/23 BY SS Performed at [facility], [address] [place],  [place] [address]   Urine Culture (for pregnant, neutropenic or urologic patients or patients with an indwelling urinary catheter)     Status: None   Collection Time: 04/22/23  5:22 PM   Specimen: Urine, Clean Catch  Result Value Ref Range Status   Specimen Description   Final    URINE, CLEAN CATCH Performed at [facility], [address], [place], [place] [address]    Special Requests   Final    NONE Performed at [facility], [address] [place], [place] [address]    Culture   Final    NO GROWTH Performed at [facility], [address], [place], [place] [address]    Report Status 04/23/2023 FINAL  Final  Culture, blood (Routine X 2) w  Reflex to ID Panel     Status: None (Preliminary result)   Collection Time: 04/23/23  6:10 AM   Specimen: BLOOD  Result Value Ref Range Status   Specimen Description   Final    BLOOD  LEFT HAND Performed at [facility], [address], [place], [place] [address]    Special Requests   Final    BOTTLES DRAWN AEROBIC ONLY Blood Culture results may not be optimal due to an inadequate volume of blood received in culture bottles Performed at [facility], [address] [place], [place] [address]    Culture  Setup Time   Final    GRAM POSITIVE COCCI AEROBIC BOTTLE ONLY CRITICAL RESULT CALLED TO, READ BACK BY AND VERIFIED WITH: DEVON MITCHELL 04/24/2023 1422 CP Performed at [facility], [address] [place], [place] [address]    Culture   Final    GRAM POSITIVE COCCI CULTURE REINCUBATED FOR BETTER GROWTH Performed at [facility], [address], [place], [place] [address]    Report Status PENDING  Incomplete  Culture, blood (Routine X 2) w Reflex to ID Panel     Status: None (Preliminary result)   Collection Time: 04/23/23  6:21 AM   Specimen: BLOOD  Result Value Ref Range Status   Specimen Description BLOOD  RIGHT HAND  Final   Special Requests   Final    BOTTLES DRAWN AEROBIC AND ANAEROBIC Blood Culture adequate volume   Culture   Final    NO GROWTH 2 DAYS Performed at [facility], [address] [place], [place] [address]    Report Status PENDING  Incomplete

## 2023-04-25 NOTE — Progress Notes (Addendum)
PROGRESS NOTE  Christopher Herman    DOB: February 18, 1976, 47 y.o.  ZOX:[id number]    Code Status: Full Code   DOA: 04/21/2023   LOS: 4   Brief hospital course  Christopher Herman is a 48 y.o. male with a PMH significant for severe obesity s/p bariatric surgery, OSA, hypoventilation syndrome, HTN, GERD, cholelithiasis, bilateral extremity lipoma, chronic lymphedema status post IVC filter placement, IDA.  They presented from home to the ED on 04/21/2023 with generalized weakness, extreme fatigue, unintended weight loss, disuse of upper extremities and poor p.o. intake  x a few days. He is chronically bed bound and malnourished.   In the ED, it was found that they had BP 100/73  Pulse (!) 110  Temp 98.2 F (36.8 C) (Oral)  Resp 15  Ht 5\' 5"  (1.651 m)  Wt (!) 185.4 kg  SpO2 100%  BMI 68.02 kg/m .  Significant findings included WBC 27.5, hgb 9.5, platelet 249, Cr 0.74, Na+ 130, K+ 3.2. SARS-CoV-2 PCR negative, Influenza PCR> negative. Blood and urine cultures pending.  Chest Xray> no active cardiopulmonary process   They were initially treated with vancomycin, cefepime, metronidazole.  They were admitted to the ICU for septic shock with hypotension resistant to bolus and required vasopressors.   4/26: consulted nephrology for concerns of worsening renal function and electrolyte abnormalities. Restarted IVF for renal support and hypotension.   04/25/23 - renal function continues to worsen. Vanc trough elevated so not receiving any more Abx currently.   Assessment & Plan  Principal Problem:   Severe hypothyroidism Active Problems:   Malnutrition of moderate degree (HCC)   Generalized weakness   Ulcer of right ankle (HCC)   Hypotension due to hypovolemia  SEPTIC shock SOURCE-UTI- has been off vasopressors since transfer out of ICU and maintaining MAP ~65. MRSE in 2/2 BxCx. Sensitive to vancomycin.  Repeat blood cultures from 4/25 NGTD.  - continue midodrine 10mg  TID - requiring IV fluid  support to maintain blood pressure - continuous tele - continue Abx- ID on board, appreciate your recs  - CTX discontinued.   - vancomycin being held for highly elevated trough. Likely related to poor renal function.  - monitor CBC   Severe Hypothyroidism- Last TSH, Free T4 check was 11 years ago with normal TSH Presents w/ generalized weakness, hyponatremia, hypothermia, hypotension with vasopressor-refractory shock TSH: 9.134 Free T4:1.34 -s/p IV Synthroid 250 mcg followed by 100 mcg daily. Repeat TSH on 4/25 is 1.393 - hydrocortisone 100 mg IV every 8- will start to wean slowly   AKI- Cr 1.06>1.27 today, up from baseline of about 0.6 Hyponatremia- 130>126>128.  hypokalemia- K+ 3.3>3.2>4.0 Renal US neg for hydronephrosis or obstruction. Foley removed 4/26. Only 350cc UOP yesterday, up from 100cc the day prior.  - continue IV fluids - consulted nephrology, appreciate your recs -Follow BMP -treat hypothyroidism as above - strict I/O  Thrombocytosis  Significant leukocytosis and rising despite supportive care and antibiotics. WBC 27.5>32.8>44.4>32.4. diff showing abs neutrophil predominance. Platelets 477>431. Morphology unremarkable on pathology report. - serial CBC - heme/onc consult   Obesity  malnutrition s/p bariatric surgery  chronic diarrhea - RD consult, appreciate your recs - start creon - imodium scheduled. Was not tolerating rectal tube. Having slightly less BMs per day - continue supplements - PT/OT - copper supplements - he requested to have all medications changed from tablets as the crushed and split tablets are difficult for him to tolerate. Prefers capsules and liquids or powder forms. Consulted pharmacy to  help review and change as able.   Sinus tachycardia  prolonged Qtc- 501. HR mildly increased today. Asymptomatic - ordered repeat ECG  - avoid Qtc prolonging agents  Bed-bound - PT/OT evaluation and therapy - frequent turning   Body mass index is  66.15 kg/m.  VTE ppx: lovenox  Diet:     Diet   Diet regular Room service appropriate? Yes with Assist; Fluid consistency: Thin   Consultants: ID Nephrology  Heme/onc  Subjective 04/25/23    Pt reports feeling alright overall. Asks for medication changes as written above. States he was unable to tolerate the rectal tube and does not want to try it again. Continues to have frequent BM but has slightly lessened and also thinks his "stomach quieted down" some.  He also asks for [facility] information.    Objective   Vitals:   04/24/23 1633 04/24/23 2148 04/24/23 2350 04/25/23 0528  BP: 103/77 93/72 92/74  99/72  Pulse: (!) 108 (!) 119 (!) 113 (!) 115  Resp: 18 18 18 18   Temp: (!) 97.4 F (36.3 C) 97.8 F (36.6 C) 97.8 F (36.6 C) 98.1 F (36.7 C)  TempSrc: Oral Oral Oral Oral  SpO2: 100% 100% 99% 100%  Weight:      Height:        Intake/Output Summary (Last 24 hours) at 04/25/2023 0811 Last data filed at 04/25/2023 0538 Gross per 24 hour  Intake 955.77 ml  Output 350 ml  Net 605.77 ml    Filed Weights   04/21/23 0410 04/22/23 0500 04/23/23 0600  Weight: (!) 185.4 kg (!) 176 kg (!) 180.3 kg    Physical Exam:  General: awake, alert, NAD. Appears tired HEENT: atraumatic, clear conjunctiva, anicteric sclera, MMM, hearing grossly normal Respiratory: normal respiratory effort. CTAB Cardiovascular: quick capillary refill, normal S1/S2, RRR, no JVD, murmurs Gastrointestinal: obese, soft, NT, ND Nervous: A&O x3. no gross focal neurologic deficits, low volume speech Extremities: no edema, low muscle tone. LE with chronic edema changes Skin: dry, intact, normal temperature, normal color. No rashes, lesions or ulcers on exposed skin Psychiatry: normal mood, congruent affect  Labs   I have personally reviewed the following labs and imaging studies CBC    Component Value Date/Time   WBC 33.2 (H) 04/25/2023 0540   RBC 3.02 (L) 04/25/2023 0540   RBC 3.05 (L) 04/25/2023 0540    HGB 8.2 (L) 04/25/2023 0540   HGB 12.8 (L) 02/11/2012 1227   HCT 25.8 (L) 04/25/2023 0540   HCT 39.3 (L) 02/11/2012 1227   PLT 514 (H) 04/25/2023 0540   PLT 333 02/11/2012 1227   MCV 85.4 04/25/2023 0540   MCV 83 02/11/2012 1227   MCH 27.2 04/25/2023 0540   MCHC 31.8 04/25/2023 0540   RDW 20.0 (H) 04/25/2023 0540   RDW 16.0 (H) 02/11/2012 1227   LYMPHSABS 2.0 04/25/2023 0540   LYMPHSABS 2.2 02/11/2012 1227   MONOABS 1.1 (H) 04/25/2023 0540   MONOABS 0.5 02/11/2012 1227   EOSABS 0.0 04/25/2023 0540   EOSABS 0.1 02/11/2012 1227   BASOSABS 0.1 04/25/2023 0540   BASOSABS 0.0 02/11/2012 1227      Latest Ref Rng & Units 04/25/2023    5:40 AM 04/24/2023    4:30 AM 04/23/2023    5:35 AM  BMP  Glucose 70 - 99 mg/dL 540  981  191   BUN 6 - 20 mg/dL 37  36  31   Creatinine 0.61 - 1.24 mg/dL 4.78  2.95  6.21   Sodium  135 - 145 mmol/L 128  126  130   Potassium 3.5 - 5.1 mmol/L 4.0  3.2  3.3   Chloride 98 - 111 mmol/L 101  96  98   CO2 22 - 32 mmol/L 16  20  20    Calcium 8.9 - 10.3 mg/dL 7.7  7.5  7.8     US RENAL  Result Date: 04/24/2023 CLINICAL DATA:  Oliguria EXAM: RENAL / URINARY TRACT ULTRASOUND COMPLETE COMPARISON:  02/11/2022 abdomen ultrasound FINDINGS: Right Kidney: Renal measurements: 9.8 x 3.7 x 4.2 cm = volume: 80 mL. Echogenicity within normal limits. No mass or hydronephrosis visualized. In the inferior pole, there is a shadowing calculus that measures up to 7 mm. Left Kidney: Renal measurements: 9.6 x 4.8 x 4.9 cm = volume: 120 mL. Echogenicity within normal limits. No mass or hydronephrosis visualized. Multiple shadowing calculi are noted, measuring up to 9 mm in the mid left kidney, 7 mm in the mid to inferior left kidney, and 10 mm in the inferior left kidney. Bladder: Foley is noted within the bladder, which is decompressed. Other: None. IMPRESSION: 1. No hydronephrosis. 2. Bilateral nonobstructing renal calculi. Electronically Signed   By: Wiliam Ke M.D.   On:  04/24/2023 11:55    Disposition Plan & Communication  Patient status: Inpatient  Admitted From: Home Planned disposition location: TBD Anticipated discharge date: TBD pending clinical improvement   Family Communication: none at bedside    Author: Leeroy Bock, DO [facility] 04/25/2023, 8:11 AM   Available by Epic secure chat 7AM-7PM. If 7PM-7AM, please contact night-coverage.  [facility] contact information found on [email or web address].

## 2023-04-25 NOTE — Progress Notes (Signed)
Pharmacy Antibiotic Note  Christopher Herman is a 47 y.o. male admitted on 04/21/2023 with sepsis.  Pharmacy has been consulted for Vancomycin and piperacillin/tazobactam dosing. Patient presents with fatigue, decreased appetite and weight loss.    Today, 04/25/2023 Day #4 antibiotics - currently on vancomycin and ceftriaxone Renal - SCr 1.06 mg/dl - trending up WBC 16.1 - looks to have chronic leukocytosis since Feb 2023 Afebrile BMI ~65 4/23 blood cultures: 3/4 bottles GPC, BCID detects MRSE currently S capitis growing in both sets 4/25 blood cultures: 1/3 bottles with GPC (no BCID) ID consulted 4/24 Received vancomycin IV 4/23 0254 1000mg  4/23 0444 1500mg   Vancomycin levels: Dose - vancomycin 1750mg  IV q12h (dose prior to level given 4/25 at 22:12) Vancomycin trough (prior to 4th maintenance dose) 4/26 at 11:10 = > 60 mcg/mL  Plan: Vancomycin level checked this morning when noted SCr trend.  Vancomycin trough supratherapeutic (unexpected based on age and CrCl). Level drawn prior to dose  Hold vancomycin (received about [~365mg ] of the dose at 11:59a today)  Check random level 4/27 with am labs Monitor renal function ID consulted Follow-up cultures/plan - repeat blood cultures with [facility]  4/27:  Vanc level @ 0540 = > 60  - pt not clearing vanc despite excellent renal function, possibly due to accumulation (BMI = 66)  - will recheck Vanc level in 24 hrs on 4/28 @ 0500 - will not order additional vanc doses until level is 15 - 20  Height: 5\' 5"  (165.1 cm) Weight: (!) 180.3 kg (397 lb 7.8 oz) IBW/kg (Calculated) : 61.5  Temp (24hrs), Avg:97.8 F (36.6 C), Min:97.4 F (36.3 C), Max:98.1 F (36.7 C)  Recent Labs  Lab 04/21/23 0201 04/21/23 0426 04/21/23 0429 04/21/23 1505 04/22/23 0556 04/23/23 0535 04/24/23 0430 04/24/23 1110 04/25/23 0540  WBC 24.6* 27.5*  --   --  32.8* 44.4* 32.4*  --  33.2*  CREATININE 0.82 0.74  --  0.75 0.87 0.89 1.06  --  1.27*  LATICACIDVEN  2.1*  --  2.3* 1.1  --   --   --   --   --   VANCOTROUGH  --   --   --   --   --   --   --  >60*  --   VANCORANDOM  --   --   --   --   --   --   --   --  >60*     Estimated Creatinine Clearance: 113.2 mL/min (A) (by C-G formula based on SCr of 1.27 mg/dL (H)).    No Known Allergies  Antimicrobials this admission: Vanc 4/23 >> 4/23, 4/24 >> Ceftriaxone 4/24 >> Linezolid 4/23 >>4/23 Zosyn 4/23 >>4/24  Dose adjustments this admission:   Microbiology results:  4/23 BCx: 3/4 bottles GPC, BCID detects MRSE, S capitis in both sets 4/25 blood cultures: 1/3 bottles with GPC (no BCID)  Thank you for allowing pharmacy to be a part of this patient's care.  Juliette Alcide, PharmD, BCPS, BCIDP Work Cell: [phone] 04/25/2023 6:54 AM

## 2023-04-25 NOTE — Progress Notes (Signed)
PHARMACIST - PHYSICIAN COMMUNICATION  CONCERNING: IV to Oral Route Change Policy  RECOMMENDATION: This patient is receiving pantoprazole by the intravenous route.  Based on criteria approved by the Pharmacy and Therapeutics Committee, the intravenous medication(s) is/are being converted to the equivalent oral dose form(s).  DESCRIPTION: These criteria include: The patient is eating (either orally or via tube) and/or has been taking other orally administered medications for a least 24 hours The patient has no evidence of active gastrointestinal bleeding or impaired GI absorption (gastrectomy, short bowel, patient on TNA or NPO).  If you have questions about this conversion, please contact the Pharmacy Department   Tressie Ellis, [facility] 04/25/2023 7:30 AM

## 2023-04-25 NOTE — Progress Notes (Signed)
[facility] [place], [place] 04/25/23  Subjective:   Hospital day # 4  Does not offer any new acute complaints. Sodium remains chronically low.  Today's level is at 128. Serum creatinine is elevated as noted below. Patient has pure wick but he has weeping edema from his legs. Renal: 04/26 0701 - 04/27 0700 In: 955.8 [I.V.:755.8; IV Piggyback:200] Out: 350 [Urine:350] Lab Results  Component Value Date   CREATININE 1.27 (H) 04/25/2023   CREATININE 1.06 04/24/2023   CREATININE 0.89 04/23/2023     Objective:  Vital signs in last 24 hours:  Temp:  [97.4 F (36.3 C)-98.1 F (36.7 C)] 98.1 F (36.7 C) (04/27 0528) Pulse Rate:  [99-119] 115 (04/27 0528) Resp:  [16-18] 18 (04/27 0528) BP: (91-103)/(72-77) 99/72 (04/27 0528) SpO2:  [99 %-100 %] 100 % (04/27 0528)  Weight change:  Filed Weights   04/21/23 0410 04/22/23 0500 04/23/23 0600  Weight: (!) 185.4 kg (!) 176 kg (!) 180.3 kg    Intake/Output:    Intake/Output Summary (Last 24 hours) at 04/25/2023 0847 Last data filed at 04/25/2023 1610 Gross per 24 hour  Intake 955.77 ml  Output 350 ml  Net 605.77 ml     Physical Exam: General: Super morbidly obese gentleman, laying in the bed  HEENT Moist oral mucous membranes  Pulm/lungs Normal breathing effort room air  CVS/Heart No rub  Abdomen:  Obese, nontender  Extremities: Significant dependent peripheral edema on the thighs  Neurologic: Alert, oriented  Skin: Lichenified skin over calves and lower thighs  Dialysis Access: None at present       Basic Metabolic Panel:  Recent Labs  Lab 04/21/23 0426 04/21/23 1505 04/22/23 0556 04/23/23 0535 04/24/23 0430 04/25/23 0540  NA 130* 126* 127* 130* 126* 128*  K 3.1* 3.4* 3.3* 3.3* 3.2* 4.0  CL 100 96* 97* 98 96* 101  CO2 20* 19* 22 20* 20* 16*  GLUCOSE 141* 200* 131* 102* 116* 114*  BUN 26* 32* 29* 31* 36* 37*  CREATININE 0.74 0.75 0.87 0.89 1.06 1.27*  CALCIUM 7.3* 7.5* 7.7* 7.8* 7.5*  7.7*  MG 2.1  --  2.1 2.0 2.0  --   PHOS 3.2  --  2.9 2.8 2.9 3.0     CBC: Recent Labs  Lab 04/21/23 0201 04/21/23 0426 04/22/23 0556 04/23/23 0535 04/24/23 0430 04/25/23 0540  WBC 24.6* 27.5* 32.8* 44.4* 32.4* 33.2*  NEUTROABS 20.6*  --  28.9* 38.4* 29.1* 29.1*  HGB 10.4* 9.5* 8.9* 8.2* 7.7* 8.2*  HCT 33.2* 31.2* 27.0* 25.7* 23.6* 25.8*  MCV 85.3 87.4 82.6 84.5 84.6 85.4  PLT 541* 249 575* 477* 431* 514*     No results found for: "HEPBSAG", "HEPBSAB", "HEPBIGM"    Microbiology:  Recent Results (from the past 240 hour(s))  Blood Culture (routine x 2)     Status: Abnormal (Preliminary result)   Collection Time: 04/21/23  2:02 AM   Specimen: BLOOD  Result Value Ref Range Status   Specimen Description   Final    BLOOD RIGHT ARM Performed at [facility], [address], [place], [place] [address]    Special Requests   Final    BOTTLES DRAWN AEROBIC AND ANAEROBIC Blood Culture adequate volume Performed at [facility], [address] [place], [place] [address]    Culture  Setup Time   Final    GRAM POSITIVE COCCI IN BOTH AEROBIC AND ANAEROBIC BOTTLES CRITICAL VALUE NOTED.  VALUE IS CONSISTENT WITH PREVIOUSLY REPORTED AND CALLED  VALUE. Performed at [facility], [address], [place], [place] [address]    Culture (A)  Final    STAPHYLOCOCCUS CAPITIS CONFIRMATION OF SUSCEPTIBILITIES IN PROGRESS CULTURE REINCUBATED FOR BETTER GROWTH Performed at [facility], [address] [place], [place] [address]    Report Status PENDING  Incomplete  SARS Coronavirus 2 by RT PCR (hospital order, performed in [facility] hospital lab) *cepheid single result test* Anterior Nasal Swab     Status: None   Collection Time: 04/21/23  2:18 AM   Specimen: Anterior Nasal Swab  Result Value Ref Range Status   SARS Coronavirus 2 by RT PCR NEGATIVE NEGATIVE Final    Comment: (NOTE) SARS-CoV-2 target nucleic acids are NOT DETECTED.  The SARS-CoV-2  RNA is generally detectable in upper and lower respiratory specimens during the acute phase of infection. The lowest concentration of SARS-CoV-2 viral copies this assay can detect is 250 copies / mL. A negative result does not preclude SARS-CoV-2 infection and should not be used as the sole basis for treatment or other patient management decisions.  A negative result may occur with improper specimen collection / handling, submission of specimen other than nasopharyngeal swab, presence of viral mutation(s) within the areas targeted by this assay, and inadequate number of viral copies (<250 copies / mL). A negative result must be combined with clinical observations, patient history, and epidemiological information.  Fact Sheet for Patients:   RoadLapTop.co.za  Fact Sheet for Healthcare Providers: http://kim-miller.com/  This test is not yet approved or  cleared by the [place] FDA and has been authorized for detection and/or diagnosis of SARS-CoV-2 by FDA under an Emergency Use Authorization (EUA).  This EUA will remain in effect (meaning this test can be used) for the duration of the COVID-19 declaration under Section 564(b)(1) of the Act, 21 U.S.C. section 360bbb-3(b)(1), unless the authorization is terminated or revoked sooner.  Performed at [facility], [address], [place], [place] [address]   Blood Culture (routine x 2)     Status: Abnormal (Preliminary result)   Collection Time: 04/21/23  2:36 AM   Specimen: BLOOD  Result Value Ref Range Status   Specimen Description   Final    BLOOD  LEFT [facility] Performed at [facility], [address] [place], [place] [address]    Special Requests   Final    BOTTLES DRAWN AEROBIC AND ANAEROBIC Blood Culture adequate volume Performed at [facility], [address] [place], [place] [address]    Culture  Setup Time   Final    GRAM POSITIVE COCCI AEROBIC  BOTTLE ONLY CRITICAL RESULT CALLED TO, READ BACK BY AND VERIFIED WITH: JASON ROBINS AT 2213 ON 04/21/23 BY SS    Culture (A)  Final    STAPHYLOCOCCUS CAPITIS UNABLE TO RECOVER SEPI IN CULTURE CULTURE REINCUBATED FOR BETTER GROWTH Performed at [facility], [address], [place], [place] [address]    Report Status PENDING  Incomplete   Organism ID, Bacteria STAPHYLOCOCCUS CAPITIS  Final      Susceptibility   Staphylococcus capitis - MIC*    CIPROFLOXACIN <=0.5 SENSITIVE Sensitive     ERYTHROMYCIN >=8 RESISTANT Resistant     GENTAMICIN <=0.5 SENSITIVE Sensitive     OXACILLIN <=0.25 SENSITIVE Sensitive     TETRACYCLINE >=16 RESISTANT Resistant     VANCOMYCIN <=0.5 SENSITIVE Sensitive     TRIMETH/SULFA <=10 SENSITIVE Sensitive     CLINDAMYCIN RESISTANT Resistant  RIFAMPIN <=0.5 SENSITIVE Sensitive     Inducible Clindamycin POSITIVE Resistant     * STAPHYLOCOCCUS CAPITIS  Blood Culture ID Panel (Reflexed)     Status: Abnormal   Collection Time: 04/21/23  2:36 AM  Result Value Ref Range Status   Enterococcus faecalis NOT DETECTED NOT DETECTED Final   Enterococcus Faecium NOT DETECTED NOT DETECTED Final   Listeria monocytogenes NOT DETECTED NOT DETECTED Final   Staphylococcus species DETECTED (A) NOT DETECTED Final    Comment: CRITICAL RESULT CALLED TO, READ BACK BY AND VERIFIED WITH: JASON ROBINS AT 2213 ON 04/21/23 BY SS    Staphylococcus aureus (BCID) NOT DETECTED NOT DETECTED Final   Staphylococcus epidermidis DETECTED (A) NOT DETECTED Final    Comment: Methicillin (oxacillin) resistant coagulase negative staphylococcus. Possible blood culture contaminant (unless isolated from more than one blood culture draw or clinical case suggests pathogenicity). No antibiotic treatment is indicated for blood  culture contaminants. CRITICAL RESULT CALLED TO, READ BACK BY AND VERIFIED WITH: JASON ROBINS AT 2213 ON 04/21/23 BY SS    Staphylococcus lugdunensis NOT DETECTED NOT DETECTED  Final   Streptococcus species NOT DETECTED NOT DETECTED Final   Streptococcus agalactiae NOT DETECTED NOT DETECTED Final   Streptococcus pneumoniae NOT DETECTED NOT DETECTED Final   Streptococcus pyogenes NOT DETECTED NOT DETECTED Final   A.calcoaceticus-baumannii NOT DETECTED NOT DETECTED Final   Bacteroides fragilis NOT DETECTED NOT DETECTED Final   Enterobacterales NOT DETECTED NOT DETECTED Final   Enterobacter cloacae complex NOT DETECTED NOT DETECTED Final   Escherichia coli NOT DETECTED NOT DETECTED Final   Klebsiella aerogenes NOT DETECTED NOT DETECTED Final   Klebsiella oxytoca NOT DETECTED NOT DETECTED Final   Klebsiella pneumoniae NOT DETECTED NOT DETECTED Final   Proteus species NOT DETECTED NOT DETECTED Final   Salmonella species NOT DETECTED NOT DETECTED Final   Serratia marcescens NOT DETECTED NOT DETECTED Final   Haemophilus influenzae NOT DETECTED NOT DETECTED Final   Neisseria meningitidis NOT DETECTED NOT DETECTED Final   Pseudomonas aeruginosa NOT DETECTED NOT DETECTED Final   Stenotrophomonas maltophilia NOT DETECTED NOT DETECTED Final   Candida albicans NOT DETECTED NOT DETECTED Final   Candida auris NOT DETECTED NOT DETECTED Final   Candida glabrata NOT DETECTED NOT DETECTED Final   Candida krusei NOT DETECTED NOT DETECTED Final   Candida parapsilosis NOT DETECTED NOT DETECTED Final   Candida tropicalis NOT DETECTED NOT DETECTED Final   Cryptococcus neoformans/gattii NOT DETECTED NOT DETECTED Final   Methicillin resistance mecA/C DETECTED (A) NOT DETECTED Final    Comment: CRITICAL RESULT CALLED TO, READ BACK BY AND VERIFIED WITH: JASON ROBINS AT 2213 ON 04/21/23 BY SS Performed at [facility], [address] [place], [place] [address]   Urine Culture (for pregnant, neutropenic or urologic patients or patients with an indwelling urinary catheter)     Status: None   Collection Time: 04/22/23  5:22 PM   Specimen: Urine, Clean Catch  Result Value Ref  Range Status   Specimen Description   Final    URINE, CLEAN CATCH Performed at [facility], [address] [place], [place] [address]    Special Requests   Final    NONE Performed at [facility], [address], [place], [place] [address]    Culture   Final    NO GROWTH Performed at [facility], [address] [place], [place] [address]    Report Status 04/23/2023 FINAL  Final  Culture, blood (Routine X 2)  w Reflex to ID Panel     Status: None (Preliminary result)   Collection Time: 04/23/23  6:10 AM   Specimen: BLOOD  Result Value Ref Range Status   Specimen Description BLOOD  LEFT HAND  Final   Special Requests   Final    BOTTLES DRAWN AEROBIC ONLY Blood Culture results may not be optimal due to an inadequate volume of blood received in culture bottles   Culture  Setup Time   Final    GRAM POSITIVE COCCI AEROBIC BOTTLE ONLY CRITICAL RESULT CALLED TO, READ BACK BY AND VERIFIED WITH: DEVON MITCHELL 04/24/2023 1422 CP Performed at [facility], [address] [place], [place] [address]    Culture GRAM POSITIVE COCCI  Final   Report Status PENDING  Incomplete  Culture, blood (Routine X 2) w Reflex to ID Panel     Status: None (Preliminary result)   Collection Time: 04/23/23  6:21 AM   Specimen: BLOOD  Result Value Ref Range Status   Specimen Description BLOOD  RIGHT HAND  Final   Special Requests   Final    BOTTLES DRAWN AEROBIC AND ANAEROBIC Blood Culture adequate volume   Culture   Final    NO GROWTH 2 DAYS Performed at [facility], [address], [place], [place] [address]    Report Status PENDING  Incomplete    Coagulation Studies: No results for input(s): "LABPROT", "INR" in the last 72 hours.  Urinalysis: No results for input(s): "COLORURINE", "LABSPEC", "PHURINE", "GLUCOSEU", "HGBUR", "BILIRUBINUR", "KETONESUR", "PROTEINUR", "UROBILINOGEN", "NITRITE", "LEUKOCYTESUR" in the last 72 hours.  Invalid input(s):  "APPERANCEUR"    Imaging: US RENAL  Result Date: 04/24/2023 CLINICAL DATA:  Oliguria EXAM: RENAL / URINARY TRACT ULTRASOUND COMPLETE COMPARISON:  02/11/2022 abdomen ultrasound FINDINGS: Right Kidney: Renal measurements: 9.8 x 3.7 x 4.2 cm = volume: 80 mL. Echogenicity within normal limits. No mass or hydronephrosis visualized. In the inferior pole, there is a shadowing calculus that measures up to 7 mm. Left Kidney: Renal measurements: 9.6 x 4.8 x 4.9 cm = volume: 120 mL. Echogenicity within normal limits. No mass or hydronephrosis visualized. Multiple shadowing calculi are noted, measuring up to 9 mm in the mid left kidney, 7 mm in the mid to inferior left kidney, and 10 mm in the inferior left kidney. Bladder: Foley is noted within the bladder, which is decompressed. Other: None. IMPRESSION: 1. No hydronephrosis. 2. Bilateral nonobstructing renal calculi. Electronically Signed   By: Wiliam Ke M.D.   On: 04/24/2023 11:55     Medications:    0.9 % NaCl with KCl 40 mEq / L 125 mL/hr at 04/25/23 0981    vitamin C  500 mg Oral BID   Chlorhexidine Gluconate Cloth  6 each Topical Daily   copper  8 mg Oral Daily   enoxaparin (LOVENOX) injection  0.5 mg/kg Subcutaneous Q24H   feeding supplement (KATE FARMS STANDARD 1.4)  325 mL Oral TID BM   folic acid  1 mg Oral Daily   hydrocortisone sod succinate (SOLU-CORTEF) inj  100 mg Intravenous Q8H   insulin aspart  0-15 Units Subcutaneous TID WC   levothyroxine  100 mcg Oral Q0600   lipase/protease/amylase  12,000 Units Oral TID AC   midodrine  10 mg Oral TID WC   multivitamin  15 mL Oral Daily   pantoprazole  40 mg Oral QHS   polyethylene glycol  17 g Oral Daily   thiamine  100 mg Oral Daily   vancomycin variable dose  per unstable renal function (pharmacist dosing)   Does not apply See admin instructions   vitamin A  10,000 Units Oral Daily   Vitamin D (Ergocalciferol)  50,000 Units Oral Q7 days   vitamin E  400 Units Oral Daily   zinc sulfate   220 mg Oral Daily   polyethylene glycol  Assessment/ Plan:  47 y.o. male with super morbid obesity, hypertension, obstructive sleep apnea, lymphedema, history of bariatric surgery, cholelithiasis, severe hypothyroidism, bedridden status, history of IVC filter placement admitted on 04/21/2023 for Severe hypothyroidism [E03.9] Generalized weakness [R53.1] Lower limb ulcer, ankle, right, with unspecified severity (HCC) [L97.319] Hypothyroidism, unspecified type [E03.9] Sepsis, due to unspecified organism, unspecified whether acute organ dysfunction present (HCC) [A41.9]   Acute kidney injury Likely secondary to hemodynamic alterations caused by septic shock and hypotension leading to ATN. Patient is nonoliguric. Low urine output is noted but inaccurate collection due to incontinence and also fluid losses from wounds in the legs. Monitor for now.  No acute indication for dialysis.  Generalized edema Patient has hypoalbuminemia and third spacing of fluid. Will supplement IV albumin low-dose, on a daily basis.  Hyponatremia Likely secondary to AKI and generalized volume overload.  Hypotension Agree with starting midodrine.  Chronic systolic CHF LVEF 35 to 40%, moderately decreased function, global hypokinesis, grade 1 diastolic dysfunction, normal right ventricular systolic function from echo 04/22/2023.    LOS: 4 Draken Farrior Thedore Mins 4/27/20248:47 AM  [facility] [place], [place] [phone]  Note: This note was prepared with Dragon dictation. Any transcription errors are unintentional

## 2023-04-26 DIAGNOSIS — A419 Sepsis, unspecified organism: Secondary | ICD-10-CM | POA: Diagnosis not present

## 2023-04-26 DIAGNOSIS — N170 Acute kidney failure with tubular necrosis: Secondary | ICD-10-CM

## 2023-04-26 DIAGNOSIS — R531 Weakness: Secondary | ICD-10-CM | POA: Diagnosis not present

## 2023-04-26 DIAGNOSIS — E039 Hypothyroidism, unspecified: Secondary | ICD-10-CM

## 2023-04-26 LAB — BASIC METABOLIC PANEL
Anion gap: 10 (ref 5–15)
BUN: 37 mg/dL — ABNORMAL HIGH (ref 6–20)
CO2: 15 mmol/L — ABNORMAL LOW (ref 22–32)
Calcium: 7.8 mg/dL — ABNORMAL LOW (ref 8.9–10.3)
Chloride: 103 mmol/L (ref 98–111)
Creatinine, Ser: 1.33 mg/dL — ABNORMAL HIGH (ref 0.61–1.24)
GFR, Estimated: >60 mL/min (ref >60)
Glucose, Bld: 94 mg/dL (ref 70–99)
Potassium: 4.8 mmol/L (ref 3.5–5.1)
Sodium: 128 mmol/L — ABNORMAL LOW (ref 135–145)

## 2023-04-26 LAB — CBC WITH DIFFERENTIAL/PLATELET
Abs Immature Granulocytes: 1.08 K/uL — ABNORMAL HIGH (ref 0.00–0.07)
Basophils Absolute: 0.1 K/uL (ref 0.0–0.1)
Basophils Relative: 0 %
Eosinophils Absolute: 0.1 K/uL (ref 0.0–0.5)
Eosinophils Relative: 0 %
HCT: 23.5 % — ABNORMAL LOW (ref 39.0–52.0)
Hemoglobin: 7.5 g/dL — ABNORMAL LOW (ref 13.0–17.0)
Immature Granulocytes: 3 %
Lymphocytes Relative: 11 %
Lymphs Abs: 3.5 K/uL (ref 0.7–4.0)
MCH: 27 pg (ref 26.0–34.0)
MCHC: 31.9 g/dL (ref 30.0–36.0)
MCV: 84.5 fL (ref 80.0–100.0)
Monocytes Absolute: 1.7 K/uL — ABNORMAL HIGH (ref 0.1–1.0)
Monocytes Relative: 6 %
Neutro Abs: 24.9 K/uL — ABNORMAL HIGH (ref 1.7–7.7)
Neutrophils Relative %: 80 %
Platelets: 462 K/uL — ABNORMAL HIGH (ref 150–400)
RBC: 2.78 MIL/uL — ABNORMAL LOW (ref 4.22–5.81)
RDW: 20.4 % — ABNORMAL HIGH (ref 11.5–15.5)
WBC: 31.3 K/uL — ABNORMAL HIGH (ref 4.0–10.5)
nRBC: 0 % (ref 0.0–0.2)

## 2023-04-26 LAB — CULTURE, BLOOD (ROUTINE X 2): Special Requests: ADEQUATE

## 2023-04-26 LAB — VITAMIN A: Vitamin A (Retinoic Acid): 4.3 ug/dL — ABNORMAL LOW (ref 20.1–62.0)

## 2023-04-26 LAB — VITAMIN K1, SERUM: VITAMIN K1: <0.1 ng/mL — ABNORMAL LOW (ref 0.10–2.20)

## 2023-04-26 LAB — VITAMIN E
Vitamin E (Alpha Tocopherol): 7.3 mg/L (ref 7.0–25.1)
Vitamin E(Gamma Tocopherol): 1.2 mg/L (ref 0.5–5.5)

## 2023-04-26 LAB — HEPARIN ANTI-XA: Heparin LMW: 0.91 IU/mL

## 2023-04-26 LAB — GLUCOSE, CAPILLARY
Glucose-Capillary: 113 mg/dL — ABNORMAL HIGH (ref 70–99)
Glucose-Capillary: 104 mg/dL — ABNORMAL HIGH (ref 70–99)
Glucose-Capillary: 97 mg/dL (ref 70–99)

## 2023-04-26 LAB — PHOSPHORUS: Phosphorus: 2.7 mg/dL (ref 2.5–4.6)

## 2023-04-26 LAB — VANCOMYCIN, RANDOM: Vancomycin Rm: >60 ug/mL

## 2023-04-26 LAB — HAPTOGLOBIN: Haptoglobin: 125 mg/dL (ref 23–355)

## 2023-04-26 MED ORDER — BISMUTH SUBSALICYLATE 262 MG/15ML PO SUSP
30.0000 mL | Freq: Three times a day (TID) | ORAL | Status: DC | PRN
  Administered 2023-04-26 – 2023-05-14 (×8): 30 mL via ORAL
  Filled 2023-04-26 (×3): qty 118

## 2023-04-26 MED ORDER — ACETAMINOPHEN 325 MG PO TABS
650.0000 mg | ORAL_TABLET | Freq: Four times a day (QID) | ORAL | Status: DC | PRN
  Administered 2023-04-26 – 2023-05-23 (×3): 650 mg via ORAL
  Filled 2023-04-26 (×3): qty 2

## 2023-04-26 MED ORDER — HEPARIN SODIUM (PORCINE) 5000 UNIT/ML IJ SOLN
5000.0000 [IU] | Freq: Three times a day (TID) | INTRAMUSCULAR | Status: DC
  Administered 2023-04-27 – 2023-05-09 (×35): 5000 [IU] via SUBCUTANEOUS
  Filled 2023-04-26 (×34): qty 1

## 2023-04-26 MED ORDER — LOPERAMIDE HCL 2 MG PO CAPS
2.0000 mg | ORAL_CAPSULE | ORAL | Status: DC | PRN
  Administered 2023-04-26 – 2023-05-06 (×6): 2 mg via ORAL
  Filled 2023-04-26 (×6): qty 1

## 2023-04-26 MED ORDER — BOOST PLUS PO LIQD
237.0000 mL | Freq: Three times a day (TID) | ORAL | Status: DC
  Filled 2023-04-26 (×4): qty 237

## 2023-04-26 MED ORDER — MIDODRINE HCL 5 MG PO TABS
10.0000 mg | ORAL_TABLET | Freq: Once | ORAL | Status: AC
  Administered 2023-04-26: 10 mg via ORAL
  Filled 2023-04-26: qty 2

## 2023-04-26 MED ORDER — ALBUMIN HUMAN 25 % IV SOLN
12.5000 g | Freq: Once | INTRAVENOUS | Status: AC
  Administered 2023-04-26: 12.5 g via INTRAVENOUS
  Filled 2023-04-26: qty 50

## 2023-04-26 NOTE — Plan of Care (Signed)
Continuing with plan of care. 

## 2023-04-26 NOTE — Progress Notes (Signed)
Report received from [facility], patient transferred to ICU 5 from 2A, patient placed on cardiac monitoring, received CHG bath, linens and gown changed.

## 2023-04-26 NOTE — Progress Notes (Signed)
PROGRESS NOTE  Christopher Herman    DOB: [birth year], 47 y.o.  ZOX:[id number]    Code Status: Full Code   DOA: 04/21/2023   LOS: 5   Brief hospital course  Christopher Herman is a 47 y.o. male with a PMH significant for severe obesity s/p bariatric surgery, OSA, hypoventilation syndrome, HTN, GERD, cholelithiasis, bilateral extremity lipoma, chronic lymphedema status post IVC filter placement, IDA.  They presented from home to the ED on 04/21/2023 with generalized weakness, extreme fatigue, unintended weight loss, disuse of upper extremities and poor p.o. intake  x a few days. He is chronically bed bound and malnourished.   In the ED, it was found that they had BP 100/73  Pulse (!) 110  Temp 98.2 F (36.8 C) (Oral)  Resp 15  Ht 5\' 5"  (1.651 m)  Wt (!) 185.4 kg  SpO2 100%  BMI 68.02 kg/m .  Significant findings included WBC 27.5, hgb 9.5, platelet 249, Cr 0.74, Na+ 130, K+ 3.2. SARS-CoV-2 PCR negative, Influenza PCR> negative. Blood and urine cultures pending.  Chest Xray> no active cardiopulmonary process   They were initially treated with vancomycin, cefepime, metronidazole.  They were admitted to the ICU for septic shock with hypotension resistant to bolus and required vasopressors.   4/26: consulted nephrology for concerns of worsening renal function and electrolyte abnormalities. Restarted IVF for renal support and hypotension.   4/27:renal function continues to worsen. Vanc trough elevated so not receiving any more Abx currently.   04/26/23 - again Cr continues to rise despite continued fluids and addition of albuterol. Vanc trough remains remarkably high. Consulted CCM as fluids need to be discontinued and having hypotension/tachycardia while on midodrine. May need to resume pressors. Still shock picture.   Assessment & Plan  Principal Problem:   Severe hypothyroidism Active Problems:   Malnutrition of moderate degree (HCC)   Generalized weakness   Ulcer of right ankle (HCC)    Hypotension due to hypovolemia  SEPTIC shock SOURCE-UTI- has been off vasopressors since transfer out of ICU and maintaining MAP ~60-65 but less than 60 at times and symptomatic even at rest.  MRSE in 2/2 BxCx. Sensitive to vancomycin. Trough has been >60 for 3 days without additional doses. Repeat blood cultures from 4/25 NGTD.  - continue midodrine 10mg  TID - requiring IV fluid support to maintain blood pressure but is third-spacing despite albumin.  - continuous tele - continue Abx- ID on board, appreciate your recs  - CTX discontinued.   - vancomycin being held for highly elevated trough. Likely related to poor renal function.  - monitor CBC - consider palliative discussions   Severe Hypothyroidism- Last TSH, Free T4 check was 11 years ago with normal TSH Presents w/ generalized weakness, hyponatremia, hypothermia, hypotension with vasopressor-refractory shock TSH: 9.134 Free T4:1.34 Am cortisol 4/23 WNL -s/p IV Synthroid 250 mcg followed by 100 mcg daily. Repeat TSH on 4/25 is 1.393 - hydrocortisone 100 mg IV every 12hr- weaning slowly  Acute renal failure- Cr 1.06>1.27>1.3 today, up from baseline of about 0.6 Hyponatremia- 130>126>128.  hypokalemia- K+ 3.3>3.2>4.0>4.8 s/p repletion with IVF.  Renal US neg for hydronephrosis or obstruction. Foley removed 4/26. No UOP recorded yesterday, and was minimal prior but had observed episode of incontinence during encounter, it's just not being recorded despite strict I/O ordered. May have to consider replacing foley for more accurate account - stopped IV fluids - consulted nephrology, appreciate your recs -Follow BMP -treat hypothyroidism as above - strict I/O - consider temp  cath if not improving  Thrombocytosis  Significant leukocytosis and rising despite supportive care and antibiotics. WBC 27.5>32.8>44.4>32.4>31.3. diff showing abs neutrophil predominance. Platelets [phone]. Morphology unremarkable on pathology report. -  serial CBC - heme/onc consult  - further labs pending   Obesity  malnutrition s/p bariatric surgery  chronic diarrhea - RD consult, appreciate your recs - start creon - imodium scheduled. Was not tolerating rectal tube. Having slightly less BMs per day - lactose-free diet - continue supplements - PT/OT - copper supplements - speech re-evaluation for described difficulty swallowing  Sinus tachycardia  prolonged Qtc- 501. HR intermittently in 130s and ECG showing sinus. Asymptomatic - ordered repeat ECG  - avoid Qtc prolonging agents  Bed-bound - PT/OT evaluation and therapy - frequent turning   Body mass index is 66.15 kg/m.  VTE ppx: lovenox  Diet:     Diet   Diet regular Room service appropriate? Yes with Assist; Fluid consistency: Thin   Consultants: ID Nephrology  Heme/onc CCM  Subjective 04/26/23    Pt reports feeling not good. He states he's very tired. Denies pain. Complains of 6 BM today already. Requests more imodium and no lactulose or pepper in his diet.    Objective   Vitals:   04/25/23 2200 04/26/23 0013 04/26/23 0433 04/26/23 0456  BP:  90/66 98/63 96/63   Pulse:  (!) 128 (!) 130 (!) 126  Resp: 17 18 20 20   Temp:  98.2 F (36.8 C) 97.7 F (36.5 C)   TempSrc:      SpO2:  100% 100% 100%  Weight:      Height:        Intake/Output Summary (Last 24 hours) at 04/26/2023 0758 Last data filed at 04/25/2023 1931 Gross per 24 hour  Intake 3598.5 ml  Output --  Net 3598.5 ml    Filed Weights   04/21/23 0410 04/22/23 0500 04/23/23 0600  Weight: (!) 185.4 kg (!) 176 kg (!) 180.3 kg    Physical Exam:  General: awake, alert, NAD. Appears tired HEENT: atraumatic, clear conjunctiva, anicteric sclera, MMM, hearing grossly normal Respiratory: normal respiratory effort. CTAB Cardiovascular: quick capillary refill, normal S1/S2, RRR, no JVD, murmurs Gastrointestinal: obese, soft, NT, ND Nervous: A&O x3. no gross focal neurologic deficits, low volume  speech Extremities: no edema, low muscle tone. LE with chronic edema changes Skin: dry, intact, normal temperature, normal color. No rashes, lesions or ulcers on exposed skin Psychiatry: normal mood, congruent affect  Labs   I have personally reviewed the following labs and imaging studies CBC    Component Value Date/Time   WBC 31.3 (H) 04/26/2023 0455   RBC 2.78 (L) 04/26/2023 0455   HGB 7.5 (L) 04/26/2023 0455   HGB 12.8 (L) 02/11/2012 1227   HCT 23.5 (L) 04/26/2023 0455   HCT 39.3 (L) 02/11/2012 1227   PLT 462 (H) 04/26/2023 0455   PLT 333 02/11/2012 1227   MCV 84.5 04/26/2023 0455   MCV 83 02/11/2012 1227   MCH 27.0 04/26/2023 0455   MCHC 31.9 04/26/2023 0455   RDW 20.4 (H) 04/26/2023 0455   RDW 16.0 (H) 02/11/2012 1227   LYMPHSABS 3.5 04/26/2023 0455   LYMPHSABS 2.2 02/11/2012 1227   MONOABS 1.7 (H) 04/26/2023 0455   MONOABS 0.5 02/11/2012 1227   EOSABS 0.1 04/26/2023 0455   EOSABS 0.1 02/11/2012 1227   BASOSABS 0.1 04/26/2023 0455   BASOSABS 0.0 02/11/2012 1227      Latest Ref Rng & Units 04/26/2023    4:55 AM 04/25/2023  5:40 AM 04/24/2023    4:30 AM  BMP  Glucose 70 - 99 mg/dL 94  161  096   BUN 6 - 20 mg/dL 37  37  36   Creatinine 0.61 - 1.24 mg/dL 0.45  4.09  8.11   Sodium 135 - 145 mmol/L 128  128  126   Potassium 3.5 - 5.1 mmol/L 4.8  4.0  3.2   Chloride 98 - 111 mmol/L 103  101  96   CO2 22 - 32 mmol/L 15  16  20    Calcium 8.9 - 10.3 mg/dL 7.8  7.7  7.5     US RENAL  Result Date: 04/24/2023 CLINICAL DATA:  Oliguria EXAM: RENAL / URINARY TRACT ULTRASOUND COMPLETE COMPARISON:  02/11/2022 abdomen ultrasound FINDINGS: Right Kidney: Renal measurements: 9.8 x 3.7 x 4.2 cm = volume: 80 mL. Echogenicity within normal limits. No mass or hydronephrosis visualized. In the inferior pole, there is a shadowing calculus that measures up to 7 mm. Left Kidney: Renal measurements: 9.6 x 4.8 x 4.9 cm = volume: 120 mL. Echogenicity within normal limits. No mass or  hydronephrosis visualized. Multiple shadowing calculi are noted, measuring up to 9 mm in the mid left kidney, 7 mm in the mid to inferior left kidney, and 10 mm in the inferior left kidney. Bladder: Foley is noted within the bladder, which is decompressed. Other: None. IMPRESSION: 1. No hydronephrosis. 2. Bilateral nonobstructing renal calculi. Electronically Signed   By: Wiliam Ke M.D.   On: 04/24/2023 11:55    Disposition Plan & Communication  Patient status: Inpatient  Admitted From: Home Planned disposition location: TBD Anticipated discharge date: TBD pending clinical improvement   Family Communication: none at bedside    Author: Leeroy Bock, DO [facility] 04/26/2023, 7:58 AM   Available by Epic secure chat 7AM-7PM. If 7PM-7AM, please contact night-coverage.  [facility] contact information found on [email or web address].

## 2023-04-26 NOTE — Progress Notes (Signed)
[facility] [place], [place] 04/26/23  Subjective:   Hospital day # 5  Does not offer any new acute complaints. Sodium remains chronically low.  Today's level is stable at 128. Serum creatinine is elevated as noted below. Patient has pure wick but he also has weeping edema from his legs. Renal: 04/27 0701 - 04/28 0700 In: 3598.5 [P.O.:790; I.V.:2808.5] Out: -  Lab Results  Component Value Date   CREATININE 1.33 (H) 04/26/2023   CREATININE 1.27 (H) 04/25/2023   CREATININE 1.06 04/24/2023     Objective:  Vital signs in last 24 hours:  Temp:  [97.7 F (36.5 C)-98.7 F (37.1 C)] 98.7 F (37.1 C) (04/28 1130) Pulse Rate:  [105-130] 105 (04/28 1130) Resp:  [17-20] 20 (04/28 0456) BP: (83-113)/(56-79) 99/70 (04/28 1130) SpO2:  [100 %] 100 % (04/28 0954)  Weight change:  Filed Weights   04/21/23 0410 04/22/23 0500 04/23/23 0600  Weight: (!) 185.4 kg (!) 176 kg (!) 180.3 kg    Intake/Output:    Intake/Output Summary (Last 24 hours) at 04/26/2023 1207 Last data filed at 04/25/2023 1931 Gross per 24 hour  Intake 3158.5 ml  Output --  Net 3158.5 ml      Physical Exam: General: Super morbidly obese gentleman, laying in the bed  HEENT Moist oral mucous membranes  Pulm/lungs Normal breathing effort room air  CVS/Heart No rub  Abdomen:  Obese, nontender  Extremities: Significant dependent peripheral edema on the thighs  Neurologic: Alert, oriented  Skin: Lichenified skin over calves and lower thighs  Dialysis Access: None at present       Basic Metabolic Panel:  Recent Labs  Lab 04/21/23 0426 04/21/23 1505 04/22/23 0556 04/23/23 0535 04/24/23 0430 04/25/23 0540 04/26/23 0455  NA 130*   < > 127* 130* 126* 128* 128*  K 3.1*   < > 3.3* 3.3* 3.2* 4.0 4.8  CL 100   < > 97* 98 96* 101 103  CO2 20*   < > 22 20* 20* 16* 15*  GLUCOSE 141*   < > 131* 102* 116* 114* 94  BUN 26*   < > 29* 31* 36* 37* 37*  CREATININE 0.74   < > 0.87 0.89 1.06  1.27* 1.33*  CALCIUM 7.3*   < > 7.7* 7.8* 7.5* 7.7* 7.8*  MG 2.1  --  2.1 2.0 2.0  --   --   PHOS 3.2  --  2.9 2.8 2.9 3.0 2.7   < > = values in this interval not displayed.      CBC: Recent Labs  Lab 04/22/23 0556 04/23/23 0535 04/24/23 0430 04/25/23 0540 04/26/23 0455  WBC 32.8* 44.4* 32.4* 33.2* 31.3*  NEUTROABS 28.9* 38.4* 29.1* 29.1* 24.9*  HGB 8.9* 8.2* 7.7* 8.2* 7.5*  HCT 27.0* 25.7* 23.6* 25.8* 23.5*  MCV 82.6 84.5 84.6 85.4 84.5  PLT 575* 477* 431* 514* 462*      No results found for: "HEPBSAG", "HEPBSAB", "HEPBIGM"    Microbiology:  Recent Results (from the past 240 hour(s))  Blood Culture (routine x 2)     Status: Abnormal   Collection Time: 04/21/23  2:02 AM   Specimen: BLOOD  Result Value Ref Range Status   Specimen Description   Final    BLOOD RIGHT ARM Performed at [facility], [address], [place], [place] [address]    Special Requests   Final    BOTTLES DRAWN AEROBIC AND ANAEROBIC Blood Culture adequate volume Performed at [facility], [address] [place], [place] [address]    Culture  Setup Time   Final    GRAM POSITIVE COCCI IN BOTH AEROBIC AND ANAEROBIC BOTTLES CRITICAL VALUE NOTED.  VALUE IS CONSISTENT WITH PREVIOUSLY REPORTED AND CALLED VALUE. Performed at [facility], [address] [place], [place] [address]    Culture (A)  Final    STAPHYLOCOCCUS CAPITIS STAPHYLOCOCCUS AURICULARIS    Report Status 04/26/2023 FINAL  Final   Organism ID, Bacteria STAPHYLOCOCCUS CAPITIS  Final   Organism ID, Bacteria STAPHYLOCOCCUS AURICULARIS  Final      Susceptibility   Staphylococcus auricularis - MIC*    CIPROFLOXACIN 4 RESISTANT Resistant     ERYTHROMYCIN >=8 RESISTANT Resistant     GENTAMICIN <=0.5 SENSITIVE Sensitive     OXACILLIN >=4 RESISTANT Resistant     TETRACYCLINE <=1 SENSITIVE Sensitive     VANCOMYCIN <=0.5 SENSITIVE Sensitive     TRIMETH/SULFA <=10 SENSITIVE Sensitive     CLINDAMYCIN  RESISTANT Resistant     RIFAMPIN <=0.5 SENSITIVE Sensitive     Inducible Clindamycin POSITIVE Resistant     * STAPHYLOCOCCUS AURICULARIS   Staphylococcus capitis - MIC*    CIPROFLOXACIN 4 RESISTANT Resistant     ERYTHROMYCIN >=8 RESISTANT Resistant     GENTAMICIN <=0.5 SENSITIVE Sensitive     OXACILLIN >=4 RESISTANT Resistant     TETRACYCLINE >=16 RESISTANT Resistant     VANCOMYCIN <=0.5 SENSITIVE Sensitive     TRIMETH/SULFA <=10 SENSITIVE Sensitive     CLINDAMYCIN INTERMEDIATE Intermediate     RIFAMPIN 1 SENSITIVE Sensitive     Inducible Clindamycin NEGATIVE Sensitive     * STAPHYLOCOCCUS CAPITIS  SARS Coronavirus 2 by RT PCR (hospital order, performed in [facility] hospital lab) *cepheid single result test* Anterior Nasal Swab     Status: None   Collection Time: 04/21/23  2:18 AM   Specimen: Anterior Nasal Swab  Result Value Ref Range Status   SARS Coronavirus 2 by RT PCR NEGATIVE NEGATIVE Final    Comment: (NOTE) SARS-CoV-2 target nucleic acids are NOT DETECTED.  The SARS-CoV-2 RNA is generally detectable in upper and lower respiratory specimens during the acute phase of infection. The lowest concentration of SARS-CoV-2 viral copies this assay can detect is 250 copies / mL. A negative result does not preclude SARS-CoV-2 infection and should not be used as the sole basis for treatment or other patient management decisions.  A negative result may occur with improper specimen collection / handling, submission of specimen other than nasopharyngeal swab, presence of viral mutation(s) within the areas targeted by this assay, and inadequate number of viral copies (<250 copies / mL). A negative result must be combined with clinical observations, patient history, and epidemiological information.  Fact Sheet for Patients:   RoadLapTop.co.za  Fact Sheet for Healthcare Providers: http://kim-miller.com/  This test is not yet approved or   cleared by the [place] FDA and has been authorized for detection and/or diagnosis of SARS-CoV-2 by FDA under an Emergency Use Authorization (EUA).  This EUA will remain in effect (meaning this test can be used) for the duration of the COVID-19 declaration under Section 564(b)(1) of the Act, 21 U.S.C. section 360bbb-3(b)(1), unless the authorization is terminated or revoked sooner.  Performed at [facility], [address] [place], [place] [address]   Blood Culture (routine x 2)     Status: Abnormal (Preliminary result)   Collection Time: 04/21/23  2:36 AM   Specimen: BLOOD  Result Value Ref Range Status  Specimen Description   Final    BLOOD  LEFT AC Performed at [facility], [address], [place], [place] [address]    Special Requests   Final    BOTTLES DRAWN AEROBIC AND ANAEROBIC Blood Culture adequate volume Performed at [facility], [address] [place], [place] [address]    Culture  Setup Time   Final    GRAM POSITIVE COCCI AEROBIC BOTTLE ONLY CRITICAL RESULT CALLED TO, READ BACK BY AND VERIFIED WITH: JASON ROBINS AT 2213 ON 04/21/23 BY SS    Culture (A)  Final    STAPHYLOCOCCUS CAPITIS UNABLE TO RECOVER SEPI IN CULTURE Performed at [facility], [address], [place], [place] [address]    Report Status PENDING  Incomplete   Organism ID, Bacteria STAPHYLOCOCCUS CAPITIS  Final      Susceptibility   Staphylococcus capitis - MIC*    CIPROFLOXACIN <=0.5 SENSITIVE Sensitive     ERYTHROMYCIN >=8 RESISTANT Resistant     GENTAMICIN <=0.5 SENSITIVE Sensitive     OXACILLIN <=0.25 SENSITIVE Sensitive     TETRACYCLINE >=16 RESISTANT Resistant     VANCOMYCIN <=0.5 SENSITIVE Sensitive     TRIMETH/SULFA <=10 SENSITIVE Sensitive     CLINDAMYCIN RESISTANT Resistant     RIFAMPIN <=0.5 SENSITIVE Sensitive     Inducible Clindamycin POSITIVE Resistant     * STAPHYLOCOCCUS CAPITIS  Blood Culture ID Panel (Reflexed)     Status:  Abnormal   Collection Time: 04/21/23  2:36 AM  Result Value Ref Range Status   Enterococcus faecalis NOT DETECTED NOT DETECTED Final   Enterococcus Faecium NOT DETECTED NOT DETECTED Final   Listeria monocytogenes NOT DETECTED NOT DETECTED Final   Staphylococcus species DETECTED (A) NOT DETECTED Final    Comment: CRITICAL RESULT CALLED TO, READ BACK BY AND VERIFIED WITH: JASON ROBINS AT 2213 ON 04/21/23 BY SS    Staphylococcus aureus (BCID) NOT DETECTED NOT DETECTED Final   Staphylococcus epidermidis DETECTED (A) NOT DETECTED Final    Comment: Methicillin (oxacillin) resistant coagulase negative staphylococcus. Possible blood culture contaminant (unless isolated from more than one blood culture draw or clinical case suggests pathogenicity). No antibiotic treatment is indicated for blood  culture contaminants. CRITICAL RESULT CALLED TO, READ BACK BY AND VERIFIED WITH: JASON ROBINS AT 2213 ON 04/21/23 BY SS    Staphylococcus lugdunensis NOT DETECTED NOT DETECTED Final   Streptococcus species NOT DETECTED NOT DETECTED Final   Streptococcus agalactiae NOT DETECTED NOT DETECTED Final   Streptococcus pneumoniae NOT DETECTED NOT DETECTED Final   Streptococcus pyogenes NOT DETECTED NOT DETECTED Final   A.calcoaceticus-baumannii NOT DETECTED NOT DETECTED Final   Bacteroides fragilis NOT DETECTED NOT DETECTED Final   Enterobacterales NOT DETECTED NOT DETECTED Final   Enterobacter cloacae complex NOT DETECTED NOT DETECTED Final   Escherichia coli NOT DETECTED NOT DETECTED Final   Klebsiella aerogenes NOT DETECTED NOT DETECTED Final   Klebsiella oxytoca NOT DETECTED NOT DETECTED Final   Klebsiella pneumoniae NOT DETECTED NOT DETECTED Final   Proteus species NOT DETECTED NOT DETECTED Final   Salmonella species NOT DETECTED NOT DETECTED Final   Serratia marcescens NOT DETECTED NOT DETECTED Final   Haemophilus influenzae NOT DETECTED NOT DETECTED Final   Neisseria meningitidis NOT DETECTED NOT  DETECTED Final   Pseudomonas aeruginosa NOT DETECTED NOT DETECTED Final   Stenotrophomonas maltophilia NOT DETECTED NOT DETECTED Final   Candida albicans NOT DETECTED NOT DETECTED Final   Candida auris NOT DETECTED NOT DETECTED Final  Candida glabrata NOT DETECTED NOT DETECTED Final   Candida krusei NOT DETECTED NOT DETECTED Final   Candida parapsilosis NOT DETECTED NOT DETECTED Final   Candida tropicalis NOT DETECTED NOT DETECTED Final   Cryptococcus neoformans/gattii NOT DETECTED NOT DETECTED Final   Methicillin resistance mecA/C DETECTED (A) NOT DETECTED Final    Comment: CRITICAL RESULT CALLED TO, READ BACK BY AND VERIFIED WITH: JASON ROBINS AT 2213 ON 04/21/23 BY SS Performed at [facility], [address], [place], [place] [address]   Urine Culture (for pregnant, neutropenic or urologic patients or patients with an indwelling urinary catheter)     Status: None   Collection Time: 04/22/23  5:22 PM   Specimen: Urine, Clean Catch  Result Value Ref Range Status   Specimen Description   Final    URINE, CLEAN CATCH Performed at [facility], [address], [place], [place] [address]    Special Requests   Final    NONE Performed at [facility], [address] [place], [place] [address]    Culture   Final    NO GROWTH Performed at [facility], [address] [place], [place] [address]    Report Status 04/23/2023 FINAL  Final  Culture, blood (Routine X 2) w Reflex to ID Panel     Status: None (Preliminary result)   Collection Time: 04/23/23  6:10 AM   Specimen: BLOOD  Result Value Ref Range Status   Specimen Description   Final    BLOOD  LEFT HAND Performed at [facility], [address] [place], [place] [address]    Special Requests   Final    BOTTLES DRAWN AEROBIC ONLY Blood Culture results may not be optimal due to an inadequate volume of blood received in culture bottles Performed at [facility], [address] [place], [place] [address]    Culture  Setup Time   Final    GRAM POSITIVE COCCI AEROBIC BOTTLE ONLY CRITICAL RESULT CALLED TO, READ BACK BY AND VERIFIED WITH: DEVON MITCHELL 04/24/2023 1422 CP Performed at [facility], [address] [place], [place] [address]    Culture   Final    Romie Minus POSITIVE COCCI IDENTIFICATION TO FOLLOW Performed at [facility], [address] [place], [place] [address]    Report Status PENDING  Incomplete  Culture, blood (Routine X 2) w Reflex to ID Panel     Status: None (Preliminary result)   Collection Time: 04/23/23  6:21 AM   Specimen: BLOOD  Result Value Ref Range Status   Specimen Description BLOOD  RIGHT HAND  Final   Special Requests   Final    BOTTLES DRAWN AEROBIC AND ANAEROBIC Blood Culture adequate volume   Culture   Final    NO GROWTH 3 DAYS Performed at [facility], [address], [place], [place] [address]    Report Status PENDING  Incomplete    Coagulation Studies: No results for input(s): "LABPROT", "INR" in the last 72 hours.  Urinalysis: No results for input(s): "COLORURINE", "LABSPEC", "PHURINE", "GLUCOSEU", "HGBUR", "BILIRUBINUR", "KETONESUR", "PROTEINUR", "UROBILINOGEN", "NITRITE", "LEUKOCYTESUR" in the last 72 hours.  Invalid input(s): "APPERANCEUR"    Imaging: No results found.   Medications:    albumin human 12.5 g (04/26/23 1027)    vitamin C  500 mg Oral BID   Chlorhexidine Gluconate Cloth  6 each Topical Daily   copper  8 mg Oral Daily   enoxaparin (LOVENOX) injection  0.5  mg/kg Subcutaneous Q24H   feeding supplement (KATE FARMS STANDARD 1.4)  325 mL Oral TID BM   folic acid  1 mg Oral Daily   hydrocortisone sod succinate (SOLU-CORTEF) inj  100 mg Intravenous Daily   insulin aspart  0-15 Units Subcutaneous TID WC   levothyroxine  100 mcg Oral Q0600   lipase/protease/amylase  12,000 Units Oral TID AC   midodrine  10 mg Oral TID WC   multivitamin  15 mL Oral Daily    pantoprazole (PROTONIX) IV  40 mg Intravenous Q24H   polyethylene glycol  17 g Oral Daily   thiamine  100 mg Oral Daily   vancomycin variable dose per unstable renal function (pharmacist dosing)   Does not apply See admin instructions   vitamin A  10,000 Units Oral Daily   Vitamin D (Ergocalciferol)  50,000 Units Oral Q7 days   vitamin E  400 Units Oral Daily   zinc sulfate  220 mg Oral Daily   polyethylene glycol  Assessment/ Plan:  47 y.o. male with super morbid obesity, hypertension, obstructive sleep apnea, lymphedema, history of bariatric surgery, cholelithiasis, severe hypothyroidism, bedridden status, history of IVC filter placement admitted on 04/21/2023 for Severe hypothyroidism [E03.9] Generalized weakness [R53.1] Lower limb ulcer, ankle, right, with unspecified severity (HCC) [L97.319] Hypothyroidism, unspecified type [E03.9] Sepsis, due to unspecified organism, unspecified whether acute organ dysfunction present (HCC) [A41.9]   Acute kidney injury Likely secondary to hemodynamic alterations caused by septic shock and hypotension leading to ATN. Patient is nonoliguric. Low urine output is noted but inaccurate collection due to incontinence and also fluid losses from wounds in the legs. Monitor for now.  No acute indication for dialysis. Supportive care.  Generalized edema Patient has hypoalbuminemia and malnutrition. Will supplement low-dose IV albumin to mobilize third spaced fluid.  Hyponatremia Likely secondary to AKI and generalized volume overload.  Hypotension Agree with midodrine.  Chronic systolic CHF LVEF 35 to 40%, moderately decreased function, global hypokinesis, grade 1 diastolic dysfunction, normal right ventricular systolic function from echo 04/22/2023.    LOS: 5 Jalexis Breed 4/28/202412:07 PM  [facility] [place], [place] [phone]  Note: This note was prepared with Dragon dictation. Any transcription errors are  unintentional

## 2023-04-26 NOTE — Progress Notes (Signed)
       CROSS COVER NOTE  NAME: ERNEST POPOWSKI MRN: [id number] DOB : [birth year] ATTENDING PHYSICIAN: Leeroy Bock, MD    Date of Service   04/26/2023   HPI/Events of Note   Report/Request Notified of Hypotension --->BP 78/59 MAP 66. Mr Meeker is asymptomatic and has had soft pressures throughout the day prompting transfer to [facility] [place] [facility].  Update: Manual 98/76 MAP 83   Interventions   Assessment/Plan: Albumin 12.5mg  x1 Midorine 10 mg x1 Nursing to obtain Manual BP before contacting provider      To reach the provider On-Call:   7AM- 7PM see care teams to locate the attending and reach out to them via [email or web address] 7PM-7AM contact night-coverage If you still have difficulty reaching the appropriate provider, please page the [facility] (Director on Call) for [facility] on amion for assistance  This document was prepared using Conservation officer, historic buildings and may include unintentional dictation errors.  Bishop Limbo DNP, MBA, FNP-BC, PMHNP-BC Nurse Practitioner [facility] Pager [phone]

## 2023-04-27 DIAGNOSIS — R531 Weakness: Secondary | ICD-10-CM | POA: Diagnosis not present

## 2023-04-27 DIAGNOSIS — E039 Hypothyroidism, unspecified: Secondary | ICD-10-CM | POA: Diagnosis not present

## 2023-04-27 DIAGNOSIS — L97319 Non-pressure chronic ulcer of right ankle with unspecified severity: Secondary | ICD-10-CM | POA: Diagnosis not present

## 2023-04-27 DIAGNOSIS — A419 Sepsis, unspecified organism: Secondary | ICD-10-CM | POA: Diagnosis not present

## 2023-04-27 LAB — COMP PANEL: LEUKEMIA/LYMPHOMA

## 2023-04-27 LAB — CBC WITH DIFFERENTIAL/PLATELET
Abs Immature Granulocytes: 0.9 10*3/uL — ABNORMAL HIGH (ref 0.00–0.07)
Basophils Absolute: 0 10*3/uL (ref 0.0–0.1)
Basophils Relative: 0 %
Eosinophils Absolute: 0.2 10*3/uL (ref 0.0–0.5)
Eosinophils Relative: 1 %
HCT: 22.2 % — ABNORMAL LOW (ref 39.0–52.0)
Hemoglobin: 7.2 g/dL — ABNORMAL LOW (ref 13.0–17.0)
Immature Granulocytes: 3 %
Lymphocytes Relative: 13 %
Lymphs Abs: 3.6 10*3/uL (ref 0.7–4.0)
MCH: 27.6 pg (ref 26.0–34.0)
MCHC: 32.4 g/dL (ref 30.0–36.0)
MCV: 85.1 fL (ref 80.0–100.0)
Monocytes Absolute: 1.4 10*3/uL — ABNORMAL HIGH (ref 0.1–1.0)
Monocytes Relative: 5 %
Neutro Abs: 21 10*3/uL — ABNORMAL HIGH (ref 1.7–7.7)
Neutrophils Relative %: 78 %
Platelets: 440 10*3/uL — ABNORMAL HIGH (ref 150–400)
RBC: 2.61 MIL/uL — ABNORMAL LOW (ref 4.22–5.81)
RDW: 20.6 % — ABNORMAL HIGH (ref 11.5–15.5)
Smear Review: NORMAL
WBC: 27.1 10*3/uL — ABNORMAL HIGH (ref 4.0–10.5)
nRBC: 0 % (ref 0.0–0.2)

## 2023-04-27 LAB — COMPREHENSIVE METABOLIC PANEL
ALT: 17 U/L (ref 0–44)
ALT: 19 U/L (ref 0–44)
AST: 14 U/L — ABNORMAL LOW (ref 15–41)
AST: 16 U/L (ref 15–41)
Albumin: 2.1 g/dL — ABNORMAL LOW (ref 3.5–5.0)
Albumin: 2.4 g/dL — ABNORMAL LOW (ref 3.5–5.0)
Alkaline Phosphatase: 99 U/L (ref 38–126)
Alkaline Phosphatase: 104 U/L (ref 38–126)
Anion gap: 12 (ref 5–15)
Anion gap: 11 (ref 5–15)
BUN: 37 mg/dL — ABNORMAL HIGH (ref 6–20)
BUN: 37 mg/dL — ABNORMAL HIGH (ref 6–20)
CO2: 16 mmol/L — ABNORMAL LOW (ref 22–32)
CO2: 16 mmol/L — ABNORMAL LOW (ref 22–32)
Calcium: 8 mg/dL — ABNORMAL LOW (ref 8.9–10.3)
Calcium: 8 mg/dL — ABNORMAL LOW (ref 8.9–10.3)
Chloride: 103 mmol/L (ref 98–111)
Chloride: 102 mmol/L (ref 98–111)
Creatinine, Ser: 1.36 mg/dL — ABNORMAL HIGH (ref 0.61–1.24)
Creatinine, Ser: 1.4 mg/dL — ABNORMAL HIGH (ref 0.61–1.24)
GFR, Estimated: >60 mL/min (ref >60)
GFR, Estimated: >60 mL/min (ref >60)
Glucose, Bld: 91 mg/dL (ref 70–99)
Glucose, Bld: 95 mg/dL (ref 70–99)
Potassium: 4.6 mmol/L (ref 3.5–5.1)
Potassium: 4.5 mmol/L (ref 3.5–5.1)
Sodium: 131 mmol/L — ABNORMAL LOW (ref 135–145)
Sodium: 129 mmol/L — ABNORMAL LOW (ref 135–145)
Total Bilirubin: 1 mg/dL (ref 0.3–1.2)
Total Bilirubin: 1.1 mg/dL (ref 0.3–1.2)
Total Protein: 5 g/dL — ABNORMAL LOW (ref 6.5–8.1)
Total Protein: 5.2 g/dL — ABNORMAL LOW (ref 6.5–8.1)

## 2023-04-27 LAB — BCR-ABL1 FISH
Cells Analyzed: 200
Cells Counted: 200

## 2023-04-27 LAB — KAPPA/LAMBDA LIGHT CHAINS
Kappa free light chain: 48.8 mg/L — ABNORMAL HIGH (ref 3.3–19.4)
Kappa, lambda light chain ratio: 0.91 (ref 0.26–1.65)
Lambda free light chains: 53.6 mg/L — ABNORMAL HIGH (ref 5.7–26.3)

## 2023-04-27 LAB — PHOSPHORUS: Phosphorus: 2.7 mg/dL (ref 2.5–4.6)

## 2023-04-27 LAB — CBC
HCT: 23.3 % — ABNORMAL LOW (ref 39.0–52.0)
HCT: 27.1 % — ABNORMAL LOW (ref 39.0–52.0)
Hemoglobin: 7.4 g/dL — ABNORMAL LOW (ref 13.0–17.0)
Hemoglobin: 8.7 g/dL — ABNORMAL LOW (ref 13.0–17.0)
MCH: 27.3 pg (ref 26.0–34.0)
MCH: 27.3 pg (ref 26.0–34.0)
MCHC: 31.8 g/dL (ref 30.0–36.0)
MCHC: 32.1 g/dL (ref 30.0–36.0)
MCV: 86 fL (ref 80.0–100.0)
MCV: 85 fL (ref 80.0–100.0)
Platelets: 450 10*3/uL — ABNORMAL HIGH (ref 150–400)
Platelets: 369 10*3/uL (ref 150–400)
RBC: 2.71 MIL/uL — ABNORMAL LOW (ref 4.22–5.81)
RBC: 3.19 MIL/uL — ABNORMAL LOW (ref 4.22–5.81)
RDW: 21 % — ABNORMAL HIGH (ref 11.5–15.5)
RDW: 20 % — ABNORMAL HIGH (ref 11.5–15.5)
WBC: 29.1 10*3/uL — ABNORMAL HIGH (ref 4.0–10.5)
WBC: 25.3 10*3/uL — ABNORMAL HIGH (ref 4.0–10.5)
nRBC: 0 % (ref 0.0–0.2)
nRBC: 0 % (ref 0.0–0.2)

## 2023-04-27 LAB — ABO/RH: ABO/RH(D): O POS

## 2023-04-27 LAB — TYPE AND SCREEN

## 2023-04-27 LAB — GLUCOSE, CAPILLARY
Glucose-Capillary: 86 mg/dL (ref 70–99)
Glucose-Capillary: 88 mg/dL (ref 70–99)
Glucose-Capillary: 115 mg/dL — ABNORMAL HIGH (ref 70–99)

## 2023-04-27 LAB — BPAM RBC
Blood Product Expiration Date: 202405292359
Unit Type and Rh: 5100

## 2023-04-27 LAB — VANCOMYCIN, RANDOM: Vancomycin Rm: 53 ug/mL

## 2023-04-27 LAB — CULTURE, BLOOD (ROUTINE X 2)
Culture: NO GROWTH
Special Requests: ADEQUATE

## 2023-04-27 LAB — PREPARE RBC (CROSSMATCH)

## 2023-04-27 MED ORDER — SODIUM CHLORIDE 0.9% IV SOLUTION: Freq: Once | INTRAVENOUS | Status: AC

## 2023-04-27 MED ORDER — LACTASE 3000 UNITS PO TABS
6000.0000 [IU] | ORAL_TABLET | Freq: Three times a day (TID) | ORAL | Status: DC
  Administered 2023-04-27 – 2023-05-23 (×73): 6000 [IU] via ORAL
  Filled 2023-04-27 (×80): qty 2

## 2023-04-27 NOTE — Progress Notes (Signed)
Nutrition Follow Up Note   DOCUMENTATION CODES:   Non-severe (moderate) malnutrition in context of chronic illness  INTERVENTION:   Molli Posey supplement chocolate po TID- each supplement provides 325kcal and 16g protein  Liquid MVI daily   Vitamin C 500mg  po BID  Folic acid 1mg  po daily   Thiamine 100mg  po daily   Copper 8mg  po daily   Vitamin A 10,000 units po daily   Ergocalciferol 50,000 units po weekly  Vitamin E 400 units po daily   Zinc 220mg  po daily    Pt at high refeed risk; recommend monitor potassium, magnesium and phosphorus labs daily until stable  Lactaid with meals   NUTRITION DIAGNOSIS:   Moderate Malnutrition related to chronic illness (h/o roux-en-y gastric bypass) as evidenced by moderate fat depletion, moderate muscle depletion, 35 percent weight loss in one year. -ongoing   GOAL:   Patient will meet greater than or equal to 90% of their needs -not met   MONITOR:   PO intake, Supplement acceptance, Labs, Weight trends, Skin, I & O's  ASSESSMENT:   47 y/o male with h/o hypothyroidism, anxiety, morbid obesity s/p roux-en-y (2013), anxiety, HTN, OSA, GERD, IVC filter, H. pylori, lymphedema and cholelithiasis who is admitted with septic shock.  Met with pt in room today. Pt documented to be refusing meals and supplements. Pt reports that he is fearful to eat as he reports that everything he eats gives him diarrhea. Pt believes that his diarrhea is coming from lactose but reports that he has diarrhea even when he does not eat or drink. Pt reports diarrhea with the [facility]. Pt initiated on creon but often refuses. Will plan to try lactaid with meals today. RD discussed again with pt the importance of adequate nutrition needed to preserve lean muscle and to support wound healing. Pt requesting imodium; pt has prn imodium ordered. Pt reports that his mom is bringing in some chicken for him to eat today. RD encouraged pt to eat regardless of the  diarrhea so he can get better. RD discussed NGT placement and nutrition support with pt as the next step if he does not eat. Pt reports that he will try to eat more. Pt has numerous vitamin labs that have returned low; these are being supplemented. Vitamin K returned low; this was discussed with MD and will leave supplementation per MD. Per chart, pt appears to be down ~20lbs since admission; RD unsure if bed weights are accurate.   Medications reviewed and include: vitamin C, copper, folic acid, heparin, solu-cortef, insulin, synthroid, creon, protonix, miralax, thiamine, vitamin A, ergocalciferol, vitamin E, zinc  Labs reviewed: Na 129(L), K 4.5 wnl, BUN 37(H), creat 1.40(H), P 2.7 wnl Iron 21(L), TIBC 113(L), ferritin 116, folate 6.6 wnl- 4/24 Copper 59(L), B1 151.4 wnl, Vitamin D- 25.81(L), vitamin A, B12 2353(H), vitamin C 0.2(L), vitamin E 7.4 wnl, zinc 35(L), vitamin K <1.0(L)- 4/24  Diet Order:   Diet Order             Diet regular Room service appropriate? Yes with Assist; Fluid consistency: Thin  Diet effective now                  EDUCATION NEEDS:   Education needs have been addressed  Skin:  Skin Assessment: Reviewed RN Assessment (Right lateral malleolus:  0.3 cm x 0.2 cm x 0.1 cm, right buttocks ( in skin fold) 1 cm, edema to bilateral lower legs  nonintact lesion to left upper thigh: 3 cm  x 5.2 cm)  Last BM:  4/28- type 6  Height:   Ht Readings from Last 1 Encounters:  04/21/23 5\' 5"  (1.651 m)    Weight:   Wt Readings from Last 1 Encounters:  04/26/23 (!) 171.3 kg    Ideal Body Weight:  61.8 kg  BMI:  Body mass index is 62.84 kg/m.  Estimated Nutritional Needs:   Kcal:  2700-3000kcal/day  Protein:  >135g/day  Fluid:  1.9-2.2L/day  Christopher Holiday MS, RD, LDN Please refer to [facility] for RD and/or RD on-call/weekend/after hours pager

## 2023-04-27 NOTE — Progress Notes (Signed)
[facility], [place] 04/27/23  Subjective:   Hospital day # 6  Patient seen and evaluated in ICU Sodium 129 Serum creatinine continues to rise Patient has pure wick  Skin continues to weep Tolerating meals  Renal: 04/28 0701 - 04/29 0700 In: 364.6 [P.O.:300; IV Piggyback:64.6] Out: 1050 [Urine:1050] Lab Results  Component Value Date   CREATININE 1.40 (H) 04/27/2023   CREATININE 1.33 (H) 04/26/2023   CREATININE 1.36 (H) 04/26/2023     Objective:  Vital signs in last 24 hours:  Temp:  [97.4 F (36.3 C)-98.7 F (37.1 C)] 97.7 F (36.5 C) (04/29 0200) Pulse Rate:  [74-121] 121 (04/29 1000) Resp:  [11-22] 11 (04/29 0300) BP: (81-102)/(58-80) 96/80 (04/29 1000) SpO2:  [98 %-100 %] 100 % (04/29 1000) Weight:  [171.3 kg] 171.3 kg (04/28 1336)  Weight change:  Filed Weights   04/22/23 0500 04/23/23 0600 04/26/23 1336  Weight: (!) 176 kg (!) 180.3 kg (!) 171.3 kg    Intake/Output:    Intake/Output Summary (Last 24 hours) at 04/27/2023 1120 Last data filed at 04/27/2023 0304 Gross per 24 hour  Intake 364.57 ml  Output 1050 ml  Net -685.43 ml      Physical Exam: General: Super morbidly obese gentleman, laying in the bed  HEENT Moist oral mucous membranes  Pulm/lungs Normal breathing effort room air  CVS/Heart No rub  Abdomen:  Obese, nontender  Extremities: Significant dependent peripheral edema on the thighs  Neurologic: Alert, oriented  Skin: Lichenified skin over calves and lower thighs  Dialysis Access: None at present       Basic Metabolic Panel:  Recent Labs  Lab 04/21/23 0426 04/21/23 1505 04/22/23 0556 04/23/23 0535 04/24/23 0430 04/25/23 0540 04/26/23 0140 04/26/23 0455 04/27/23 0623 04/27/23 0907  NA 130*   < > 127* 130* 126* 128* 131* 128*  --  129*  K 3.1*   < > 3.3* 3.3* 3.2* 4.0 4.6 4.8  --  4.5  CL 100   < > 97* 98 96* 101 103 103  --  102  CO2 20*   < > 22 20* 20* 16* 16* 15*  --  16*  GLUCOSE 141*    < > 131* 102* 116* 114* 91 94  --  95  BUN 26*   < > 29* 31* 36* 37* 37* 37*  --  37*  CREATININE 0.74   < > 0.87 0.89 1.06 1.27* 1.36* 1.33*  --  1.40*  CALCIUM 7.3*   < > 7.7* 7.8* 7.5* 7.7* 8.0* 7.8*  --  8.0*  MG 2.1  --  2.1 2.0 2.0  --   --   --   --   --   PHOS 3.2  --  2.9 2.8 2.9 3.0  --  2.7 2.7  --    < > = values in this interval not displayed.      CBC: Recent Labs  Lab 04/23/23 0535 04/24/23 0430 04/25/23 0540 04/26/23 0140 04/26/23 0455 04/27/23 0623  WBC 44.4* 32.4* 33.2* 29.1* 31.3* 27.1*  NEUTROABS 38.4* 29.1* 29.1*  --  24.9* 21.0*  HGB 8.2* 7.7* 8.2* 7.4* 7.5* 7.2*  HCT 25.7* 23.6* 25.8* 23.3* 23.5* 22.2*  MCV 84.5 84.6 85.4 86.0 84.5 85.1  PLT 477* 431* 514* 450* 462* 440*      No results found for: "HEPBSAG", "HEPBSAB", "HEPBIGM"    Microbiology:  Recent Results (from the past 240 hour(s))  Blood Culture (routine x 2)  Status: Abnormal   Collection Time: 04/21/23  2:02 AM   Specimen: BLOOD  Result Value Ref Range Status   Specimen Description   Final    BLOOD RIGHT ARM Performed at [facility], [address], [place], [place] [address]    Special Requests   Final    BOTTLES DRAWN AEROBIC AND ANAEROBIC Blood Culture adequate volume Performed at [facility], [address] [place], [place] [address]    Culture  Setup Time   Final    GRAM POSITIVE COCCI IN BOTH AEROBIC AND ANAEROBIC BOTTLES CRITICAL VALUE NOTED.  VALUE IS CONSISTENT WITH PREVIOUSLY REPORTED AND CALLED VALUE. Performed at [facility], [address] [place], [place] [address]    Culture (A)  Final    STAPHYLOCOCCUS CAPITIS STAPHYLOCOCCUS AURICULARIS    Report Status 04/26/2023 FINAL  Final   Organism ID, Bacteria STAPHYLOCOCCUS CAPITIS  Final   Organism ID, Bacteria STAPHYLOCOCCUS AURICULARIS  Final      Susceptibility   Staphylococcus auricularis - MIC*    CIPROFLOXACIN 4 RESISTANT Resistant     ERYTHROMYCIN >=8 RESISTANT  Resistant     GENTAMICIN <=0.5 SENSITIVE Sensitive     OXACILLIN >=4 RESISTANT Resistant     TETRACYCLINE <=1 SENSITIVE Sensitive     VANCOMYCIN <=0.5 SENSITIVE Sensitive     TRIMETH/SULFA <=10 SENSITIVE Sensitive     CLINDAMYCIN RESISTANT Resistant     RIFAMPIN <=0.5 SENSITIVE Sensitive     Inducible Clindamycin POSITIVE Resistant     * STAPHYLOCOCCUS AURICULARIS   Staphylococcus capitis - MIC*    CIPROFLOXACIN 4 RESISTANT Resistant     ERYTHROMYCIN >=8 RESISTANT Resistant     GENTAMICIN <=0.5 SENSITIVE Sensitive     OXACILLIN >=4 RESISTANT Resistant     TETRACYCLINE >=16 RESISTANT Resistant     VANCOMYCIN <=0.5 SENSITIVE Sensitive     TRIMETH/SULFA <=10 SENSITIVE Sensitive     CLINDAMYCIN INTERMEDIATE Intermediate     RIFAMPIN 1 SENSITIVE Sensitive     Inducible Clindamycin NEGATIVE Sensitive     * STAPHYLOCOCCUS CAPITIS  SARS Coronavirus 2 by RT PCR (hospital order, performed in [facility] hospital lab) *cepheid single result test* Anterior Nasal Swab     Status: None   Collection Time: 04/21/23  2:18 AM   Specimen: Anterior Nasal Swab  Result Value Ref Range Status   SARS Coronavirus 2 by RT PCR NEGATIVE NEGATIVE Final    Comment: (NOTE) SARS-CoV-2 target nucleic acids are NOT DETECTED.  The SARS-CoV-2 RNA is generally detectable in upper and lower respiratory specimens during the acute phase of infection. The lowest concentration of SARS-CoV-2 viral copies this assay can detect is 250 copies / mL. A negative result does not preclude SARS-CoV-2 infection and should not be used as the sole basis for treatment or other patient management decisions.  A negative result may occur with improper specimen collection / handling, submission of specimen other than nasopharyngeal swab, presence of viral mutation(s) within the areas targeted by this assay, and inadequate number of viral copies (<250 copies / mL). A negative result must be combined with clinical observations, patient  history, and epidemiological information.  Fact Sheet for Patients:   RoadLapTop.co.za  Fact Sheet for Healthcare Providers: http://kim-miller.com/  This test is not yet approved or  cleared by the [place] FDA and has been authorized for detection and/or diagnosis of SARS-CoV-2 by FDA under an Emergency Use Authorization (EUA).  This EUA will remain in effect (meaning this test can be used)  for the duration of the COVID-19 declaration under Section 564(b)(1) of the Act, 21 U.S.C. section 360bbb-3(b)(1), unless the authorization is terminated or revoked sooner.  Performed at [facility], [address], [place], [place] [address]   Blood Culture (routine x 2)     Status: Abnormal   Collection Time: 04/21/23  2:36 AM   Specimen: BLOOD  Result Value Ref Range Status   Specimen Description   Final    BLOOD  LEFT [facility] Performed at [facility], [address], [place], [place] [address]    Special Requests   Final    BOTTLES DRAWN AEROBIC AND ANAEROBIC Blood Culture adequate volume Performed at [facility], [address] [place], [place] [address]    Culture  Setup Time   Final    GRAM POSITIVE COCCI AEROBIC BOTTLE ONLY CRITICAL RESULT CALLED TO, READ BACK BY AND VERIFIED WITH: JASON ROBINS AT 2213 ON 04/21/23 BY SS    Culture (A)  Final    STAPHYLOCOCCUS CAPITIS UNABLE TO RECOVER SEPI IN CULTURE Performed at [facility], [address] [place], [place] [address]    Report Status 04/26/2023 FINAL  Final   Organism ID, Bacteria STAPHYLOCOCCUS CAPITIS  Final      Susceptibility   Staphylococcus capitis - MIC*    CIPROFLOXACIN <=0.5 SENSITIVE Sensitive     ERYTHROMYCIN >=8 RESISTANT Resistant     GENTAMICIN <=0.5 SENSITIVE Sensitive     OXACILLIN <=0.25 SENSITIVE Sensitive     TETRACYCLINE >=16 RESISTANT Resistant     VANCOMYCIN <=0.5 SENSITIVE Sensitive     TRIMETH/SULFA <=10  SENSITIVE Sensitive     CLINDAMYCIN RESISTANT Resistant     RIFAMPIN <=0.5 SENSITIVE Sensitive     Inducible Clindamycin POSITIVE Resistant     * STAPHYLOCOCCUS CAPITIS  Blood Culture ID Panel (Reflexed)     Status: Abnormal   Collection Time: 04/21/23  2:36 AM  Result Value Ref Range Status   Enterococcus faecalis NOT DETECTED NOT DETECTED Final   Enterococcus Faecium NOT DETECTED NOT DETECTED Final   Listeria monocytogenes NOT DETECTED NOT DETECTED Final   Staphylococcus species DETECTED (A) NOT DETECTED Final    Comment: CRITICAL RESULT CALLED TO, READ BACK BY AND VERIFIED WITH: JASON ROBINS AT 2213 ON 04/21/23 BY SS    Staphylococcus aureus (BCID) NOT DETECTED NOT DETECTED Final   Staphylococcus epidermidis DETECTED (A) NOT DETECTED Final    Comment: Methicillin (oxacillin) resistant coagulase negative staphylococcus. Possible blood culture contaminant (unless isolated from more than one blood culture draw or clinical case suggests pathogenicity). No antibiotic treatment is indicated for blood  culture contaminants. CRITICAL RESULT CALLED TO, READ BACK BY AND VERIFIED WITH: JASON ROBINS AT 2213 ON 04/21/23 BY SS    Staphylococcus lugdunensis NOT DETECTED NOT DETECTED Final   Streptococcus species NOT DETECTED NOT DETECTED Final   Streptococcus agalactiae NOT DETECTED NOT DETECTED Final   Streptococcus pneumoniae NOT DETECTED NOT DETECTED Final   Streptococcus pyogenes NOT DETECTED NOT DETECTED Final   A.calcoaceticus-baumannii NOT DETECTED NOT DETECTED Final   Bacteroides fragilis NOT DETECTED NOT DETECTED Final   Enterobacterales NOT DETECTED NOT DETECTED Final   Enterobacter cloacae complex NOT DETECTED NOT DETECTED Final   Escherichia coli NOT DETECTED NOT DETECTED Final   Klebsiella aerogenes NOT DETECTED NOT DETECTED Final   Klebsiella oxytoca NOT DETECTED NOT DETECTED Final   Klebsiella pneumoniae NOT DETECTED NOT DETECTED Final   Proteus species NOT DETECTED NOT DETECTED  Final  Salmonella species NOT DETECTED NOT DETECTED Final   Serratia marcescens NOT DETECTED NOT DETECTED Final   Haemophilus influenzae NOT DETECTED NOT DETECTED Final   Neisseria meningitidis NOT DETECTED NOT DETECTED Final   Pseudomonas aeruginosa NOT DETECTED NOT DETECTED Final   Stenotrophomonas maltophilia NOT DETECTED NOT DETECTED Final   Candida albicans NOT DETECTED NOT DETECTED Final   Candida auris NOT DETECTED NOT DETECTED Final   Candida glabrata NOT DETECTED NOT DETECTED Final   Candida krusei NOT DETECTED NOT DETECTED Final   Candida parapsilosis NOT DETECTED NOT DETECTED Final   Candida tropicalis NOT DETECTED NOT DETECTED Final   Cryptococcus neoformans/gattii NOT DETECTED NOT DETECTED Final   Methicillin resistance mecA/C DETECTED (A) NOT DETECTED Final    Comment: CRITICAL RESULT CALLED TO, READ BACK BY AND VERIFIED WITH: JASON ROBINS AT 2213 ON 04/21/23 BY SS Performed at [facility], [address], [place], [place] [address]   Urine Culture (for pregnant, neutropenic or urologic patients or patients with an indwelling urinary catheter)     Status: None   Collection Time: 04/22/23  5:22 PM   Specimen: Urine, Clean Catch  Result Value Ref Range Status   Specimen Description   Final    URINE, CLEAN CATCH Performed at [facility], [address], [place], [place] [address]    Special Requests   Final    NONE Performed at [facility], [address], [place], [place] [address]    Culture   Final    NO GROWTH Performed at [facility], [address], [place], [place] [address]    Report Status 04/23/2023 FINAL  Final  Culture, blood (Routine X 2) w Reflex to ID Panel     Status: Abnormal   Collection Time: 04/23/23  6:10 AM   Specimen: BLOOD  Result Value Ref Range Status   Specimen Description   Final    BLOOD  LEFT HAND Performed at [facility], [address] [place], [place] [address]    Special  Requests   Final    BOTTLES DRAWN AEROBIC ONLY Blood Culture results may not be optimal due to an inadequate volume of blood received in culture bottles Performed at [facility], [address] [place], [place] [address]    Culture  Setup Time   Final    GRAM POSITIVE COCCI AEROBIC BOTTLE ONLY CRITICAL RESULT CALLED TO, READ BACK BY AND VERIFIED WITH: DEVON MITCHELL 04/24/2023 1422 CP Performed at [facility], [address] [place], [place] [address]    Culture (A)  Final    STAPHYLOCOCCUS AURICULARIS SUSCEPTIBILITIES PERFORMED ON PREVIOUS CULTURE WITHIN THE LAST 5 DAYS. Performed at [facility], [address], [place], [place] [address]    Report Status 04/27/2023 FINAL  Final  Culture, blood (Routine X 2) w Reflex to ID Panel     Status: None (Preliminary result)   Collection Time: 04/23/23  6:21 AM   Specimen: BLOOD  Result Value Ref Range Status   Specimen Description BLOOD  RIGHT HAND  Final   Special Requests   Final    BOTTLES DRAWN AEROBIC AND ANAEROBIC Blood Culture adequate volume   Culture   Final    NO GROWTH 4 DAYS Performed at [facility], [address] [place], [place] [address]    Report Status PENDING  Incomplete    Coagulation Studies: No results for input(s): "LABPROT", "INR" in the last 72 hours.  Urinalysis: No  results for input(s): "COLORURINE", "LABSPEC", "PHURINE", "GLUCOSEU", "HGBUR", "BILIRUBINUR", "KETONESUR", "PROTEINUR", "UROBILINOGEN", "NITRITE", "LEUKOCYTESUR" in the last 72 hours.  Invalid input(s): "APPERANCEUR"    Imaging: No results found.   Medications:    albumin human 12.5 g (04/26/23 1027)    sodium chloride   Intravenous Once   vitamin C  500 mg Oral BID   Chlorhexidine Gluconate Cloth  6 each Topical Daily   copper  8 mg Oral Daily   feeding supplement (KATE FARMS STANDARD 1.4)  325 mL Oral TID BM   folic acid  1 mg Oral Daily   heparin injection (subcutaneous)  5,000 Units  Subcutaneous Q8H   hydrocortisone sod succinate (SOLU-CORTEF) inj  100 mg Intravenous Daily   insulin aspart  0-15 Units Subcutaneous TID WC   lactose free nutrition  237 mL Oral TID WC   levothyroxine  100 mcg Oral Q0600   lipase/protease/amylase  12,000 Units Oral TID AC   midodrine  10 mg Oral TID WC   multivitamin  15 mL Oral Daily   pantoprazole (PROTONIX) IV  40 mg Intravenous Q24H   polyethylene glycol  17 g Oral Daily   thiamine  100 mg Oral Daily   vancomycin variable dose per unstable renal function (pharmacist dosing)   Does not apply See admin instructions   vitamin A  10,000 Units Oral Daily   Vitamin D (Ergocalciferol)  50,000 Units Oral Q7 days   vitamin E  400 Units Oral Daily   zinc sulfate  220 mg Oral Daily   acetaminophen, bismuth subsalicylate, loperamide, polyethylene glycol  Assessment/ Plan:  47 y.o. male with super morbid obesity, hypertension, obstructive sleep apnea, lymphedema, history of bariatric surgery, cholelithiasis, severe hypothyroidism, bedridden status, history of IVC filter placement admitted on 04/21/2023 for Severe hypothyroidism [E03.9] Generalized weakness [R53.1] Lower limb ulcer, ankle, right, with unspecified severity (HCC) [L97.319] Hypothyroidism, unspecified type [E03.9] Sepsis, due to unspecified organism, unspecified whether acute organ dysfunction present (HCC) [A41.9]   Acute kidney injury Likely secondary to hemodynamic alterations caused by septic shock and hypotension leading to ATN. Patient is nonoliguric. Urine output has increased, 1L recorded in previous 24 hours.  Creatinine slightly increased. Will continue to monitor   Generalized edema Patient has hypoalbuminemia and malnutrition. Will supplement low-dose IV albumin to mobilize third spaced fluid.  Hyponatremia Likely secondary to AKI and generalized volume overload. Slowly improving  Hypotension, received pressor support in ICU.  Agree with  midodrine.  Chronic systolic CHF LVEF 35 to 40%, moderately decreased function, global hypokinesis, grade 1 diastolic dysfunction, normal right ventricular systolic function from echo 04/22/2023.    LOS: 6 [facility] 4/29/202411:20 AM  Central [address] [place], [place] [phone]  Note: This note was prepared with Dragon dictation. Any transcription errors are unintentional

## 2023-04-27 NOTE — Progress Notes (Addendum)
[facility]  Date of Admission:  04/21/2023   Principal Problem:   Severe hypothyroidism Active Problems:   Malnutrition of moderate degree (HCC)   Generalized weakness   Ulcer of right ankle (HCC)   Hypotension due to hypovolemia   Acute renal failure with tubular necrosis (HCC)   Hypothyroidism          Assessment: 47 year old male admitted with:  #Staph capitis in blood culture 01/29/2021-consistent with contamination #Staph auricularis positive blood cultures #Right IJ - Sensitivities do not match as one set is  oxacillin sensitive and the other is resistant - Staph auricularis grew 1 / 2 on 4/23 and 1/2 on 4/25.  Lab was called to run sensitivities on 4/25 Recommendations: -Repeat blood cultures - Follow-up sensitivities of staph auricularis from 4/25, cystis contamination -Vancomycin random is 53, continue for now by following levels per pharmacy  #Shock-resolved off of pressors #Pyuria, no GU symptoms since no indication to treat   #Leukocytosis - Oncology following, patient has elevated WBC at baseline  #Chronic lymphedema - Left lower extremity wound, on thigh.  No surrounding erythema. - Would engage wound care Microbiology:   Antibiotics: Ceftriaxone 4/24-26 Vancomycin 4/22-present with Randon level: 53  pip-tazo 4/23-24 Cefepime 4/22 Cultures: Blood 4/23 2/2 Staph capitis, 1/2 staph auricularis 4/25 1/2 staph auricularis    SUBJECTIVE: Resting in ved. NO new complaints Interval: Afebrile overnight. Wbc 27.1  Review of Systems: ROS   Scheduled Meds:  vitamin C  500 mg Oral BID   Chlorhexidine Gluconate Cloth  6 each Topical Daily   copper  8 mg Oral Daily   feeding supplement (KATE FARMS STANDARD 1.4)  325 mL Oral TID BM   folic acid  1 mg Oral Daily   heparin injection (subcutaneous)  5,000 Units Subcutaneous Q8H   hydrocortisone sod succinate (SOLU-CORTEF) inj  100 mg Intravenous Daily   insulin aspart   0-15 Units Subcutaneous TID WC   lactase  6,000 Units Oral TID WC   levothyroxine  100 mcg Oral Q0600   lipase/protease/amylase  12,000 Units Oral TID AC   midodrine  10 mg Oral TID WC   multivitamin  15 mL Oral Daily   pantoprazole (PROTONIX) IV  40 mg Intravenous Q24H   polyethylene glycol  17 g Oral Daily   thiamine  100 mg Oral Daily   vancomycin variable dose per unstable renal function (pharmacist dosing)   Does not apply See admin instructions   vitamin A  10,000 Units Oral Daily   Vitamin D (Ergocalciferol)  50,000 Units Oral Q7 days   vitamin E  400 Units Oral Daily   zinc sulfate  220 mg Oral Daily   Continuous Infusions: PRN Meds:.acetaminophen, bismuth subsalicylate, loperamide, polyethylene glycol No Known Allergies  OBJECTIVE: Vitals:   04/27/23 1335 04/27/23 1400 04/27/23 1430 04/27/23 1500  BP: 104/79 103/64 96/78 102/77  Pulse: (!) 109 (!) 102 85 85  Resp:  13 12 13   Temp: 98 F (36.7 C)     TempSrc: Axillary     SpO2: 100% 100% 100% 100%  Weight:      Height:       Body mass index is 62.84 kg/m.  Physical Exam Constitutional:      General: He is not in acute distress.    Appearance: He is normal weight. He is not toxic-appearing.  HENT:     Head: Normocephalic and atraumatic.     Right Ear: External  ear normal.     Left Ear: External ear normal.     Nose: No congestion or rhinorrhea.     Mouth/Throat:     Mouth: Mucous membranes are moist.     Pharynx: Oropharynx is clear.  Eyes:     Extraocular Movements: Extraocular movements intact.     Conjunctiva/sclera: Conjunctivae normal.     Pupils: Pupils are equal, round, and reactive to light.  Cardiovascular:     Rate and Rhythm: Normal rate and regular rhythm.     Heart sounds: No murmur heard.    No friction rub. No gallop.  Pulmonary:     Effort: Pulmonary effort is normal.     Breath sounds: Normal breath sounds.  Abdominal:     General: Abdomen is flat. Bowel sounds are normal.      Palpations: Abdomen is soft.  Musculoskeletal:        General: No swelling.     Cervical back: Normal range of motion and neck supple.     Comments: B/L LE with edema, ruddy appearance Left thigh 5 cm wound/ulcer without surrounding edema  Skin:    General: Skin is warm and dry.  Neurological:     General: No focal deficit present.     Mental Status: He is oriented to person, place, and time.  Psychiatric:        Mood and Affect: Mood normal.       Lab Results Lab Results  Component Value Date   WBC 27.1 (H) 04/27/2023   HGB 7.2 (L) 04/27/2023   HCT 22.2 (L) 04/27/2023   MCV 85.1 04/27/2023   PLT 440 (H) 04/27/2023    Lab Results  Component Value Date   CREATININE 1.40 (H) 04/27/2023   BUN 37 (H) 04/27/2023   NA 129 (L) 04/27/2023   K 4.5 04/27/2023   CL 102 04/27/2023   CO2 16 (L) 04/27/2023    Lab Results  Component Value Date   ALT 19 04/27/2023   AST 16 04/27/2023   ALKPHOS 104 04/27/2023   BILITOT 1.1 04/27/2023        Danelle Earthly, MD [facility] 04/27/2023, 3:12 PM

## 2023-04-27 NOTE — TOC Initial Note (Signed)
Transition of Care [facility]) - Initial/Assessment Note    Patient Details  Name: Christopher Herman MRN: [id number] Date of Birth: [birth year]  Transition of Care [facility]) CM/SW Contact:    Margarito Liner, LCSW Phone Number: 04/27/2023, 4:11 PM  Clinical Narrative:  CSW met with patient. No supports at bedside. CSW introduced role and explained that discharge planning would be discussed. Patient is agreeable to long-term placement. He does not have Medicaid or get a social security/disability check. Patient still works and is unsure if he will be able to return. Financial counselor will contact patient for Medicaid screening. Will start search once eligibility determined. Patient is aware that if he qualifies, we will more than likely need to look for out-of-county placement. Patient agreeable "as long as it's decent." No further concerns. CSW encouraged patient to contact CSW as needed. CSW iwll continue to follow patient for support and facilitate discharge once medically stable.                Expected Discharge Plan: Skilled Nursing Facility Barriers to Discharge: Continued Medical Work up   Patient Goals and CMS Choice            Expected Discharge Plan and Services     Post Acute Care Choice: Skilled Nursing Facility Living arrangements for the past 2 months: Single Family Home                                      Prior Living Arrangements/Services Living arrangements for the past 2 months: Single Family Home Lives with:: Parents Patient language and need for interpreter reviewed:: Yes Do you feel safe going back to the place where you live?: Yes      Need for Family Participation in Patient Care: Yes (Comment) Care giver support system in place?: Yes (comment)   Criminal Activity/Legal Involvement Pertinent to Current Situation/Hospitalization: No - Comment as needed  Activities of Daily Living      Permission Sought/Granted Permission sought to share information  with : Facility Industrial/product designer granted to share information with : Yes, Verbal Permission Granted     Permission granted to share info w AGENCY: SNF's        Emotional Assessment Appearance:: Appears stated age Attitude/Demeanor/Rapport: Engaged Affect (typically observed): Calm, Flat Orientation: : Oriented to Self, Oriented to Place, Oriented to  Time, Oriented to Situation Alcohol / Substance Use: Not Applicable Psych Involvement: No (comment)  Admission diagnosis:  Severe hypothyroidism [E03.9] Generalized weakness [R53.1] Lower limb ulcer, ankle, right, with unspecified severity (HCC) [L97.319] Hypothyroidism, unspecified type [E03.9] Sepsis, due to unspecified organism, unspecified whether acute organ dysfunction present [facility]) [A41.9] Patient Active Problem List   Diagnosis Date Noted   Acute renal failure with tubular necrosis (HCC) 04/26/2023   Hypothyroidism 04/26/2023   Hypotension due to hypovolemia 04/24/2023   Malnutrition of moderate degree (HCC) 04/23/2023   Generalized weakness 04/23/2023   Ulcer of right ankle (HCC) 04/23/2023   Severe hypothyroidism 04/21/2023   Chronic diarrhea of unknown origin 07/11/2022   Muscle spasm of both lower legs 02/20/2022   Severe protein-calorie malnutrition (HCC) 02/20/2022   Diarrhea    Rhabdomyolysis 02/14/2022   Electrolyte abnormality 02/13/2022   AKI (acute kidney injury) (HCC) 02/12/2022   Sepsis (HCC) 02/11/2022   S/P IVC filter 09/11/2015   Cholelithiasis 09/11/2015   Anxiety 09/10/2015   Edema 09/10/2015   Knee pain 09/10/2015  Groin pain 09/10/2015   Vitamin D deficiency 09/10/2015   Obesity, morbid (HCC) 09/10/2015   Anemia, iron deficiency 09/25/2009   Pain in joint, multiple sites 09/22/2009   Essential (primary) hypertension 05/31/2009   Lipoma of lower extremity 05/31/2009   Palpitations 05/31/2009   GERD (gastroesophageal reflux disease) 05/31/2009   Obstructive sleep apnea  12/29/1998   PCP:  Pcp, No Pharmacy:   [id number] - [place], [place] - [address] AT [facility] [address] [place] [address] Phone: [phone] Fax: [phone]     Social Determinants of Health (SDOH) Social History: SDOH Screenings   Food Insecurity: No Food Insecurity (11/04/2022)  Housing: Low Risk  (11/04/2022)  Transportation Needs: No Transportation Needs (11/04/2022)  Alcohol Screen: Low Risk  (09/13/2018)  Depression (PHQ2-9): Low Risk  (11/04/2022)  Physical Activity: Inactive (11/04/2022)  Tobacco Use: Medium Risk (04/21/2023)   SDOH Interventions:     Readmission Risk Interventions     No data to display

## 2023-04-27 NOTE — Progress Notes (Signed)
PROGRESS NOTE  Christopher Herman    DOB: September 02, 1976, 47 y.o.  ZOX:[id number]    Code Status: Full Code   DOA: 04/21/2023   LOS: 6   Brief hospital course  Christopher Herman is a 47 y.o. male with a PMH significant for severe obesity s/p bariatric surgery, OSA, hypoventilation syndrome, HTN, GERD, cholelithiasis, bilateral extremity lipoma, chronic lymphedema status post IVC filter placement, IDA.  They presented from home to the ED on 04/21/2023 with generalized weakness, extreme fatigue, unintended weight loss, disuse of upper extremities and poor p.o. intake  x a few days. He is chronically bed bound and malnourished.   In the ED, it was found that they had BP 100/73  Pulse (!) 110  Temp 98.2 F (36.8 C) (Oral)  Resp 15  Ht 5\' 5"  (1.651 m)  Wt (!) 185.4 kg  SpO2 100%  BMI 68.02 kg/m .  Significant findings included WBC 27.5, hgb 9.5, platelet 249, Cr 0.74, Na+ 130, K+ 3.2. SARS-CoV-2 PCR negative, Influenza PCR> negative. Blood and urine cultures pending.  Chest Xray> no active cardiopulmonary process   They were initially treated with vancomycin, cefepime, metronidazole.  They were admitted to the ICU for septic shock with hypotension resistant to bolus and required vasopressors.   4/26: consulted nephrology for concerns of worsening renal function and electrolyte abnormalities. Restarted IVF for renal support and hypotension.   4/27:renal function continues to worsen. Vanc trough elevated so not receiving any more Abx currently.  4/28:again Cr continues to rise despite continued fluids and addition of albuterol. Vanc trough remains remarkably high. Consulted CCM as fluids need to be discontinued and having hypotension/tachycardia while on midodrine. May need to resume pressors. Still shock picture.   04/27/23 - hgb decreasing and remains hypotensive despite albumin and midodrine. He is fatigued. No bleeding but likely becoming more anemic due to worsening kidney function. Spoke with  patient and his mother on the phone this morning and we are going to transfuse a unit of RBC today for hemodynamic support. Cr continues to rise.   Assessment & Plan  Principal Problem:   Severe hypothyroidism Active Problems:   Malnutrition of moderate degree (HCC)   Generalized weakness   Ulcer of right ankle (HCC)   Hypotension due to hypovolemia   Acute renal failure with tubular necrosis (HCC)   Hypothyroidism  SEPTIC shock SOURCE-UTI- has been off vasopressors since transfer out of ICU and maintaining MAP ~60-65 but less than 60 at times and symptomatic even at rest.  MRSE in 2/2 BxCx. Sensitive to vancomycin. Trough has been >60 for 3 days without additional doses- now 53 Repeat blood cultures from 4/25 NGTD.  - continue midodrine 10mg  TID - IV albumin per nephrology  - continuous tele - continue Abx- ID on board, appreciate your recs  - CTX discontinued.   - vancomycin being held for highly elevated trough. related to poor renal function.  - monitor CBC- hgb decreased today. Transfusing 1u pRBCs - consider palliative discussions   Severe Hypothyroidism- Last TSH, Free T4 check was 11 years ago with normal TSH Presents w/ generalized weakness, hyponatremia, hypothermia, hypotension with vasopressor-refractory shock TSH: 9.134 Free T4:1.34 Am cortisol 4/23 WNL -s/p IV Synthroid 250 mcg followed by 100 mcg daily. Repeat TSH on 4/25 is 1.393 - hydrocortisone 100 mg IV every 12hr- weaning slowly  Acute renal failure- Cr 1.06>1.27>1.3>1.4 today, up from baseline of about 0.6 Hyponatremia- 130>126>128>129.  hypokalemia- K+ 3.3>3.2>4.0>4.8>4.5 s/p repletion with IVF.  Renal US neg  for hydronephrosis or obstruction. Foley removed 4/26. >1 liter UOP recorded yesterday. - stopped IV fluids - consulted nephrology, appreciate your recs -Follow BMP -treat hypothyroidism as above - strict I/O - consider temp cath if not improving  Thrombocytosis  Significant leukocytosis and  rising despite supportive care and antibiotics. WBC 27.5>32.8>44.4>32.4>31.3. diff showing abs neutrophil predominance. Platelets [phone]. Morphology unremarkable on pathology report. - serial CBC - heme/onc consult  - further labs pending  Anemia of chronic disease- worsening and now symptomatic.  - transfuse 1u pRBCs   Obesity  malnutrition s/p bariatric surgery  chronic diarrhea - RD consult, appreciate your recs - start creon - imodium scheduled. Was not tolerating rectal tube. Having slightly less BMs per day - lactose-free diet - continue supplements - PT/OT - copper supplements - speech re-evaluation for described difficulty swallowing  Sinus tachycardia  prolonged Qtc- 501. HR 110-120s and ECG showing sinus. Asymptomatic - avoid Qtc prolonging agents  Bed-bound - PT/OT evaluation and therapy - frequent turning   Body mass index is 62.84 kg/m.  VTE ppx: heparin injection 5,000 Units Start: 04/27/23 2200lovenox  Diet:     Diet   Diet regular Room service appropriate? Yes with Assist; Fluid consistency: Thin   Consultants: ID Nephrology  Heme/onc CCM  Subjective 04/27/23    Pt reports feeling very tired from being turned in bed. Has many questions about nutrition.    Objective   Vitals:   04/27/23 0130 04/27/23 0200 04/27/23 0230 04/27/23 0300  BP: 94/72 99/70 99/69  94/62  Pulse: 77 93 94 94  Resp: 14 14 11 11   Temp:  97.7 F (36.5 C)    TempSrc:  Axillary    SpO2: 100% 100% 100% 100%  Weight:      Height:        Intake/Output Summary (Last 24 hours) at 04/27/2023 0756 Last data filed at 04/27/2023 0304 Gross per 24 hour  Intake 364.57 ml  Output 1050 ml  Net -685.43 ml    Filed Weights   04/22/23 0500 04/23/23 0600 04/26/23 1336  Weight: (!) 176 kg (!) 180.3 kg (!) 171.3 kg    Physical Exam:  General: awake, alert, NAD. Appears tired HEENT: atraumatic, clear conjunctiva, anicteric sclera, MMM, hearing grossly  normal Respiratory: normal respiratory effort. CTAB Cardiovascular: quick capillary refill, normal S1/S2, RRR, no JVD, murmurs Gastrointestinal: obese, soft, NT, ND Nervous: A&O x3. no gross focal neurologic deficits, low volume speech Extremities: no edema, low muscle tone. LE with chronic edema changes Skin: dry, intact, normal temperature, normal color. No rashes, lesions or ulcers on exposed skin Psychiatry: normal mood, congruent affect  Labs   I have personally reviewed the following labs and imaging studies CBC    Component Value Date/Time   WBC 27.1 (H) 04/27/2023 0623   RBC 2.61 (L) 04/27/2023 0623   HGB 7.2 (L) 04/27/2023 0623   HGB 12.8 (L) 02/11/2012 1227   HCT 22.2 (L) 04/27/2023 0623   HCT 39.3 (L) 02/11/2012 1227   PLT 440 (H) 04/27/2023 0623   PLT 333 02/11/2012 1227   MCV 85.1 04/27/2023 0623   MCV 83 02/11/2012 1227   MCH 27.6 04/27/2023 0623   MCHC 32.4 04/27/2023 0623   RDW 20.6 (H) 04/27/2023 0623   RDW 16.0 (H) 02/11/2012 1227   LYMPHSABS 3.6 04/27/2023 0623   LYMPHSABS 2.2 02/11/2012 1227   MONOABS 1.4 (H) 04/27/2023 0623   MONOABS 0.5 02/11/2012 1227   EOSABS 0.2 04/27/2023 0623   EOSABS 0.1 02/11/2012 1227  BASOSABS 0.0 04/27/2023 0623   BASOSABS 0.0 02/11/2012 1227      Latest Ref Rng & Units 04/26/2023    4:55 AM 04/26/2023    1:40 AM 04/25/2023    5:40 AM  BMP  Glucose 70 - 99 mg/dL 94  91  161   BUN 6 - 20 mg/dL 37  37  37   Creatinine 0.61 - 1.24 mg/dL 0.96  0.45  4.09   Sodium 135 - 145 mmol/L 128  131  128   Potassium 3.5 - 5.1 mmol/L 4.8  4.6  4.0   Chloride 98 - 111 mmol/L 103  103  101   CO2 22 - 32 mmol/L 15  16  16    Calcium 8.9 - 10.3 mg/dL 7.8  8.0  7.7    Disposition Plan & Communication  Patient status: Inpatient  Admitted From: Home Planned disposition location: TBD Anticipated discharge date: TBD pending clinical improvement   Family Communication: mom on speaker phone during encounter   Author: Leeroy Bock,  DO [facility] 04/27/2023, 7:56 AM   Available by Epic secure chat 7AM-7PM. If 7PM-7AM, please contact night-coverage.  [facility] contact information found on [email or web address].

## 2023-04-27 NOTE — Progress Notes (Addendum)
Pharmacy Antibiotic Note  Christopher Herman is a 47 y.o. male admitted on 04/21/2023 with sepsis.  Pharmacy has been consulted for Vancomycin and piperacillin/tazobactam dosing. Patient presents with fatigue, decreased appetite and weight loss.    Today, 04/27/2023 Day #6 antibiotics - currently on vancomycin  Renal - SCr 1.4 mg/dl - slowly trending up - suspect has decreased muscle mass thus low creatinine  WBC 27.1 - chronic leukocytosis since Feb 2023 Afebrile BMI ~65 4/23 blood cultures: 3/4 bottles LAC - oxacillin-susc S capitis R arm - oxacillin-resistant S capitis, oxacillin-resitant S auricularis.  4/25 blood cultures: 1/3 bottles S auricularis (susc pending) ID consulted 4/24 Received vancomycin IV 4/23 0254 1000mg  4/23 0444 1500mg   Vancomycin levels: Dose - vancomycin 1750mg  IV q12h (dose prior to level given 4/25 at 22:12) Vancomycin trough (prior to 4th maintenance dose) 4/26 at 11:10 = > 60 mcg/mL Vancomycin 4/26 12p dose only infused from (or 365mg  administered) Random vancomycin level 4/27 at 0540 > 60 mcg/mL Random vancomycin elvel 4/28 at 0642 > 60 mcg/mL Random vancomycin level 4/29 at 0623 = 53 mcg/mL  Plan: Vancomycin level remains supratherapeutic today at 53 mcg/ml with slow elimination - much slower than expected.  One explanation is patient has low muscle mass thus low creatinine and low predictability of SCr to accurately measure renal function  Blood cultures looks suspicious for contaminants,  no plans for further vancomycin but will follow ID recommendations.   Monitor renal function Recheck random vancomycin level 5/1 ID consulted   Height: 5\' 5"  (165.1 cm) Weight: (!) 171.3 kg (377 lb 10.4 oz) IBW/kg (Calculated) : 61.5  Temp (24hrs), Avg:97.9 F (36.6 C), Min:97.4 F (36.3 C), Max:98.7 F (37.1 C)  Recent Labs  Lab 04/21/23 0201 04/21/23 0426 04/21/23 0429 04/21/23 1505 04/22/23 0556 04/23/23 0535 04/24/23 0430 04/24/23 1110  04/25/23 0540 04/25/23 0540 04/26/23 0140 04/26/23 0455 04/26/23 0642 04/27/23 0623  WBC 24.6*   < >  --   --    < > 44.4* 32.4*  --  33.2*  --  29.1* 31.3*  --  27.1*  CREATININE 0.82   < >  --  0.75   < > 0.89 1.06  --  1.27*  --  1.36* 1.33*  --   --   LATICACIDVEN 2.1*  --  2.3* 1.1  --   --   --   --   --   --   --   --   --   --   VANCOTROUGH  --   --   --   --   --   --   --  >60*  --   --   --   --   --   --   VANCORANDOM  --   --   --   --   --   --   --   --  >60*   < >  --   --  >60* 53*   < > = values in this interval not displayed.     Estimated Creatinine Clearance: 104.6 mL/min (A) (by C-G formula based on SCr of 1.33 mg/dL (H)).    No Known Allergies  Antimicrobials this admission: Vanc 4/23 >> 4/23, 4/24 >> Ceftriaxone 4/24 >> Linezolid 4/23 >>4/23 Zosyn 4/23 >>4/24   Thank you for allowing pharmacy to be a part of this patient's care.  Juliette Alcide, PharmD, BCPS, BCIDP Work Cell: [phone] 04/27/2023 9:57 AM

## 2023-04-28 DIAGNOSIS — E039 Hypothyroidism, unspecified: Secondary | ICD-10-CM | POA: Diagnosis not present

## 2023-04-28 DIAGNOSIS — Z7189 Other specified counseling: Secondary | ICD-10-CM | POA: Diagnosis not present

## 2023-04-28 DIAGNOSIS — A419 Sepsis, unspecified organism: Secondary | ICD-10-CM | POA: Diagnosis not present

## 2023-04-28 DIAGNOSIS — R531 Weakness: Secondary | ICD-10-CM | POA: Diagnosis not present

## 2023-04-28 DIAGNOSIS — Z515 Encounter for palliative care: Secondary | ICD-10-CM

## 2023-04-28 DIAGNOSIS — L97319 Non-pressure chronic ulcer of right ankle with unspecified severity: Secondary | ICD-10-CM | POA: Diagnosis not present

## 2023-04-28 LAB — CBC WITH DIFFERENTIAL/PLATELET
Abs Immature Granulocytes: 0.89 10*3/uL — ABNORMAL HIGH (ref 0.00–0.07)
Basophils Absolute: 0.1 10*3/uL (ref 0.0–0.1)
Basophils Relative: 0 %
Eosinophils Absolute: 0.2 10*3/uL (ref 0.0–0.5)
Eosinophils Relative: 1 %
HCT: 28.4 % — ABNORMAL LOW (ref 39.0–52.0)
Hemoglobin: 9.1 g/dL — ABNORMAL LOW (ref 13.0–17.0)
Immature Granulocytes: 3 %
Lymphocytes Relative: 10 %
Lymphs Abs: 3 10*3/uL (ref 0.7–4.0)
MCH: 26.9 pg (ref 26.0–34.0)
MCHC: 32 g/dL (ref 30.0–36.0)
MCV: 84 fL (ref 80.0–100.0)
Monocytes Absolute: 1.6 10*3/uL — ABNORMAL HIGH (ref 0.1–1.0)
Monocytes Relative: 5 %
Neutro Abs: 24.2 10*3/uL — ABNORMAL HIGH (ref 1.7–7.7)
Neutrophils Relative %: 81 %
Platelets: 501 10*3/uL — ABNORMAL HIGH (ref 150–400)
RBC: 3.38 MIL/uL — ABNORMAL LOW (ref 4.22–5.81)
RDW: 20.5 % — ABNORMAL HIGH (ref 11.5–15.5)
WBC: 30 10*3/uL — ABNORMAL HIGH (ref 4.0–10.5)
nRBC: 0 % (ref 0.0–0.2)

## 2023-04-28 LAB — RENAL FUNCTION PANEL
Albumin: 2.6 g/dL — ABNORMAL LOW (ref 3.5–5.0)
Anion gap: 11 (ref 5–15)
BUN: 40 mg/dL — ABNORMAL HIGH (ref 6–20)
CO2: 15 mmol/L — ABNORMAL LOW (ref 22–32)
Calcium: 8.2 mg/dL — ABNORMAL LOW (ref 8.9–10.3)
Chloride: 103 mmol/L (ref 98–111)
Creatinine, Ser: 1.49 mg/dL — ABNORMAL HIGH (ref 0.61–1.24)
GFR, Estimated: 59 mL/min — ABNORMAL LOW (ref >60)
Glucose, Bld: 99 mg/dL (ref 70–99)
Phosphorus: 3.1 mg/dL (ref 2.5–4.6)
Potassium: 4.4 mmol/L (ref 3.5–5.1)
Sodium: 129 mmol/L — ABNORMAL LOW (ref 135–145)

## 2023-04-28 LAB — PHOSPHORUS: Phosphorus: 3 mg/dL (ref 2.5–4.6)

## 2023-04-28 LAB — TYPE AND SCREEN
ABO/RH(D): O POS
Antibody Screen: NEGATIVE
Unit division: 0

## 2023-04-28 LAB — GLUCOSE, CAPILLARY
Glucose-Capillary: 101 mg/dL — ABNORMAL HIGH (ref 70–99)
Glucose-Capillary: 108 mg/dL — ABNORMAL HIGH (ref 70–99)
Glucose-Capillary: 117 mg/dL — ABNORMAL HIGH (ref 70–99)

## 2023-04-28 LAB — BPAM RBC: ISSUE DATE / TIME: 202404291309

## 2023-04-28 LAB — CULTURE, BLOOD (ROUTINE X 2)

## 2023-04-28 MED ORDER — ALBUMIN HUMAN 25 % IV SOLN
12.5000 g | Freq: Every day | INTRAVENOUS | Status: DC
  Administered 2023-04-28 – 2023-05-05 (×8): 12.5 g via INTRAVENOUS
  Filled 2023-04-28 (×7): qty 50

## 2023-04-28 NOTE — Progress Notes (Signed)
PROGRESS NOTE  Christopher Herman    DOB: 01/23/1976, 47 y.o.  AOZ:[id number]    Code Status: Full Code   DOA: 04/21/2023   LOS: 7   Brief hospital course  Christopher Herman is a 47 y.o. male with a PMH significant for severe obesity s/p bariatric surgery, OSA, hypoventilation syndrome, HTN, GERD, cholelithiasis, bilateral extremity lipoma, chronic lymphedema status post IVC filter placement, IDA.  They presented from home to the ED on 04/21/2023 with generalized weakness, extreme fatigue, unintended weight loss, disuse of upper extremities and poor p.o. intake  x a few days. He is chronically bed bound and malnourished.   In the ED, it was found that they had BP 100/73  Pulse (!) 110  Temp 98.2 F (36.8 C) (Oral)  Resp 15  Ht 5\' 5"  (1.651 m)  Wt (!) 185.4 kg  SpO2 100%  BMI 68.02 kg/m .  Significant findings included WBC 27.5, hgb 9.5, platelet 249, Cr 0.74, Na+ 130, K+ 3.2. SARS-CoV-2 PCR negative, Influenza PCR> negative. Blood and urine cultures pending.  Chest Xray> no active cardiopulmonary process   They were initially treated with vancomycin, cefepime, metronidazole.  They were admitted to the ICU for septic shock with hypotension resistant to bolus and required vasopressors.   4/26: consulted nephrology for concerns of worsening renal function and electrolyte abnormalities. Restarted IVF for renal support and hypotension.   4/27:renal function continues to worsen. Vanc trough elevated so not receiving any more Abx currently.  4/28:again Cr continues to rise despite continued fluids and addition of albuterol. Vanc trough remains remarkably high. Consulted CCM as fluids need to be discontinued and having hypotension/tachycardia while on midodrine. May need to resume pressors. Still shock picture.  4/29:hgb decreasing and remains hypotensive despite albumin and midodrine. He is fatigued. No bleeding but likely becoming more anemic due to worsening kidney function. Spoke with patient  and his mother on the phone this morning and we are going to transfuse a unit of RBC today for hemodynamic support. Cr continues to rise.   04/28/23 - Despite blood transfusion and good response yesterday, not having improved fatigue. Blood pressures remain soft with mild improvement. MAP ~70. Seen by palliative with no change in management.   Assessment & Plan  Principal Problem:   Severe hypothyroidism Active Problems:   Malnutrition of moderate degree (HCC)   Generalized weakness   Ulcer of right ankle (HCC)   Hypotension due to hypovolemia   Acute renal failure with tubular necrosis (HCC)   Hypothyroidism  SEPTIC shock SOURCE-UTI- has been off vasopressors since transfer out of ICU and maintaining MAP ~70 after blood transfusion. MRSE in 2/2 BxCx. Sensitive to vancomycin. Trough has been >60 for 3 days without additional doses- now 53 Repeat blood cultures from 4/25 NGTD.  - continue midodrine 10mg  TID - IV albumin per nephrology  - continuous tele - continue Abx- ID on board, appreciate your recs  - CTX discontinued.   - vancomycin being held for highly elevated trough. related to poor renal function.  - monitor CBC - consider further palliative discussions   Severe Hypothyroidism- Last TSH, Free T4 check was 11 years ago with normal TSH Presents w/ generalized weakness, hyponatremia, hypothermia, hypotension with vasopressor-refractory shock TSH: 9.134 Free T4:1.34 Am cortisol 4/23 WNL -s/p IV Synthroid 250 mcg followed by 100 mcg daily. Repeat TSH on 4/25 is 1.393 - hydrocortisone 100 mg IV every 12hr- weaning slowly  Acute renal failure- Cr 1.06>1.27>1.3>1.4>1.49 today, up from baseline of about  0.6 Hyponatremia- 130>126>128>>129.  hypokalemia- K+ 3.3>>4.5>4.4 s/p repletion with IVF.  Renal US neg for hydronephrosis or obstruction. Foley removed 4/26. >400cc UOP recorded yesterday. - stopped IV fluids - consulted nephrology, appreciate your recs  - albumin  administration to help improve third-spacing -Follow BMP -treat hypothyroidism as above - strict I/O - consider temp cath if not improving  Thrombocytosis  Significant leukocytosis and rising despite supportive care and antibiotics. WBC 27.5>32.8>44.4>32.4>31.3. diff showing abs neutrophil predominance. Platelets [phone]. Morphology unremarkable on pathology report. - serial CBC - heme/onc consult  - further labs pending  Anemia of chronic disease-transfused 1u pRBCs 4/29. Hgb 7.2>8.7>9.1   Obesity  malnutrition s/p bariatric surgery  chronic diarrhea - RD consult, appreciate your recs - started creon - imodium scheduled. Was not tolerating rectal tube. Having slightly less BMs per day - lactose-free diet - continue supplements - PT/OT - copper supplements - speech re-evaluation for described difficulty swallowing  Sinus tachycardia  prolonged Qtc- 501. HR 110s and ECG showing sinus. Asymptomatic - avoid Qtc prolonging agents  Bed-bound - PT/OT evaluation and therapy - frequent turning   Body mass index is 63.17 kg/m.  VTE ppx: heparin injection 5,000 Units Start: 04/27/23 2200 from lovenox due to worsening renal function  Diet:     Diet   Diet regular Room service appropriate? Yes with Assist; Fluid consistency: Thin   Consultants: ID Nephrology  Heme/onc CCM  Subjective 04/28/23    Pt reports feeling very tired. Denies improvement from the transfusion. He has not eaten yet today. States that he gets full from the shakes. He is unable to scratch his neck so asks me to do it for him- which is significant since he was typing for work prior to admission. States that his legs are not weeping today.   Objective   Vitals:   04/27/23 1800 04/27/23 1900 04/28/23 0300 04/28/23 0321  BP: 94/76 93/77 97/80    Pulse: 93 (!) 101 100   Resp: 12 13 11    Temp:   97.6 F (36.4 C)   TempSrc:   Axillary   SpO2: 100% 100% 100%   Weight:    (!) 172.2 kg  Height:         Intake/Output Summary (Last 24 hours) at 04/28/2023 0757 Last data filed at 04/27/2023 1910 Gross per 24 hour  Intake 808 ml  Output 400 ml  Net 408 ml    Filed Weights   04/23/23 0600 04/26/23 1336 04/28/23 0321  Weight: (!) 180.3 kg (!) 171.3 kg (!) 172.2 kg    Physical Exam:  General: awake, alert, NAD. Appears tired HEENT: atraumatic, clear conjunctiva, anicteric sclera, MMM, hearing grossly normal Respiratory: normal respiratory effort. CTAB Cardiovascular: quick capillary refill, normal S1/S2, RRR, no JVD, murmurs Gastrointestinal: obese, soft, NT, ND Nervous: A&O x3. no gross focal neurologic deficits, low volume speech Extremities: obese LE. low muscle tone. LE with chronic edema changes Skin: dry, intact, normal temperature, normal color. No rashes, lesions or ulcers on exposed skin Psychiatry: normal mood, congruent affect  Labs   I have personally reviewed the following labs and imaging studies CBC    Component Value Date/Time   WBC 25.3 (H) 04/27/2023 1831   RBC 3.19 (L) 04/27/2023 1831   HGB 8.7 (L) 04/27/2023 1831   HGB 12.8 (L) 02/11/2012 1227   HCT 27.1 (L) 04/27/2023 1831   HCT 39.3 (L) 02/11/2012 1227   PLT 369 04/27/2023 1831   PLT 333 02/11/2012 1227   MCV 85.0 04/27/2023 1831  MCV 83 02/11/2012 1227   MCH 27.3 04/27/2023 1831   MCHC 32.1 04/27/2023 1831   RDW 20.0 (H) 04/27/2023 1831   RDW 16.0 (H) 02/11/2012 1227   LYMPHSABS 3.6 04/27/2023 0623   LYMPHSABS 2.2 02/11/2012 1227   MONOABS 1.4 (H) 04/27/2023 0623   MONOABS 0.5 02/11/2012 1227   EOSABS 0.2 04/27/2023 0623   EOSABS 0.1 02/11/2012 1227   BASOSABS 0.0 04/27/2023 0623   BASOSABS 0.0 02/11/2012 1227      Latest Ref Rng & Units 04/27/2023    9:07 AM 04/26/2023    4:55 AM 04/26/2023    1:40 AM  BMP  Glucose 70 - 99 mg/dL 95  94  91   BUN 6 - 20 mg/dL 37  37  37   Creatinine 0.61 - 1.24 mg/dL 1.61  0.96  0.45   Sodium 135 - 145 mmol/L 129  128  131   Potassium 3.5 - 5.1 mmol/L  4.5  4.8  4.6   Chloride 98 - 111 mmol/L 102  103  103   CO2 22 - 32 mmol/L 16  15  16    Calcium 8.9 - 10.3 mg/dL 8.0  7.8  8.0    Disposition Plan & Communication  Patient status: Inpatient  Admitted From: Home Planned disposition location: TBD Anticipated discharge date: TBD pending clinical improvement   Family Communication: mom on phone   Author: Leeroy Bock, DO [facility] 04/28/2023, 7:57 AM   Available by Epic secure chat 7AM-7PM. If 7PM-7AM, please contact night-coverage.  [facility] contact information found on [email or web address].

## 2023-04-28 NOTE — Evaluation (Signed)
Occupational Therapy Evaluation Patient Details Name: Christopher Herman MRN: [id number] DOB: [birth year] Today's Date: 04/28/2023   History of Present Illness 47 y/o male presented to ED on 04/21/23 for weakness, extreme fatigue, unintended weight loss, disuse of UE and poor P.O. intake. Admitted for sepsis 2/2 unknown etiology and severe hypothyroidism. PMH: severe obesity s/p bariatric surgery, OSA, hypoventilation syndrome, HTN, cholelithiasis, bilateral extremity lipoma, chronic lymphedema s/p IVC filter placement, IDA   Clinical Impression   Mr. Loya was seen for OT re-evaluation after recent transition to ICU. Pt presents at or near functional baseline, but continues to be motivated to improve his BUE strength and ROM to maximize his independence with self-feeding and access to his preferred leisure occupations including crochet, knitting, and computer use. He is able to bring BUE up to his head without assistance today, but remains generally weak with LUE marginally weaker than RUE. Pt instructed in BUE exercises as described below. GOC remain appropriate. Continue to anticipate the need for follow up OT services upon hospital DC.      Recommendations for follow up therapy are one component of a multi-disciplinary discharge planning process, led by the attending physician.  Recommendations may be updated based on patient status, additional functional criteria and insurance authorization.   Assistance Recommended at Discharge Frequent or constant Supervision/Assistance  Patient can return home with the following Two people to help with bathing/dressing/bathroom;Two people to help with walking and/or transfers;Assistance with cooking/housework;Assist for transportation;Help with stairs or ramp for entrance    Functional Status Assessment  Patient has had a recent decline in their functional status and/or demonstrates limited ability to make significant improvements in function in a reasonable  and predictable amount of time  Equipment Recommendations  None recommended by OT    Recommendations for Other Services       Precautions / Restrictions Precautions Precautions: Fall Restrictions Weight Bearing Restrictions: No      Mobility Bed Mobility               General bed mobility comments: Unsafe/unable to perform.    Transfers                   General transfer comment: NT unsafe/unable to perform.      Balance                                           ADL either performed or assessed with clinical judgement   ADL Overall ADL's : Needs assistance/impaired                                       General ADL Comments: Patient continues to be dependent for all ADLs at bed level. Unable to mobilize in bed without +3-4 assistance per nursing staff.  He is able to bring BUE up to head/face for self-feeding and drinking but fatigues quickly. States arms "feel cold and numb", regularly requests blankets be pulled over arms/hands t/o session.     Vision Patient Visual Report: No change from baseline       Perception     Praxis      Pertinent Vitals/Pain Pain Assessment Pain Assessment: No/denies pain     Hand Dominance Right   Extremity/Trunk Assessment Upper Extremity Assessment Upper Extremity Assessment: Generalized weakness (BUE generally  weak, grossly 2/5 t/o, pt is able to bring BUE up to head with LUE ROM marginally decreased as compared to RUE. Shoulder flexion minimal, but has good bi/tricep activation for ROM this date.)   Lower Extremity Assessment Lower Extremity Assessment: Generalized weakness       Communication     Cognition Arousal/Alertness: Awake/alert Behavior During Therapy: Flat affect, WFL for tasks assessed/performed Overall Cognitive Status: Within Functional Limits for tasks assessed                                 General Comments: Quiet, but opens up with  encouragement and discussion of preferred hobbies. Pt motivated to getting back to crochet and knitting. States he would also be interested in joint protection strategies for using his computer.     General Comments       Exercises Hand Exercises Wrist Extension: AROM, Both, 10 reps, Supine Digit Composite Flexion: AROM, Both, 10 reps, Supine Composite Extension: AROM, Supine, Both, 10 reps Other Exercises Other Exercises: Pt educated on role of OT in acute setting and BUE excercises listed below.   Shoulder Instructions      Home Living Family/patient expects to be discharged to:: Private residence Living Arrangements: Parent Available Help at Discharge: Family Type of Home: House Home Access: Stairs to enter Secretary/administrator of Steps: 6   Home Layout: One level     Bathroom Shower/Tub: Sponge bathes at baseline     Bathroom Accessibility: No   Home Equipment: Hospital bed   Additional Comments: Pt states his mom and her husband help him at home      Prior Functioning/Environment Prior Level of Function : Needs assist             Mobility Comments: Pt states he has been bedbound for ~3 years. Last attempt at ambulation was mid 2022 per therapy notes. ADLs Comments: Pt states that his mom and her husband have been assisting with all ADLs at bed level, including feeding. Pt states his R arm used to be able to self-feed until about a month ago and then it got weaker; L arm has been non-functional for approx 1 year, per pt.        OT Problem List: Decreased range of motion;Decreased strength;Decreased activity tolerance;Impaired UE functional use;Obesity      OT Treatment/Interventions: Self-care/ADL training;Therapeutic exercise;Therapeutic activities;Patient/family education;DME and/or AE instruction    OT Goals(Current goals can be found in the care plan section) Acute Rehab OT Goals Patient Stated Goal: To be able to crochet again. OT Goal Formulation:  With patient Time For Goal Achievement: 05/12/23 Potential to Achieve Goals: Fair  OT Frequency: Min 1X/week    Co-evaluation              AM-PAC OT "6 Clicks" Daily Activity     Outcome Measure Help from another person eating meals?: Total Help from another person taking care of personal grooming?: Total Help from another person toileting, which includes using toliet, bedpan, or urinal?: Total Help from another person bathing (including washing, rinsing, drying)?: Total Help from another person to put on and taking off regular upper body clothing?: Total Help from another person to put on and taking off regular lower body clothing?: Total 6 Click Score: 6   End of Session    Activity Tolerance: Patient limited by fatigue Patient left: with call bell/phone within reach;with bed alarm set;in bed  OT Visit Diagnosis:  Muscle weakness (generalized) (M62.81)                Time: 1610-9604 OT Time Calculation (min): 28 min Charges:  OT General Charges $OT Visit: 1 Visit OT Evaluation $OT Re-eval: 1 Re-eval OT Treatments $Self Care/Home Management : 8-22 mins  Rockney Ghee, M.S., OTR/L 04/28/23, 2:51 PM

## 2023-04-28 NOTE — Progress Notes (Signed)
   04/28/23 1500  Spiritual Encounters  Type of Visit Initial  Care provided to: Patient  Referral source Chaplain assessment  Reason for visit Routine spiritual support  OnCall Visit No  Spiritual Framework  Presenting Themes Community and relationships  Family Stress Factors None identified  Interventions  Spiritual Care Interventions Made Established relationship of care and support;Compassionate presence;Reflective listening;Normalization of emotions;Prayer  Intervention Outcomes  Outcomes Reduced anxiety;Awareness of support  Spiritual Care Plan  Spiritual Care Issues Still Outstanding No further spiritual care needs at this time (see row info)   Routine visit with patient they ask me to pray for them and the world. Spend a little time talking to patient about the world and God. Patient seem to be in good spirit.

## 2023-04-28 NOTE — Progress Notes (Addendum)
[facility] [place], [place] 04/28/23  Subjective:   Hospital day # 7  Patient seen and evaluated in ICU Sodium 129 today Serum creatinine elevated Patient has pure wick  Skin continues to weep Edema present Small meals due to history of gastric bypass.   Renal: 04/29 0701 - 04/30 0700 In: 808 [P.O.:360; I.V.:54; Blood:394] Out: 400 [Urine:400] Lab Results  Component Value Date   CREATININE 1.49 (H) 04/28/2023   CREATININE 1.40 (H) 04/27/2023   CREATININE 1.33 (H) 04/26/2023     Objective:  Vital signs in last 24 hours:  Temp:  [97.6 F (36.4 C)-98.4 F (36.9 C)] 98 F (36.7 C) (04/30 1200) Pulse Rate:  [85-122] 116 (04/30 1300) Resp:  [11-17] 13 (04/30 1300) BP: (87-102)/(39-81) 93/74 (04/30 1300) SpO2:  [99 %-100 %] 100 % (04/30 1300) Weight:  [172.2 kg] 172.2 kg (04/30 0321)  Weight change: 0.9 kg Filed Weights   04/23/23 0600 04/26/23 1336 04/28/23 0321  Weight: (!) 180.3 kg (!) 171.3 kg (!) 172.2 kg    Intake/Output:    Intake/Output Summary (Last 24 hours) at 04/28/2023 1410 Last data filed at 04/27/2023 1910 Gross per 24 hour  Intake 404 ml  Output 400 ml  Net 4 ml      Physical Exam: General: Super morbidly obese gentleman, laying in the bed  HEENT Moist oral mucous membranes  Pulm/lungs Normal breathing effort room air  CVS/Heart No rub  Abdomen:  Obese, nontender  Extremities: Significant dependent peripheral edema on the thighs  Neurologic: Alert, oriented  Skin: Lichenified skin over calves and lower thighs  Dialysis Access: None at present       Basic Metabolic Panel:  Recent Labs  Lab 04/22/23 0556 04/23/23 0535 04/24/23 0430 04/25/23 0540 04/26/23 0140 04/26/23 0455 04/27/23 0623 04/27/23 0907 04/28/23 0703  NA 127* 130* 126* 128* 131* 128*  --  129* 129*  K 3.3* 3.3* 3.2* 4.0 4.6 4.8  --  4.5 4.4  CL 97* 98 96* 101 103 103  --  102 103  CO2 22 20* 20* 16* 16* 15*  --  16* 15*  GLUCOSE 131* 102*  116* 114* 91 94  --  95 99  BUN 29* 31* 36* 37* 37* 37*  --  37* 40*  CREATININE 0.87 0.89 1.06 1.27* 1.36* 1.33*  --  1.40* 1.49*  CALCIUM 7.7* 7.8* 7.5* 7.7* 8.0* 7.8*  --  8.0* 8.2*  MG 2.1 2.0 2.0  --   --   --   --   --   --   PHOS 2.9 2.8 2.9 3.0  --  2.7 2.7  --  3.1  3.0      CBC: Recent Labs  Lab 04/24/23 0430 04/25/23 0540 04/26/23 0140 04/26/23 0455 04/27/23 0623 04/27/23 1831 04/28/23 0703  WBC 32.4* 33.2* 29.1* 31.3* 27.1* 25.3* 30.0*  NEUTROABS 29.1* 29.1*  --  24.9* 21.0*  --  24.2*  HGB 7.7* 8.2* 7.4* 7.5* 7.2* 8.7* 9.1*  HCT 23.6* 25.8* 23.3* 23.5* 22.2* 27.1* 28.4*  MCV 84.6 85.4 86.0 84.5 85.1 85.0 84.0  PLT 431* 514* 450* 462* 440* 369 501*      No results found for: "HEPBSAG", "HEPBSAB", "HEPBIGM"    Microbiology:  Recent Results (from the past 240 hour(s))  Blood Culture (routine x 2)     Status: Abnormal   Collection Time: 04/21/23  2:02 AM   Specimen: BLOOD  Result Value Ref Range Status   Specimen Description   Final  BLOOD RIGHT ARM Performed at [facility], [address] [place], [place] [address]    Special Requests   Final    BOTTLES DRAWN AEROBIC AND ANAEROBIC Blood Culture adequate volume Performed at [facility], [address] [place], [place] [address]    Culture  Setup Time   Final    GRAM POSITIVE COCCI IN BOTH AEROBIC AND ANAEROBIC BOTTLES CRITICAL VALUE NOTED.  VALUE IS CONSISTENT WITH PREVIOUSLY REPORTED AND CALLED VALUE. Performed at [facility], [address] [place], [place] [address]    Culture (A)  Final    STAPHYLOCOCCUS CAPITIS STAPHYLOCOCCUS AURICULARIS    Report Status 04/26/2023 FINAL  Final   Organism ID, Bacteria STAPHYLOCOCCUS CAPITIS  Final   Organism ID, Bacteria STAPHYLOCOCCUS AURICULARIS  Final      Susceptibility   Staphylococcus auricularis - MIC*    CIPROFLOXACIN 4 RESISTANT Resistant     ERYTHROMYCIN >=8 RESISTANT Resistant     GENTAMICIN <=0.5  SENSITIVE Sensitive     OXACILLIN >=4 RESISTANT Resistant     TETRACYCLINE <=1 SENSITIVE Sensitive     VANCOMYCIN <=0.5 SENSITIVE Sensitive     TRIMETH/SULFA <=10 SENSITIVE Sensitive     CLINDAMYCIN RESISTANT Resistant     RIFAMPIN <=0.5 SENSITIVE Sensitive     Inducible Clindamycin POSITIVE Resistant     * STAPHYLOCOCCUS AURICULARIS   Staphylococcus capitis - MIC*    CIPROFLOXACIN 4 RESISTANT Resistant     ERYTHROMYCIN >=8 RESISTANT Resistant     GENTAMICIN <=0.5 SENSITIVE Sensitive     OXACILLIN >=4 RESISTANT Resistant     TETRACYCLINE >=16 RESISTANT Resistant     VANCOMYCIN <=0.5 SENSITIVE Sensitive     TRIMETH/SULFA <=10 SENSITIVE Sensitive     CLINDAMYCIN INTERMEDIATE Intermediate     RIFAMPIN 1 SENSITIVE Sensitive     Inducible Clindamycin NEGATIVE Sensitive     * STAPHYLOCOCCUS CAPITIS  SARS Coronavirus 2 by RT PCR (hospital order, performed in [facility] hospital lab) *cepheid single result test* Anterior Nasal Swab     Status: None   Collection Time: 04/21/23  2:18 AM   Specimen: Anterior Nasal Swab  Result Value Ref Range Status   SARS Coronavirus 2 by RT PCR NEGATIVE NEGATIVE Final    Comment: (NOTE) SARS-CoV-2 target nucleic acids are NOT DETECTED.  The SARS-CoV-2 RNA is generally detectable in upper and lower respiratory specimens during the acute phase of infection. The lowest concentration of SARS-CoV-2 viral copies this assay can detect is 250 copies / mL. A negative result does not preclude SARS-CoV-2 infection and should not be used as the sole basis for treatment or other patient management decisions.  A negative result may occur with improper specimen collection / handling, submission of specimen other than nasopharyngeal swab, presence of viral mutation(s) within the areas targeted by this assay, and inadequate number of viral copies (<250 copies / mL). A negative result must be combined with clinical observations, patient history, and epidemiological  information.  Fact Sheet for Patients:   RoadLapTop.co.za  Fact Sheet for Healthcare Providers: http://kim-miller.com/  This test is not yet approved or  cleared by the [place] FDA and has been authorized for detection and/or diagnosis of SARS-CoV-2 by FDA under an Emergency Use Authorization (EUA).  This EUA will remain in effect (meaning this test can be used) for the duration of the COVID-19 declaration under Section 564(b)(1) of the Act, 21 U.S.C. section 360bbb-3(b)(1), unless the authorization is terminated or revoked sooner.  Performed at [facility], [address] [place], [place] [address]   Blood Culture (routine x 2)     Status: Abnormal   Collection Time: 04/21/23  2:36 AM   Specimen: BLOOD  Result Value Ref Range Status   Specimen Description   Final    BLOOD  LEFT AC Performed at [facility], [address], [place], [place] [address]    Special Requests   Final    BOTTLES DRAWN AEROBIC AND ANAEROBIC Blood Culture adequate volume Performed at [facility], [address] [place], [place] [address]    Culture  Setup Time   Final    GRAM POSITIVE COCCI AEROBIC BOTTLE ONLY CRITICAL RESULT CALLED TO, READ BACK BY AND VERIFIED WITH: JASON ROBINS AT 2213 ON 04/21/23 BY SS    Culture (A)  Final    STAPHYLOCOCCUS CAPITIS UNABLE TO RECOVER SEPI IN CULTURE Performed at [facility], [address], [place], [place] [address]    Report Status 04/26/2023 FINAL  Final   Organism ID, Bacteria STAPHYLOCOCCUS CAPITIS  Final      Susceptibility   Staphylococcus capitis - MIC*    CIPROFLOXACIN <=0.5 SENSITIVE Sensitive     ERYTHROMYCIN >=8 RESISTANT Resistant     GENTAMICIN <=0.5 SENSITIVE Sensitive     OXACILLIN <=0.25 SENSITIVE Sensitive     TETRACYCLINE >=16 RESISTANT Resistant     VANCOMYCIN <=0.5 SENSITIVE Sensitive     TRIMETH/SULFA <=10 SENSITIVE Sensitive     CLINDAMYCIN  RESISTANT Resistant     RIFAMPIN <=0.5 SENSITIVE Sensitive     Inducible Clindamycin POSITIVE Resistant     * STAPHYLOCOCCUS CAPITIS  Blood Culture ID Panel (Reflexed)     Status: Abnormal   Collection Time: 04/21/23  2:36 AM  Result Value Ref Range Status   Enterococcus faecalis NOT DETECTED NOT DETECTED Final   Enterococcus Faecium NOT DETECTED NOT DETECTED Final   Listeria monocytogenes NOT DETECTED NOT DETECTED Final   Staphylococcus species DETECTED (A) NOT DETECTED Final    Comment: CRITICAL RESULT CALLED TO, READ BACK BY AND VERIFIED WITH: JASON ROBINS AT 2213 ON 04/21/23 BY SS    Staphylococcus aureus (BCID) NOT DETECTED NOT DETECTED Final   Staphylococcus epidermidis DETECTED (A) NOT DETECTED Final    Comment: Methicillin (oxacillin) resistant coagulase negative staphylococcus. Possible blood culture contaminant (unless isolated from more than one blood culture draw or clinical case suggests pathogenicity). No antibiotic treatment is indicated for blood  culture contaminants. CRITICAL RESULT CALLED TO, READ BACK BY AND VERIFIED WITH: JASON ROBINS AT 2213 ON 04/21/23 BY SS    Staphylococcus lugdunensis NOT DETECTED NOT DETECTED Final   Streptococcus species NOT DETECTED NOT DETECTED Final   Streptococcus agalactiae NOT DETECTED NOT DETECTED Final   Streptococcus pneumoniae NOT DETECTED NOT DETECTED Final   Streptococcus pyogenes NOT DETECTED NOT DETECTED Final   A.calcoaceticus-baumannii NOT DETECTED NOT DETECTED Final   Bacteroides fragilis NOT DETECTED NOT DETECTED Final   Enterobacterales NOT DETECTED NOT DETECTED Final   Enterobacter cloacae complex NOT DETECTED NOT DETECTED Final   Escherichia coli NOT DETECTED NOT DETECTED Final   Klebsiella aerogenes NOT DETECTED NOT DETECTED Final   Klebsiella oxytoca NOT DETECTED NOT DETECTED Final   Klebsiella pneumoniae NOT DETECTED NOT DETECTED Final   Proteus species NOT DETECTED NOT DETECTED Final   Salmonella species NOT  DETECTED NOT DETECTED Final   Serratia marcescens NOT DETECTED NOT DETECTED Final   Haemophilus influenzae NOT DETECTED NOT DETECTED Final   Neisseria meningitidis NOT  DETECTED NOT DETECTED Final   Pseudomonas aeruginosa NOT DETECTED NOT DETECTED Final   Stenotrophomonas maltophilia NOT DETECTED NOT DETECTED Final   Candida albicans NOT DETECTED NOT DETECTED Final   Candida auris NOT DETECTED NOT DETECTED Final   Candida glabrata NOT DETECTED NOT DETECTED Final   Candida krusei NOT DETECTED NOT DETECTED Final   Candida parapsilosis NOT DETECTED NOT DETECTED Final   Candida tropicalis NOT DETECTED NOT DETECTED Final   Cryptococcus neoformans/gattii NOT DETECTED NOT DETECTED Final   Methicillin resistance mecA/C DETECTED (A) NOT DETECTED Final    Comment: CRITICAL RESULT CALLED TO, READ BACK BY AND VERIFIED WITH: JASON ROBINS AT 2213 ON 04/21/23 BY SS Performed at [facility], [address], [place], [place] [address]   Urine Culture (for pregnant, neutropenic or urologic patients or patients with an indwelling urinary catheter)     Status: None   Collection Time: 04/22/23  5:22 PM   Specimen: Urine, Clean Catch  Result Value Ref Range Status   Specimen Description   Final    URINE, CLEAN CATCH Performed at [facility], [address], [place], [place] [address]    Special Requests   Final    NONE Performed at [facility], [address], [place], [place] [address]    Culture   Final    NO GROWTH Performed at [facility], [address] [place], [place] [address]    Report Status 04/23/2023 FINAL  Final  Culture, blood (Routine X 2) w Reflex to ID Panel     Status: Abnormal (Preliminary result)   Collection Time: 04/23/23  6:10 AM   Specimen: BLOOD  Result Value Ref Range Status   Specimen Description   Final    BLOOD  LEFT HAND Performed at [facility], [address], [place], [place] [address]    Special Requests   Final     BOTTLES DRAWN AEROBIC ONLY Blood Culture results may not be optimal due to an inadequate volume of blood received in culture bottles Performed at [facility], [address] [place], [place] [address]    Culture  Setup Time   Final    GRAM POSITIVE COCCI AEROBIC BOTTLE ONLY CRITICAL RESULT CALLED TO, READ BACK BY AND VERIFIED WITH: DEVON MITCHELL 04/24/2023 1422 CP Performed at [facility], [address], [place], [place] [address]    Culture (A)  Final    STAPHYLOCOCCUS AURICULARIS CULTURE REINCUBATED FOR BETTER GROWTH Performed at [facility], [address], [place], [place] [address]    Report Status PENDING  Incomplete  Culture, blood (Routine X 2) w Reflex to ID Panel     Status: None   Collection Time: 04/23/23  6:21 AM   Specimen: BLOOD  Result Value Ref Range Status   Specimen Description BLOOD  RIGHT HAND  Final   Special Requests   Final    BOTTLES DRAWN AEROBIC AND ANAEROBIC Blood Culture adequate volume   Culture   Final    NO GROWTH 5 DAYS Performed at [facility], [address], [place], [place] [address]    Report Status 04/28/2023 FINAL  Final    Coagulation Studies: No results for input(s): "LABPROT", "INR" in the last 72 hours.  Urinalysis: No results for input(s): "COLORURINE", "LABSPEC", "PHURINE", "GLUCOSEU", "HGBUR", "BILIRUBINUR", "KETONESUR", "PROTEINUR", "UROBILINOGEN", "NITRITE", "LEUKOCYTESUR" in the last 72 hours.  Invalid input(s): "APPERANCEUR"    Imaging: No results found.   Medications:  albumin human Stopped (04/28/23 1302)    vitamin C  500 mg Oral BID   Chlorhexidine Gluconate Cloth  6 each Topical Daily   copper  8 mg Oral Daily   feeding supplement (KATE FARMS STANDARD 1.4)  325 mL Oral TID BM   folic acid  1 mg Oral Daily   heparin injection (subcutaneous)  5,000 Units Subcutaneous Q8H   hydrocortisone sod succinate (SOLU-CORTEF) inj  100 mg Intravenous Daily   insulin aspart   0-15 Units Subcutaneous TID WC   lactase  6,000 Units Oral TID WC   levothyroxine  100 mcg Oral Q0600   lipase/protease/amylase  12,000 Units Oral TID AC   midodrine  10 mg Oral TID WC   multivitamin  15 mL Oral Daily   pantoprazole (PROTONIX) IV  40 mg Intravenous Q24H   polyethylene glycol  17 g Oral Daily   thiamine  100 mg Oral Daily   vancomycin variable dose per unstable renal function (pharmacist dosing)   Does not apply See admin instructions   vitamin A  10,000 Units Oral Daily   Vitamin D (Ergocalciferol)  50,000 Units Oral Q7 days   vitamin E  400 Units Oral Daily   zinc sulfate  220 mg Oral Daily   acetaminophen, bismuth subsalicylate, loperamide, polyethylene glycol  Assessment/ Plan:  47 y.o. male with super morbid obesity, hypertension, obstructive sleep apnea, lymphedema, history of bariatric surgery, cholelithiasis, severe hypothyroidism, bedridden status, history of IVC filter placement admitted on 04/21/2023 for Severe hypothyroidism [E03.9] Generalized weakness [R53.1] Lower limb ulcer, ankle, right, with unspecified severity (HCC) [L97.319] Hypothyroidism, unspecified type [E03.9] Sepsis, due to unspecified organism, unspecified whether acute organ dysfunction present (HCC) [A41.9]   Acute kidney injury Likely secondary to hemodynamic alterations caused by septic shock and hypotension and possibly Vancomycin toxicity leading to ATN. Patient is nonoliguric. Creatinine continues to slowly rise. No acute indication for dialysis. Will continue to monitor renal function and urine output.   Generalized edema Patient has hypoalbuminemia and malnutrition. Completed three days of IV albumin. Will continue IV albumin 12.5g daily. Patient encouraged to increase protein intake.   Hyponatremia Likely secondary to AKI and generalized volume overload. Stable today  Hypotension, received pressor support in ICU.  Continue midodrine.  Chronic systolic CHF LVEF 35 to 40%,  moderately decreased function, global hypokinesis, grade 1 diastolic dysfunction, normal right ventricular systolic function from echo 04/22/2023.    LOS: 7 Wendee Beavers 4/30/20242:10 PM  [facility] [place], [place] [phone]  Patient was seen and examined with Wendee Beavers, NP.  I personally formulated plan of care for the problems addressed and discussed with NP.  I agree with the note as documented except as noted below. I take the responsibility for the inherent risk of patient management.

## 2023-04-28 NOTE — Progress Notes (Signed)
Daily Progress Note   Patient Name: Christopher Herman       Date: 04/28/2023 DOB: Dec 21, 1976  Age: 47 y.o. MRN#: [id number] Attending Physician: Leeroy Bock, MD Primary Care Physician: Pcp, No Admit Date: 04/21/2023  Reason for Consultation/Follow-up: Establishing goals of care  Subjective: Notes and labs reviewed.  In to see patient.  Patient has food at the bedside, that does not appear to be touched.  He does have nutritional shakes at bedside which he has drank some of.  He request for the RD to speak with his mother regarding education on nutrition moving forward.  Patient requests staff to scratch his face, as he states he is unable to do so.  Length of Stay: 7  Current Medications: Scheduled Meds:   vitamin C  500 mg Oral BID   Chlorhexidine Gluconate Cloth  6 each Topical Daily   copper  8 mg Oral Daily   feeding supplement (KATE FARMS STANDARD 1.4)  325 mL Oral TID BM   folic acid  1 mg Oral Daily   heparin injection (subcutaneous)  5,000 Units Subcutaneous Q8H   hydrocortisone sod succinate (SOLU-CORTEF) inj  100 mg Intravenous Daily   insulin aspart  0-15 Units Subcutaneous TID WC   lactase  6,000 Units Oral TID WC   levothyroxine  100 mcg Oral Q0600   lipase/protease/amylase  12,000 Units Oral TID AC   midodrine  10 mg Oral TID WC   multivitamin  15 mL Oral Daily   pantoprazole (PROTONIX) IV  40 mg Intravenous Q24H   polyethylene glycol  17 g Oral Daily   thiamine  100 mg Oral Daily   vancomycin variable dose per unstable renal function (pharmacist dosing)   Does not apply See admin instructions   vitamin A  10,000 Units Oral Daily   Vitamin D (Ergocalciferol)  50,000 Units Oral Q7 days   vitamin E  400 Units Oral Daily   zinc sulfate  220 mg Oral Daily     Continuous Infusions:  albumin human 12.5 g (04/28/23 1241)    PRN Meds: acetaminophen, bismuth subsalicylate, loperamide, polyethylene glycol  Physical Exam Pulmonary:     Effort: Pulmonary effort is normal.  Neurological:     Mental Status: He is alert.  Vital Signs: BP 90/75 (BP Location: Right Arm)   Pulse (!) 119   Temp 98 F (36.7 C) (Oral)   Resp 17   Ht 5\' 5"  (1.651 m)   Wt (!) 172.2 kg   SpO2 100%   BMI 63.17 kg/m  SpO2: SpO2: 100 % O2 Device: O2 Device: Room Air O2 Flow Rate:    Intake/output summary:  Intake/Output Summary (Last 24 hours) at 04/28/2023 1253 Last data filed at 04/27/2023 1910 Gross per 24 hour  Intake 448 ml  Output 400 ml  Net 48 ml   LBM: Last BM Date : 04/27/23 Baseline Weight: Weight: (!) 180.1 kg Most recent weight: Weight: (!) 172.2 kg   Patient Active Problem List   Diagnosis Date Noted   Acute renal failure with tubular necrosis (HCC) 04/26/2023   Hypothyroidism 04/26/2023   Hypotension due to hypovolemia 04/24/2023   Malnutrition of moderate degree (HCC) 04/23/2023   Generalized weakness 04/23/2023   Ulcer of right ankle (HCC) 04/23/2023   Severe hypothyroidism 04/21/2023   Chronic diarrhea of unknown origin 07/11/2022   Muscle spasm of both lower legs 02/20/2022   Severe protein-calorie malnutrition (HCC) 02/20/2022   Diarrhea    Rhabdomyolysis 02/14/2022   Electrolyte abnormality 02/13/2022   AKI (acute kidney injury) (HCC) 02/12/2022   Sepsis (HCC) 02/11/2022   S/P IVC filter 09/11/2015   Cholelithiasis 09/11/2015   Anxiety 09/10/2015   Edema 09/10/2015   Knee pain 09/10/2015   Groin pain 09/10/2015   Vitamin D deficiency 09/10/2015   Obesity, morbid (HCC) 09/10/2015   Anemia, iron deficiency 09/25/2009   Pain in joint, multiple sites 09/22/2009   Essential (primary) hypertension 05/31/2009   Lipoma of lower extremity 05/31/2009   Palpitations 05/31/2009   GERD (gastroesophageal reflux  disease) 05/31/2009   Obstructive sleep apnea 12/29/1998    Palliative Care Assessment & Plan     Recommendations/Plan: Continue full code/full scope care.  Code Status:    Code Status Orders  (From admission, onward)           Start     Ordered   04/21/23 0352  Full code  Continuous       Question:  By:  Answer:  Consent: discussion documented in EHR   04/21/23 0352           Code Status History     Date Active Date Inactive Code Status Order ID Comments User Context   02/11/2022 0225 02/17/2022 2059 Full Code [id number]  Mansy, Vernetta Honey, MD ED       Prognosis:  Unable to determine  Thank you for allowing the Palliative Medicine Team to assist in the care of this patient.    Morton Stall, NP  Please contact Palliative Medicine Team phone at [phone] for questions and concerns.

## 2023-04-29 DIAGNOSIS — Z7189 Other specified counseling: Secondary | ICD-10-CM | POA: Diagnosis not present

## 2023-04-29 DIAGNOSIS — E039 Hypothyroidism, unspecified: Secondary | ICD-10-CM | POA: Diagnosis not present

## 2023-04-29 LAB — RENAL FUNCTION PANEL
Albumin: 2.6 g/dL — ABNORMAL LOW (ref 3.5–5.0)
Anion gap: 8 (ref 5–15)
BUN: 44 mg/dL — ABNORMAL HIGH (ref 6–20)
CO2: 17 mmol/L — ABNORMAL LOW (ref 22–32)
Calcium: 8.1 mg/dL — ABNORMAL LOW (ref 8.9–10.3)
Chloride: 105 mmol/L (ref 98–111)
Creatinine, Ser: 1.56 mg/dL — ABNORMAL HIGH (ref 0.61–1.24)
GFR, Estimated: 55 mL/min — ABNORMAL LOW (ref >60)
Glucose, Bld: 100 mg/dL — ABNORMAL HIGH (ref 70–99)
Phosphorus: 3.1 mg/dL (ref 2.5–4.6)
Potassium: 4 mmol/L (ref 3.5–5.1)
Sodium: 130 mmol/L — ABNORMAL LOW (ref 135–145)

## 2023-04-29 LAB — CBC WITH DIFFERENTIAL/PLATELET
Abs Immature Granulocytes: 0.41 10*3/uL — ABNORMAL HIGH (ref 0.00–0.07)
Basophils Absolute: 0 10*3/uL (ref 0.0–0.1)
Basophils Relative: 0 %
Eosinophils Absolute: 0.3 10*3/uL (ref 0.0–0.5)
Eosinophils Relative: 1 %
HCT: 25.8 % — ABNORMAL LOW (ref 39.0–52.0)
Hemoglobin: 8.5 g/dL — ABNORMAL LOW (ref 13.0–17.0)
Immature Granulocytes: 2 %
Lymphocytes Relative: 11 %
Lymphs Abs: 2.3 10*3/uL (ref 0.7–4.0)
MCH: 27.2 pg (ref 26.0–34.0)
MCHC: 32.9 g/dL (ref 30.0–36.0)
MCV: 82.7 fL (ref 80.0–100.0)
Monocytes Absolute: 1.3 10*3/uL — ABNORMAL HIGH (ref 0.1–1.0)
Monocytes Relative: 6 %
Neutro Abs: 16.9 10*3/uL — ABNORMAL HIGH (ref 1.7–7.7)
Neutrophils Relative %: 80 %
Platelets: 400 10*3/uL (ref 150–400)
RBC: 3.12 MIL/uL — ABNORMAL LOW (ref 4.22–5.81)
RDW: 20.6 % — ABNORMAL HIGH (ref 11.5–15.5)
WBC: 21.2 10*3/uL — ABNORMAL HIGH (ref 4.0–10.5)
nRBC: 0 % (ref 0.0–0.2)

## 2023-04-29 LAB — VANCOMYCIN, RANDOM: Vancomycin Rm: 45 ug/mL

## 2023-04-29 LAB — GLUCOSE, CAPILLARY
Glucose-Capillary: 100 mg/dL — ABNORMAL HIGH (ref 70–99)
Glucose-Capillary: 92 mg/dL (ref 70–99)
Glucose-Capillary: 110 mg/dL — ABNORMAL HIGH (ref 70–99)

## 2023-04-29 LAB — CULTURE, BLOOD (ROUTINE X 2): Special Requests: ADEQUATE

## 2023-04-29 MED ORDER — RIFAXIMIN 550 MG PO TABS
550.0000 mg | ORAL_TABLET | Freq: Three times a day (TID) | ORAL | Status: AC
  Administered 2023-04-29 – 2023-05-12 (×40): 550 mg via ORAL
  Filled 2023-04-29 (×42): qty 1

## 2023-04-29 MED ORDER — MIRTAZAPINE 15 MG PO TABS
7.5000 mg | ORAL_TABLET | Freq: Every day | ORAL | Status: DC
  Administered 2023-04-29: 7.5 mg via ORAL
  Filled 2023-04-29: qty 1

## 2023-04-29 MED ORDER — PANCRELIPASE (LIP-PROT-AMYL) 12000-38000 UNITS PO CPEP
24000.0000 [IU] | ORAL_CAPSULE | Freq: Three times a day (TID) | ORAL | Status: DC
  Administered 2023-04-29 – 2023-05-18 (×51): 24000 [IU] via ORAL
  Filled 2023-04-29 (×57): qty 2

## 2023-04-29 NOTE — Progress Notes (Addendum)
Daily Progress Note   Patient Name: Christopher Herman       Date: 04/29/2023 DOB: 1976/03/04  Age: 47 y.o. MRN#: [id number] Attending Physician: Tresa Moore, MD Primary Care Physician: Pcp, No Admit Date: 04/21/2023  Reason for Consultation/Follow-up: Establishing goals of care  Subjective: Notes and labs reviewed. Included in epic chat between RD, attending and myself regarding plans moving forward.  In to see patient. He is resting in bed. He inquires about protein rich food options. RD has been working with patient; made her aware of his request to speak with her.   Could consider psychiatry evaluation as he has discussed being depressed.    Length of Stay: 8  Current Medications: Scheduled Meds:   vitamin C  500 mg Oral BID   Chlorhexidine Gluconate Cloth  6 each Topical Daily   copper  8 mg Oral Daily   feeding supplement (KATE FARMS STANDARD 1.4)  325 mL Oral TID BM   folic acid  1 mg Oral Daily   heparin injection (subcutaneous)  5,000 Units Subcutaneous Q8H   hydrocortisone sod succinate (SOLU-CORTEF) inj  100 mg Intravenous Daily   insulin aspart  0-15 Units Subcutaneous TID WC   lactase  6,000 Units Oral TID WC   levothyroxine  100 mcg Oral Q0600   lipase/protease/amylase  12,000 Units Oral TID AC   midodrine  10 mg Oral TID WC   multivitamin  15 mL Oral Daily   pantoprazole (PROTONIX) IV  40 mg Intravenous Q24H   polyethylene glycol  17 g Oral Daily   thiamine  100 mg Oral Daily   vancomycin variable dose per unstable renal function (pharmacist dosing)   Does not apply See admin instructions   vitamin A  10,000 Units Oral Daily   Vitamin D (Ergocalciferol)  50,000 Units Oral Q7 days   vitamin E  400 Units Oral Daily   zinc sulfate  220 mg Oral Daily     Continuous Infusions:  albumin human Stopped (04/28/23 1302)    PRN Meds: acetaminophen, bismuth subsalicylate, loperamide, polyethylene glycol  Physical Exam Pulmonary:     Effort: Pulmonary effort is normal.  Neurological:     Mental Status: He is alert.             Vital Signs: BP 105/77   Pulse Marland Kitchen)  115   Temp (!) 97.5 F (36.4 C) (Oral)   Resp 18   Ht 5\' 5"  (1.651 m)   Wt (!) 169 kg   SpO2 100%   BMI 62.00 kg/m  SpO2: SpO2: 100 % O2 Device: O2 Device: Room Air O2 Flow Rate:    Intake/output summary:  Intake/Output Summary (Last 24 hours) at 04/29/2023 0830 Last data filed at 04/28/2023 1926 Gross per 24 hour  Intake 21 ml  Output 250 ml  Net -229 ml   LBM: Last BM Date : 04/28/23 Baseline Weight: Weight: (!) 180.1 kg Most recent weight: Weight: (!) 169 kg     Patient Active Problem List   Diagnosis Date Noted   Acute renal failure due to tubular necrosis (HCC) 04/26/2023   Hypothyroidism 04/26/2023   Hypotension due to hypovolemia 04/24/2023   Malnutrition of moderate degree (HCC) 04/23/2023   Generalized weakness 04/23/2023   Ulcer of right ankle (HCC) 04/23/2023   Severe hypothyroidism 04/21/2023   Chronic diarrhea of unknown origin 07/11/2022   Muscle spasm of both lower legs 02/20/2022   Severe protein-calorie malnutrition (HCC) 02/20/2022   Diarrhea    Rhabdomyolysis 02/14/2022   Electrolyte abnormality 02/13/2022   AKI (acute kidney injury) (HCC) 02/12/2022   Sepsis (HCC) 02/11/2022   S/P IVC filter 09/11/2015   Cholelithiasis 09/11/2015   Anxiety 09/10/2015   Edema 09/10/2015   Knee pain 09/10/2015   Groin pain 09/10/2015   Vitamin D deficiency 09/10/2015   Obesity, morbid (HCC) 09/10/2015   Anemia, iron deficiency 09/25/2009   Pain in joint, multiple sites 09/22/2009   Essential (primary) hypertension 05/31/2009   Lipoma of lower extremity 05/31/2009   Palpitations 05/31/2009   GERD (gastroesophageal reflux disease) 05/31/2009    Obstructive sleep apnea 12/29/1998    Palliative Care Assessment & Plan     Recommendations/Plan: Continue full code/ full scope. Patient has advised that if he ever states he wants any care besides full code/ full scope to speak with his mother, because that would  be uncharacteristic of him.    Agree with Remeron. Could consider psychiatry evaluation as he has discussed being depressed.       Code Status:    Code Status Orders  (From admission, onward)           Start     Ordered   04/21/23 0352  Full code  Continuous       Question:  By:  Answer:  Consent: discussion documented in EHR   04/21/23 0352           Code Status History     Date Active Date Inactive Code Status Order ID Comments User Context   02/11/2022 0225 02/17/2022 2059 Full Code [id number]  Mansy, Vernetta Honey, MD ED         Thank you for allowing the Palliative Medicine Team to assist in the care of this patient.    Morton Stall, NP  Please contact Palliative Medicine Team phone at [phone] for questions and concerns.

## 2023-04-29 NOTE — Progress Notes (Signed)
[facility] Infectious Diseases Follow Up Note  Patient Identification: Patient Name: Christopher Herman MRN: [id number] Admit Date: 04/21/2023  1:43 AM Age: 47 y.o.Today's Date: 04/29/2023  Reason for Visit: bacteremia  Principal Problem:   Severe hypothyroidism Active Problems:   Malnutrition of moderate degree (HCC)   Generalized weakness   Ulcer of right ankle (HCC)   Hypotension due to hypovolemia   Acute renal failure due to tubular necrosis (HCC)   Hypothyroidism  Current Antibiotics:  Vancomycin on hold  Total days of abtx 5  Lines/Hardware: Right IJ CVC   Interval Events: afebrile, WBC downtrending, repeat blood cx 4/20 NG in 1 day Transferred back to ICU due to soft BP   Assessment 47 year old male with multiple comorbidities including morbid obesity s/p gastric bypass with severe malnutrition, B/L LE lipoma, chronic lyphedema s/p IVC filter placement, h/o chronic leukocytosis s/p incomplete evaluation to ro malignancy by Oncology in 02/2022  who presented to ED on 4/23 with complaint of fatigue/unwell for 2 days as well as decrease in appetite and loss of weight.  Admitted to ICU with initial concerns for sepsis and septic shock and brief requirement of vasopressors.    # Positive blood cultures  4/23 Blood cx staph capitis in both sets ( discordant sensi's and likely contaminant), staph epidermidis in 1/2 set ( likely contaminant), staph auricularis in 1/2 sets 4/25 Blood cx staph auricularis in 1/2 sets, sensi pending  4/30 Blood cx 2/2 sets NG in 1 days  Vancomycin on hold due to supra therapeutic trough  # Diarrhea  # Hypotension  - reported related to feeding supplement. Denies abdominal cramps. Unlikely C diff  - On midodrine  - RD has been consulted for dietary assessment   # Depression  - Psych has been consulted   # Hypoalbuminemia/malnutrition/Volume overload - Nephrology and RD following  # Severe  Hypothyroidism - on synthroid and hydrocortisone, primary managing   Recommendations Fu sensi's of steph auricularis but suspect positive blood cultures are contaminant  Vancomycin on hold currently due to supra therapeutic troughs Following  Rest of the management as per the primary team. Thank you for the consult. Please page with pertinent questions or concerns.  ______________________________________________________________________ Subjective patient seen and examined at the bedside. Complains of fatigue. Eating better, has loose stools which he reports is due to feeding supplement. Spoke to his mother per patient's request over phone.    Vitals BP 105/77 (BP Location: Right Arm)   Pulse (!) 115   Temp 97.8 F (36.6 C) (Oral)   Resp 18   Ht 5\' 5"  (1.651 m)   Wt (!) 169 kg   SpO2 100%   BMI 62.00 kg/m     Physical Exam Constitutional:  chronically ill appearing obese male lying in the bed and appears tired     Comments:   Cardiovascular:     Rate and Rhythm: Normal rate and regular rhythm.     Heart sounds:   Pulmonary:     Effort: Pulmonary effort is normal.     Comments:   Abdominal:     Palpations: Abdomen is soft.     Tenderness: large abdomen   Musculoskeletal:        General: chronic lymphedema in bilateral legs     Comments:   Neurological:     General: awake, alert and oriented, follows commands , weakness in the upper extremities   Psychiatric:        Mood and Affect: Mood normal.   Pertinent  Microbiology Results for orders placed or performed during the hospital encounter of 04/21/23  Blood Culture (routine x 2)     Status: Abnormal   Collection Time: 04/21/23  2:02 AM   Specimen: BLOOD  Result Value Ref Range Status   Specimen Description   Final    BLOOD RIGHT ARM Performed at [facility], [address] [place], [place] [address]    Special Requests   Final    BOTTLES DRAWN AEROBIC AND ANAEROBIC Blood Culture adequate  volume Performed at [facility] [place], [address] [place], [place] [address]    Culture  Setup Time   Final    GRAM POSITIVE COCCI IN BOTH AEROBIC AND ANAEROBIC BOTTLES CRITICAL VALUE NOTED.  VALUE IS CONSISTENT WITH PREVIOUSLY REPORTED AND CALLED VALUE. Performed at [facility], [address] [place], [place] [address]    Culture (A)  Final    STAPHYLOCOCCUS CAPITIS STAPHYLOCOCCUS AURICULARIS    Report Status 04/26/2023 FINAL  Final   Organism ID, Bacteria STAPHYLOCOCCUS CAPITIS  Final   Organism ID, Bacteria STAPHYLOCOCCUS AURICULARIS  Final      Susceptibility   Staphylococcus auricularis - MIC*    CIPROFLOXACIN 4 RESISTANT Resistant     ERYTHROMYCIN >=8 RESISTANT Resistant     GENTAMICIN <=0.5 SENSITIVE Sensitive     OXACILLIN >=4 RESISTANT Resistant     TETRACYCLINE <=1 SENSITIVE Sensitive     VANCOMYCIN <=0.5 SENSITIVE Sensitive     TRIMETH/SULFA <=10 SENSITIVE Sensitive     CLINDAMYCIN RESISTANT Resistant     RIFAMPIN <=0.5 SENSITIVE Sensitive     Inducible Clindamycin POSITIVE Resistant     * STAPHYLOCOCCUS AURICULARIS   Staphylococcus capitis - MIC*    CIPROFLOXACIN 4 RESISTANT Resistant     ERYTHROMYCIN >=8 RESISTANT Resistant     GENTAMICIN <=0.5 SENSITIVE Sensitive     OXACILLIN >=4 RESISTANT Resistant     TETRACYCLINE >=16 RESISTANT Resistant     VANCOMYCIN <=0.5 SENSITIVE Sensitive     TRIMETH/SULFA <=10 SENSITIVE Sensitive     CLINDAMYCIN INTERMEDIATE Intermediate     RIFAMPIN 1 SENSITIVE Sensitive     Inducible Clindamycin NEGATIVE Sensitive     * STAPHYLOCOCCUS CAPITIS  SARS Coronavirus 2 by RT PCR (hospital order, performed in [facility] hospital lab) *cepheid single result test* Anterior Nasal Swab     Status: None   Collection Time: 04/21/23  2:18 AM   Specimen: Anterior Nasal Swab  Result Value Ref Range Status   SARS Coronavirus 2 by RT PCR NEGATIVE NEGATIVE Final    Comment: (NOTE) SARS-CoV-2 target nucleic acids are NOT  DETECTED.  The SARS-CoV-2 RNA is generally detectable in upper and lower respiratory specimens during the acute phase of infection. The lowest concentration of SARS-CoV-2 viral copies this assay can detect is 250 copies / mL. A negative result does not preclude SARS-CoV-2 infection and should not be used as the sole basis for treatment or other patient management decisions.  A negative result may occur with improper specimen collection / handling, submission of specimen other than nasopharyngeal swab, presence of viral mutation(s) within the areas targeted by this assay, and inadequate number of viral copies (<250 copies / mL). A negative result must be combined with clinical observations, patient history, and epidemiological information.  Fact Sheet for Patients:   RoadLapTop.co.za  Fact Sheet for Healthcare Providers: http://kim-miller.com/  This test is not yet approved or  cleared by the [place] FDA and has been authorized for detection and/or diagnosis  of SARS-CoV-2 by FDA under an Emergency Use Authorization (EUA).  This EUA will remain in effect (meaning this test can be used) for the duration of the COVID-19 declaration under Section 564(b)(1) of the Act, 21 U.S.C. section 360bbb-3(b)(1), unless the authorization is terminated or revoked sooner.  Performed at [facility], [address] [place], [place] [address]   Blood Culture (routine x 2)     Status: Abnormal   Collection Time: 04/21/23  2:36 AM   Specimen: BLOOD  Result Value Ref Range Status   Specimen Description   Final    BLOOD  LEFT [facility] Performed at [facility], [address] [place], [place] [address]    Special Requests   Final    BOTTLES DRAWN AEROBIC AND ANAEROBIC Blood Culture adequate volume Performed at [facility], [address] [place], [place] [address]    Culture  Setup Time   Final    GRAM POSITIVE  COCCI AEROBIC BOTTLE ONLY CRITICAL RESULT CALLED TO, READ BACK BY AND VERIFIED WITH: JASON ROBINS AT 2213 ON 04/21/23 BY SS    Culture (A)  Final    STAPHYLOCOCCUS CAPITIS UNABLE TO RECOVER SEPI IN CULTURE Performed at [facility], [address], [place], [place] [address]    Report Status 04/26/2023 FINAL  Final   Organism ID, Bacteria STAPHYLOCOCCUS CAPITIS  Final      Susceptibility   Staphylococcus capitis - MIC*    CIPROFLOXACIN <=0.5 SENSITIVE Sensitive     ERYTHROMYCIN >=8 RESISTANT Resistant     GENTAMICIN <=0.5 SENSITIVE Sensitive     OXACILLIN <=0.25 SENSITIVE Sensitive     TETRACYCLINE >=16 RESISTANT Resistant     VANCOMYCIN <=0.5 SENSITIVE Sensitive     TRIMETH/SULFA <=10 SENSITIVE Sensitive     CLINDAMYCIN RESISTANT Resistant     RIFAMPIN <=0.5 SENSITIVE Sensitive     Inducible Clindamycin POSITIVE Resistant     * STAPHYLOCOCCUS CAPITIS  Blood Culture ID Panel (Reflexed)     Status: Abnormal   Collection Time: 04/21/23  2:36 AM  Result Value Ref Range Status   Enterococcus faecalis NOT DETECTED NOT DETECTED Final   Enterococcus Faecium NOT DETECTED NOT DETECTED Final   Listeria monocytogenes NOT DETECTED NOT DETECTED Final   Staphylococcus species DETECTED (A) NOT DETECTED Final    Comment: CRITICAL RESULT CALLED TO, READ BACK BY AND VERIFIED WITH: JASON ROBINS AT 2213 ON 04/21/23 BY SS    Staphylococcus aureus (BCID) NOT DETECTED NOT DETECTED Final   Staphylococcus epidermidis DETECTED (A) NOT DETECTED Final    Comment: Methicillin (oxacillin) resistant coagulase negative staphylococcus. Possible blood culture contaminant (unless isolated from more than one blood culture draw or clinical case suggests pathogenicity). No antibiotic treatment is indicated for blood  culture contaminants. CRITICAL RESULT CALLED TO, READ BACK BY AND VERIFIED WITH: JASON ROBINS AT 2213 ON 04/21/23 BY SS    Staphylococcus lugdunensis NOT DETECTED NOT DETECTED Final    Streptococcus species NOT DETECTED NOT DETECTED Final   Streptococcus agalactiae NOT DETECTED NOT DETECTED Final   Streptococcus pneumoniae NOT DETECTED NOT DETECTED Final   Streptococcus pyogenes NOT DETECTED NOT DETECTED Final   A.calcoaceticus-baumannii NOT DETECTED NOT DETECTED Final   Bacteroides fragilis NOT DETECTED NOT DETECTED Final   Enterobacterales NOT DETECTED NOT DETECTED Final   Enterobacter cloacae complex NOT DETECTED NOT DETECTED Final   Escherichia coli NOT DETECTED NOT DETECTED Final   Klebsiella aerogenes NOT DETECTED NOT DETECTED Final   Klebsiella oxytoca NOT DETECTED  NOT DETECTED Final   Klebsiella pneumoniae NOT DETECTED NOT DETECTED Final   Proteus species NOT DETECTED NOT DETECTED Final   Salmonella species NOT DETECTED NOT DETECTED Final   Serratia marcescens NOT DETECTED NOT DETECTED Final   Haemophilus influenzae NOT DETECTED NOT DETECTED Final   Neisseria meningitidis NOT DETECTED NOT DETECTED Final   Pseudomonas aeruginosa NOT DETECTED NOT DETECTED Final   Stenotrophomonas maltophilia NOT DETECTED NOT DETECTED Final   Candida albicans NOT DETECTED NOT DETECTED Final   Candida auris NOT DETECTED NOT DETECTED Final   Candida glabrata NOT DETECTED NOT DETECTED Final   Candida krusei NOT DETECTED NOT DETECTED Final   Candida parapsilosis NOT DETECTED NOT DETECTED Final   Candida tropicalis NOT DETECTED NOT DETECTED Final   Cryptococcus neoformans/gattii NOT DETECTED NOT DETECTED Final   Methicillin resistance mecA/C DETECTED (A) NOT DETECTED Final    Comment: CRITICAL RESULT CALLED TO, READ BACK BY AND VERIFIED WITH: JASON ROBINS AT 2213 ON 04/21/23 BY SS Performed at [facility], [address], [place], [place] [address]   Urine Culture (for pregnant, neutropenic or urologic patients or patients with an indwelling urinary catheter)     Status: None   Collection Time: 04/22/23  5:22 PM   Specimen: Urine, Clean Catch  Result Value Ref Range  Status   Specimen Description   Final    URINE, CLEAN CATCH Performed at [facility], [address], [place], [place] [address]    Special Requests   Final    NONE Performed at [facility], [address] [place], [place] [address]    Culture   Final    NO GROWTH Performed at [facility], [address], [place], [place] [address]    Report Status 04/23/2023 FINAL  Final  Culture, blood (Routine X 2) w Reflex to ID Panel     Status: Abnormal (Preliminary result)   Collection Time: 04/23/23  6:10 AM   Specimen: BLOOD  Result Value Ref Range Status   Specimen Description   Final    BLOOD  LEFT HAND Performed at [facility], [address], [place], [place] [address]    Special Requests   Final    BOTTLES DRAWN AEROBIC ONLY Blood Culture results may not be optimal due to an inadequate volume of blood received in culture bottles Performed at [facility], [address], [place], [place] [address]    Culture  Setup Time   Final    GRAM POSITIVE COCCI AEROBIC BOTTLE ONLY CRITICAL RESULT CALLED TO, READ BACK BY AND VERIFIED WITH: DEVON MITCHELL 04/24/2023 1422 CP Performed at [facility], [address], [place], [place] [address]    Culture (A)  Final    STAPHYLOCOCCUS AURICULARIS SUSCEPTIBILITIES TO FOLLOW Performed at [facility], [address], [place], [place] [address]    Report Status PENDING  Incomplete  Culture, blood (Routine X 2) w Reflex to ID Panel     Status: None   Collection Time: 04/23/23  6:21 AM   Specimen: BLOOD  Result Value Ref Range Status   Specimen Description BLOOD  RIGHT HAND  Final   Special Requests   Final    BOTTLES DRAWN AEROBIC AND ANAEROBIC Blood Culture adequate volume   Culture   Final    NO GROWTH 5 DAYS Performed at [facility], [address], [place], [place] [address]    Report Status 04/28/2023 FINAL  Final  Culture, blood (  Routine X 2) w Reflex to ID  Panel     Status: None (Preliminary result)   Collection Time: 04/28/23  7:05 AM   Specimen: BLOOD  Result Value Ref Range Status   Specimen Description BLOOD  RIGHT ARM  Final   Special Requests   Final    BOTTLES DRAWN AEROBIC AND ANAEROBIC Blood Culture adequate volume   Culture   Final    NO GROWTH 1 DAY Performed at [facility], [address], [place], [place] [address]    Report Status PENDING  Incomplete  Culture, blood (Routine X 2) w Reflex to ID Panel     Status: None (Preliminary result)   Collection Time: 04/28/23  7:05 AM   Specimen: BLOOD  Result Value Ref Range Status   Specimen Description BLOOD  LEFT AC  Final   Special Requests   Final    BOTTLES DRAWN AEROBIC AND ANAEROBIC Blood Culture results may not be optimal due to an excessive volume of blood received in culture bottles   Culture  Setup Time PENDING  Incomplete   Culture   Final    NO GROWTH 1 DAY Performed at [facility], [address], [place], [place] [address]    Report Status PENDING  Incomplete   Pertinent Lab.    Latest Ref Rng & Units 04/29/2023    6:02 AM 04/28/2023    7:03 AM 04/27/2023    6:31 PM  CBC  WBC 4.0 - 10.5 K/uL 21.2  30.0  25.3   Hemoglobin 13.0 - 17.0 g/dL 8.5  9.1  8.7   Hematocrit 39.0 - 52.0 % 25.8  28.4  27.1   Platelets 150 - 400 K/uL 400  501  369       Latest Ref Rng & Units 04/29/2023    6:02 AM 04/28/2023    7:03 AM 04/27/2023    9:07 AM  CMP  Glucose 70 - 99 mg/dL 130  99  95   BUN 6 - 20 mg/dL 44  40  37   Creatinine 0.61 - 1.24 mg/dL 8.65  7.84  6.96   Sodium 135 - 145 mmol/L 130  129  129   Potassium 3.5 - 5.1 mmol/L 4.0  4.4  4.5   Chloride 98 - 111 mmol/L 105  103  102   CO2 22 - 32 mmol/L 17  15  16    Calcium 8.9 - 10.3 mg/dL 8.1  8.2  8.0   Total Protein 6.5 - 8.1 g/dL   5.2   Total Bilirubin 0.3 - 1.2 mg/dL   1.1   Alkaline Phos 38 - 126 U/L   104   AST 15 - 41 U/L   16   ALT 0 - 44 U/L   19     Pertinent Imaging today Plain  films and CT images have been personally visualized and interpreted; radiology reports have been reviewed. Decision making incorporated into the Impression /   No results found.  I have personally spent at least 37 minutes involved in face-to-face and non-face-to-face activities for this patient on the day of the visit. Professional time spent includes the following activities: Preparing to see the patient (review of tests), Obtaining and/or reviewing separately obtained history (admission/discharge record), Performing a medically appropriate examination and/or evaluation , Ordering medications/tests/procedures, referring and communicating with other health care professionals, Documenting clinical information in the EMR, Independently interpreting results (not separately reported), Communicating results to the patient/family/caregiver, Counseling and educating the patient/family/caregiver  and Care coordination (not separately reported).   Plan d/w requesting provider as well as ID pharm D  Note: This document was prepared using dragon voice recognition software and may include unintentional dictation errors.   Electronically signed by:   Odette Fraction, MD Infectious Disease Physician [facility] for Infectious Disease Pager: [phone]

## 2023-04-29 NOTE — Progress Notes (Signed)
Nutrition Follow Up Note   DOCUMENTATION CODES:   Non-severe (moderate) malnutrition in context of chronic illness  INTERVENTION:   Molli Posey supplement chocolate po TID- each supplement provides 325kcal and 16g protein  Pro-Source 45ml QID via tube, provides 40kcal and 11g of protein per serving   Liquid MVI daily   Vitamin C 500mg  po BID  Folic acid 1mg  po daily   Thiamine 100mg  po daily   Copper 8mg  po daily   Vitamin A 10,000 units po daily   Ergocalciferol 50,000 units po weekly  Vitamin E 400 units po daily   Zinc 220mg  po daily    Pt at high refeed risk; recommend monitor potassium, magnesium and phosphorus labs daily until stable  Lactaid with meals   Creon 24,000 units po with meals  NUTRITION DIAGNOSIS:   Moderate Malnutrition related to chronic illness (h/o roux-en-y gastric bypass) as evidenced by moderate fat depletion, moderate muscle depletion, 35 percent weight loss in one year. -ongoing   GOAL:   Patient will meet greater than or equal to 90% of their needs -not met   MONITOR:   PO intake, Supplement acceptance, Labs, Weight trends, Skin, I & O's  ASSESSMENT:   47 y/o male with h/o hypothyroidism, anxiety, morbid obesity s/p roux-en-y (2013), anxiety, HTN, OSA, GERD, IVC filter, H. pylori, lymphedema and cholelithiasis who is admitted with septic shock.  Met with pt in room today. Pt continues to refuse meals. Pt reports that he continues to have early satiety, bloating and diarrhea within 30 minutes after eating. Pt reports that he is having diarrhea at least 6-7 times a day. Pt is fearful to eat. Pt reports that he is drinking the [facility] but also reports that this causes him to have diarrhea. Per chart, it appears pt has been having diarrhea on an off for at least a year. Pt is worried about lactose; pt is ordered for lactaid with meals. Creon initiated with meals; creon increased to 24,000 units today. Xifaxan initiated today to help with  bloating and diarrhea as pt at increased risk for SIBO and IBS r/t his roux-en-y. Remeron initiated to help with appetite. RD again discussed with pt the importance of adequate nutrition needed to preserve lean muscle and support wound healing. Pt is asking a lot of questions about protein containing foods and nutrition today. RD provided nutrition education for pt and pt's mother via the phone. Pt denies feeling depressed today; pt reports that he is just tired. Pt's mother reports that she does not feel pt is depressed and that she feels he is happy. Pt with numerous vitamin deficiencies that can cause diarrhea, weakness and depression; pt is receiving supplementation. Per chart, pt is down ~25lbs since admission; RD unsure how much of this is true weight loss. May need to consider NGT placement and nutrition support if pt's oral intake does not improve; this was discussed with pt's mother and pt today. Pt appears to be indifferent about tube placement. Pt remains at high refeed risk.     Medications reviewed and include: vitamin C, copper, folic acid, heparin, solu-cortef, lactaid, insulin, synthroid, creon, protonix, miralax, thiamine, vitamin A, ergocalciferol, vitamin E, zinc, albumin   Labs reviewed: Na 130(L), K 4.0 wnl, BUN 44(H), creat 1.56(H), P 3.1 wnl Iron 21(L), TIBC 113(L), ferritin 116, folate 6.6 wnl- 4/24 Copper 59(L), B1 151.4 wnl, Vitamin D- 25.81(L), vitamin A 4.3(L), B12 2353(H), vitamin C 0.2(L), vitamin E 7.3 wnl, zinc 36(L), vitamin K <1.0(L)- 4/24 Wbc-  21.2(H), Hgb 8.5(L), Hct 25.8(L) Cbgs- 92, 100 x 24 hrs   Diet Order:   Diet Order             Diet regular Room service appropriate? Yes with Assist; Fluid consistency: Thin  Diet effective now                  EDUCATION NEEDS:   Education needs have been addressed  Skin:  Skin Assessment: Reviewed RN Assessment (Right lateral malleolus:  0.3 cm x 0.2 cm x 0.1 cm, right buttocks ( in skin fold) 1 cm, edema to  bilateral lower legs  nonintact lesion to left upper thigh: 3 cm x 5.2 cm)  Last BM:  5/1- type 6  Height:   Ht Readings from Last 1 Encounters:  04/21/23 5\' 5"  (1.651 m)    Weight:   Wt Readings from Last 1 Encounters:  04/29/23 (!) 169 kg    Ideal Body Weight:  61.8 kg  BMI:  Body mass index is 62 kg/m.  Estimated Nutritional Needs:   Kcal:  2700-3000kcal/day  Protein:  >135g/day  Fluid:  1.9-2.2L/day  Betsey Holiday MS, RD, LDN Please refer to [facility] for RD and/or RD on-call/weekend/after hours pager

## 2023-04-29 NOTE — Progress Notes (Signed)
[facility] [place], [place] 04/29/23  Subjective:   Hospital day # 8  Patient seen and evaluated in ICU Sodium 130 Serum creatinine continues to increase slowly Patient has pure wick  Skin continues to weep, frequent dressing changes reported by nursing Edema present   Renal: 04/30 0701 - 05/01 0700 In: 21 [IV Piggyback:21] Out: 250 [Urine:250] Lab Results  Component Value Date   CREATININE 1.56 (H) 04/29/2023   CREATININE 1.49 (H) 04/28/2023   CREATININE 1.40 (H) 04/27/2023     Objective:  Vital signs in last 24 hours:  Temp:  [97.5 F (36.4 C)-98.3 F (36.8 C)] 97.8 F (36.6 C) (05/01 0800) Pulse Rate:  [81-127] 118 (05/01 1000) Resp:  [11-18] 17 (05/01 1000) BP: (84-112)/(22-80) 87/22 (05/01 1000) SpO2:  [98 %-100 %] 100 % (05/01 1000) Weight:  [169 kg] 169 kg (05/01 0500)  Weight change: -3.2 kg Filed Weights   04/26/23 1336 04/28/23 0321 04/29/23 0500  Weight: (!) 171.3 kg (!) 172.2 kg (!) 169 kg    Intake/Output:    Intake/Output Summary (Last 24 hours) at 04/29/2023 1132 Last data filed at 04/29/2023 1000 Gross per 24 hour  Intake 441 ml  Output 250 ml  Net 191 ml      Physical Exam: General: Super morbidly obese gentleman, laying in the bed  HEENT Moist oral mucous membranes  Pulm/lungs Normal breathing effort room air  CVS/Heart No rub  Abdomen:  Obese, nontender  Extremities: Significant dependent peripheral edema on the thighs  Neurologic: Alert, oriented  Skin: Lichenified skin over calves and lower thighs  Dialysis Access: None at present       Basic Metabolic Panel:  Recent Labs  Lab 04/23/23 0535 04/24/23 0430 04/25/23 0540 04/26/23 0140 04/26/23 0455 04/27/23 0623 04/27/23 0907 04/28/23 0703 04/29/23 0602  NA 130* 126* 128* 131* 128*  --  129* 129* 130*  K 3.3* 3.2* 4.0 4.6 4.8  --  4.5 4.4 4.0  CL 98 96* 101 103 103  --  102 103 105  CO2 20* 20* 16* 16* 15*  --  16* 15* 17*  GLUCOSE 102* 116*  114* 91 94  --  95 99 100*  BUN 31* 36* 37* 37* 37*  --  37* 40* 44*  CREATININE 0.89 1.06 1.27* 1.36* 1.33*  --  1.40* 1.49* 1.56*  CALCIUM 7.8* 7.5* 7.7* 8.0* 7.8*  --  8.0* 8.2* 8.1*  MG 2.0 2.0  --   --   --   --   --   --   --   PHOS 2.8 2.9 3.0  --  2.7 2.7  --  3.1  3.0 3.1      CBC: Recent Labs  Lab 04/25/23 0540 04/26/23 0140 04/26/23 0455 04/27/23 0623 04/27/23 1831 04/28/23 0703 04/29/23 0602  WBC 33.2*   < > 31.3* 27.1* 25.3* 30.0* 21.2*  NEUTROABS 29.1*  --  24.9* 21.0*  --  24.2* 16.9*  HGB 8.2*   < > 7.5* 7.2* 8.7* 9.1* 8.5*  HCT 25.8*   < > 23.5* 22.2* 27.1* 28.4* 25.8*  MCV 85.4   < > 84.5 85.1 85.0 84.0 82.7  PLT 514*   < > 462* 440* 369 501* 400   < > = values in this interval not displayed.      No results found for: "HEPBSAG", "HEPBSAB", "HEPBIGM"    Microbiology:  Recent Results (from the past 240 hour(s))  Blood Culture (routine x 2)     Status:  Abnormal   Collection Time: 04/21/23  2:02 AM   Specimen: BLOOD  Result Value Ref Range Status   Specimen Description   Final    BLOOD RIGHT ARM Performed at [facility], [address] [place], [place] [address]    Special Requests   Final    BOTTLES DRAWN AEROBIC AND ANAEROBIC Blood Culture adequate volume Performed at [facility], [address] [place], [place] [address]    Culture  Setup Time   Final    GRAM POSITIVE COCCI IN BOTH AEROBIC AND ANAEROBIC BOTTLES CRITICAL VALUE NOTED.  VALUE IS CONSISTENT WITH PREVIOUSLY REPORTED AND CALLED VALUE. Performed at [facility], [address] [place], [place] [address]    Culture (A)  Final    STAPHYLOCOCCUS CAPITIS STAPHYLOCOCCUS AURICULARIS    Report Status 04/26/2023 FINAL  Final   Organism ID, Bacteria STAPHYLOCOCCUS CAPITIS  Final   Organism ID, Bacteria STAPHYLOCOCCUS AURICULARIS  Final      Susceptibility   Staphylococcus auricularis - MIC*    CIPROFLOXACIN 4 RESISTANT Resistant     ERYTHROMYCIN  >=8 RESISTANT Resistant     GENTAMICIN <=0.5 SENSITIVE Sensitive     OXACILLIN >=4 RESISTANT Resistant     TETRACYCLINE <=1 SENSITIVE Sensitive     VANCOMYCIN <=0.5 SENSITIVE Sensitive     TRIMETH/SULFA <=10 SENSITIVE Sensitive     CLINDAMYCIN RESISTANT Resistant     RIFAMPIN <=0.5 SENSITIVE Sensitive     Inducible Clindamycin POSITIVE Resistant     * STAPHYLOCOCCUS AURICULARIS   Staphylococcus capitis - MIC*    CIPROFLOXACIN 4 RESISTANT Resistant     ERYTHROMYCIN >=8 RESISTANT Resistant     GENTAMICIN <=0.5 SENSITIVE Sensitive     OXACILLIN >=4 RESISTANT Resistant     TETRACYCLINE >=16 RESISTANT Resistant     VANCOMYCIN <=0.5 SENSITIVE Sensitive     TRIMETH/SULFA <=10 SENSITIVE Sensitive     CLINDAMYCIN INTERMEDIATE Intermediate     RIFAMPIN 1 SENSITIVE Sensitive     Inducible Clindamycin NEGATIVE Sensitive     * STAPHYLOCOCCUS CAPITIS  SARS Coronavirus 2 by RT PCR (hospital order, performed in [facility] hospital lab) *cepheid single result test* Anterior Nasal Swab     Status: None   Collection Time: 04/21/23  2:18 AM   Specimen: Anterior Nasal Swab  Result Value Ref Range Status   SARS Coronavirus 2 by RT PCR NEGATIVE NEGATIVE Final    Comment: (NOTE) SARS-CoV-2 target nucleic acids are NOT DETECTED.  The SARS-CoV-2 RNA is generally detectable in upper and lower respiratory specimens during the acute phase of infection. The lowest concentration of SARS-CoV-2 viral copies this assay can detect is 250 copies / mL. A negative result does not preclude SARS-CoV-2 infection and should not be used as the sole basis for treatment or other patient management decisions.  A negative result may occur with improper specimen collection / handling, submission of specimen other than nasopharyngeal swab, presence of viral mutation(s) within the areas targeted by this assay, and inadequate number of viral copies (<250 copies / mL). A negative result must be combined with  clinical observations, patient history, and epidemiological information.  Fact Sheet for Patients:   RoadLapTop.co.za  Fact Sheet for Healthcare Providers: http://kim-miller.com/  This test is not yet approved or  cleared by the [place] FDA and has been authorized for detection and/or diagnosis of SARS-CoV-2 by FDA under an Emergency Use Authorization (EUA).  This EUA will remain in effect (meaning this test can be used) for  the duration of the COVID-19 declaration under Section 564(b)(1) of the Act, 21 U.S.C. section 360bbb-3(b)(1), unless the authorization is terminated or revoked sooner.  Performed at [facility], [address], [place], [place] [address]   Blood Culture (routine x 2)     Status: Abnormal   Collection Time: 04/21/23  2:36 AM   Specimen: BLOOD  Result Value Ref Range Status   Specimen Description   Final    BLOOD  LEFT [facility] Performed at [facility], [address], [place], [place] [address]    Special Requests   Final    BOTTLES DRAWN AEROBIC AND ANAEROBIC Blood Culture adequate volume Performed at [facility], [address] [place], [place] [address]    Culture  Setup Time   Final    GRAM POSITIVE COCCI AEROBIC BOTTLE ONLY CRITICAL RESULT CALLED TO, READ BACK BY AND VERIFIED WITH: JASON ROBINS AT 2213 ON 04/21/23 BY SS    Culture (A)  Final    STAPHYLOCOCCUS CAPITIS UNABLE TO RECOVER SEPI IN CULTURE Performed at [facility], [address], [place], [place] [address]    Report Status 04/26/2023 FINAL  Final   Organism ID, Bacteria STAPHYLOCOCCUS CAPITIS  Final      Susceptibility   Staphylococcus capitis - MIC*    CIPROFLOXACIN <=0.5 SENSITIVE Sensitive     ERYTHROMYCIN >=8 RESISTANT Resistant     GENTAMICIN <=0.5 SENSITIVE Sensitive     OXACILLIN <=0.25 SENSITIVE Sensitive     TETRACYCLINE >=16 RESISTANT Resistant     VANCOMYCIN <=0.5 SENSITIVE Sensitive      TRIMETH/SULFA <=10 SENSITIVE Sensitive     CLINDAMYCIN RESISTANT Resistant     RIFAMPIN <=0.5 SENSITIVE Sensitive     Inducible Clindamycin POSITIVE Resistant     * STAPHYLOCOCCUS CAPITIS  Blood Culture ID Panel (Reflexed)     Status: Abnormal   Collection Time: 04/21/23  2:36 AM  Result Value Ref Range Status   Enterococcus faecalis NOT DETECTED NOT DETECTED Final   Enterococcus Faecium NOT DETECTED NOT DETECTED Final   Listeria monocytogenes NOT DETECTED NOT DETECTED Final   Staphylococcus species DETECTED (A) NOT DETECTED Final    Comment: CRITICAL RESULT CALLED TO, READ BACK BY AND VERIFIED WITH: JASON ROBINS AT 2213 ON 04/21/23 BY SS    Staphylococcus aureus (BCID) NOT DETECTED NOT DETECTED Final   Staphylococcus epidermidis DETECTED (A) NOT DETECTED Final    Comment: Methicillin (oxacillin) resistant coagulase negative staphylococcus. Possible blood culture contaminant (unless isolated from more than one blood culture draw or clinical case suggests pathogenicity). No antibiotic treatment is indicated for blood  culture contaminants. CRITICAL RESULT CALLED TO, READ BACK BY AND VERIFIED WITH: JASON ROBINS AT 2213 ON 04/21/23 BY SS    Staphylococcus lugdunensis NOT DETECTED NOT DETECTED Final   Streptococcus species NOT DETECTED NOT DETECTED Final   Streptococcus agalactiae NOT DETECTED NOT DETECTED Final   Streptococcus pneumoniae NOT DETECTED NOT DETECTED Final   Streptococcus pyogenes NOT DETECTED NOT DETECTED Final   A.calcoaceticus-baumannii NOT DETECTED NOT DETECTED Final   Bacteroides fragilis NOT DETECTED NOT DETECTED Final   Enterobacterales NOT DETECTED NOT DETECTED Final   Enterobacter cloacae complex NOT DETECTED NOT DETECTED Final   Escherichia coli NOT DETECTED NOT DETECTED Final   Klebsiella aerogenes NOT DETECTED NOT DETECTED Final   Klebsiella oxytoca NOT DETECTED NOT DETECTED Final   Klebsiella pneumoniae NOT DETECTED NOT DETECTED Final   Proteus species NOT  DETECTED NOT DETECTED Final   Salmonella  species NOT DETECTED NOT DETECTED Final   Serratia marcescens NOT DETECTED NOT DETECTED Final   Haemophilus influenzae NOT DETECTED NOT DETECTED Final   Neisseria meningitidis NOT DETECTED NOT DETECTED Final   Pseudomonas aeruginosa NOT DETECTED NOT DETECTED Final   Stenotrophomonas maltophilia NOT DETECTED NOT DETECTED Final   Candida albicans NOT DETECTED NOT DETECTED Final   Candida auris NOT DETECTED NOT DETECTED Final   Candida glabrata NOT DETECTED NOT DETECTED Final   Candida krusei NOT DETECTED NOT DETECTED Final   Candida parapsilosis NOT DETECTED NOT DETECTED Final   Candida tropicalis NOT DETECTED NOT DETECTED Final   Cryptococcus neoformans/gattii NOT DETECTED NOT DETECTED Final   Methicillin resistance mecA/C DETECTED (A) NOT DETECTED Final    Comment: CRITICAL RESULT CALLED TO, READ BACK BY AND VERIFIED WITH: JASON ROBINS AT 2213 ON 04/21/23 BY SS Performed at [facility], [address], [place], [place] [address]   Urine Culture (for pregnant, neutropenic or urologic patients or patients with an indwelling urinary catheter)     Status: None   Collection Time: 04/22/23  5:22 PM   Specimen: Urine, Clean Catch  Result Value Ref Range Status   Specimen Description   Final    URINE, CLEAN CATCH Performed at [facility], [address] [place], [place] [address]    Special Requests   Final    NONE Performed at [facility], [address], [place], [place] [address]    Culture   Final    NO GROWTH Performed at [facility], [address] [place], [place] [address]    Report Status 04/23/2023 FINAL  Final  Culture, blood (Routine X 2) w Reflex to ID Panel     Status: Abnormal (Preliminary result)   Collection Time: 04/23/23  6:10 AM   Specimen: BLOOD  Result Value Ref Range Status   Specimen Description   Final    BLOOD  LEFT HAND Performed at [facility], [address], [place], [place] [address]    Special Requests   Final    BOTTLES DRAWN AEROBIC ONLY Blood Culture results may not be optimal due to an inadequate volume of blood received in culture bottles Performed at [facility], [address] [place], [place] [address]    Culture  Setup Time   Final    GRAM POSITIVE COCCI AEROBIC BOTTLE ONLY CRITICAL RESULT CALLED TO, READ BACK BY AND VERIFIED WITH: DEVON MITCHELL 04/24/2023 1422 CP Performed at [facility], [address] [place], [place] [address]    Culture (A)  Final    STAPHYLOCOCCUS AURICULARIS SUSCEPTIBILITIES TO FOLLOW Performed at [facility], [address], [place], [place] [address]    Report Status PENDING  Incomplete  Culture, blood (Routine X 2) w Reflex to ID Panel     Status: None   Collection Time: 04/23/23  6:21 AM   Specimen: BLOOD  Result Value Ref Range Status   Specimen Description BLOOD  RIGHT HAND  Final   Special Requests   Final    BOTTLES DRAWN AEROBIC AND ANAEROBIC Blood Culture adequate volume   Culture   Final    NO GROWTH 5 DAYS Performed at [facility], [address] [place], [place] [address]    Report Status 04/28/2023 FINAL  Final  Culture, blood (Routine X 2) w Reflex to ID Panel     Status: None (Preliminary result)   Collection Time: 04/28/23  7:05 AM  Specimen: BLOOD  Result Value Ref Range Status   Specimen Description BLOOD  RIGHT ARM  Final   Special Requests   Final    BOTTLES DRAWN AEROBIC AND ANAEROBIC Blood Culture adequate volume   Culture   Final    NO GROWTH 1 DAY Performed at [facility], [address] [place], [place] [address]    Report Status PENDING  Incomplete  Culture, blood (Routine X 2) w Reflex to ID Panel     Status: None (Preliminary result)   Collection Time: 04/28/23  7:05 AM   Specimen: BLOOD  Result Value Ref Range Status   Specimen Description BLOOD  LEFT AC  Final   Special Requests   Final    BOTTLES DRAWN  AEROBIC AND ANAEROBIC Blood Culture results may not be optimal due to an excessive volume of blood received in culture bottles   Culture  Setup Time PENDING  Incomplete   Culture   Final    NO GROWTH 1 DAY Performed at [facility], [address], [place], [place] [address]    Report Status PENDING  Incomplete    Coagulation Studies: No results for input(s): "LABPROT", "INR" in the last 72 hours.  Urinalysis: No results for input(s): "COLORURINE", "LABSPEC", "PHURINE", "GLUCOSEU", "HGBUR", "BILIRUBINUR", "KETONESUR", "PROTEINUR", "UROBILINOGEN", "NITRITE", "LEUKOCYTESUR" in the last 72 hours.  Invalid input(s): "APPERANCEUR"    Imaging: No results found.   Medications:    albumin human 12.5 g (04/29/23 0930)    vitamin C  500 mg Oral BID   Chlorhexidine Gluconate Cloth  6 each Topical Daily   copper  8 mg Oral Daily   feeding supplement (KATE FARMS STANDARD 1.4)  325 mL Oral TID BM   folic acid  1 mg Oral Daily   heparin injection (subcutaneous)  5,000 Units Subcutaneous Q8H   hydrocortisone sod succinate (SOLU-CORTEF) inj  100 mg Intravenous Daily   insulin aspart  0-15 Units Subcutaneous TID WC   lactase  6,000 Units Oral TID WC   levothyroxine  100 mcg Oral Q0600   lipase/protease/amylase  12,000 Units Oral TID AC   midodrine  10 mg Oral TID WC   mirtazapine  7.5 mg Oral QHS   multivitamin  15 mL Oral Daily   pantoprazole (PROTONIX) IV  40 mg Intravenous Q24H   polyethylene glycol  17 g Oral Daily   rifaximin  550 mg Oral TID   thiamine  100 mg Oral Daily   vancomycin variable dose per unstable renal function (pharmacist dosing)   Does not apply See admin instructions   vitamin A  10,000 Units Oral Daily   Vitamin D (Ergocalciferol)  50,000 Units Oral Q7 days   vitamin E  400 Units Oral Daily   zinc sulfate  220 mg Oral Daily   acetaminophen, bismuth subsalicylate, loperamide, polyethylene glycol  Assessment/ Plan:  47 y.o. male with super morbid  obesity, hypertension, obstructive sleep apnea, lymphedema, history of bariatric surgery, cholelithiasis, severe hypothyroidism, bedridden status, history of IVC filter placement admitted on 04/21/2023 for Severe hypothyroidism [E03.9] Generalized weakness [R53.1] Lower limb ulcer, ankle, right, with unspecified severity (HCC) [L97.319] Hypothyroidism, unspecified type [E03.9] Sepsis, due to unspecified organism, unspecified whether acute organ dysfunction present (HCC) [A41.9]   Acute kidney injury Likely secondary to hemodynamic alterations caused by septic shock and hypotension and possibly Vancomycin toxicity leading to ATN. Patient is nonoliguric. Continues to make some urine, not adequate amount. Renal function continues to slowly increase.  Generalized edema Patient  has hypoalbuminemia and malnutrition. Completed three days of IV albumin. Will continue IV albumin 12.5g daily. Consuming protein supplementation   Hyponatremia Likely secondary to AKI and generalized volume overload. Sodium 130  Hypotension, received pressor support in ICU.  Continue midodrine 10mg  TID.  Chronic systolic CHF LVEF 35 to 40%, moderately decreased function, global hypokinesis, grade 1 diastolic dysfunction, normal right ventricular systolic function from echo 04/22/2023.    LOS: 8 [facility] 5/1/202411:32 AM  [facility] [place], [place] [phone]

## 2023-04-29 NOTE — Progress Notes (Signed)
PROGRESS NOTE    Christopher Herman  ZOX:[id number] DOB: 1976-05-21 DOA: 04/21/2023 PCP: Pcp, No    Brief Narrative:  47 y.o. male with a PMH significant for severe obesity s/p bariatric surgery, OSA, hypoventilation syndrome, HTN, GERD, cholelithiasis, bilateral extremity lipoma, chronic lymphedema status post IVC filter placement, IDA.   They presented from home to the ED on 04/21/2023 with generalized weakness, extreme fatigue, unintended weight loss, disuse of upper extremities and poor p.o. intake  x a few days. He is chronically bed bound and malnourished.    In the ED, it was found that they had BP 100/73  Pulse (!) 110  Temp 98.2 F (36.8 C) (Oral)  Resp 15  Ht 5\' 5"  (1.651 m)  Wt (!) 185.4 kg  SpO2 100%  BMI 68.02 kg/m .  Significant findings included WBC 27.5, hgb 9.5, platelet 249, Cr 0.74, Na+ 130, K+ 3.2. SARS-CoV-2 PCR negative, Influenza PCR> negative. Blood and urine cultures pending.  Chest Xray> no active cardiopulmonary process    They were initially treated with vancomycin, cefepime, metronidazole.  They were admitted to the ICU for septic shock with hypotension resistant to bolus and required vasopressors.    4/26: consulted nephrology for concerns of worsening renal function and electrolyte abnormalities. Restarted IVF for renal support and hypotension.    4/27:renal function continues to worsen. Vanc trough elevated so not receiving any more Abx currently.  4/28:again Cr continues to rise despite continued fluids and addition of albuterol. Vanc trough remains remarkably high. Consulted CCM as fluids need to be discontinued and having hypotension/tachycardia while on midodrine. May need to resume pressors. Still shock picture.  4/29:hgb decreasing and remains hypotensive despite albumin and midodrine. He is fatigued. No bleeding but likely becoming more anemic due to worsening kidney function. Spoke with patient and his mother on the phone this morning and we are  going to transfuse a unit of RBC today for hemodynamic support. Cr continues to rise.    04/28/23 - Despite blood transfusion and good response yesterday, not having improved fatigue. Blood pressures remain soft with mild improvement. MAP ~70. Seen by palliative with no change in management.   5/1: Remains on midodrine.  Maps appears slightly improved.  Palliative care and RD evaluated patient today.  Patient concerned about persistent diarrhea and is hesitant to eat.  Adjustments made per RD recommendations.  Psychiatry consulted as patient endorsed feeling depressed.   Assessment & Plan:   Principal Problem:   Severe hypothyroidism Active Problems:   Malnutrition of moderate degree (HCC)   Generalized weakness   Ulcer of right ankle (HCC)   Hypotension due to hypovolemia   Acute renal failure due to tubular necrosis (HCC)   Hypothyroidism  SEPTIC shock SOURCE-UTI Patient has multiple sources of infection including urine and bilateral lower extremity cellulitis.  Blood pressures remain adequate but somewhat borderline. Plan: Continue vancomycin with pharmacy dosing assistance Follow-up repeat blood cultures IV albumin bolus as needed Daily labs  Severe Hypothyroidism- Last TSH, Free T4 check was 11 years ago with normal TSH TSH: 9.134 Free T4:1.34 Am cortisol 4/23 WNL -s/p IV Synthroid 250 mcg followed by 100 mcg daily. Repeat TSH on 4/25 is 1.393 Plan: Continue Synthroid 100 mcg daily Continue hydrocortisone 100 mg daily, slowly wean   Acute renal failure- Cr 1.06>1.27>1.3>1.4>1.49 today, up from baseline of about 0.6 Hyponatremia- 130>126>128>>129.  hypokalemia- K+ 3.3>>4.5>4.4 s/p repletion with IVF.  Renal US neg for hydronephrosis or obstruction. Foley removed 4/26.  Urine output remains  adequate Plan: Nephrology consulted.  Currently no indication for inpatient hemodialysis.  Creatinine slowly worsening.  Encourage p.o. intake.   Thrombocytosis  Significant  leukocytosis and rising despite supportive care and antibiotics. WBC 27.5>32.8>44.4>32.4>31.3> 21.2. diff showing abs neutrophil predominance. Platelets [phone]. Morphology unremarkable on pathology report. Plan: Will continue treatment for infection as above.  Leukocytosis downtrending.  Daily labs.   Anemia of chronic disease-transfused 1u pRBCs 4/29.  Monitor and replace as necessary.  Maintain hemoglobin greater than 8   Morbid obesity  malnutrition s/p bariatric surgery  chronic diarrhea Patient has had issues with chronic diarrhea.  Discussed with RD at length.  Will initiate several agents to encourage p.o. intake and hopefully slow the volume of diarrhea.   Sinus tachycardia  prolonged Qtc- 501. HR 110s and ECG showing sinus. Asymptomatic - avoid Qtc prolonging agents   Bed-bound - PT/OT evaluation and therapy - frequent turning  Depression Patient endorsed to both me as well as palliative care feeling depressed.  This is likely impacting his oral intake and unfortunately delaying wound healing.  Initiated Remeron per RD recommendations.  Official consult to psychiatry placed 5/1.  DVT prophylaxis: SQ heparin Code Status: Full Family Communication: None Disposition Plan: Status is: Inpatient Remains inpatient appropriate because: Resolving septic shock   Level of care: Stepdown  Consultants:  Nephrology Psychiatry Palliative care ID  Procedures:  None  Antimicrobials: Vancomycin   Subjective: Seen and examined.  Flattened affect.  Endorses appreciation that white count trending down.  No specific complaints.  Objective: Vitals:   04/29/23 1031 04/29/23 1200 04/29/23 1300 04/29/23 1400  BP: (!) 89/71 (!) 88/59 (!) 131/113 99/82  Pulse: (!) 112 (!) 114 89 96  Resp: 13 17 12 11   Temp:      TempSrc:      SpO2: 100% 100% 99% 100%  Weight:      Height:        Intake/Output Summary (Last 24 hours) at 04/29/2023 1449 Last data filed at 04/29/2023  1400 Gross per 24 hour  Intake 501 ml  Output 250 ml  Net 251 ml   Filed Weights   04/26/23 1336 04/28/23 0321 04/29/23 0500  Weight: (!) 171.3 kg (!) 172.2 kg (!) 169 kg    Examination:  General exam: Appears weak and fatigued.  Appears chronically ill Respiratory system: Lung sounds diminished at bases.  Normal work of breathing.  2 L Cardiovascular system: S1-S2, RRR, no murmurs, marked chronic lymphedema BLE Gastrointestinal system: Obese, NT/ND, normal bowel sounds Central nervous system: Alert and oriented. No focal neurological deficits. Extremities: Markedly decreased power bilateral upper and lower extremities Skin: No rashes, lesions or ulcers Psychiatry: Judgement and insight appear impaired. Mood & affect flattened.     Data Reviewed: I have personally reviewed following labs and imaging studies  CBC: Recent Labs  Lab 04/25/23 0540 04/26/23 0140 04/26/23 0455 04/27/23 0623 04/27/23 1831 04/28/23 0703 04/29/23 0602  WBC 33.2*   < > 31.3* 27.1* 25.3* 30.0* 21.2*  NEUTROABS 29.1*  --  24.9* 21.0*  --  24.2* 16.9*  HGB 8.2*   < > 7.5* 7.2* 8.7* 9.1* 8.5*  HCT 25.8*   < > 23.5* 22.2* 27.1* 28.4* 25.8*  MCV 85.4   < > 84.5 85.1 85.0 84.0 82.7  PLT 514*   < > 462* 440* 369 501* 400   < > = values in this interval not displayed.   Basic Metabolic Panel: Recent Labs  Lab 04/23/23 0535 04/24/23 0430 04/25/23 0540 04/26/23  0140 04/26/23 0455 04/27/23 0623 04/27/23 0907 04/28/23 0703 04/29/23 0602  NA 130* 126* 128* 131* 128*  --  129* 129* 130*  K 3.3* 3.2* 4.0 4.6 4.8  --  4.5 4.4 4.0  CL 98 96* 101 103 103  --  102 103 105  CO2 20* 20* 16* 16* 15*  --  16* 15* 17*  GLUCOSE 102* 116* 114* 91 94  --  95 99 100*  BUN 31* 36* 37* 37* 37*  --  37* 40* 44*  CREATININE 0.89 1.06 1.27* 1.36* 1.33*  --  1.40* 1.49* 1.56*  CALCIUM 7.8* 7.5* 7.7* 8.0* 7.8*  --  8.0* 8.2* 8.1*  MG 2.0 2.0  --   --   --   --   --   --   --   PHOS 2.8 2.9 3.0  --  2.7 2.7  --   3.1  3.0 3.1   GFR: Estimated Creatinine Clearance: 88.4 mL/min (A) (by C-G formula based on SCr of 1.56 mg/dL (H)). Liver Function Tests: Recent Labs  Lab 04/26/23 0140 04/27/23 0907 04/28/23 0703 04/29/23 0602  AST 14* 16  --   --   ALT 17 19  --   --   ALKPHOS 99 104  --   --   BILITOT 1.0 1.1  --   --   PROT 5.0* 5.2*  --   --   ALBUMIN 2.1* 2.4* 2.6* 2.6*   No results for input(s): "LIPASE", "AMYLASE" in the last 168 hours. No results for input(s): "AMMONIA" in the last 168 hours. Coagulation Profile: No results for input(s): "INR", "PROTIME" in the last 168 hours. Cardiac Enzymes: No results for input(s): "CKTOTAL", "CKMB", "CKMBINDEX", "TROPONINI" in the last 168 hours. BNP (last 3 results) No results for input(s): "PROBNP" in the last 8760 hours. HbA1C: No results for input(s): "HGBA1C" in the last 72 hours. CBG: Recent Labs  Lab 04/28/23 0723 04/28/23 1225 04/28/23 1632 04/29/23 0733 04/29/23 1124  GLUCAP 101* 108* 117* 100* 92   Lipid Profile: No results for input(s): "CHOL", "HDL", "LDLCALC", "TRIG", "CHOLHDL", "LDLDIRECT" in the last 72 hours. Thyroid Function Tests: No results for input(s): "TSH", "T4TOTAL", "FREET4", "T3FREE", "THYROIDAB" in the last 72 hours. Anemia Panel: No results for input(s): "VITAMINB12", "FOLATE", "FERRITIN", "TIBC", "IRON", "RETICCTPCT" in the last 72 hours. Sepsis Labs: No results for input(s): "PROCALCITON", "LATICACIDVEN" in the last 168 hours.  Recent Results (from the past 240 hour(s))  Blood Culture (routine x 2)     Status: Abnormal   Collection Time: 04/21/23  2:02 AM   Specimen: BLOOD  Result Value Ref Range Status   Specimen Description   Final    BLOOD RIGHT ARM Performed at [facility], [address] [place], [place] [address]    Special Requests   Final    BOTTLES DRAWN AEROBIC AND ANAEROBIC Blood Culture adequate volume Performed at [facility], [address] [place], [place]  [address]    Culture  Setup Time   Final    GRAM POSITIVE COCCI IN BOTH AEROBIC AND ANAEROBIC BOTTLES CRITICAL VALUE NOTED.  VALUE IS CONSISTENT WITH PREVIOUSLY REPORTED AND CALLED VALUE. Performed at [facility], [address] [place], [place] [address]    Culture (A)  Final    STAPHYLOCOCCUS CAPITIS STAPHYLOCOCCUS AURICULARIS    Report Status 04/26/2023 FINAL  Final   Organism ID, Bacteria STAPHYLOCOCCUS CAPITIS  Final   Organism ID, Bacteria STAPHYLOCOCCUS AURICULARIS  Final  Susceptibility   Staphylococcus auricularis - MIC*    CIPROFLOXACIN 4 RESISTANT Resistant     ERYTHROMYCIN >=8 RESISTANT Resistant     GENTAMICIN <=0.5 SENSITIVE Sensitive     OXACILLIN >=4 RESISTANT Resistant     TETRACYCLINE <=1 SENSITIVE Sensitive     VANCOMYCIN <=0.5 SENSITIVE Sensitive     TRIMETH/SULFA <=10 SENSITIVE Sensitive     CLINDAMYCIN RESISTANT Resistant     RIFAMPIN <=0.5 SENSITIVE Sensitive     Inducible Clindamycin POSITIVE Resistant     * STAPHYLOCOCCUS AURICULARIS   Staphylococcus capitis - MIC*    CIPROFLOXACIN 4 RESISTANT Resistant     ERYTHROMYCIN >=8 RESISTANT Resistant     GENTAMICIN <=0.5 SENSITIVE Sensitive     OXACILLIN >=4 RESISTANT Resistant     TETRACYCLINE >=16 RESISTANT Resistant     VANCOMYCIN <=0.5 SENSITIVE Sensitive     TRIMETH/SULFA <=10 SENSITIVE Sensitive     CLINDAMYCIN INTERMEDIATE Intermediate     RIFAMPIN 1 SENSITIVE Sensitive     Inducible Clindamycin NEGATIVE Sensitive     * STAPHYLOCOCCUS CAPITIS  SARS Coronavirus 2 by RT PCR (hospital order, performed in [facility] hospital lab) *cepheid single result test* Anterior Nasal Swab     Status: None   Collection Time: 04/21/23  2:18 AM   Specimen: Anterior Nasal Swab  Result Value Ref Range Status   SARS Coronavirus 2 by RT PCR NEGATIVE NEGATIVE Final    Comment: (NOTE) SARS-CoV-2 target nucleic acids are NOT DETECTED.  The SARS-CoV-2 RNA is generally detectable in upper and  lower respiratory specimens during the acute phase of infection. The lowest concentration of SARS-CoV-2 viral copies this assay can detect is 250 copies / mL. A negative result does not preclude SARS-CoV-2 infection and should not be used as the sole basis for treatment or other patient management decisions.  A negative result may occur with improper specimen collection / handling, submission of specimen other than nasopharyngeal swab, presence of viral mutation(s) within the areas targeted by this assay, and inadequate number of viral copies (<250 copies / mL). A negative result must be combined with clinical observations, patient history, and epidemiological information.  Fact Sheet for Patients:   RoadLapTop.co.za  Fact Sheet for Healthcare Providers: http://kim-miller.com/  This test is not yet approved or  cleared by the [place] FDA and has been authorized for detection and/or diagnosis of SARS-CoV-2 by FDA under an Emergency Use Authorization (EUA).  This EUA will remain in effect (meaning this test can be used) for the duration of the COVID-19 declaration under Section 564(b)(1) of the Act, 21 U.S.C. section 360bbb-3(b)(1), unless the authorization is terminated or revoked sooner.  Performed at [facility], [address] [place], [place] [address]   Blood Culture (routine x 2)     Status: Abnormal   Collection Time: 04/21/23  2:36 AM   Specimen: BLOOD  Result Value Ref Range Status   Specimen Description   Final    BLOOD  LEFT [facility] Performed at [facility], [address] [place], [place] [address]    Special Requests   Final    BOTTLES DRAWN AEROBIC AND ANAEROBIC Blood Culture adequate volume Performed at [facility], [address] [place], [place] [address]    Culture  Setup Time   Final    GRAM POSITIVE COCCI AEROBIC BOTTLE ONLY CRITICAL RESULT CALLED TO, READ BACK BY AND  VERIFIED WITH: JASON ROBINS AT 2213 ON 04/21/23 BY SS    Culture (A)  Final    STAPHYLOCOCCUS CAPITIS UNABLE TO RECOVER SEPI IN CULTURE Performed at [facility], [address], [place], [place] [address]    Report Status 04/26/2023 FINAL  Final   Organism ID, Bacteria STAPHYLOCOCCUS CAPITIS  Final      Susceptibility   Staphylococcus capitis - MIC*    CIPROFLOXACIN <=0.5 SENSITIVE Sensitive     ERYTHROMYCIN >=8 RESISTANT Resistant     GENTAMICIN <=0.5 SENSITIVE Sensitive     OXACILLIN <=0.25 SENSITIVE Sensitive     TETRACYCLINE >=16 RESISTANT Resistant     VANCOMYCIN <=0.5 SENSITIVE Sensitive     TRIMETH/SULFA <=10 SENSITIVE Sensitive     CLINDAMYCIN RESISTANT Resistant     RIFAMPIN <=0.5 SENSITIVE Sensitive     Inducible Clindamycin POSITIVE Resistant     * STAPHYLOCOCCUS CAPITIS  Blood Culture ID Panel (Reflexed)     Status: Abnormal   Collection Time: 04/21/23  2:36 AM  Result Value Ref Range Status   Enterococcus faecalis NOT DETECTED NOT DETECTED Final   Enterococcus Faecium NOT DETECTED NOT DETECTED Final   Listeria monocytogenes NOT DETECTED NOT DETECTED Final   Staphylococcus species DETECTED (A) NOT DETECTED Final    Comment: CRITICAL RESULT CALLED TO, READ BACK BY AND VERIFIED WITH: JASON ROBINS AT 2213 ON 04/21/23 BY SS    Staphylococcus aureus (BCID) NOT DETECTED NOT DETECTED Final   Staphylococcus epidermidis DETECTED (A) NOT DETECTED Final    Comment: Methicillin (oxacillin) resistant coagulase negative staphylococcus. Possible blood culture contaminant (unless isolated from more than one blood culture draw or clinical case suggests pathogenicity). No antibiotic treatment is indicated for blood  culture contaminants. CRITICAL RESULT CALLED TO, READ BACK BY AND VERIFIED WITH: JASON ROBINS AT 2213 ON 04/21/23 BY SS    Staphylococcus lugdunensis NOT DETECTED NOT DETECTED Final   Streptococcus species NOT DETECTED NOT DETECTED Final   Streptococcus agalactiae  NOT DETECTED NOT DETECTED Final   Streptococcus pneumoniae NOT DETECTED NOT DETECTED Final   Streptococcus pyogenes NOT DETECTED NOT DETECTED Final   A.calcoaceticus-baumannii NOT DETECTED NOT DETECTED Final   Bacteroides fragilis NOT DETECTED NOT DETECTED Final   Enterobacterales NOT DETECTED NOT DETECTED Final   Enterobacter cloacae complex NOT DETECTED NOT DETECTED Final   Escherichia coli NOT DETECTED NOT DETECTED Final   Klebsiella aerogenes NOT DETECTED NOT DETECTED Final   Klebsiella oxytoca NOT DETECTED NOT DETECTED Final   Klebsiella pneumoniae NOT DETECTED NOT DETECTED Final   Proteus species NOT DETECTED NOT DETECTED Final   Salmonella species NOT DETECTED NOT DETECTED Final   Serratia marcescens NOT DETECTED NOT DETECTED Final   Haemophilus influenzae NOT DETECTED NOT DETECTED Final   Neisseria meningitidis NOT DETECTED NOT DETECTED Final   Pseudomonas aeruginosa NOT DETECTED NOT DETECTED Final   Stenotrophomonas maltophilia NOT DETECTED NOT DETECTED Final   Candida albicans NOT DETECTED NOT DETECTED Final   Candida auris NOT DETECTED NOT DETECTED Final   Candida glabrata NOT DETECTED NOT DETECTED Final   Candida krusei NOT DETECTED NOT DETECTED Final   Candida parapsilosis NOT DETECTED NOT DETECTED Final   Candida tropicalis NOT DETECTED NOT DETECTED Final   Cryptococcus neoformans/gattii NOT DETECTED NOT DETECTED Final   Methicillin resistance mecA/C DETECTED (A) NOT DETECTED Final    Comment: CRITICAL RESULT CALLED TO, READ BACK BY AND VERIFIED WITH: JASON ROBINS AT 2213 ON 04/21/23 BY SS Performed at [facility], [address] [place], [place] [address]   Urine Culture (for pregnant, neutropenic or urologic patients or patients  with an indwelling urinary catheter)     Status: None   Collection Time: 04/22/23  5:22 PM   Specimen: Urine, Clean Catch  Result Value Ref Range Status   Specimen Description   Final    URINE, CLEAN CATCH Performed at [facility], [address], [place], [place] [address]    Special Requests   Final    NONE Performed at [facility], [address], [place], [place] [address]    Culture   Final    NO GROWTH Performed at [facility], [address] [place], [place] [address]    Report Status 04/23/2023 FINAL  Final  Culture, blood (Routine X 2) w Reflex to ID Panel     Status: Abnormal (Preliminary result)   Collection Time: 04/23/23  6:10 AM   Specimen: BLOOD  Result Value Ref Range Status   Specimen Description   Final    BLOOD  LEFT HAND Performed at [facility], [address] [place], [place] [address]    Special Requests   Final    BOTTLES DRAWN AEROBIC ONLY Blood Culture results may not be optimal due to an inadequate volume of blood received in culture bottles Performed at [facility], [address] [place], [place] [address]    Culture  Setup Time   Final    GRAM POSITIVE COCCI AEROBIC BOTTLE ONLY CRITICAL RESULT CALLED TO, READ BACK BY AND VERIFIED WITH: DEVON MITCHELL 04/24/2023 1422 CP Performed at [facility], [address] [place], [place] [address]    Culture (A)  Final    STAPHYLOCOCCUS AURICULARIS SUSCEPTIBILITIES TO FOLLOW Performed at [facility], [address], [place], [place] [address]    Report Status PENDING  Incomplete  Culture, blood (Routine X 2) w Reflex to ID Panel     Status: None   Collection Time: 04/23/23  6:21 AM   Specimen: BLOOD  Result Value Ref Range Status   Specimen Description BLOOD  RIGHT HAND  Final   Special Requests   Final    BOTTLES DRAWN AEROBIC AND ANAEROBIC Blood Culture adequate volume   Culture   Final    NO GROWTH 5 DAYS Performed at [facility], [address] [place], [place] [address]    Report Status 04/28/2023 FINAL  Final  Culture, blood (Routine X 2) w Reflex to ID Panel     Status: None (Preliminary result)   Collection Time: 04/28/23  7:05 AM    Specimen: BLOOD  Result Value Ref Range Status   Specimen Description BLOOD  RIGHT ARM  Final   Special Requests   Final    BOTTLES DRAWN AEROBIC AND ANAEROBIC Blood Culture adequate volume   Culture   Final    NO GROWTH 1 DAY Performed at [facility], [address], [place], [place] [address]    Report Status PENDING  Incomplete  Culture, blood (Routine X 2) w Reflex to ID Panel     Status: None (Preliminary result)   Collection Time: 04/28/23  7:05 AM   Specimen: BLOOD  Result Value Ref Range Status   Specimen Description BLOOD  LEFT AC  Final   Special Requests   Final    BOTTLES DRAWN AEROBIC AND ANAEROBIC Blood Culture results may not be optimal due to an excessive volume of blood received in culture bottles   Culture  Setup Time PENDING  Incomplete   Culture   Final  NO GROWTH 1 DAY Performed at [facility], [address], [place], [place] [address]    Report Status PENDING  Incomplete         Radiology Studies: No results found.      Scheduled Meds:  vitamin C  500 mg Oral BID   Chlorhexidine Gluconate Cloth  6 each Topical Daily   copper  8 mg Oral Daily   feeding supplement (KATE [employer]ARMS STANDARD 1.4)  325 mL Oral TID BM   folic acid  1 mg Oral Daily   heparin injection (subcutaneous)  5,000 Units Subcutaneous Q8H   hydrocortisone sod succinate (SOLU-CORTEF) inj  100 mg Intravenous Daily   insulin aspart  0-15 Units Subcutaneous TID WC   lactase  6,000 Units Oral TID WC   levothyroxine  100 mcg Oral Q0600   lipase/protease/amylase  24,000 Units Oral TID AC   midodrine  10 mg Oral TID WC   mirtazapine  7.5 mg Oral QHS   multivitamin  15 mL Oral Daily   pantoprazole (PROTONIX) IV  40 mg Intravenous Q24H   rifaximin  550 mg Oral TID   thiamine  100 mg Oral Daily   vancomycin variable dose per unstable renal function (pharmacist dosing)   Does not apply See admin instructions   vitamin A  10,000 Units Oral Daily   Vitamin D  (Ergocalciferol)  50,000 Units Oral Q7 days   vitamin E  400 Units Oral Daily   zinc sulfate  220 mg Oral Daily   Continuous Infusions:  albumin human 12.5 g (04/29/23 0930)     LOS: 8 days   Tresa Moore, MD [facility]   If 7PM-7AM, please contact night-coverage  04/29/2023, 2:49 PM

## 2023-04-29 NOTE — Progress Notes (Addendum)
Pharmacy Antibiotic Note  Christopher Herman is a 47 y.o. male admitted on 04/21/2023 with sepsis.  Pharmacy has been consulted for Vancomycin and piperacillin/tazobactam dosing. Patient presents with fatigue, decreased appetite and weight loss.    Today, 04/29/2023 Day #8 antibiotics - currently on vancomycin  Renal - SCr 1.56 mg/dl -  trending up - suspect has decreased muscle mass thus low creatinine UOP decreased WBC 21.2- chronic leukocytosis since Feb 2023 Afebrile BMI ~65 4/23 blood cultures: 3/4 bottles LAC - oxacillin-susc S capitis R arm - oxacillin-resistant S capitis, oxacillin-resitant S auricularis.  4/25 blood cultures: 1/3 bottles S auricularis (susc pending) ID consulted 4/24 4/30 blood cultures: NGTD Received vancomycin IV 4/23 0254 1000mg  4/23 0444 1500mg   Vancomycin levels: Dose - vancomycin 1750mg  IV q12h (dose prior to level given 4/25 at 22:12) Vancomycin trough (prior to 4th maintenance dose) 4/26 at 11:10 = > 60 mcg/mL Vancomycin 4/26 12p dose only infused from (or 365mg  administered) Random vancomycin level 4/27 at 0540 > 60 mcg/mL Random vancomycin elvel 4/28 at 0642 > 60 mcg/mL Random vancomycin level 4/29 at 0623 = 53 mcg/mL Random vancomycin level 5/1 at 0602 = 45 mcg/mL  Plan: Vancomycin level remains supratherapeutic today at 45 mcg/ml with slow elimination - much slower than expected.  One explanation is patient has low muscle mass thus low creatinine and low predictability of SCr to accurately measure renal function  Blood cultures looks suspicious for contaminants,  no plans for further vancomycin (receiving therapy from sustained elevated levels) but will follow ID recommendations. Following 4/25 susceptibilities to return and 4/30 repeat blood cxs.    Monitor renal function Recheck random vancomycin level 5/3 ID consulted   Height: 5\' 5"  (165.1 cm) Weight: (!) 169 kg (372 lb 9.2 oz) IBW/kg (Calculated) : 61.5  Temp (24hrs), Avg:97.8 F  (36.6 C), Min:97.5 F (36.4 C), Max:98.3 F (36.8 C)  Recent Labs  Lab 04/24/23 1110 04/25/23 0540 04/26/23 0140 04/26/23 0455 04/26/23 0981 04/27/23 0623 04/27/23 0907 04/27/23 1831 04/28/23 0703 04/29/23 0602  WBC  --    < > 29.1* 31.3*  --  27.1*  --  25.3* 30.0* 21.2*  CREATININE  --    < > 1.36* 1.33*  --   --  1.40*  --  1.49* 1.56*  VANCOTROUGH >60*  --   --   --   --   --   --   --   --   --   VANCORANDOM  --    < >  --   --    < > 53*  --   --   --  45   < > = values in this interval not displayed.     Estimated Creatinine Clearance: 88.4 mL/min (A) (by C-G formula based on SCr of 1.56 mg/dL (H)).    No Known Allergies  Antimicrobials this admission: Vanc 4/23 >> 4/23, 4/24 >> Ceftriaxone 4/24 >> Linezolid 4/23 >>4/23 Zosyn 4/23 >>4/24   Thank you for allowing pharmacy to be a part of this patient's care.  Juliette Alcide, PharmD, BCPS, BCIDP Work Cell: [phone] 04/29/2023 9:04 AM

## 2023-04-30 DIAGNOSIS — M6281 Muscle weakness (generalized): Secondary | ICD-10-CM | POA: Diagnosis not present

## 2023-04-30 DIAGNOSIS — E039 Hypothyroidism, unspecified: Secondary | ICD-10-CM | POA: Diagnosis not present

## 2023-04-30 LAB — CULTURE, BLOOD (ROUTINE X 2): Culture: NO GROWTH

## 2023-04-30 LAB — CBC WITH DIFFERENTIAL/PLATELET
Abs Immature Granulocytes: 0.3 10*3/uL — ABNORMAL HIGH (ref 0.00–0.07)
Basophils Absolute: 0 10*3/uL (ref 0.0–0.1)
Basophils Relative: 0 %
Eosinophils Absolute: 0.1 10*3/uL (ref 0.0–0.5)
Eosinophils Relative: 1 %
HCT: 26.5 % — ABNORMAL LOW (ref 39.0–52.0)
Hemoglobin: 8.8 g/dL — ABNORMAL LOW (ref 13.0–17.0)
Immature Granulocytes: 1 %
Lymphocytes Relative: 9 %
Lymphs Abs: 2.1 10*3/uL (ref 0.7–4.0)
MCH: 27.4 pg (ref 26.0–34.0)
MCHC: 33.2 g/dL (ref 30.0–36.0)
MCV: 82.6 fL (ref 80.0–100.0)
Monocytes Absolute: 1.4 10*3/uL — ABNORMAL HIGH (ref 0.1–1.0)
Monocytes Relative: 6 %
Neutro Abs: 20.2 10*3/uL — ABNORMAL HIGH (ref 1.7–7.7)
Neutrophils Relative %: 83 %
Platelets: 393 10*3/uL (ref 150–400)
RBC: 3.21 MIL/uL — ABNORMAL LOW (ref 4.22–5.81)
RDW: 20.8 % — ABNORMAL HIGH (ref 11.5–15.5)
WBC: 24.1 10*3/uL — ABNORMAL HIGH (ref 4.0–10.5)
nRBC: 0 % (ref 0.0–0.2)

## 2023-04-30 LAB — GASTROINTESTINAL PANEL BY PCR, STOOL (REPLACES STOOL CULTURE)

## 2023-04-30 LAB — RENAL FUNCTION PANEL
Albumin: 2.7 g/dL — ABNORMAL LOW (ref 3.5–5.0)
Anion gap: 11 (ref 5–15)
BUN: 46 mg/dL — ABNORMAL HIGH (ref 6–20)
CO2: 16 mmol/L — ABNORMAL LOW (ref 22–32)
Calcium: 8.2 mg/dL — ABNORMAL LOW (ref 8.9–10.3)
Chloride: 103 mmol/L (ref 98–111)
Creatinine, Ser: 1.61 mg/dL — ABNORMAL HIGH (ref 0.61–1.24)
GFR, Estimated: 53 mL/min — ABNORMAL LOW (ref >60)
Glucose, Bld: 94 mg/dL (ref 70–99)
Phosphorus: 3.2 mg/dL (ref 2.5–4.6)
Potassium: 3.6 mmol/L (ref 3.5–5.1)
Sodium: 130 mmol/L — ABNORMAL LOW (ref 135–145)

## 2023-04-30 LAB — GLUCOSE, CAPILLARY
Glucose-Capillary: 95 mg/dL (ref 70–99)
Glucose-Capillary: 113 mg/dL — ABNORMAL HIGH (ref 70–99)
Glucose-Capillary: 134 mg/dL — ABNORMAL HIGH (ref 70–99)

## 2023-04-30 MED ORDER — PROSOURCE TF20 ENFIT COMPATIBL EN LIQD
60.0000 mL | Freq: Four times a day (QID) | ENTERAL | Status: DC
  Administered 2023-04-30 – 2023-05-06 (×5): 60 mL
  Filled 2023-04-30 (×26): qty 60

## 2023-04-30 MED ORDER — MIRTAZAPINE 15 MG PO TABS
15.0000 mg | ORAL_TABLET | Freq: Every day | ORAL | Status: DC
  Administered 2023-04-30 – 2023-05-22 (×23): 15 mg via ORAL
  Filled 2023-04-30 (×23): qty 1

## 2023-04-30 NOTE — Progress Notes (Signed)
Report called to RN on 2A, pt's mother notified of pt's transfer, new room number provided to the mother per pt request.

## 2023-04-30 NOTE — Progress Notes (Signed)
[facility], [place] 04/30/23  Subjective:   Hospital day # 9  Patient seen and evaluated in ICU Sodium 130 Patient has pure wick, inadequate urine output Weeping skin Generalized edema present   Renal: 05/01 0701 - 05/02 0700 In: 320 [P.O.:260; IV Piggyback:60] Out: 475 [Urine:475] Lab Results  Component Value Date   CREATININE 1.61 (H) 04/30/2023   CREATININE 1.56 (H) 04/29/2023   CREATININE 1.49 (H) 04/28/2023     Objective:  Vital signs in last 24 hours:  Temp:  [97.3 F (36.3 C)-98.3 F (36.8 C)] 98.3 F (36.8 C) (05/02 0800) Pulse Rate:  [85-132] 132 (05/02 0800) Resp:  [10-17] 14 (05/02 0800) BP: (88-152)/(40-139) 91/66 (05/02 0800) SpO2:  [99 %-100 %] 100 % (05/02 0800) Weight:  [169.4 kg] 169.4 kg (05/02 0500)  Weight change: 0.4 kg Filed Weights   04/28/23 0321 04/29/23 0500 04/30/23 0500  Weight: (!) 172.2 kg (!) 169 kg (!) 169.4 kg    Intake/Output:    Intake/Output Summary (Last 24 hours) at 04/30/2023 1044 Last data filed at 04/30/2023 0600 Gross per 24 hour  Intake 80 ml  Output 475 ml  Net -395 ml      Physical Exam: General: Super morbidly obese gentleman, laying in the bed  HEENT Moist oral mucous membranes  Pulm/lungs Normal breathing effort room air  CVS/Heart No rub  Abdomen:  Obese, nontender  Extremities: Significant dependent peripheral edema on the thighs  Neurologic: Alert, oriented  Skin: Lichenified skin over calves and lower thighs  Dialysis Access: None at present       Basic Metabolic Panel:  Recent Labs  Lab 04/24/23 0430 04/25/23 0540 04/26/23 0455 04/27/23 0623 04/27/23 0907 04/28/23 0703 04/29/23 0602 04/30/23 0450  NA 126*   < > 128*  --  129* 129* 130* 130*  K 3.2*   < > 4.8  --  4.5 4.4 4.0 3.6  CL 96*   < > 103  --  102 103 105 103  CO2 20*   < > 15*  --  16* 15* 17* 16*  GLUCOSE 116*   < > 94  --  95 99 100* 94  BUN 36*   < > 37*  --  37* 40* 44* 46*  CREATININE 1.06    < > 1.33*  --  1.40* 1.49* 1.56* 1.61*  CALCIUM 7.5*   < > 7.8*  --  8.0* 8.2* 8.1* 8.2*  MG 2.0  --   --   --   --   --   --   --   PHOS 2.9   < > 2.7 2.7  --  3.1  3.0 3.1 3.2   < > = values in this interval not displayed.      CBC: Recent Labs  Lab 04/26/23 0455 04/27/23 0623 04/27/23 1831 04/28/23 0703 04/29/23 0602 04/30/23 0450  WBC 31.3* 27.1* 25.3* 30.0* 21.2* 24.1*  NEUTROABS 24.9* 21.0*  --  24.2* 16.9* 20.2*  HGB 7.5* 7.2* 8.7* 9.1* 8.5* 8.8*  HCT 23.5* 22.2* 27.1* 28.4* 25.8* 26.5*  MCV 84.5 85.1 85.0 84.0 82.7 82.6  PLT 462* 440* 369 501* 400 393      No results found for: "HEPBSAG", "HEPBSAB", "HEPBIGM"    Microbiology:  Recent Results (from the past 240 hour(s))  Blood Culture (routine x 2)     Status: Abnormal   Collection Time: 04/21/23  2:02 AM   Specimen: BLOOD  Result Value Ref Range Status   Specimen  Description   Final    BLOOD RIGHT ARM Performed at [facility], [address] [place], [place] [address]    Special Requests   Final    BOTTLES DRAWN AEROBIC AND ANAEROBIC Blood Culture adequate volume Performed at [facility], [address] [place], [place] [address]    Culture  Setup Time   Final    GRAM POSITIVE COCCI IN BOTH AEROBIC AND ANAEROBIC BOTTLES CRITICAL VALUE NOTED.  VALUE IS CONSISTENT WITH PREVIOUSLY REPORTED AND CALLED VALUE. Performed at [facility], [address] [place], [place] [address]    Culture (A)  Final    STAPHYLOCOCCUS CAPITIS STAPHYLOCOCCUS AURICULARIS    Report Status 04/26/2023 FINAL  Final   Organism ID, Bacteria STAPHYLOCOCCUS CAPITIS  Final   Organism ID, Bacteria STAPHYLOCOCCUS AURICULARIS  Final      Susceptibility   Staphylococcus auricularis - MIC*    CIPROFLOXACIN 4 RESISTANT Resistant     ERYTHROMYCIN >=8 RESISTANT Resistant     GENTAMICIN <=0.5 SENSITIVE Sensitive     OXACILLIN >=4 RESISTANT Resistant     TETRACYCLINE <=1 SENSITIVE Sensitive      VANCOMYCIN <=0.5 SENSITIVE Sensitive     TRIMETH/SULFA <=10 SENSITIVE Sensitive     CLINDAMYCIN RESISTANT Resistant     RIFAMPIN <=0.5 SENSITIVE Sensitive     Inducible Clindamycin POSITIVE Resistant     * STAPHYLOCOCCUS AURICULARIS   Staphylococcus capitis - MIC*    CIPROFLOXACIN 4 RESISTANT Resistant     ERYTHROMYCIN >=8 RESISTANT Resistant     GENTAMICIN <=0.5 SENSITIVE Sensitive     OXACILLIN >=4 RESISTANT Resistant     TETRACYCLINE >=16 RESISTANT Resistant     VANCOMYCIN <=0.5 SENSITIVE Sensitive     TRIMETH/SULFA <=10 SENSITIVE Sensitive     CLINDAMYCIN INTERMEDIATE Intermediate     RIFAMPIN 1 SENSITIVE Sensitive     Inducible Clindamycin NEGATIVE Sensitive     * STAPHYLOCOCCUS CAPITIS  SARS Coronavirus 2 by RT PCR (hospital order, performed in [facility] hospital lab) *cepheid single result test* Anterior Nasal Swab     Status: None   Collection Time: 04/21/23  2:18 AM   Specimen: Anterior Nasal Swab  Result Value Ref Range Status   SARS Coronavirus 2 by RT PCR NEGATIVE NEGATIVE Final    Comment: (NOTE) SARS-CoV-2 target nucleic acids are NOT DETECTED.  The SARS-CoV-2 RNA is generally detectable in upper and lower respiratory specimens during the acute phase of infection. The lowest concentration of SARS-CoV-2 viral copies this assay can detect is 250 copies / mL. A negative result does not preclude SARS-CoV-2 infection and should not be used as the sole basis for treatment or other patient management decisions.  A negative result may occur with improper specimen collection / handling, submission of specimen other than nasopharyngeal swab, presence of viral mutation(s) within the areas targeted by this assay, and inadequate number of viral copies (<250 copies / mL). A negative result must be combined with clinical observations, patient history, and epidemiological information.  Fact Sheet for Patients:   RoadLapTop.co.za  Fact Sheet for  Healthcare Providers: http://kim-miller.com/  This test is not yet approved or  cleared by the [place] FDA and has been authorized for detection and/or diagnosis of SARS-CoV-2 by FDA under an Emergency Use Authorization (EUA).  This EUA will remain in effect (meaning this test can be used) for the duration of the COVID-19 declaration under Section 564(b)(1) of the Act, 21 U.S.C. section 360bbb-3(b)(1), unless the authorization is terminated or  revoked sooner.  Performed at [facility], [address] [place], [place] [address]   Blood Culture (routine x 2)     Status: Abnormal   Collection Time: 04/21/23  2:36 AM   Specimen: BLOOD  Result Value Ref Range Status   Specimen Description   Final    BLOOD  LEFT [facility] Performed at [facility], [address], [place], [place] [address]    Special Requests   Final    BOTTLES DRAWN AEROBIC AND ANAEROBIC Blood Culture adequate volume Performed at [facility], [address] [place], [place] [address]    Culture  Setup Time   Final    GRAM POSITIVE COCCI AEROBIC BOTTLE ONLY CRITICAL RESULT CALLED TO, READ BACK BY AND VERIFIED WITH: JASON ROBINS AT 2213 ON 04/21/23 BY SS    Culture (A)  Final    STAPHYLOCOCCUS CAPITIS UNABLE TO RECOVER SEPI IN CULTURE Performed at [facility], [address], [place], [place] [address]    Report Status 04/26/2023 FINAL  Final   Organism ID, Bacteria STAPHYLOCOCCUS CAPITIS  Final      Susceptibility   Staphylococcus capitis - MIC*    CIPROFLOXACIN <=0.5 SENSITIVE Sensitive     ERYTHROMYCIN >=8 RESISTANT Resistant     GENTAMICIN <=0.5 SENSITIVE Sensitive     OXACILLIN <=0.25 SENSITIVE Sensitive     TETRACYCLINE >=16 RESISTANT Resistant     VANCOMYCIN <=0.5 SENSITIVE Sensitive     TRIMETH/SULFA <=10 SENSITIVE Sensitive     CLINDAMYCIN RESISTANT Resistant     RIFAMPIN <=0.5 SENSITIVE Sensitive     Inducible Clindamycin POSITIVE  Resistant     * STAPHYLOCOCCUS CAPITIS  Blood Culture ID Panel (Reflexed)     Status: Abnormal   Collection Time: 04/21/23  2:36 AM  Result Value Ref Range Status   Enterococcus faecalis NOT DETECTED NOT DETECTED Final   Enterococcus Faecium NOT DETECTED NOT DETECTED Final   Listeria monocytogenes NOT DETECTED NOT DETECTED Final   Staphylococcus species DETECTED (A) NOT DETECTED Final    Comment: CRITICAL RESULT CALLED TO, READ BACK BY AND VERIFIED WITH: JASON ROBINS AT 2213 ON 04/21/23 BY SS    Staphylococcus aureus (BCID) NOT DETECTED NOT DETECTED Final   Staphylococcus epidermidis DETECTED (A) NOT DETECTED Final    Comment: Methicillin (oxacillin) resistant coagulase negative staphylococcus. Possible blood culture contaminant (unless isolated from more than one blood culture draw or clinical case suggests pathogenicity). No antibiotic treatment is indicated for blood  culture contaminants. CRITICAL RESULT CALLED TO, READ BACK BY AND VERIFIED WITH: JASON ROBINS AT 2213 ON 04/21/23 BY SS    Staphylococcus lugdunensis NOT DETECTED NOT DETECTED Final   Streptococcus species NOT DETECTED NOT DETECTED Final   Streptococcus agalactiae NOT DETECTED NOT DETECTED Final   Streptococcus pneumoniae NOT DETECTED NOT DETECTED Final   Streptococcus pyogenes NOT DETECTED NOT DETECTED Final   A.calcoaceticus-baumannii NOT DETECTED NOT DETECTED Final   Bacteroides fragilis NOT DETECTED NOT DETECTED Final   Enterobacterales NOT DETECTED NOT DETECTED Final   Enterobacter cloacae complex NOT DETECTED NOT DETECTED Final   Escherichia coli NOT DETECTED NOT DETECTED Final   Klebsiella aerogenes NOT DETECTED NOT DETECTED Final   Klebsiella oxytoca NOT DETECTED NOT DETECTED Final   Klebsiella pneumoniae NOT DETECTED NOT DETECTED Final   Proteus species NOT DETECTED NOT DETECTED Final   Salmonella species NOT DETECTED NOT DETECTED Final   Serratia marcescens NOT DETECTED NOT DETECTED Final   Haemophilus  influenzae NOT DETECTED NOT  DETECTED Final   Neisseria meningitidis NOT DETECTED NOT DETECTED Final   Pseudomonas aeruginosa NOT DETECTED NOT DETECTED Final   Stenotrophomonas maltophilia NOT DETECTED NOT DETECTED Final   Candida albicans NOT DETECTED NOT DETECTED Final   Candida auris NOT DETECTED NOT DETECTED Final   Candida glabrata NOT DETECTED NOT DETECTED Final   Candida krusei NOT DETECTED NOT DETECTED Final   Candida parapsilosis NOT DETECTED NOT DETECTED Final   Candida tropicalis NOT DETECTED NOT DETECTED Final   Cryptococcus neoformans/gattii NOT DETECTED NOT DETECTED Final   Methicillin resistance mecA/C DETECTED (A) NOT DETECTED Final    Comment: CRITICAL RESULT CALLED TO, READ BACK BY AND VERIFIED WITH: JASON ROBINS AT 2213 ON 04/21/23 BY SS Performed at [facility], [address] [place], [place] [address]   Urine Culture (for pregnant, neutropenic or urologic patients or patients with an indwelling urinary catheter)     Status: None   Collection Time: 04/22/23  5:22 PM   Specimen: Urine, Clean Catch  Result Value Ref Range Status   Specimen Description   Final    URINE, CLEAN CATCH Performed at [facility], [address] [place], [place] [address]    Special Requests   Final    NONE Performed at [facility], [address], [place], [place] [address]    Culture   Final    NO GROWTH Performed at [facility], [address] [place], [place] [address]    Report Status 04/23/2023 FINAL  Final  Culture, blood (Routine X 2) w Reflex to ID Panel     Status: Abnormal   Collection Time: 04/23/23  6:10 AM   Specimen: BLOOD  Result Value Ref Range Status   Specimen Description   Final    BLOOD  LEFT HAND Performed at [facility], [address], [place], [place] [address]    Special Requests   Final    BOTTLES DRAWN AEROBIC ONLY Blood Culture results may not be optimal due to an inadequate volume of blood received  in culture bottles Performed at [facility], [address] [place], [place] [address]    Culture  Setup Time   Final    GRAM POSITIVE COCCI AEROBIC BOTTLE ONLY CRITICAL RESULT CALLED TO, READ BACK BY AND VERIFIED WITH: DEVON MITCHELL 04/24/2023 1422 CP Performed at [facility], [address] [place], [place] [address]    Culture STAPHYLOCOCCUS AURICULARIS (A)  Final   Report Status 04/30/2023 FINAL  Final   Organism ID, Bacteria STAPHYLOCOCCUS AURICULARIS  Final      Susceptibility   Staphylococcus auricularis - MIC*    CIPROFLOXACIN >=8 RESISTANT Resistant     ERYTHROMYCIN 4 INTERMEDIATE Intermediate     GENTAMICIN <=0.5 SENSITIVE Sensitive     OXACILLIN >=4 RESISTANT Resistant     TETRACYCLINE >=16 RESISTANT Resistant     VANCOMYCIN 1 SENSITIVE Sensitive     TRIMETH/SULFA <=10 SENSITIVE Sensitive     CLINDAMYCIN RESISTANT Resistant     RIFAMPIN <=0.5 SENSITIVE Sensitive     Inducible Clindamycin POSITIVE Resistant     * STAPHYLOCOCCUS AURICULARIS  Culture, blood (Routine X 2) w Reflex to ID Panel     Status: None   Collection Time: 04/23/23  6:21 AM   Specimen: BLOOD  Result Value Ref Range Status   Specimen Description BLOOD  RIGHT HAND  Final   Special Requests   Final    BOTTLES DRAWN AEROBIC AND ANAEROBIC Blood Culture  adequate volume   Culture   Final    NO GROWTH 5 DAYS Performed at [facility], [address] [place], [place] [address]    Report Status 04/28/2023 FINAL  Final  Culture, blood (Routine X 2) w Reflex to ID Panel     Status: None (Preliminary result)   Collection Time: 04/28/23  7:05 AM   Specimen: BLOOD  Result Value Ref Range Status   Specimen Description BLOOD  RIGHT ARM  Final   Special Requests   Final    BOTTLES DRAWN AEROBIC AND ANAEROBIC Blood Culture adequate volume   Culture   Final    NO GROWTH 2 DAYS Performed at [facility], [address] [place], [place] [address]    Report Status  PENDING  Incomplete  Culture, blood (Routine X 2) w Reflex to ID Panel     Status: None (Preliminary result)   Collection Time: 04/28/23  7:05 AM   Specimen: BLOOD  Result Value Ref Range Status   Specimen Description BLOOD  LEFT AC  Final   Special Requests   Final    BOTTLES DRAWN AEROBIC AND ANAEROBIC Blood Culture results may not be optimal due to an excessive volume of blood received in culture bottles   Culture  Setup Time PENDING  Incomplete   Culture   Final    NO GROWTH 2 DAYS Performed at [facility], [address] [place], [place] [address]    Report Status PENDING  Incomplete  Gastrointestinal Panel by PCR , Stool     Status: None   Collection Time: 04/30/23  5:50 AM   Specimen: Stool  Result Value Ref Range Status   Campylobacter species NOT DETECTED NOT DETECTED Final   Plesimonas shigelloides NOT DETECTED NOT DETECTED Final   Salmonella species NOT DETECTED NOT DETECTED Final   Yersinia enterocolitica NOT DETECTED NOT DETECTED Final   Vibrio species NOT DETECTED NOT DETECTED Final   Vibrio cholerae NOT DETECTED NOT DETECTED Final   Enteroaggregative E coli (EAEC) NOT DETECTED NOT DETECTED Final   Enteropathogenic E coli (EPEC) NOT DETECTED NOT DETECTED Final   Enterotoxigenic E coli (ETEC) NOT DETECTED NOT DETECTED Final   Shiga like toxin producing E coli (STEC) NOT DETECTED NOT DETECTED Final   Shigella/Enteroinvasive E coli (EIEC) NOT DETECTED NOT DETECTED Final   Cryptosporidium NOT DETECTED NOT DETECTED Final   Cyclospora cayetanensis NOT DETECTED NOT DETECTED Final   Entamoeba histolytica NOT DETECTED NOT DETECTED Final   Giardia lamblia NOT DETECTED NOT DETECTED Final   Adenovirus F40/41 NOT DETECTED NOT DETECTED Final   Astrovirus NOT DETECTED NOT DETECTED Final   Norovirus GI/GII NOT DETECTED NOT DETECTED Final   Rotavirus A NOT DETECTED NOT DETECTED Final   Sapovirus (I, II, IV, and V) NOT DETECTED NOT DETECTED Final    Comment: Performed at  [facility], [address] [place], [place] [address]    Coagulation Studies: No results for input(s): "LABPROT", "INR" in the last 72 hours.  Urinalysis: No results for input(s): "COLORURINE", "LABSPEC", "PHURINE", "GLUCOSEU", "HGBUR", "BILIRUBINUR", "KETONESUR", "PROTEINUR", "UROBILINOGEN", "NITRITE", "LEUKOCYTESUR" in the last 72 hours.  Invalid input(s): "APPERANCEUR"    Imaging: No results found.   Medications:    albumin human 12.5 g (04/30/23 1006)    vitamin C  500 mg Oral BID   Chlorhexidine Gluconate Cloth  6 each Topical Daily   copper  8 mg Oral Daily   feeding supplement (KATE FARMS STANDARD 1.4)  325  mL Oral TID BM   folic acid  1 mg Oral Daily   heparin injection (subcutaneous)  5,000 Units Subcutaneous Q8H   hydrocortisone sod succinate (SOLU-CORTEF) inj  100 mg Intravenous Daily   insulin aspart  0-15 Units Subcutaneous TID WC   lactase  6,000 Units Oral TID WC   levothyroxine  100 mcg Oral Q0600   lipase/protease/amylase  24,000 Units Oral TID AC   midodrine  10 mg Oral TID WC   mirtazapine  7.5 mg Oral QHS   multivitamin  15 mL Oral Daily   pantoprazole (PROTONIX) IV  40 mg Intravenous Q24H   rifaximin  550 mg Oral TID   thiamine  100 mg Oral Daily   vancomycin variable dose per unstable renal function (pharmacist dosing)   Does not apply See admin instructions   vitamin A  10,000 Units Oral Daily   Vitamin D (Ergocalciferol)  50,000 Units Oral Q7 days   vitamin E  400 Units Oral Daily   zinc sulfate  220 mg Oral Daily   acetaminophen, bismuth subsalicylate, loperamide, polyethylene glycol  Assessment/ Plan:  47 y.o. male with super morbid obesity, hypertension, obstructive sleep apnea, lymphedema, history of bariatric surgery, cholelithiasis, severe hypothyroidism, bedridden status, history of IVC filter placement admitted on 04/21/2023 for Severe hypothyroidism [E03.9] Generalized weakness [R53.1] Lower limb ulcer, ankle, right, with  unspecified severity (HCC) [L97.319] Hypothyroidism, unspecified type [E03.9] Sepsis, due to unspecified organism, unspecified whether acute organ dysfunction present (HCC) [A41.9]   Acute kidney injury Likely secondary to hemodynamic alterations caused by septic shock and hypotension and possibly Vancomycin toxicity leading to ATN. Patient is nonoliguric. Recorded urine output of in past 24 hours. Creatinine increased but not at same rate as previous levels.   Generalized edema Patient has hypoalbuminemia and malnutrition. Daily IV albumin 12.5g for fluid mobilization. Consuming protein supplementation   Hyponatremia Likely secondary to AKI and generalized volume overload. Sodium remains 130  Hypotension, received pressor support in ICU.  Continue midodrine 10mg  TID.  Chronic systolic CHF LVEF 35 to 40%, moderately decreased function, global hypokinesis, grade 1 diastolic dysfunction, normal right ventricular systolic function from echo 04/22/2023.    LOS: 9 Wendee Beavers 5/2/202410:44 AM  [address] [place], [place] [phone]

## 2023-04-30 NOTE — Progress Notes (Signed)
ID Brief note   Afebrile, mild elevation in leukocytosis which seems to be chronic   Sensi of staph auricularis are not concordant and are likely contaminants. Will DC Vancomycin   D/w ID Pharm D ID will SO. Please recall if needed.   Results for orders placed or performed during the hospital encounter of 04/21/23  Blood Culture (routine x 2)     Status: Abnormal   Collection Time: 04/21/23  2:02 AM   Specimen: BLOOD  Result Value Ref Range Status   Specimen Description   Final    BLOOD RIGHT ARM Performed at [facility], [address], [place], [place] [address]    Special Requests   Final    BOTTLES DRAWN AEROBIC AND ANAEROBIC Blood Culture adequate volume Performed at [facility], [address], [place], [place] [address]    Culture  Setup Time   Final    GRAM POSITIVE COCCI IN BOTH AEROBIC AND ANAEROBIC BOTTLES CRITICAL VALUE NOTED.  VALUE IS CONSISTENT WITH PREVIOUSLY REPORTED AND CALLED VALUE. Performed at [facility], [address] [place], [place] [address]    Culture (A)  Final    STAPHYLOCOCCUS CAPITIS STAPHYLOCOCCUS AURICULARIS    Report Status 04/26/2023 FINAL  Final   Organism ID, Bacteria STAPHYLOCOCCUS CAPITIS  Final   Organism ID, Bacteria STAPHYLOCOCCUS AURICULARIS  Final      Susceptibility   Staphylococcus auricularis - MIC*    CIPROFLOXACIN 4 RESISTANT Resistant     ERYTHROMYCIN >=8 RESISTANT Resistant     GENTAMICIN <=0.5 SENSITIVE Sensitive     OXACILLIN >=4 RESISTANT Resistant     TETRACYCLINE <=1 SENSITIVE Sensitive     VANCOMYCIN <=0.5 SENSITIVE Sensitive     TRIMETH/SULFA <=10 SENSITIVE Sensitive     CLINDAMYCIN RESISTANT Resistant     RIFAMPIN <=0.5 SENSITIVE Sensitive     Inducible Clindamycin POSITIVE Resistant     * STAPHYLOCOCCUS AURICULARIS   Staphylococcus capitis - MIC*    CIPROFLOXACIN 4 RESISTANT Resistant     ERYTHROMYCIN >=8 RESISTANT Resistant     GENTAMICIN <=0.5 SENSITIVE Sensitive      OXACILLIN >=4 RESISTANT Resistant     TETRACYCLINE >=16 RESISTANT Resistant     VANCOMYCIN <=0.5 SENSITIVE Sensitive     TRIMETH/SULFA <=10 SENSITIVE Sensitive     CLINDAMYCIN INTERMEDIATE Intermediate     RIFAMPIN 1 SENSITIVE Sensitive     Inducible Clindamycin NEGATIVE Sensitive     * STAPHYLOCOCCUS CAPITIS  SARS Coronavirus 2 by RT PCR (hospital order, performed in [facility] hospital lab) *cepheid single result test* Anterior Nasal Swab     Status: None   Collection Time: 04/21/23  2:18 AM   Specimen: Anterior Nasal Swab  Result Value Ref Range Status   SARS Coronavirus 2 by RT PCR NEGATIVE NEGATIVE Final    Comment: (NOTE) SARS-CoV-2 target nucleic acids are NOT DETECTED.  The SARS-CoV-2 RNA is generally detectable in upper and lower respiratory specimens during the acute phase of infection. The lowest concentration of SARS-CoV-2 viral copies this assay can detect is 250 copies / mL. A negative result does not preclude SARS-CoV-2 infection and should not be used as the sole basis for treatment or other patient management decisions.  A negative result may occur with improper specimen collection / handling, submission of specimen other than nasopharyngeal swab, presence of viral mutation(s) within the areas targeted by this assay, and inadequate number of viral copies (<250 copies / mL). A negative result must be combined with clinical  observations, patient history, and epidemiological information.  Fact Sheet for Patients:   RoadLapTop.co.za  Fact Sheet for Healthcare Providers: http://kim-miller.com/  This test is not yet approved or  cleared by the [place] FDA and has been authorized for detection and/or diagnosis of SARS-CoV-2 by FDA under an Emergency Use Authorization (EUA).  This EUA will remain in effect (meaning this test can be used) for the duration of the COVID-19 declaration under Section 564(b)(1) of the Act, 21  U.S.C. section 360bbb-3(b)(1), unless the authorization is terminated or revoked sooner.  Performed at [facility], [address] [place], [place] [address]   Blood Culture (routine x 2)     Status: Abnormal   Collection Time: 04/21/23  2:36 AM   Specimen: BLOOD  Result Value Ref Range Status   Specimen Description   Final    BLOOD  LEFT [facility] Performed at [facility], [address] [place], [place] [address]    Special Requests   Final    BOTTLES DRAWN AEROBIC AND ANAEROBIC Blood Culture adequate volume Performed at [facility], [address] [place], [place] [address]    Culture  Setup Time   Final    GRAM POSITIVE COCCI AEROBIC BOTTLE ONLY CRITICAL RESULT CALLED TO, READ BACK BY AND VERIFIED WITH: JASON ROBINS AT 2213 ON 04/21/23 BY SS    Culture (A)  Final    STAPHYLOCOCCUS CAPITIS UNABLE TO RECOVER SEPI IN CULTURE Performed at [facility], [address] [place], [place] [address]    Report Status 04/26/2023 FINAL  Final   Organism ID, Bacteria STAPHYLOCOCCUS CAPITIS  Final      Susceptibility   Staphylococcus capitis - MIC*    CIPROFLOXACIN <=0.5 SENSITIVE Sensitive     ERYTHROMYCIN >=8 RESISTANT Resistant     GENTAMICIN <=0.5 SENSITIVE Sensitive     OXACILLIN <=0.25 SENSITIVE Sensitive     TETRACYCLINE >=16 RESISTANT Resistant     VANCOMYCIN <=0.5 SENSITIVE Sensitive     TRIMETH/SULFA <=10 SENSITIVE Sensitive     CLINDAMYCIN RESISTANT Resistant     RIFAMPIN <=0.5 SENSITIVE Sensitive     Inducible Clindamycin POSITIVE Resistant     * STAPHYLOCOCCUS CAPITIS  Blood Culture ID Panel (Reflexed)     Status: Abnormal   Collection Time: 04/21/23  2:36 AM  Result Value Ref Range Status   Enterococcus faecalis NOT DETECTED NOT DETECTED Final   Enterococcus Faecium NOT DETECTED NOT DETECTED Final   Listeria monocytogenes NOT DETECTED NOT DETECTED Final   Staphylococcus species DETECTED (A) NOT DETECTED Final    Comment:  CRITICAL RESULT CALLED TO, READ BACK BY AND VERIFIED WITH: JASON ROBINS AT 2213 ON 04/21/23 BY SS    Staphylococcus aureus (BCID) NOT DETECTED NOT DETECTED Final   Staphylococcus epidermidis DETECTED (A) NOT DETECTED Final    Comment: Methicillin (oxacillin) resistant coagulase negative staphylococcus. Possible blood culture contaminant (unless isolated from more than one blood culture draw or clinical case suggests pathogenicity). No antibiotic treatment is indicated for blood  culture contaminants. CRITICAL RESULT CALLED TO, READ BACK BY AND VERIFIED WITH: JASON ROBINS AT 2213 ON 04/21/23 BY SS    Staphylococcus lugdunensis NOT DETECTED NOT DETECTED Final   Streptococcus species NOT DETECTED NOT DETECTED Final   Streptococcus agalactiae NOT DETECTED NOT DETECTED Final   Streptococcus pneumoniae NOT DETECTED NOT DETECTED Final   Streptococcus pyogenes NOT DETECTED NOT DETECTED Final   A.calcoaceticus-baumannii NOT DETECTED NOT DETECTED Final   Bacteroides fragilis NOT DETECTED NOT  DETECTED Final   Enterobacterales NOT DETECTED NOT DETECTED Final   Enterobacter cloacae complex NOT DETECTED NOT DETECTED Final   Escherichia coli NOT DETECTED NOT DETECTED Final   Klebsiella aerogenes NOT DETECTED NOT DETECTED Final   Klebsiella oxytoca NOT DETECTED NOT DETECTED Final   Klebsiella pneumoniae NOT DETECTED NOT DETECTED Final   Proteus species NOT DETECTED NOT DETECTED Final   Salmonella species NOT DETECTED NOT DETECTED Final   Serratia marcescens NOT DETECTED NOT DETECTED Final   Haemophilus influenzae NOT DETECTED NOT DETECTED Final   Neisseria meningitidis NOT DETECTED NOT DETECTED Final   Pseudomonas aeruginosa NOT DETECTED NOT DETECTED Final   Stenotrophomonas maltophilia NOT DETECTED NOT DETECTED Final   Candida albicans NOT DETECTED NOT DETECTED Final   Candida auris NOT DETECTED NOT DETECTED Final   Candida glabrata NOT DETECTED NOT DETECTED Final   Candida krusei NOT DETECTED NOT  DETECTED Final   Candida parapsilosis NOT DETECTED NOT DETECTED Final   Candida tropicalis NOT DETECTED NOT DETECTED Final   Cryptococcus neoformans/gattii NOT DETECTED NOT DETECTED Final   Methicillin resistance mecA/C DETECTED (A) NOT DETECTED Final    Comment: CRITICAL RESULT CALLED TO, READ BACK BY AND VERIFIED WITH: JASON ROBINS AT 2213 ON 04/21/23 BY SS Performed at [facility], [address], [place], [place] [address]   Urine Culture (for pregnant, neutropenic or urologic patients or patients with an indwelling urinary catheter)     Status: None   Collection Time: 04/22/23  5:22 PM   Specimen: Urine, Clean Catch  Result Value Ref Range Status   Specimen Description   Final    URINE, CLEAN CATCH Performed at [facility], [address] [place], [place] [address]    Special Requests   Final    NONE Performed at [facility], [address], [place], [place] [address]    Culture   Final    NO GROWTH Performed at [facility], [address] [place], [place] [address]    Report Status 04/23/2023 FINAL  Final  Culture, blood (Routine X 2) w Reflex to ID Panel     Status: Abnormal   Collection Time: 04/23/23  6:10 AM   Specimen: BLOOD  Result Value Ref Range Status   Specimen Description   Final    BLOOD  LEFT HAND Performed at [facility], [address], [place], [place] [address]    Special Requests   Final    BOTTLES DRAWN AEROBIC ONLY Blood Culture results may not be optimal due to an inadequate volume of blood received in culture bottles Performed at [facility], [address], [place], [place] [address]    Culture  Setup Time   Final    GRAM POSITIVE COCCI AEROBIC BOTTLE ONLY CRITICAL RESULT CALLED TO, READ BACK BY AND VERIFIED WITH: DEVON MITCHELL 04/24/2023 1422 CP Performed at [facility], [address] [place], [place] [address]    Culture STAPHYLOCOCCUS AURICULARIS (A)  Final   Report  Status 04/30/2023 FINAL  Final   Organism ID, Bacteria STAPHYLOCOCCUS AURICULARIS  Final      Susceptibility   Staphylococcus auricularis - MIC*    CIPROFLOXACIN >=8 RESISTANT Resistant     ERYTHROMYCIN 4 INTERMEDIATE Intermediate     GENTAMICIN <=0.5 SENSITIVE Sensitive     OXACILLIN >=4 RESISTANT Resistant     TETRACYCLINE >=16 RESISTANT Resistant     VANCOMYCIN 1 SENSITIVE Sensitive     TRIMETH/SULFA <=10 SENSITIVE Sensitive  CLINDAMYCIN RESISTANT Resistant     RIFAMPIN <=0.5 SENSITIVE Sensitive     Inducible Clindamycin POSITIVE Resistant     * STAPHYLOCOCCUS AURICULARIS  Culture, blood (Routine X 2) w Reflex to ID Panel     Status: None   Collection Time: 04/23/23  6:21 AM   Specimen: BLOOD  Result Value Ref Range Status   Specimen Description BLOOD  RIGHT HAND  Final   Special Requests   Final    BOTTLES DRAWN AEROBIC AND ANAEROBIC Blood Culture adequate volume   Culture   Final    NO GROWTH 5 DAYS Performed at [facility], [address] [place], [place] [address]    Report Status 04/28/2023 FINAL  Final  Culture, blood (Routine X 2) w Reflex to ID Panel     Status: None (Preliminary result)   Collection Time: 04/28/23  7:05 AM   Specimen: BLOOD  Result Value Ref Range Status   Specimen Description BLOOD  RIGHT ARM  Final   Special Requests   Final    BOTTLES DRAWN AEROBIC AND ANAEROBIC Blood Culture adequate volume   Culture   Final    NO GROWTH 2 DAYS Performed at [facility], [address], [place], [place] [address]    Report Status PENDING  Incomplete  Culture, blood (Routine X 2) w Reflex to ID Panel     Status: None (Preliminary result)   Collection Time: 04/28/23  7:05 AM   Specimen: BLOOD  Result Value Ref Range Status   Specimen Description BLOOD  LEFT AC  Final   Special Requests   Final    BOTTLES DRAWN AEROBIC AND ANAEROBIC Blood Culture results may not be optimal due to an excessive volume of blood received in culture  bottles   Culture  Setup Time PENDING  Incomplete   Culture   Final    NO GROWTH 2 DAYS Performed at [facility], [address] [place], [place] [address]    Report Status PENDING  Incomplete  Gastrointestinal Panel by PCR , Stool     Status: None   Collection Time: 04/30/23  5:50 AM   Specimen: Stool  Result Value Ref Range Status   Campylobacter species NOT DETECTED NOT DETECTED Final   Plesimonas shigelloides NOT DETECTED NOT DETECTED Final   Salmonella species NOT DETECTED NOT DETECTED Final   Yersinia enterocolitica NOT DETECTED NOT DETECTED Final   Vibrio species NOT DETECTED NOT DETECTED Final   Vibrio cholerae NOT DETECTED NOT DETECTED Final   Enteroaggregative E coli (EAEC) NOT DETECTED NOT DETECTED Final   Enteropathogenic E coli (EPEC) NOT DETECTED NOT DETECTED Final   Enterotoxigenic E coli (ETEC) NOT DETECTED NOT DETECTED Final   Shiga like toxin producing E coli (STEC) NOT DETECTED NOT DETECTED Final   Shigella/Enteroinvasive E coli (EIEC) NOT DETECTED NOT DETECTED Final   Cryptosporidium NOT DETECTED NOT DETECTED Final   Cyclospora cayetanensis NOT DETECTED NOT DETECTED Final   Entamoeba histolytica NOT DETECTED NOT DETECTED Final   Giardia lamblia NOT DETECTED NOT DETECTED Final   Adenovirus F40/41 NOT DETECTED NOT DETECTED Final   Astrovirus NOT DETECTED NOT DETECTED Final   Norovirus GI/GII NOT DETECTED NOT DETECTED Final   Rotavirus A NOT DETECTED NOT DETECTED Final   Sapovirus (I, II, IV, and V) NOT DETECTED NOT DETECTED Final    Comment: Performed at [facility], [address] [place], [place] [address]

## 2023-04-30 NOTE — Consult Note (Signed)
[facility] Face-to-Face Psychiatry Consult   Reason for Consult: Consult for this 47 year old man with multiple severe medical problems for concerns of depression. Referring Physician:  Georgeann Oppenheim Patient Identification: Christopher Herman MRN:  [id number] Principal Diagnosis: Severe hypothyroidism Diagnosis:  Principal Problem:   Severe hypothyroidism Active Problems:   Malnutrition of moderate degree (HCC)   Generalized weakness   Ulcer of right ankle (HCC)   Hypotension due to hypovolemia   Acute renal failure due to tubular necrosis (HCC)   Hypothyroidism   Total Time spent with patient: 45 minutes  Subjective:   Christopher Herman is a 47 y.o. male patient admitted with "I am just tired".  HPI: Patient seen and chart reviewed.  47 year old man with multiple medical problems.  Chronic severe obesity, came in hypotensive thought to possibly be septic having renal problems hypothyroid.  Still struggling to maintain blood pressure.  Staff concerned about whether depression could be involved.  Patient was awake in bed but seemed sluggish and only slowly cooperated with the interview.  He started the interview out by saying "I am not depressed I am just tired".  He repeated this several times.  He said he is not able to sleep at night and is feeling fatigued all the time during the day.  Denies feeling hopeless or sad all of the time although when asked about suicidal thoughts admits that he has had some in the past but says that none of it has been recent.  One major concern currently is the patient is not eating adequately for an unclear reason.  He was started on very low-dose mirtazapine last night at least in part to try to help with the appetite.  Patient is able to articulate a couple of positive things in his life but not a great deal.  Sounds like he mostly is pretty idle at home.  Affect blunted.  Slow but did not appear to be psychotic.  Past Psychiatric History: Patient denies any past history of  any mental health treatment whatsoever.  Says he has never been on medicine for any mental health issues never seen a mental health provider.  He did admit that there have been times in the past when "once or twice" he may have had suicidal thoughts but says he has never acted on it.  Risk to Self:   Risk to Others:   Prior Inpatient Therapy:   Prior Outpatient Therapy:    Past Medical History:  Past Medical History:  Diagnosis Date   AKI (acute kidney injury) (HCC)    Cholelithiasis    Chronic acquired lymphedema    GERD (gastroesophageal reflux disease)    Helicobacter pylori infection 09/10/2015   History of sepsis    Leg muscle spasm    Leukocytosis    Lipoma of both lower extremities    Palpitations 05/31/2009   Chronic, stable   Presence of IVC filter    Rhabdomyolysis    Sleep apnea    Tachycardia    Vitamin D deficiency     Past Surgical History:  Procedure Laterality Date   GASTRIC BYPASS  05/23/2012   Family History:  Family History  Problem Relation Age of Onset   Cancer Paternal Grandmother    Cancer Paternal Grandfather    Family Psychiatric  History: None reported Social History:  Social History   Substance and Sexual Activity  Alcohol Use No     Social History   Substance and Sexual Activity  Drug Use No  Social History   Socioeconomic History   Marital status: Single    Spouse name: Not on file   Number of children: 0   Years of education: Not on file   Highest education level: Not on file  Occupational History   Occupation: Employed    Comment: Works as a Ship broker  Tobacco Use   Smoking status: Former   Smokeless tobacco: Never   Tobacco comments:    Quit smoking in 2010, smoked cigarettes, smoked for about 16 years; smoked less than 1/2 pack per day.  Substance and Sexual Activity   Alcohol use: No   Drug use: No   Sexual activity: Not Currently    Birth control/protection: None  Other Topics Concern   Not on file   Social History Narrative   Not on file   Social Determinants of Health   Financial Resource Strain: Not on file  Food Insecurity: No Food Insecurity (11/04/2022)   Hunger Vital Sign    Worried About Running Out of Food in the Last Year: Never true    Ran Out of Food in the Last Year: Never true  Transportation Needs: No Transportation Needs (11/04/2022)   PRAPARE - Administrator, Civil Service (Medical): No    Lack of Transportation (Non-Medical): No  Physical Activity: Inactive (11/04/2022)   Exercise Vital Sign    Days of Exercise per Week: 0 days    Minutes of Exercise per Session: 0 min  Stress: Not on file  Social Connections: Not on file   Additional Social History:    Allergies:  No Known Allergies  Labs:  Results for orders placed or performed during the hospital encounter of 04/21/23 (from the past 48 hour(s))  Glucose, capillary     Status: Abnormal   Collection Time: 04/28/23 12:25 PM  Result Value Ref Range   Glucose-Capillary 108 (H) 70 - 99 mg/dL    Comment: Glucose reference range applies only to samples taken after fasting for at least 8 hours.  Glucose, capillary     Status: Abnormal   Collection Time: 04/28/23  4:32 PM  Result Value Ref Range   Glucose-Capillary 117 (H) 70 - 99 mg/dL    Comment: Glucose reference range applies only to samples taken after fasting for at least 8 hours.  CBC with Differential/Platelet     Status: Abnormal   Collection Time: 04/29/23  6:02 AM  Result Value Ref Range   WBC 21.2 (H) 4.0 - 10.5 K/uL   RBC 3.12 (L) 4.22 - 5.81 MIL/uL   Hemoglobin 8.5 (L) 13.0 - 17.0 g/dL   HCT 16.1 (L) 09.6 - 04.5 %   MCV 82.7 80.0 - 100.0 fL   MCH 27.2 26.0 - 34.0 pg   MCHC 32.9 30.0 - 36.0 g/dL   RDW 40.9 (H) 81.1 - 91.4 %   Platelets 400 150 - 400 K/uL   nRBC 0.0 0.0 - 0.2 %   Neutrophils Relative % 80 %   Neutro Abs 16.9 (H) 1.7 - 7.7 K/uL   Lymphocytes Relative 11 %   Lymphs Abs 2.3 0.7 - 4.0 K/uL   Monocytes Relative 6  %   Monocytes Absolute 1.3 (H) 0.1 - 1.0 K/uL   Eosinophils Relative 1 %   Eosinophils Absolute 0.3 0.0 - 0.5 K/uL   Basophils Relative 0 %   Basophils Absolute 0.0 0.0 - 0.1 K/uL   Immature Granulocytes 2 %   Abs Immature Granulocytes 0.41 (H) 0.00 - 0.07 K/uL  Comment: Performed at [facility], [address] [place], [place] [address]  Renal function panel     Status: Abnormal   Collection Time: 04/29/23  6:02 AM  Result Value Ref Range   Sodium 130 (L) 135 - 145 mmol/L   Potassium 4.0 3.5 - 5.1 mmol/L   Chloride 105 98 - 111 mmol/L   CO2 17 (L) 22 - 32 mmol/L   Glucose, Bld 100 (H) 70 - 99 mg/dL    Comment: Glucose reference range applies only to samples taken after fasting for at least 8 hours.   BUN 44 (H) 6 - 20 mg/dL   Creatinine, Ser 4.09 (H) 0.61 - 1.24 mg/dL   Calcium 8.1 (L) 8.9 - 10.3 mg/dL   Phosphorus 3.1 2.5 - 4.6 mg/dL   Albumin 2.6 (L) 3.5 - 5.0 g/dL   GFR, Estimated 55 (L) >60 mL/min    Comment: (NOTE) Calculated using the CKD-EPI Creatinine Equation (2021)    Anion gap 8 5 - 15    Comment: Performed at [facility], [address] [place], [place] [address]  Vancomycin, random     Status: None   Collection Time: 04/29/23  6:02 AM  Result Value Ref Range   Vancomycin Rm 45 ug/mL    Comment:        Random Vancomycin therapeutic range is dependent on dosage and time of specimen collection. A peak range is 20-40 ug/mL A trough range is 5-15 ug/mL        Performed at [facility], [address] [place], [place] [address]   Glucose, capillary     Status: Abnormal   Collection Time: 04/29/23  7:33 AM  Result Value Ref Range   Glucose-Capillary 100 (H) 70 - 99 mg/dL    Comment: Glucose reference range applies only to samples taken after fasting for at least 8 hours.  Glucose, capillary     Status: None   Collection Time: 04/29/23 11:24 AM  Result Value Ref Range   Glucose-Capillary 92 70 - 99 mg/dL    Comment: Glucose  reference range applies only to samples taken after fasting for at least 8 hours.  Glucose, capillary     Status: Abnormal   Collection Time: 04/29/23  4:31 PM  Result Value Ref Range   Glucose-Capillary 110 (H) 70 - 99 mg/dL    Comment: Glucose reference range applies only to samples taken after fasting for at least 8 hours.  CBC with Differential/Platelet     Status: Abnormal   Collection Time: 04/30/23  4:50 AM  Result Value Ref Range   WBC 24.1 (H) 4.0 - 10.5 K/uL   RBC 3.21 (L) 4.22 - 5.81 MIL/uL   Hemoglobin 8.8 (L) 13.0 - 17.0 g/dL   HCT 56.2 (L) 13.0 - 86.5 %   MCV 82.6 80.0 - 100.0 fL   MCH 27.4 26.0 - 34.0 pg   MCHC 33.2 30.0 - 36.0 g/dL   RDW 78.4 (H) 69.6 - 29.5 %   Platelets 393 150 - 400 K/uL   nRBC 0.0 0.0 - 0.2 %   Neutrophils Relative % 83 %   Neutro Abs 20.2 (H) 1.7 - 7.7 K/uL   Lymphocytes Relative 9 %   Lymphs Abs 2.1 0.7 - 4.0 K/uL   Monocytes Relative 6 %   Monocytes Absolute 1.4 (H) 0.1 - 1.0 K/uL   Eosinophils Relative 1 %   Eosinophils Absolute 0.1 0.0 - 0.5 K/uL   Basophils Relative 0 %  Basophils Absolute 0.0 0.0 - 0.1 K/uL   Immature Granulocytes 1 %   Abs Immature Granulocytes 0.30 (H) 0.00 - 0.07 K/uL    Comment: Performed at [facility], [address] [place], [place] [address]  Renal function panel     Status: Abnormal   Collection Time: 04/30/23  4:50 AM  Result Value Ref Range   Sodium 130 (L) 135 - 145 mmol/L   Potassium 3.6 3.5 - 5.1 mmol/L   Chloride 103 98 - 111 mmol/L   CO2 16 (L) 22 - 32 mmol/L   Glucose, Bld 94 70 - 99 mg/dL    Comment: Glucose reference range applies only to samples taken after fasting for at least 8 hours.   BUN 46 (H) 6 - 20 mg/dL   Creatinine, Ser 1.91 (H) 0.61 - 1.24 mg/dL   Calcium 8.2 (L) 8.9 - 10.3 mg/dL   Phosphorus 3.2 2.5 - 4.6 mg/dL   Albumin 2.7 (L) 3.5 - 5.0 g/dL   GFR, Estimated 53 (L) >60 mL/min    Comment: (NOTE) Calculated using the CKD-EPI Creatinine Equation (2021)    Anion  gap 11 5 - 15    Comment: Performed at [facility], [address] [place], [place] [address]  Gastrointestinal Panel by PCR , Stool     Status: None   Collection Time: 04/30/23  5:50 AM   Specimen: Stool  Result Value Ref Range   Campylobacter species NOT DETECTED NOT DETECTED   Plesimonas shigelloides NOT DETECTED NOT DETECTED   Salmonella species NOT DETECTED NOT DETECTED   Yersinia enterocolitica NOT DETECTED NOT DETECTED   Vibrio species NOT DETECTED NOT DETECTED   Vibrio cholerae NOT DETECTED NOT DETECTED   Enteroaggregative E coli (EAEC) NOT DETECTED NOT DETECTED   Enteropathogenic E coli (EPEC) NOT DETECTED NOT DETECTED   Enterotoxigenic E coli (ETEC) NOT DETECTED NOT DETECTED   Shiga like toxin producing E coli (STEC) NOT DETECTED NOT DETECTED   Shigella/Enteroinvasive E coli (EIEC) NOT DETECTED NOT DETECTED   Cryptosporidium NOT DETECTED NOT DETECTED   Cyclospora cayetanensis NOT DETECTED NOT DETECTED   Entamoeba histolytica NOT DETECTED NOT DETECTED   Giardia lamblia NOT DETECTED NOT DETECTED   Adenovirus F40/41 NOT DETECTED NOT DETECTED   Astrovirus NOT DETECTED NOT DETECTED   Norovirus GI/GII NOT DETECTED NOT DETECTED   Rotavirus A NOT DETECTED NOT DETECTED   Sapovirus (I, II, IV, and V) NOT DETECTED NOT DETECTED    Comment: Performed at [facility], [address] [place], [place] [address]  Glucose, capillary     Status: None   Collection Time: 04/30/23  7:21 AM  Result Value Ref Range   Glucose-Capillary 95 70 - 99 mg/dL    Comment: Glucose reference range applies only to samples taken after fasting for at least 8 hours.    Current Facility-Administered Medications  Medication Dose Route Frequency Provider Last Rate Last Admin   acetaminophen (TYLENOL) tablet 650 mg  650 mg Oral Q6H PRN Leeroy Bock, MD   650 mg at 04/26/23 1707   albumin human 25 % solution 12.5 g  12.5 g Intravenous Daily Wendee Beavers, NP 60 mL/hr at 04/30/23  1006 12.5 g at 04/30/23 1006   ascorbic acid (VITAMIN C) tablet 500 mg  500 mg Oral BID Erin Fulling, MD   500 mg at 04/30/23 1013   bismuth subsalicylate (PEPTO BISMOL) 262 MG/15ML suspension 30 mL  30 mL Oral TID PRN Leeroy Bock, MD  30 mL at 04/30/23 1610   Chlorhexidine Gluconate Cloth 2 % PADS 6 each  6 each Topical Daily Erin Fulling, MD   6 each at 04/30/23 1013   copper tablet 8 mg  8 mg Oral Daily Jamelle Rushing L, MD   8 mg at 04/30/23 1013   feeding supplement (KATE FARMS STANDARD 1.4) liquid 325 mL  325 mL Oral TID BM Leeroy Bock, MD   60 mL at 04/29/23 1716   folic acid (FOLVITE) tablet 1 mg  1 mg Oral Daily Kasa, Kurian, MD   1 mg at 04/30/23 1013   heparin injection 5,000 Units  5,000 Units Subcutaneous Q8H Jaynie Bream, RPH   5,000 Units at 04/30/23 0551   hydrocortisone sodium succinate (SOLU-CORTEF) 100 MG injection 100 mg  100 mg Intravenous Daily Jamelle Rushing L, MD   100 mg at 04/30/23 1011   insulin aspart (novoLOG) injection 0-15 Units  0-15 Units Subcutaneous TID WC Leeroy Bock, MD   2 Units at 04/24/23 1208   lactase (LACTAID) tablet 6,000 Units  6,000 Units Oral TID WC Leeroy Bock, MD   6,000 Units at 04/30/23 0757   levothyroxine (SYNTHROID) tablet 100 mcg  100 mcg Oral Q0600 Pilar Jarvis, MD   100 mcg at 04/30/23 0551   lipase/protease/amylase (CREON) capsule 24,000 Units  24,000 Units Oral TID Brooke Dare B, MD   24,000 Units at 04/30/23 0757   loperamide (IMODIUM) capsule 2 mg  2 mg Oral PRN Leeroy Bock, MD   2 mg at 04/30/23 0757   midodrine (PROAMATINE) tablet 10 mg  10 mg Oral TID WC Erin Fulling, MD   10 mg at 04/30/23 0757   mirtazapine (REMERON) tablet 15 mg  15 mg Oral QHS Farris Geiman, Jackquline Denmark, MD       multivitamin liquid 15 mL  15 mL Oral Daily Belia Heman, Kurian, MD   15 mL at 04/30/23 1012   pantoprazole (PROTONIX) injection 40 mg  40 mg Intravenous Q24H Tressie Ellis, RPH   40 mg at 04/29/23 2219    polyethylene glycol (MIRALAX / GLYCOLAX) packet 17 g  17 g Oral Daily PRN Jimmye Norman, NP       rifaximin Burman Blacksmith) tablet 550 mg  550 mg Oral TID Lolita Patella B, MD   550 mg at 04/30/23 1013   thiamine (VITAMIN B1) tablet 100 mg  100 mg Oral Daily Erin Fulling, MD   100 mg at 04/30/23 1013   vancomycin variable dose per unstable renal function (pharmacist dosing)   Does not apply See admin instructions Tressie Ellis, RPH       vitamin A capsule 10,000 Units  10,000 Units Oral Daily Erin Fulling, MD   10,000 Units at 04/29/23 1146   Vitamin D (Ergocalciferol) (DRISDOL) 1.25 MG (50000 UNIT) capsule 50,000 Units  50,000 Units Oral Q7 days Erin Fulling, MD   50,000 Units at 04/30/23 1013   vitamin E capsule 400 Units  400 Units Oral Daily Erin Fulling, MD   400 Units at 04/30/23 1012   zinc sulfate capsule 220 mg  220 mg Oral Daily Erin Fulling, MD   220 mg at 04/30/23 1013    Musculoskeletal: Strength & Muscle Tone: decreased Gait & Station: unable to stand Patient leans: N/A            Psychiatric Specialty Exam:  Presentation  General Appearance: No data recorded Eye Contact:No data recorded Speech:No data recorded Speech Volume:No data  recorded Handedness:No data recorded  Mood and Affect  Mood:No data recorded Affect:No data recorded  Thought Process  Thought Processes:No data recorded Descriptions of Associations:No data recorded Orientation:No data recorded Thought Content:No data recorded History of Schizophrenia/Schizoaffective disorder:No data recorded Duration of Psychotic Symptoms:No data recorded Hallucinations:No data recorded Ideas of Reference:No data recorded Suicidal Thoughts:No data recorded Homicidal Thoughts:No data recorded  Sensorium  Memory:No data recorded Judgment:No data recorded Insight:No data recorded  Executive Functions  Concentration:No data recorded Attention Span:No data recorded Recall:No data  recorded Fund of Knowledge:No data recorded Language:No data recorded  Psychomotor Activity  Psychomotor Activity:No data recorded  Assets  Assets:No data recorded  Sleep  Sleep:No data recorded  Physical Exam: Physical Exam Vitals and nursing note reviewed.  Constitutional:      Appearance: He is obese. He is ill-appearing.  HENT:     Head: Normocephalic and atraumatic.     Mouth/Throat:     Pharynx: Oropharynx is clear.  Eyes:     Pupils: Pupils are equal, round, and reactive to light.  Cardiovascular:     Rate and Rhythm: Normal rate and regular rhythm.  Pulmonary:     Effort: Pulmonary effort is normal.     Breath sounds: Normal breath sounds.  Abdominal:     General: Abdomen is flat.     Palpations: Abdomen is soft.  Musculoskeletal:        General: Normal range of motion.  Skin:    General: Skin is warm and dry.  Neurological:     General: No focal deficit present.     Mental Status: He is alert. Mental status is at baseline.  Psychiatric:        Attention and Perception: Attention normal.        Mood and Affect: Affect is blunt.        Speech: Speech is delayed.        Behavior: Behavior is slowed.        Thought Content: Thought content normal. Thought content does not include suicidal ideation.    Review of Systems  Constitutional:  Positive for malaise/fatigue.  HENT: Negative.    Eyes: Negative.   Respiratory: Negative.    Cardiovascular: Negative.   Gastrointestinal: Negative.   Musculoskeletal: Negative.   Skin: Negative.   Neurological: Negative.   Psychiatric/Behavioral:  Negative for depression, hallucinations, substance abuse and suicidal ideas. The patient has insomnia. The patient is not nervous/anxious.    Blood pressure 95/73, pulse (!) 110, temperature 98.3 F (36.8 C), temperature source Oral, resp. rate 12, height 5\' 5"  (1.651 m), weight (!) 169.4 kg, SpO2 100 %. Body mass index is 62.15 kg/m.  Treatment Plan Summary: Medication  management and Plan 47 year old man multiple medical problems still in the ICU.  Patient is very adamant and wanting to deny depression which to me suggest that he has reasons to be opposed to even assessing it.  Nevertheless he is not eating well seems to not be sleeping well at night certainly low energy although there could be multiple reasons for that.  I spoke with the nutritionist who reviewed with me how severe the concern is about his refusal to eat.  There does not seem to be a consistent reason why he is doing that.  At this point I do not think he needs a suicide sitter but I think that as part of the whole treatment it would be reasonable to continue with the mirtazapine if only to help possibly with the appetite and  maybe longer term with depression.  We will continue to follow-up as needed meanwhile I am increasing the mirtazapine to 15 mg and will continue to increase on that later.  I asked the patient if there was anything that anyone could provide for him in the hospital that would cheer them up or just generally make him feel better and he was not able to think of anything.  Disposition: No evidence of imminent risk to self or others at present.   Supportive therapy provided about ongoing stressors. See above  Mordecai Rasmussen, MD 04/30/2023 11:24 AM

## 2023-04-30 NOTE — Progress Notes (Signed)
PROGRESS NOTE    Christopher Herman  ZOX:[id number] DOB: May 31, 1976 DOA: 04/21/2023 PCP: Pcp, No    Brief Narrative:  47 y.o. male with a PMH significant for severe obesity s/p bariatric surgery, OSA, hypoventilation syndrome, HTN, GERD, cholelithiasis, bilateral extremity lipoma, chronic lymphedema status post IVC filter placement, IDA.   They presented from home to the ED on 04/21/2023 with generalized weakness, extreme fatigue, unintended weight loss, disuse of upper extremities and poor p.o. intake  x a few days. He is chronically bed bound and malnourished.    In the ED, it was found that they had BP 100/73  Pulse (!) 110  Temp 98.2 F (36.8 C) (Oral)  Resp 15  Ht 5\' 5"  (1.651 m)  Wt (!) 185.4 kg  SpO2 100%  BMI 68.02 kg/m .  Significant findings included WBC 27.5, hgb 9.5, platelet 249, Cr 0.74, Na+ 130, K+ 3.2. SARS-CoV-2 PCR negative, Influenza PCR> negative. Blood and urine cultures pending.  Chest Xray> no active cardiopulmonary process    They were initially treated with vancomycin, cefepime, metronidazole.  They were admitted to the ICU for septic shock with hypotension resistant to bolus and required vasopressors.    4/26: consulted nephrology for concerns of worsening renal function and electrolyte abnormalities. Restarted IVF for renal support and hypotension.    4/27:renal function continues to worsen. Vanc trough elevated so not receiving any more Abx currently.  4/28:again Cr continues to rise despite continued fluids and addition of albuterol. Vanc trough remains remarkably high. Consulted CCM as fluids need to be discontinued and having hypotension/tachycardia while on midodrine. May need to resume pressors. Still shock picture.  4/29:hgb decreasing and remains hypotensive despite albumin and midodrine. He is fatigued. No bleeding but likely becoming more anemic due to worsening kidney function. Spoke with patient and his mother on the phone this morning and we are  going to transfuse a unit of RBC today for hemodynamic support. Cr continues to rise.    04/28/23 - Despite blood transfusion and good response yesterday, not having improved fatigue. Blood pressures remain soft with mild improvement. MAP ~70. Seen by palliative with no change in management.   5/1: Remains on midodrine.  Maps appears slightly improved.  Palliative care and RD evaluated patient today.  Patient concerned about persistent diarrhea and is hesitant to eat.  Adjustments made per RD recommendations.  Psychiatry consulted as patient endorsed feeling depressed.   Assessment & Plan:   Principal Problem:   Severe hypothyroidism Active Problems:   Malnutrition of moderate degree (HCC)   Generalized weakness   Ulcer of right ankle (HCC)   Hypotension due to hypovolemia   Acute renal failure due to tubular necrosis (HCC)   Hypothyroidism  SEPTIC shock SOURCE-UTI Patient has multiple sources of infection including urine and bilateral lower extremity cellulitis.  Blood pressures remain adequate but somewhat borderline. Plan: Patient has received 5-day course of vancomycin.  ID feels blood cultures are likely contaminants so antibiotics have been discontinued.  Will continue blood pressure support with albumin and midodrine  Severe Hypothyroidism- Last TSH, Free T4 check was 11 years ago with normal TSH TSH: 9.134 Free T4:1.34 Am cortisol 4/23 WNL -s/p IV Synthroid 250 mcg followed by 100 mcg daily. Repeat TSH on 4/25 is 1.393 Plan: Continue Synthroid 100 mcg daily Continue hydrocortisone 100 mg daily, slowly wean as blood pressure improves   Acute renal failure- Cr 1.06>1.27>1.3>1.4>1.49 today, up from baseline of about 0.6 Hyponatremia- 130>126>128>>129.  hypokalemia- K+ 3.3>>4.5>4.4 s/p repletion  with IVF.  Renal US neg for hydronephrosis or obstruction. Foley removed 4/26.  Urine output remains adequate Plan: Nephrology consulted.  Currently no indication for inpatient  hemodialysis.  Creatinine slowly worsening.  Encourage p.o. intake.   Thrombocytosis  Significant leukocytosis and rising despite supportive care and antibiotics. WBC 27.5>32.8>44.4>32.4>31.3> 21.2. diff showing abs neutrophil predominance. Platelets [phone]. Morphology unremarkable on pathology report. Plan: Leukocytosis.  Somewhat chronic.  Continue with daily labs.  Antibiotics on hold for now.   Anemia of chronic disease-transfused 1u pRBCs 4/29.  Monitor and replace as necessary.  Maintain hemoglobin greater than 8   Morbid obesity  malnutrition s/p bariatric surgery  chronic diarrhea Patient has had issues with chronic diarrhea.  Discussed with RD at length.  Will initiate several agents to encourage p.o. intake and hopefully slow the volume of diarrhea.   Sinus tachycardia  prolonged Qtc- 501. HR 110s and ECG showing sinus. Asymptomatic - avoid Qtc prolonging agents   Bed-bound - PT/OT evaluation and therapy - frequent turning  Depression Patient endorsed to both me as well as palliative care feeling depressed.  This is likely impacting his oral intake and unfortunately delaying wound healing.  Initiated Remeron per RD recommendations.  Official consult to psychiatry placed 5/1.  Recommendations appreciated  DVT prophylaxis: SQ heparin Code Status: Full Family Communication: None Disposition Plan: Status is: Inpatient Remains inpatient appropriate because: Resolving septic shock   Level of care: Progressive  Consultants:  Nephrology Psychiatry Palliative care ID  Procedures:  None  Antimicrobials: Vancomycin   Subjective: Seen and examined.  Flattened affect.  No specific complaints this morning  Objective: Vitals:   04/30/23 1119 04/30/23 1200 04/30/23 1300 04/30/23 1400  BP: 95/73 93/68 108/87 106/78  Pulse: (!) 110 93 (!) 114 (!) 124  Resp: 12 14 14 14   Temp:  97.9 F (36.6 C)    TempSrc:  Oral    SpO2: 100% 100% 100% 99%  Weight:       Height:        Intake/Output Summary (Last 24 hours) at 04/30/2023 1532 Last data filed at 04/30/2023 1357 Gross per 24 hour  Intake 478.16 ml  Output 475 ml  Net 3.16 ml   Filed Weights   04/28/23 0321 04/29/23 0500 04/30/23 0500  Weight: (!) 172.2 kg (!) 169 kg (!) 169.4 kg    Examination:  General exam: Appears blunted in affect.  Weak and fatigued.  Chronically ill-appearing Respiratory system: Lung sounds diminished at bases.  Normal work of breathing.  2 L Cardiovascular system: S1-S2, RRR, no murmurs, marked chronic lymphedema BLE Gastrointestinal system: Obese, NT/ND, normal bowel sounds Central nervous system: Alert and oriented. No focal neurological deficits. Extremities: Markedly decreased power bilateral upper and lower extremities Skin: No rashes, lesions or ulcers Psychiatry: Judgement and insight appear impaired. Mood & affect flattened.     Data Reviewed: I have personally reviewed following labs and imaging studies  CBC: Recent Labs  Lab 04/26/23 0455 04/27/23 0623 04/27/23 1831 04/28/23 0703 04/29/23 0602 04/30/23 0450  WBC 31.3* 27.1* 25.3* 30.0* 21.2* 24.1*  NEUTROABS 24.9* 21.0*  --  24.2* 16.9* 20.2*  HGB 7.5* 7.2* 8.7* 9.1* 8.5* 8.8*  HCT 23.5* 22.2* 27.1* 28.4* 25.8* 26.5*  MCV 84.5 85.1 85.0 84.0 82.7 82.6  PLT 462* 440* 369 501* 400 393   Basic Metabolic Panel: Recent Labs  Lab 04/24/23 0430 04/25/23 0540 04/26/23 0455 04/27/23 0623 04/27/23 0907 04/28/23 0703 04/29/23 0602 04/30/23 0450  NA 126*   < >  128*  --  129* 129* 130* 130*  K 3.2*   < > 4.8  --  4.5 4.4 4.0 3.6  CL 96*   < > 103  --  102 103 105 103  CO2 20*   < > 15*  --  16* 15* 17* 16*  GLUCOSE 116*   < > 94  --  95 99 100* 94  BUN 36*   < > 37*  --  37* 40* 44* 46*  CREATININE 1.06   < > 1.33*  --  1.40* 1.49* 1.56* 1.61*  CALCIUM 7.5*   < > 7.8*  --  8.0* 8.2* 8.1* 8.2*  MG 2.0  --   --   --   --   --   --   --   PHOS 2.9   < > 2.7 2.7  --  3.1  3.0 3.1 3.2   <  > = values in this interval not displayed.   GFR: Estimated Creatinine Clearance: 85.8 mL/min (A) (by C-G formula based on SCr of 1.61 mg/dL (H)). Liver Function Tests: Recent Labs  Lab 04/26/23 0140 04/27/23 0907 04/28/23 0703 04/29/23 0602 04/30/23 0450  AST 14* 16  --   --   --   ALT 17 19  --   --   --   ALKPHOS 99 104  --   --   --   BILITOT 1.0 1.1  --   --   --   PROT 5.0* 5.2*  --   --   --   ALBUMIN 2.1* 2.4* 2.6* 2.6* 2.7*   No results for input(s): "LIPASE", "AMYLASE" in the last 168 hours. No results for input(s): "AMMONIA" in the last 168 hours. Coagulation Profile: No results for input(s): "INR", "PROTIME" in the last 168 hours. Cardiac Enzymes: No results for input(s): "CKTOTAL", "CKMB", "CKMBINDEX", "TROPONINI" in the last 168 hours. BNP (last 3 results) No results for input(s): "PROBNP" in the last 8760 hours. HbA1C: No results for input(s): "HGBA1C" in the last 72 hours. CBG: Recent Labs  Lab 04/29/23 0733 04/29/23 1124 04/29/23 1631 04/30/23 0721 04/30/23 1133  GLUCAP 100* 92 110* 95 113*   Lipid Profile: No results for input(s): "CHOL", "HDL", "LDLCALC", "TRIG", "CHOLHDL", "LDLDIRECT" in the last 72 hours. Thyroid Function Tests: No results for input(s): "TSH", "T4TOTAL", "FREET4", "T3FREE", "THYROIDAB" in the last 72 hours. Anemia Panel: No results for input(s): "VITAMINB12", "FOLATE", "FERRITIN", "TIBC", "IRON", "RETICCTPCT" in the last 72 hours. Sepsis Labs: No results for input(s): "PROCALCITON", "LATICACIDVEN" in the last 168 hours.  Recent Results (from the past 240 hour(s))  Blood Culture (routine x 2)     Status: Abnormal   Collection Time: 04/21/23  2:02 AM   Specimen: BLOOD  Result Value Ref Range Status   Specimen Description   Final    BLOOD RIGHT ARM Performed at [facility], [address], [place], [place] [address]    Special Requests   Final    BOTTLES DRAWN AEROBIC AND ANAEROBIC Blood Culture adequate  volume Performed at [facility], [address] [place], [place] [address]    Culture  Setup Time   Final    GRAM POSITIVE COCCI IN BOTH AEROBIC AND ANAEROBIC BOTTLES CRITICAL VALUE NOTED.  VALUE IS CONSISTENT WITH PREVIOUSLY REPORTED AND CALLED VALUE. Performed at [facility], [address], [place], [place] [address]    Culture (A)  Final    STAPHYLOCOCCUS CAPITIS STAPHYLOCOCCUS AURICULARIS    Report Status  04/26/2023 FINAL  Final   Organism ID, Bacteria STAPHYLOCOCCUS CAPITIS  Final   Organism ID, Bacteria STAPHYLOCOCCUS AURICULARIS  Final      Susceptibility   Staphylococcus auricularis - MIC*    CIPROFLOXACIN 4 RESISTANT Resistant     ERYTHROMYCIN >=8 RESISTANT Resistant     GENTAMICIN <=0.5 SENSITIVE Sensitive     OXACILLIN >=4 RESISTANT Resistant     TETRACYCLINE <=1 SENSITIVE Sensitive     VANCOMYCIN <=0.5 SENSITIVE Sensitive     TRIMETH/SULFA <=10 SENSITIVE Sensitive     CLINDAMYCIN RESISTANT Resistant     RIFAMPIN <=0.5 SENSITIVE Sensitive     Inducible Clindamycin POSITIVE Resistant     * STAPHYLOCOCCUS AURICULARIS   Staphylococcus capitis - MIC*    CIPROFLOXACIN 4 RESISTANT Resistant     ERYTHROMYCIN >=8 RESISTANT Resistant     GENTAMICIN <=0.5 SENSITIVE Sensitive     OXACILLIN >=4 RESISTANT Resistant     TETRACYCLINE >=16 RESISTANT Resistant     VANCOMYCIN <=0.5 SENSITIVE Sensitive     TRIMETH/SULFA <=10 SENSITIVE Sensitive     CLINDAMYCIN INTERMEDIATE Intermediate     RIFAMPIN 1 SENSITIVE Sensitive     Inducible Clindamycin NEGATIVE Sensitive     * STAPHYLOCOCCUS CAPITIS  SARS Coronavirus 2 by RT PCR (hospital order, performed in [facility] hospital lab) *cepheid single result test* Anterior Nasal Swab     Status: None   Collection Time: 04/21/23  2:18 AM   Specimen: Anterior Nasal Swab  Result Value Ref Range Status   SARS Coronavirus 2 by RT PCR NEGATIVE NEGATIVE Final    Comment: (NOTE) SARS-CoV-2 target nucleic acids are NOT  DETECTED.  The SARS-CoV-2 RNA is generally detectable in upper and lower respiratory specimens during the acute phase of infection. The lowest concentration of SARS-CoV-2 viral copies this assay can detect is 250 copies / mL. A negative result does not preclude SARS-CoV-2 infection and should not be used as the sole basis for treatment or other patient management decisions.  A negative result may occur with improper specimen collection / handling, submission of specimen other than nasopharyngeal swab, presence of viral mutation(s) within the areas targeted by this assay, and inadequate number of viral copies (<250 copies / mL). A negative result must be combined with clinical observations, patient history, and epidemiological information.  Fact Sheet for Patients:   RoadLapTop.co.za  Fact Sheet for Healthcare Providers: http://kim-miller.com/  This test is not yet approved or  cleared by the [place] FDA and has been authorized for detection and/or diagnosis of SARS-CoV-2 by FDA under an Emergency Use Authorization (EUA).  This EUA will remain in effect (meaning this test can be used) for the duration of the COVID-19 declaration under Section 564(b)(1) of the Act, 21 U.S.C. section 360bbb-3(b)(1), unless the authorization is terminated or revoked sooner.  Performed at [facility], [address] [place], [place] [address]   Blood Culture (routine x 2)     Status: Abnormal   Collection Time: 04/21/23  2:36 AM   Specimen: BLOOD  Result Value Ref Range Status   Specimen Description   Final    BLOOD  LEFT [facility] Performed at [facility], [address] [place], [place] [address]    Special Requests   Final    BOTTLES DRAWN AEROBIC AND ANAEROBIC Blood Culture adequate volume Performed at [facility], [address], [place], [place] [address]    Culture  Setup Time   Final    [facility] POSITIVE  COCCI  AEROBIC BOTTLE ONLY CRITICAL RESULT CALLED TO, READ BACK BY AND VERIFIED WITH: JASON ROBINS AT 2213 ON 04/21/23 BY SS    Culture (A)  Final    STAPHYLOCOCCUS CAPITIS UNABLE TO RECOVER SEPI IN CULTURE Performed at [facility], [address], [place], [place] [address]    Report Status 04/26/2023 FINAL  Final   Organism ID, Bacteria STAPHYLOCOCCUS CAPITIS  Final      Susceptibility   Staphylococcus capitis - MIC*    CIPROFLOXACIN <=0.5 SENSITIVE Sensitive     ERYTHROMYCIN >=8 RESISTANT Resistant     GENTAMICIN <=0.5 SENSITIVE Sensitive     OXACILLIN <=0.25 SENSITIVE Sensitive     TETRACYCLINE >=16 RESISTANT Resistant     VANCOMYCIN <=0.5 SENSITIVE Sensitive     TRIMETH/SULFA <=10 SENSITIVE Sensitive     CLINDAMYCIN RESISTANT Resistant     RIFAMPIN <=0.5 SENSITIVE Sensitive     Inducible Clindamycin POSITIVE Resistant     * STAPHYLOCOCCUS CAPITIS  Blood Culture ID Panel (Reflexed)     Status: Abnormal   Collection Time: 04/21/23  2:36 AM  Result Value Ref Range Status   Enterococcus faecalis NOT DETECTED NOT DETECTED Final   Enterococcus Faecium NOT DETECTED NOT DETECTED Final   Listeria monocytogenes NOT DETECTED NOT DETECTED Final   Staphylococcus species DETECTED (A) NOT DETECTED Final    Comment: CRITICAL RESULT CALLED TO, READ BACK BY AND VERIFIED WITH: JASON ROBINS AT 2213 ON 04/21/23 BY SS    Staphylococcus aureus (BCID) NOT DETECTED NOT DETECTED Final   Staphylococcus epidermidis DETECTED (A) NOT DETECTED Final    Comment: Methicillin (oxacillin) resistant coagulase negative staphylococcus. Possible blood culture contaminant (unless isolated from more than one blood culture draw or clinical case suggests pathogenicity). No antibiotic treatment is indicated for blood  culture contaminants. CRITICAL RESULT CALLED TO, READ BACK BY AND VERIFIED WITH: JASON ROBINS AT 2213 ON 04/21/23 BY SS    Staphylococcus lugdunensis NOT DETECTED NOT DETECTED Final    Streptococcus species NOT DETECTED NOT DETECTED Final   Streptococcus agalactiae NOT DETECTED NOT DETECTED Final   Streptococcus pneumoniae NOT DETECTED NOT DETECTED Final   Streptococcus pyogenes NOT DETECTED NOT DETECTED Final   A.calcoaceticus-baumannii NOT DETECTED NOT DETECTED Final   Bacteroides fragilis NOT DETECTED NOT DETECTED Final   Enterobacterales NOT DETECTED NOT DETECTED Final   Enterobacter cloacae complex NOT DETECTED NOT DETECTED Final   Escherichia coli NOT DETECTED NOT DETECTED Final   Klebsiella aerogenes NOT DETECTED NOT DETECTED Final   Klebsiella oxytoca NOT DETECTED NOT DETECTED Final   Klebsiella pneumoniae NOT DETECTED NOT DETECTED Final   Proteus species NOT DETECTED NOT DETECTED Final   Salmonella species NOT DETECTED NOT DETECTED Final   Serratia marcescens NOT DETECTED NOT DETECTED Final   Haemophilus influenzae NOT DETECTED NOT DETECTED Final   Neisseria meningitidis NOT DETECTED NOT DETECTED Final   Pseudomonas aeruginosa NOT DETECTED NOT DETECTED Final   Stenotrophomonas maltophilia NOT DETECTED NOT DETECTED Final   Candida albicans NOT DETECTED NOT DETECTED Final   Candida auris NOT DETECTED NOT DETECTED Final   Candida glabrata NOT DETECTED NOT DETECTED Final   Candida krusei NOT DETECTED NOT DETECTED Final   Candida parapsilosis NOT DETECTED NOT DETECTED Final   Candida tropicalis NOT DETECTED NOT DETECTED Final   Cryptococcus neoformans/gattii NOT DETECTED NOT DETECTED Final   Methicillin resistance mecA/C DETECTED (A) NOT DETECTED Final    Comment: CRITICAL RESULT CALLED TO, READ BACK BY AND VERIFIED WITH: JASON ROBINS AT 2213 ON  04/21/23 BY SS Performed at [facility], [address] [place], [place] [address]   Urine Culture (for pregnant, neutropenic or urologic patients or patients with an indwelling urinary catheter)     Status: None   Collection Time: 04/22/23  5:22 PM   Specimen: Urine, Clean Catch  Result Value Ref Range  Status   Specimen Description   Final    URINE, CLEAN CATCH Performed at [facility], [address], [place], [place] [address]    Special Requests   Final    NONE Performed at [facility], [address], [place], [place] [address]    Culture   Final    NO GROWTH Performed at [facility], [address] [place], [place] [address]    Report Status 04/23/2023 FINAL  Final  Culture, blood (Routine X 2) w Reflex to ID Panel     Status: Abnormal   Collection Time: 04/23/23  6:10 AM   Specimen: BLOOD  Result Value Ref Range Status   Specimen Description   Final    BLOOD  LEFT HAND Performed at [facility], [address], [place], [place] [address]    Special Requests   Final    BOTTLES DRAWN AEROBIC ONLY Blood Culture results may not be optimal due to an inadequate volume of blood received in culture bottles Performed at [facility], [address] [place], [place] [address]    Culture  Setup Time   Final    GRAM POSITIVE COCCI AEROBIC BOTTLE ONLY CRITICAL RESULT CALLED TO, READ BACK BY AND VERIFIED WITH: DEVON MITCHELL 04/24/2023 1422 CP Performed at [facility], [address] [place], [place] [address]    Culture STAPHYLOCOCCUS AURICULARIS (A)  Final   Report Status 04/30/2023 FINAL  Final   Organism ID, Bacteria STAPHYLOCOCCUS AURICULARIS  Final      Susceptibility   Staphylococcus auricularis - MIC*    CIPROFLOXACIN >=8 RESISTANT Resistant     ERYTHROMYCIN 4 INTERMEDIATE Intermediate     GENTAMICIN <=0.5 SENSITIVE Sensitive     OXACILLIN >=4 RESISTANT Resistant     TETRACYCLINE >=16 RESISTANT Resistant     VANCOMYCIN 1 SENSITIVE Sensitive     TRIMETH/SULFA <=10 SENSITIVE Sensitive     CLINDAMYCIN RESISTANT Resistant     RIFAMPIN <=0.5 SENSITIVE Sensitive     Inducible Clindamycin POSITIVE Resistant     * STAPHYLOCOCCUS AURICULARIS  Culture, blood (Routine X 2) w Reflex to ID Panel     Status: None    Collection Time: 04/23/23  6:21 AM   Specimen: BLOOD  Result Value Ref Range Status   Specimen Description BLOOD  RIGHT HAND  Final   Special Requests   Final    BOTTLES DRAWN AEROBIC AND ANAEROBIC Blood Culture adequate volume   Culture   Final    NO GROWTH 5 DAYS Performed at [facility], [address] [place], [place] [address]    Report Status 04/28/2023 FINAL  Final  Culture, blood (Routine X 2) w Reflex to ID Panel     Status: None (Preliminary result)   Collection Time: 04/28/23  7:05 AM   Specimen: BLOOD  Result Value Ref Range Status   Specimen Description BLOOD  RIGHT ARM  Final   Special Requests   Final    BOTTLES DRAWN AEROBIC AND ANAEROBIC Blood Culture adequate volume   Culture   Final    NO GROWTH 2 DAYS Performed at [facility],  [address] [place], [place] [address]    Report Status PENDING  Incomplete  Culture, blood (Routine X 2) w Reflex to ID Panel     Status: None (Preliminary result)   Collection Time: 04/28/23  7:05 AM   Specimen: BLOOD  Result Value Ref Range Status   Specimen Description BLOOD  LEFT AC  Final   Special Requests   Final    BOTTLES DRAWN AEROBIC AND ANAEROBIC Blood Culture results may not be optimal due to an excessive volume of blood received in culture bottles   Culture  Setup Time PENDING  Incomplete   Culture   Final    NO GROWTH 2 DAYS Performed at [facility], [address] [place], [place] [address]    Report Status PENDING  Incomplete  Gastrointestinal Panel by PCR , Stool     Status: None   Collection Time: 04/30/23  5:50 AM   Specimen: Stool  Result Value Ref Range Status   Campylobacter species NOT DETECTED NOT DETECTED Final   Plesimonas shigelloides NOT DETECTED NOT DETECTED Final   Salmonella species NOT DETECTED NOT DETECTED Final   Yersinia enterocolitica NOT DETECTED NOT DETECTED Final   Vibrio species NOT DETECTED NOT DETECTED Final   Vibrio cholerae NOT DETECTED NOT  DETECTED Final   Enteroaggregative E coli (EAEC) NOT DETECTED NOT DETECTED Final   Enteropathogenic E coli (EPEC) NOT DETECTED NOT DETECTED Final   Enterotoxigenic E coli (ETEC) NOT DETECTED NOT DETECTED Final   Shiga like toxin producing E coli (STEC) NOT DETECTED NOT DETECTED Final   Shigella/Enteroinvasive E coli (EIEC) NOT DETECTED NOT DETECTED Final   Cryptosporidium NOT DETECTED NOT DETECTED Final   Cyclospora cayetanensis NOT DETECTED NOT DETECTED Final   Entamoeba histolytica NOT DETECTED NOT DETECTED Final   Giardia lamblia NOT DETECTED NOT DETECTED Final   Adenovirus F40/41 NOT DETECTED NOT DETECTED Final   Astrovirus NOT DETECTED NOT DETECTED Final   Norovirus GI/GII NOT DETECTED NOT DETECTED Final   Rotavirus A NOT DETECTED NOT DETECTED Final   Sapovirus (I, II, IV, and V) NOT DETECTED NOT DETECTED Final    Comment: Performed at [facility], [address], [place], [place] [address]         Radiology Studies: No results found.      Scheduled Meds:  vitamin C  500 mg Oral BID   Chlorhexidine Gluconate Cloth  6 each Topical Daily   copper  8 mg Oral Daily   feeding supplement (KATE FARMS STANDARD 1.4)  325 mL Oral TID BM   feeding supplement (PROSource TF20)  60 mL Per Tube QID   folic acid  1 mg Oral Daily   heparin injection (subcutaneous)  5,000 Units Subcutaneous Q8H   hydrocortisone sod succinate (SOLU-CORTEF) inj  100 mg Intravenous Daily   insulin aspart  0-15 Units Subcutaneous TID WC   lactase  6,000 Units Oral TID WC   levothyroxine  100 mcg Oral Q0600   lipase/protease/amylase  24,000 Units Oral TID AC   midodrine  10 mg Oral TID WC   mirtazapine  15 mg Oral QHS   multivitamin  15 mL Oral Daily   pantoprazole (PROTONIX) IV  40 mg Intravenous Q24H   rifaximin  550 mg Oral TID   thiamine  100 mg Oral Daily   vancomycin variable dose per unstable renal function (pharmacist dosing)   Does not apply See admin instructions   vitamin A   10,000 Units Oral Daily  Vitamin D (Ergocalciferol)  50,000 Units Oral Q7 days   vitamin E  400 Units Oral Daily   zinc sulfate  220 mg Oral Daily   Continuous Infusions:  albumin human 12.5 g (04/30/23 1006)     LOS: 9 days   Tresa Moore, MD [facility]   If 7PM-7AM, please contact night-coverage  04/30/2023, 3:32 PM

## 2023-05-01 DIAGNOSIS — E039 Hypothyroidism, unspecified: Secondary | ICD-10-CM | POA: Diagnosis not present

## 2023-05-01 LAB — MULTIPLE MYELOMA PANEL, SERUM
Albumin SerPl Elph-Mcnc: 2.3 g/dL — ABNORMAL LOW (ref 2.9–4.4)
Albumin/Glob SerPl: 0.9 (ref 0.7–1.7)
Alpha 1: 0.1 g/dL (ref 0.0–0.4)
Alpha2 Glob SerPl Elph-Mcnc: 0.6 g/dL (ref 0.4–1.0)
B-Globulin SerPl Elph-Mcnc: 0.6 g/dL — ABNORMAL LOW (ref 0.7–1.3)
Gamma Glob SerPl Elph-Mcnc: 1.3 g/dL (ref 0.4–1.8)
Globulin, Total: 2.7 g/dL (ref 2.2–3.9)
IgA: 639 mg/dL — ABNORMAL HIGH (ref 90–386)
IgG (Immunoglobin G), Serum: 1194 mg/dL (ref 603–1613)
IgM (Immunoglobulin M), Srm: 106 mg/dL (ref 20–172)
Total Protein ELP: 5 g/dL — ABNORMAL LOW (ref 6.0–8.5)

## 2023-05-01 LAB — RENAL FUNCTION PANEL
Albumin: 2.5 g/dL — ABNORMAL LOW (ref 3.5–5.0)
Anion gap: 10 (ref 5–15)
BUN: 50 mg/dL — ABNORMAL HIGH (ref 6–20)
CO2: 16 mmol/L — ABNORMAL LOW (ref 22–32)
Calcium: 8.4 mg/dL — ABNORMAL LOW (ref 8.9–10.3)
Chloride: 106 mmol/L (ref 98–111)
Creatinine, Ser: 1.91 mg/dL — ABNORMAL HIGH (ref 0.61–1.24)
GFR, Estimated: 44 mL/min — ABNORMAL LOW (ref >60)
Glucose, Bld: 107 mg/dL — ABNORMAL HIGH (ref 70–99)
Phosphorus: 3.1 mg/dL (ref 2.5–4.6)
Potassium: 3.6 mmol/L (ref 3.5–5.1)
Sodium: 132 mmol/L — ABNORMAL LOW (ref 135–145)

## 2023-05-01 LAB — CBC WITH DIFFERENTIAL/PLATELET
Abs Immature Granulocytes: 0.61 10*3/uL — ABNORMAL HIGH (ref 0.00–0.07)
Basophils Absolute: 0.1 10*3/uL (ref 0.0–0.1)
Basophils Relative: 0 %
Eosinophils Absolute: 0.1 10*3/uL (ref 0.0–0.5)
Eosinophils Relative: 0 %
HCT: 25.4 % — ABNORMAL LOW (ref 39.0–52.0)
Hemoglobin: 8.5 g/dL — ABNORMAL LOW (ref 13.0–17.0)
Immature Granulocytes: 2 %
Lymphocytes Relative: 6 %
Lymphs Abs: 2 10*3/uL (ref 0.7–4.0)
MCH: 27.6 pg (ref 26.0–34.0)
MCHC: 33.5 g/dL (ref 30.0–36.0)
MCV: 82.5 fL (ref 80.0–100.0)
Monocytes Absolute: 0.9 10*3/uL (ref 0.1–1.0)
Monocytes Relative: 3 %
Neutro Abs: 27.7 10*3/uL — ABNORMAL HIGH (ref 1.7–7.7)
Neutrophils Relative %: 89 %
Platelets: 353 10*3/uL (ref 150–400)
RBC: 3.08 MIL/uL — ABNORMAL LOW (ref 4.22–5.81)
RDW: 20.6 % — ABNORMAL HIGH (ref 11.5–15.5)
Smear Review: NORMAL
WBC: 31.3 10*3/uL — ABNORMAL HIGH (ref 4.0–10.5)
nRBC: 0 % (ref 0.0–0.2)

## 2023-05-01 LAB — PHOSPHORUS: Phosphorus: 3 mg/dL (ref 2.5–4.6)

## 2023-05-01 LAB — VANCOMYCIN, RANDOM: Vancomycin Rm: 38 ug/mL

## 2023-05-01 LAB — GLUCOSE, CAPILLARY
Glucose-Capillary: 101 mg/dL — ABNORMAL HIGH (ref 70–99)
Glucose-Capillary: 104 mg/dL — ABNORMAL HIGH (ref 70–99)
Glucose-Capillary: 134 mg/dL — ABNORMAL HIGH (ref 70–99)

## 2023-05-01 LAB — CULTURE, BLOOD (ROUTINE X 2)

## 2023-05-01 MED ORDER — HYDROCORTISONE SOD SUC (PF) 100 MG IJ SOLR
50.0000 mg | Freq: Every day | INTRAMUSCULAR | Status: AC
  Administered 2023-05-02 – 2023-05-04 (×3): 50 mg via INTRAVENOUS
  Filled 2023-05-01 (×3): qty 1

## 2023-05-01 MED ORDER — SODIUM BICARBONATE 650 MG PO TABS
650.0000 mg | ORAL_TABLET | Freq: Three times a day (TID) | ORAL | Status: DC
  Administered 2023-05-01 – 2023-05-02 (×4): 650 mg via ORAL
  Filled 2023-05-01 (×4): qty 1

## 2023-05-01 NOTE — Plan of Care (Signed)

## 2023-05-01 NOTE — Progress Notes (Signed)
[facility], [place] 05/01/23  Subjective:   Hospital day # 10  Patient complains of nausea today Decreased oral intake   Renal: 05/02 0701 - 05/03 0700 In: 549 [P.O.:480; IV Piggyback:69] Out: -  Lab Results  Component Value Date   CREATININE 1.91 (H) 05/01/2023   CREATININE 1.61 (H) 04/30/2023   CREATININE 1.56 (H) 04/29/2023     Objective:  Vital signs in last 24 hours:  Temp:  [97.6 F (36.4 C)-98.6 F (37 C)] 97.6 F (36.4 C) (05/03 1125) Pulse Rate:  [80-131] 129 (05/03 1125) Resp:  [11-17] 16 (05/03 1125) BP: (100-108)/(73-86) 107/73 (05/03 1125) SpO2:  [99 %-100 %] 100 % (05/03 1125)  Weight change:  Filed Weights   04/28/23 0321 04/29/23 0500 04/30/23 0500  Weight: (!) 172.2 kg (!) 169 kg (!) 169.4 kg    Intake/Output:    Intake/Output Summary (Last 24 hours) at 05/01/2023 1329 Last data filed at 05/01/2023 1000 Gross per 24 hour  Intake 250.84 ml  Output --  Net 250.84 ml      Physical Exam: General: Super morbidly obese gentleman, laying in the bed  HEENT Moist oral mucous membranes  Pulm/lungs Normal breathing effort room air  CVS/Heart No rub  Abdomen:  Obese, nontender  Extremities: Significant dependent peripheral edema on the thighs  Neurologic: Alert, oriented  Skin: Lichenified skin over calves and lower thighs  Dialysis Access: None at present       Basic Metabolic Panel:  Recent Labs  Lab 04/27/23 0623 04/27/23 0907 04/28/23 0703 04/29/23 0602 04/30/23 0450 05/01/23 0535  NA  --  129* 129* 130* 130* 132*  K  --  4.5 4.4 4.0 3.6 3.6  CL  --  102 103 105 103 106  CO2  --  16* 15* 17* 16* 16*  GLUCOSE  --  95 99 100* 94 107*  BUN  --  37* 40* 44* 46* 50*  CREATININE  --  1.40* 1.49* 1.56* 1.61* 1.91*  CALCIUM  --  8.0* 8.2* 8.1* 8.2* 8.4*  PHOS 2.7  --  3.1  3.0 3.1 3.2 3.1  3.0      CBC: Recent Labs  Lab 04/27/23 0623 04/27/23 1831 04/28/23 0703 04/29/23 0602 04/30/23 0450  05/01/23 0535  WBC 27.1* 25.3* 30.0* 21.2* 24.1* 31.3*  NEUTROABS 21.0*  --  24.2* 16.9* 20.2* 27.7*  HGB 7.2* 8.7* 9.1* 8.5* 8.8* 8.5*  HCT 22.2* 27.1* 28.4* 25.8* 26.5* 25.4*  MCV 85.1 85.0 84.0 82.7 82.6 82.5  PLT 440* 369 501* 400 393 353      No results found for: "HEPBSAG", "HEPBSAB", "HEPBIGM"    Microbiology:  Recent Results (from the past 240 hour(s))  Urine Culture (for pregnant, neutropenic or urologic patients or patients with an indwelling urinary catheter)     Status: None   Collection Time: 04/22/23  5:22 PM   Specimen: Urine, Clean Catch  Result Value Ref Range Status   Specimen Description   Final    URINE, CLEAN CATCH Performed at [facility], [address] [place], [place] [address]    Special Requests   Final    NONE Performed at [facility], [address] [place], [place] [address]    Culture   Final    NO GROWTH Performed at [facility], [address], [place], [place] [address]    Report Status 04/23/2023 FINAL  Final  Culture, blood (Routine X 2) w Reflex to ID Panel  Status: Abnormal   Collection Time: 04/23/23  6:10 AM   Specimen: BLOOD  Result Value Ref Range Status   Specimen Description   Final    BLOOD  LEFT HAND Performed at [facility], [address] [place], [place] [address]    Special Requests   Final    BOTTLES DRAWN AEROBIC ONLY Blood Culture results may not be optimal due to an inadequate volume of blood received in culture bottles Performed at [facility], [address] [place], [place] [address]    Culture  Setup Time   Final    GRAM POSITIVE COCCI AEROBIC BOTTLE ONLY CRITICAL RESULT CALLED TO, READ BACK BY AND VERIFIED WITH: DEVON MITCHELL 04/24/2023 1422 CP Performed at [facility], [address] [place], [place] [address]    Culture STAPHYLOCOCCUS AURICULARIS (A)  Final   Report Status 04/30/2023 FINAL  Final   Organism ID, Bacteria  STAPHYLOCOCCUS AURICULARIS  Final      Susceptibility   Staphylococcus auricularis - MIC*    CIPROFLOXACIN >=8 RESISTANT Resistant     ERYTHROMYCIN 4 INTERMEDIATE Intermediate     GENTAMICIN <=0.5 SENSITIVE Sensitive     OXACILLIN >=4 RESISTANT Resistant     TETRACYCLINE >=16 RESISTANT Resistant     VANCOMYCIN 1 SENSITIVE Sensitive     TRIMETH/SULFA <=10 SENSITIVE Sensitive     CLINDAMYCIN RESISTANT Resistant     RIFAMPIN <=0.5 SENSITIVE Sensitive     Inducible Clindamycin POSITIVE Resistant     * STAPHYLOCOCCUS AURICULARIS  Culture, blood (Routine X 2) w Reflex to ID Panel     Status: None   Collection Time: 04/23/23  6:21 AM   Specimen: BLOOD  Result Value Ref Range Status   Specimen Description BLOOD  RIGHT HAND  Final   Special Requests   Final    BOTTLES DRAWN AEROBIC AND ANAEROBIC Blood Culture adequate volume   Culture   Final    NO GROWTH 5 DAYS Performed at [facility], [address] [place], [place] [address]    Report Status 04/28/2023 FINAL  Final  Culture, blood (Routine X 2) w Reflex to ID Panel     Status: None (Preliminary result)   Collection Time: 04/28/23  7:05 AM   Specimen: BLOOD  Result Value Ref Range Status   Specimen Description BLOOD  RIGHT ARM  Final   Special Requests   Final    BOTTLES DRAWN AEROBIC AND ANAEROBIC Blood Culture adequate volume   Culture   Final    NO GROWTH 3 DAYS Performed at [facility], [address], [place], [place] [address]    Report Status PENDING  Incomplete  Culture, blood (Routine X 2) w Reflex to ID Panel     Status: None (Preliminary result)   Collection Time: 04/28/23  7:05 AM   Specimen: BLOOD  Result Value Ref Range Status   Specimen Description BLOOD  LEFT AC  Final   Special Requests   Final    BOTTLES DRAWN AEROBIC AND ANAEROBIC Blood Culture results may not be optimal due to an excessive volume of blood received in culture bottles   Culture  Setup Time PENDING  Incomplete   Culture    Final    NO GROWTH 3 DAYS Performed at [facility], [address], [place], [place] [address]    Report Status PENDING  Incomplete  Gastrointestinal Panel by PCR , Stool     Status: None   Collection Time: 04/30/23  5:50 AM   Specimen: Stool  Result Value Ref Range Status   Campylobacter species NOT DETECTED NOT DETECTED Final   Plesimonas shigelloides NOT DETECTED NOT DETECTED Final   Salmonella species NOT DETECTED NOT DETECTED Final   Yersinia enterocolitica NOT DETECTED NOT DETECTED Final   Vibrio species NOT DETECTED NOT DETECTED Final   Vibrio cholerae NOT DETECTED NOT DETECTED Final   Enteroaggregative E coli (EAEC) NOT DETECTED NOT DETECTED Final   Enteropathogenic E coli (EPEC) NOT DETECTED NOT DETECTED Final   Enterotoxigenic E coli (ETEC) NOT DETECTED NOT DETECTED Final   Shiga like toxin producing E coli (STEC) NOT DETECTED NOT DETECTED Final   Shigella/Enteroinvasive E coli (EIEC) NOT DETECTED NOT DETECTED Final   Cryptosporidium NOT DETECTED NOT DETECTED Final   Cyclospora cayetanensis NOT DETECTED NOT DETECTED Final   Entamoeba histolytica NOT DETECTED NOT DETECTED Final   Giardia lamblia NOT DETECTED NOT DETECTED Final   Adenovirus F40/41 NOT DETECTED NOT DETECTED Final   Astrovirus NOT DETECTED NOT DETECTED Final   Norovirus GI/GII NOT DETECTED NOT DETECTED Final   Rotavirus A NOT DETECTED NOT DETECTED Final   Sapovirus (I, II, IV, and V) NOT DETECTED NOT DETECTED Final    Comment: Performed at [facility], [address] [place], [place] [address]    Coagulation Studies: No results for input(s): "LABPROT", "INR" in the last 72 hours.  Urinalysis: No results for input(s): "COLORURINE", "LABSPEC", "PHURINE", "GLUCOSEU", "HGBUR", "BILIRUBINUR", "KETONESUR", "PROTEINUR", "UROBILINOGEN", "NITRITE", "LEUKOCYTESUR" in the last 72 hours.  Invalid input(s): "APPERANCEUR"    Imaging: No results found.   Medications:    albumin human 12.5  g (05/01/23 1100)    vitamin C  500 mg Oral BID   Chlorhexidine Gluconate Cloth  6 each Topical Daily   copper  8 mg Oral Daily   feeding supplement (KATE FARMS STANDARD 1.4)  325 mL Oral TID BM   feeding supplement (PROSource TF20)  60 mL Per Tube QID   folic acid  1 mg Oral Daily   heparin injection (subcutaneous)  5,000 Units Subcutaneous Q8H   [START ON 05/02/2023] hydrocortisone sod succinate (SOLU-CORTEF) inj  50 mg Intravenous Daily   insulin aspart  0-15 Units Subcutaneous TID WC   lactase  6,000 Units Oral TID WC   levothyroxine  100 mcg Oral Q0600   lipase/protease/amylase  24,000 Units Oral TID AC   midodrine  10 mg Oral TID WC   mirtazapine  15 mg Oral QHS   multivitamin  15 mL Oral Daily   pantoprazole (PROTONIX) IV  40 mg Intravenous Q24H   rifaximin  550 mg Oral TID   sodium bicarbonate  650 mg Oral TID   thiamine  100 mg Oral Daily   vitamin A  10,000 Units Oral Daily   Vitamin D (Ergocalciferol)  50,000 Units Oral Q7 days   vitamin E  400 Units Oral Daily   zinc sulfate  220 mg Oral Daily   acetaminophen, bismuth subsalicylate, loperamide, polyethylene glycol  Assessment/ Plan:  47 y.o. male with super morbid obesity, hypertension, obstructive sleep apnea, lymphedema, history of bariatric surgery, cholelithiasis, severe hypothyroidism, bedridden status, history of IVC filter placement admitted on 04/21/2023 for Severe hypothyroidism [E03.9] Generalized weakness [R53.1] Lower limb ulcer, ankle, right, with unspecified severity (HCC) [L97.319] Hypothyroidism, unspecified type [E03.9] Sepsis, due to unspecified organism, unspecified whether acute organ dysfunction present (HCC) [A41.9]   Acute kidney injury Likely secondary to hemodynamic alterations caused by septic shock and hypotension and possibly  Vancomycin toxicity leading to ATN. Patient is nonoliguric. Creatinine 1.9 today, unreliable urine output. No acute need for dialysis but monitoring closely.    Generalized edema Patient has hypoalbuminemia and malnutrition. Daily IV albumin 12.5g for fluid mobilization. Consuming protein supplementation   Hyponatremia Likely secondary to AKI and generalized volume overload. Sodium 132  Hypotension, received pressor support in ICU.  Continue midodrine 10mg  TID. Blood pressure 100/80  Chronic systolic CHF LVEF 35 to 40%, moderately decreased function, global hypokinesis, grade 1 diastolic dysfunction, normal right ventricular systolic function from echo 04/22/2023.  Acute metabolic acidosis, serum bicarb 16. Will order oral sodium bicarbonate 650mg  TID.     LOS: 10 Wendee Beavers 5/3/20241:29 PM  [facility] [address] [place], [place] [phone]

## 2023-05-01 NOTE — Progress Notes (Signed)
Occupational Therapy Treatment Patient Details Name: Christopher Herman MRN: [id number] DOB: Dec 13, 1976 Today's Date: 05/01/2023   History of present illness 47 y/o male presented to ED on 04/21/23 for weakness, extreme fatigue, unintended weight loss, disuse of UE and poor P.O. intake. Admitted for sepsis 2/2 unknown etiology and severe hypothyroidism. PMH: severe obesity s/p bariatric surgery, OSA, hypoventilation syndrome, HTN, cholelithiasis, bilateral extremity lipoma, chronic lymphedema s/p IVC filter placement, IDA   OT comments  Christopher Herman was seen for OT treatment on this date. Upon arrival to room pt reclined in bed, agreeable to tx. Pt requires assistance for self-feeding, SETUP and B hands for cups. Provided yarn and crochet hook - completed initial 2 chains with cues and significantly increased time. Reports plan to continue for home program. Pt making progress toward goals, will continue to follow POC. Discharge recommendation remains appropriate.     Recommendations for follow up therapy are one component of a multi-disciplinary discharge planning process, led by the attending physician.  Recommendations may be updated based on patient status, additional functional criteria and insurance authorization.    Assistance Recommended at Discharge Frequent or constant Supervision/Assistance  Patient can return home with the following  Two people to help with bathing/dressing/bathroom;Two people to help with walking and/or transfers;Assistance with cooking/housework;Assist for transportation;Help with stairs or ramp for entrance   Equipment Recommendations  None recommended by OT    Recommendations for Other Services      Precautions / Restrictions Precautions Precautions: Fall Restrictions Weight Bearing Restrictions: No       Mobility Bed Mobility Overal bed mobility: Needs Assistance             General bed mobility comments: requires +3 for bed mobility at baseline.  Repositioned limbs for comfort    Transfers                   General transfer comment: unable         ADL either performed or assessed with clinical judgement   ADL Overall ADL's : Needs assistance/impaired                                       General ADL Comments: Required assitance for self-feedin this date. Provided yarn and crochet hook - completed initial 2 chains with cues and significantly increased time.      Cognition Arousal/Alertness: Awake/alert Behavior During Therapy: Flat affect, WFL for tasks assessed/performed Overall Cognitive Status: Within Functional Limits for tasks assessed                                                     Pertinent Vitals/ Pain       Pain Assessment Pain Assessment: No/denies pain   Frequency  Min 1X/week        Progress Toward Goals  OT Goals(current goals can now be found in the care plan section)  Progress towards OT goals: Progressing toward goals  Acute Rehab OT Goals Patient Stated Goal: to self-feed OT Goal Formulation: With patient Time For Goal Achievement: 05/12/23 Potential to Achieve Goals: Fair ADL Goals Pt Will Perform Eating: with set-up;with adaptive utensils;bed level  Plan Discharge plan remains appropriate;Frequency remains appropriate       AM-PAC OT "6 Clicks"  Daily Activity     Outcome Measure   Help from another person eating meals?: A Lot Help from another person taking care of personal grooming?: Total Help from another person toileting, which includes using toliet, bedpan, or urinal?: Total Help from another person bathing (including washing, rinsing, drying)?: Total Help from another person to put on and taking off regular upper body clothing?: Total Help from another person to put on and taking off regular lower body clothing?: Total 6 Click Score: 7    End of Session    OT Visit Diagnosis: Muscle weakness (generalized) (M62.81)    Activity Tolerance Patient tolerated treatment well   Patient Left in bed;with call bell/phone within reach   Nurse Communication          Time: 1610-9604 OT Time Calculation (min): 26 min  Charges: OT General Charges $OT Visit: 1 Visit OT Treatments $Self Care/Home Management : 8-22 mins $Therapeutic Activity: 8-22 mins  Kathie Dike, M.S. OTR/L  05/01/23, 3:19 PM  ascom [phone]

## 2023-05-01 NOTE — Consult Note (Signed)
[facility] Face-to-Face Psychiatry Consult   Reason for Consult: Follow-up 47 year old man with multiple medical problems including morbid obesity and hypothyroidism.  Follow-up for concern about depression and anxiety Referring Physician:  Georgeann Oppenheim Patient Identification: Christopher Herman MRN:  [id number] Principal Diagnosis: Severe hypothyroidism Diagnosis:  Principal Problem:   Severe hypothyroidism Active Problems:   Malnutrition of moderate degree (HCC)   Generalized weakness   Ulcer of right ankle (HCC)   Hypotension due to hypovolemia   Acute renal failure due to tubular necrosis (HCC)   Hypothyroidism   Total Time spent with patient: 30 minutes  Subjective:   Christopher Herman is a 47 y.o. male patient admitted with "I am feeling better".  HPI: Follow-up on yesterday's note.  This is a 47 year old man with multiple medical problems brought into the hospital extremely sick.  Concern was raised about possible depression because of his failure to eat well sluggishness and what appeared to be a dysphoric mood.  Yesterday patient when seen adamantly declared that he was not depressed however he was continuing to have a poor appetite and to seem blunted in his affect.  On interview today the patient is more awake and alert and willing to communicate more.  He tells me that he has trouble eating sometimes because he will be afraid of choking his.  He sometimes gets a feeling in his throat like it is difficult to swallow and he gets afraid he will choke and so he does not want to try to eat.  He does not feel like there is a specific blockage in his throat.  Denies suicidal thoughts.  More talkative and open today more complete affect.  Past Psychiatric History: History of longstanding multiple medical problems.  Risk to Self:   Risk to Others:   Prior Inpatient Therapy:   Prior Outpatient Therapy:    Past Medical History:  Past Medical History:  Diagnosis Date   AKI (acute kidney injury)  (HCC)    Cholelithiasis    Chronic acquired lymphedema    GERD (gastroesophageal reflux disease)    Helicobacter pylori infection 09/10/2015   History of sepsis    Leg muscle spasm    Leukocytosis    Lipoma of both lower extremities    Palpitations 05/31/2009   Chronic, stable   Presence of IVC filter    Rhabdomyolysis    Sleep apnea    Tachycardia    Vitamin D deficiency     Past Surgical History:  Procedure Laterality Date   GASTRIC BYPASS  05/23/2012   Family History:  Family History  Problem Relation Age of Onset   Cancer Paternal Grandmother    Cancer Paternal Grandfather    Family Psychiatric  History: See previous Social History:  Social History   Substance and Sexual Activity  Alcohol Use No     Social History   Substance and Sexual Activity  Drug Use No    Social History   Socioeconomic History   Marital status: Single    Spouse name: Not on file   Number of children: 0   Years of education: Not on file   Highest education level: Not on file  Occupational History   Occupation: Employed    Comment: Works as a Ship broker  Tobacco Use   Smoking status: Former   Smokeless tobacco: Never   Tobacco comments:    Quit smoking in 2010, smoked cigarettes, smoked for about 16 years; smoked less than 1/2 pack per day.  Substance and Sexual  Activity   Alcohol use: No   Drug use: No   Sexual activity: Not Currently    Birth control/protection: None  Other Topics Concern   Not on file  Social History Narrative   Not on file   Social Determinants of Health   Financial Resource Strain: Not on file  Food Insecurity: No Food Insecurity (11/04/2022)   Hunger Vital Sign    Worried About Running Out of Food in the Last Year: Never true    Ran Out of Food in the Last Year: Never true  Transportation Needs: No Transportation Needs (11/04/2022)   PRAPARE - Administrator, Civil Service (Medical): No    Lack of Transportation (Non-Medical):  No  Physical Activity: Inactive (11/04/2022)   Exercise Vital Sign    Days of Exercise per Week: 0 days    Minutes of Exercise per Session: 0 min  Stress: Not on file  Social Connections: Not on file   Additional Social History:    Allergies:  No Known Allergies  Labs:  Results for orders placed or performed during the hospital encounter of 04/21/23 (from the past 48 hour(s))  Glucose, capillary     Status: Abnormal   Collection Time: 04/29/23  4:31 PM  Result Value Ref Range   Glucose-Capillary 110 (H) 70 - 99 mg/dL    Comment: Glucose reference range applies only to samples taken after fasting for at least 8 hours.  CBC with Differential/Platelet     Status: Abnormal   Collection Time: 04/30/23  4:50 AM  Result Value Ref Range   WBC 24.1 (H) 4.0 - 10.5 K/uL   RBC 3.21 (L) 4.22 - 5.81 MIL/uL   Hemoglobin 8.8 (L) 13.0 - 17.0 g/dL   HCT 21.3 (L) 08.6 - 57.8 %   MCV 82.6 80.0 - 100.0 fL   MCH 27.4 26.0 - 34.0 pg   MCHC 33.2 30.0 - 36.0 g/dL   RDW 46.9 (H) 62.9 - 52.8 %   Platelets 393 150 - 400 K/uL   nRBC 0.0 0.0 - 0.2 %   Neutrophils Relative % 83 %   Neutro Abs 20.2 (H) 1.7 - 7.7 K/uL   Lymphocytes Relative 9 %   Lymphs Abs 2.1 0.7 - 4.0 K/uL   Monocytes Relative 6 %   Monocytes Absolute 1.4 (H) 0.1 - 1.0 K/uL   Eosinophils Relative 1 %   Eosinophils Absolute 0.1 0.0 - 0.5 K/uL   Basophils Relative 0 %   Basophils Absolute 0.0 0.0 - 0.1 K/uL   Immature Granulocytes 1 %   Abs Immature Granulocytes 0.30 (H) 0.00 - 0.07 K/uL    Comment: Performed at [facility], [address] [place], [place] [address]  Renal function panel     Status: Abnormal   Collection Time: 04/30/23  4:50 AM  Result Value Ref Range   Sodium 130 (L) 135 - 145 mmol/L   Potassium 3.6 3.5 - 5.1 mmol/L   Chloride 103 98 - 111 mmol/L   CO2 16 (L) 22 - 32 mmol/L   Glucose, Bld 94 70 - 99 mg/dL    Comment: Glucose reference range applies only to samples taken after fasting for at least 8  hours.   BUN 46 (H) 6 - 20 mg/dL   Creatinine, Ser 4.01 (H) 0.61 - 1.24 mg/dL   Calcium 8.2 (L) 8.9 - 10.3 mg/dL   Phosphorus 3.2 2.5 - 4.6 mg/dL   Albumin 2.7 (L) 3.5 - 5.0 g/dL  GFR, Estimated 53 (L) >60 mL/min    Comment: (NOTE) Calculated using the CKD-EPI Creatinine Equation (2021)    Anion gap 11 5 - 15    Comment: Performed at [facility], [address] [place], [place] [address]  Gastrointestinal Panel by PCR , Stool     Status: None   Collection Time: 04/30/23  5:50 AM   Specimen: Stool  Result Value Ref Range   Campylobacter species NOT DETECTED NOT DETECTED   Plesimonas shigelloides NOT DETECTED NOT DETECTED   Salmonella species NOT DETECTED NOT DETECTED   Yersinia enterocolitica NOT DETECTED NOT DETECTED   Vibrio species NOT DETECTED NOT DETECTED   Vibrio cholerae NOT DETECTED NOT DETECTED   Enteroaggregative E coli (EAEC) NOT DETECTED NOT DETECTED   Enteropathogenic E coli (EPEC) NOT DETECTED NOT DETECTED   Enterotoxigenic E coli (ETEC) NOT DETECTED NOT DETECTED   Shiga like toxin producing E coli (STEC) NOT DETECTED NOT DETECTED   Shigella/Enteroinvasive E coli (EIEC) NOT DETECTED NOT DETECTED   Cryptosporidium NOT DETECTED NOT DETECTED   Cyclospora cayetanensis NOT DETECTED NOT DETECTED   Entamoeba histolytica NOT DETECTED NOT DETECTED   Giardia lamblia NOT DETECTED NOT DETECTED   Adenovirus F40/41 NOT DETECTED NOT DETECTED   Astrovirus NOT DETECTED NOT DETECTED   Norovirus GI/GII NOT DETECTED NOT DETECTED   Rotavirus A NOT DETECTED NOT DETECTED   Sapovirus (I, II, IV, and V) NOT DETECTED NOT DETECTED    Comment: Performed at [facility], [address] [place], [place] [address]  Glucose, capillary     Status: None   Collection Time: 04/30/23  7:21 AM  Result Value Ref Range   Glucose-Capillary 95 70 - 99 mg/dL    Comment: Glucose reference range applies only to samples taken after fasting for at least 8 hours.  Glucose, capillary      Status: Abnormal   Collection Time: 04/30/23 11:33 AM  Result Value Ref Range   Glucose-Capillary 113 (H) 70 - 99 mg/dL    Comment: Glucose reference range applies only to samples taken after fasting for at least 8 hours.  Glucose, capillary     Status: Abnormal   Collection Time: 04/30/23  5:19 PM  Result Value Ref Range   Glucose-Capillary 134 (H) 70 - 99 mg/dL    Comment: Glucose reference range applies only to samples taken after fasting for at least 8 hours.  CBC with Differential/Platelet     Status: Abnormal   Collection Time: 05/01/23  5:35 AM  Result Value Ref Range   WBC 31.3 (H) 4.0 - 10.5 K/uL   RBC 3.08 (L) 4.22 - 5.81 MIL/uL   Hemoglobin 8.5 (L) 13.0 - 17.0 g/dL   HCT 69.6 (L) 29.5 - 28.4 %   MCV 82.5 80.0 - 100.0 fL   MCH 27.6 26.0 - 34.0 pg   MCHC 33.5 30.0 - 36.0 g/dL   RDW 13.2 (H) 44.0 - 10.2 %   Platelets 353 150 - 400 K/uL   nRBC 0.0 0.0 - 0.2 %   Neutrophils Relative % 89 %   Neutro Abs 27.7 (H) 1.7 - 7.7 K/uL   Lymphocytes Relative 6 %   Lymphs Abs 2.0 0.7 - 4.0 K/uL   Monocytes Relative 3 %   Monocytes Absolute 0.9 0.1 - 1.0 K/uL   Eosinophils Relative 0 %   Eosinophils Absolute 0.1 0.0 - 0.5 K/uL   Basophils Relative 0 %   Basophils Absolute 0.1 0.0 - 0.1 K/uL  WBC Morphology Mild Left Shift (1-5% metas, occ myelo)    RBC Morphology MORPHOLOGY UNREMARKABLE    Smear Review Normal platelet morphology    Immature Granulocytes 2 %   Abs Immature Granulocytes 0.61 (H) 0.00 - 0.07 K/uL    Comment: Performed at [facility], [address] [place], [place] [address]  Phosphorus     Status: None   Collection Time: 05/01/23  5:35 AM  Result Value Ref Range   Phosphorus 3.0 2.5 - 4.6 mg/dL    Comment: Performed at [facility], [address] [place], [place] [address]  Renal function panel     Status: Abnormal   Collection Time: 05/01/23  5:35 AM  Result Value Ref Range   Sodium 132 (L) 135 - 145 mmol/L   Potassium 3.6 3.5 - 5.1  mmol/L   Chloride 106 98 - 111 mmol/L   CO2 16 (L) 22 - 32 mmol/L   Glucose, Bld 107 (H) 70 - 99 mg/dL    Comment: Glucose reference range applies only to samples taken after fasting for at least 8 hours.   BUN 50 (H) 6 - 20 mg/dL   Creatinine, Ser 0.98 (H) 0.61 - 1.24 mg/dL   Calcium 8.4 (L) 8.9 - 10.3 mg/dL   Phosphorus 3.1 2.5 - 4.6 mg/dL   Albumin 2.5 (L) 3.5 - 5.0 g/dL   GFR, Estimated 44 (L) >60 mL/min    Comment: (NOTE) Calculated using the CKD-EPI Creatinine Equation (2021)    Anion gap 10 5 - 15    Comment: Performed at [facility], [address] [place], [place] [address]  Glucose, capillary     Status: Abnormal   Collection Time: 05/01/23  6:34 AM  Result Value Ref Range   Glucose-Capillary 101 (H) 70 - 99 mg/dL    Comment: Glucose reference range applies only to samples taken after fasting for at least 8 hours.  Vancomycin, random     Status: None   Collection Time: 05/01/23  6:45 AM  Result Value Ref Range   Vancomycin Rm 38 ug/mL    Comment:        Random Vancomycin therapeutic range is dependent on dosage and time of specimen collection. A peak range is 20-40 ug/mL A trough range is 5-15 ug/mL        Performed at [facility], [address] [place], [place] [address]   Glucose, capillary     Status: Abnormal   Collection Time: 05/01/23 11:26 AM  Result Value Ref Range   Glucose-Capillary 104 (H) 70 - 99 mg/dL    Comment: Glucose reference range applies only to samples taken after fasting for at least 8 hours.    Current Facility-Administered Medications  Medication Dose Route Frequency Provider Last Rate Last Admin   acetaminophen (TYLENOL) tablet 650 mg  650 mg Oral Q6H PRN Leeroy Bock, MD   650 mg at 04/26/23 1707   albumin human 25 % solution 12.5 g  12.5 g Intravenous Daily Breeze, Gery Pray, NP 60 mL/hr at 05/01/23 1100 12.5 g at 05/01/23 1100   ascorbic acid (VITAMIN C) tablet 500 mg  500 mg Oral BID Erin Fulling, MD    500 mg at 05/01/23 1038   bismuth subsalicylate (PEPTO BISMOL) 262 MG/15ML suspension 30 mL  30 mL Oral TID PRN Leeroy Bock, MD   30 mL at 04/30/23 0552   Chlorhexidine Gluconate Cloth 2 % PADS 6 each  6 each Topical Daily Kasa, Kurian,  MD   6 each at 05/01/23 0730   copper tablet 8 mg  8 mg Oral Daily Jamelle Rushing L, MD   8 mg at 05/01/23 1040   feeding supplement (KATE FARMS STANDARD 1.4) liquid 325 mL  325 mL Oral TID BM Leeroy Bock, MD   60 mL at 04/29/23 1716   feeding supplement (PROSource TF20) liquid 60 mL  60 mL Per Tube QID Lolita Patella B, MD   60 mL at 04/30/23 2210   folic acid (FOLVITE) tablet 1 mg  1 mg Oral Daily Kasa, Kurian, MD   1 mg at 05/01/23 1038   heparin injection 5,000 Units  5,000 Units Subcutaneous Q8H Cordella Register A, RPH   5,000 Units at 05/01/23 1355   [START ON 05/02/2023] hydrocortisone sodium succinate (SOLU-CORTEF) 100 MG injection 50 mg  50 mg Intravenous Daily Sreenath, Sudheer B, MD       insulin aspart (novoLOG) injection 0-15 Units  0-15 Units Subcutaneous TID WC Leeroy Bock, MD   2 Units at 04/30/23 1723   lactase (LACTAID) tablet 6,000 Units  6,000 Units Oral TID WC Leeroy Bock, MD   6,000 Units at 05/01/23 1200   levothyroxine (SYNTHROID) tablet 100 mcg  100 mcg Oral Q0600 Pilar Jarvis, MD   100 mcg at 05/01/23 0538   lipase/protease/amylase (CREON) capsule 24,000 Units  24,000 Units Oral TID Dudley Major, Sudheer B, MD   24,000 Units at 05/01/23 1200   loperamide (IMODIUM) capsule 2 mg  2 mg Oral PRN Leeroy Bock, MD   2 mg at 04/30/23 0757   midodrine (PROAMATINE) tablet 10 mg  10 mg Oral TID WC Erin Fulling, MD   10 mg at 05/01/23 1200   mirtazapine (REMERON) tablet 15 mg  15 mg Oral QHS Draedyn Weidinger T, MD   15 mg at 04/30/23 2210   multivitamin liquid 15 mL  15 mL Oral Daily Erin Fulling, MD   15 mL at 04/30/23 1012   pantoprazole (PROTONIX) injection 40 mg  40 mg Intravenous Q24H Tressie Ellis, RPH    40 mg at 04/30/23 2209   polyethylene glycol (MIRALAX / GLYCOLAX) packet 17 g  17 g Oral Daily PRN Jimmye Norman, NP       rifaximin Burman Blacksmith) tablet 550 mg  550 mg Oral TID Lolita Patella B, MD   550 mg at 05/01/23 1040   sodium bicarbonate tablet 650 mg  650 mg Oral TID Wendee Beavers, NP   650 mg at 05/01/23 1038   thiamine (VITAMIN B1) tablet 100 mg  100 mg Oral Daily Erin Fulling, MD   100 mg at 05/01/23 1038   vitamin A capsule 10,000 Units  10,000 Units Oral Daily Erin Fulling, MD   10,000 Units at 05/01/23 1041   Vitamin D (Ergocalciferol) (DRISDOL) 1.25 MG (50000 UNIT) capsule 50,000 Units  50,000 Units Oral Q7 days Erin Fulling, MD   50,000 Units at 04/30/23 1013   vitamin E capsule 400 Units  400 Units Oral Daily Erin Fulling, MD   400 Units at 05/01/23 1200   zinc sulfate capsule 220 mg  220 mg Oral Daily Erin Fulling, MD   220 mg at 05/01/23 1039    Musculoskeletal: Strength & Muscle Tone: decreased Gait & Station: unable to stand Patient leans: N/A            Psychiatric Specialty Exam:  Presentation  General Appearance: No data recorded Eye Contact:No data recorded Speech:No data recorded Speech  Volume:No data recorded Handedness:No data recorded  Mood and Affect  Mood:No data recorded Affect:No data recorded  Thought Process  Thought Processes:No data recorded Descriptions of Associations:No data recorded Orientation:No data recorded Thought Content:No data recorded History of Schizophrenia/Schizoaffective disorder:No data recorded Duration of Psychotic Symptoms:No data recorded Hallucinations:No data recorded Ideas of Reference:No data recorded Suicidal Thoughts:No data recorded Homicidal Thoughts:No data recorded  Sensorium  Memory:No data recorded Judgment:No data recorded Insight:No data recorded  Executive Functions  Concentration:No data recorded Attention Span:No data recorded Recall:No data recorded Fund of  Knowledge:No data recorded Language:No data recorded  Psychomotor Activity  Psychomotor Activity:No data recorded  Assets  Assets:No data recorded  Sleep  Sleep:No data recorded  Physical Exam: Physical Exam Vitals and nursing note reviewed.  Constitutional:      Appearance: Normal appearance. He is ill-appearing.  HENT:     Head: Normocephalic and atraumatic.     Mouth/Throat:     Pharynx: Oropharynx is clear.  Eyes:     Pupils: Pupils are equal, round, and reactive to light.  Cardiovascular:     Rate and Rhythm: Normal rate and regular rhythm.  Pulmonary:     Effort: Pulmonary effort is normal.     Breath sounds: Normal breath sounds.  Abdominal:     General: Abdomen is flat.     Palpations: Abdomen is soft.  Musculoskeletal:        General: Normal range of motion.  Skin:    General: Skin is warm and dry.  Neurological:     General: No focal deficit present.     Mental Status: He is alert. Mental status is at baseline.  Psychiatric:        Attention and Perception: Attention normal.        Mood and Affect: Mood is anxious.        Speech: Speech normal.        Behavior: Behavior is cooperative.        Thought Content: Thought content normal.        Cognition and Memory: Cognition normal.    Review of Systems  Constitutional: Negative.   HENT: Negative.    Eyes: Negative.   Respiratory: Negative.    Cardiovascular: Negative.   Gastrointestinal: Negative.   Musculoskeletal: Negative.   Skin: Negative.   Neurological: Negative.   Psychiatric/Behavioral:  Negative for depression and suicidal ideas. The patient is nervous/anxious.    Blood pressure 107/73, pulse (!) 129, temperature 97.6 F (36.4 C), resp. rate 16, height 5\' 5"  (1.651 m), weight (!) 169.4 kg, SpO2 100 %. Body mass index is 62.15 kg/m.  Treatment Plan Summary: Medication management and Plan supportive counseling with the patient reviewing the importance of being very careful but persistent  and eating small amounts continuously.  Supported his ability to ask for specific food types if he needed.  Will not increase the dose of the mirtazapine any further tonight.  We can follow up as needed.  Disposition: Supportive therapy provided about ongoing stressors.  Mordecai Rasmussen, MD 05/01/2023 3:26 PM

## 2023-05-01 NOTE — Progress Notes (Signed)
PROGRESS NOTE    Christopher Herman  ZOX:[id number] DOB: 1976-11-04 DOA: 04/21/2023 PCP: Pcp, No    Brief Narrative:  47 y.o. male with a PMH significant for severe obesity s/p bariatric surgery, OSA, hypoventilation syndrome, HTN, GERD, cholelithiasis, bilateral extremity lipoma, chronic lymphedema status post IVC filter placement, IDA.   They presented from home to the ED on 04/21/2023 with generalized weakness, extreme fatigue, unintended weight loss, disuse of upper extremities and poor p.o. intake  x a few days. He is chronically bed bound and malnourished.    In the ED, it was found that they had BP 100/73  Pulse (!) 110  Temp 98.2 F (36.8 C) (Oral)  Resp 15  Ht 5\' 5"  (1.651 m)  Wt (!) 185.4 kg  SpO2 100%  BMI 68.02 kg/m .  Significant findings included WBC 27.5, hgb 9.5, platelet 249, Cr 0.74, Na+ 130, K+ 3.2. SARS-CoV-2 PCR negative, Influenza PCR> negative. Blood and urine cultures pending.  Chest Xray> no active cardiopulmonary process    They were initially treated with vancomycin, cefepime, metronidazole.  They were admitted to the ICU for septic shock with hypotension resistant to bolus and required vasopressors.    4/26: consulted nephrology for concerns of worsening renal function and electrolyte abnormalities. Restarted IVF for renal support and hypotension.    4/27:renal function continues to worsen. Vanc trough elevated so not receiving any more Abx currently.  4/28:again Cr continues to rise despite continued fluids and addition of albuterol. Vanc trough remains remarkably high. Consulted CCM as fluids need to be discontinued and having hypotension/tachycardia while on midodrine. May need to resume pressors. Still shock picture.  4/29:hgb decreasing and remains hypotensive despite albumin and midodrine. He is fatigued. No bleeding but likely becoming more anemic due to worsening kidney function. Spoke with patient and his mother on the phone this morning and we are  going to transfuse a unit of RBC today for hemodynamic support. Cr continues to rise.    04/28/23 - Despite blood transfusion and good response yesterday, not having improved fatigue. Blood pressures remain soft with mild improvement. MAP ~70. Seen by palliative with no change in management.   5/1: Remains on midodrine.  Maps appears slightly improved.  Palliative care and RD evaluated patient today.  Patient concerned about persistent diarrhea and is hesitant to eat.  Adjustments made per RD recommendations.  Psychiatry consulted as patient endorsed feeling depressed.  5/3: Weaning hydrocortisone.  Patient attempting to eat more.  Psychiatry consulted.  Recommendations appreciated.  Vancomycin stopped per ID.   Assessment & Plan:   Principal Problem:   Severe hypothyroidism Active Problems:   Malnutrition of moderate degree (HCC)   Generalized weakness   Ulcer of right ankle (HCC)   Hypotension due to hypovolemia   Acute renal failure due to tubular necrosis (HCC)   Hypothyroidism  SEPTIC shock SOURCE-UTI Patient has multiple sources of infection including urine and bilateral lower extremity cellulitis.  Blood pressures remain adequate but somewhat borderline. Plan: Patient has received 5-day course of vancomycin.  ID feels blood cultures are likely contaminants so antibiotics have been discontinued.  Will continue blood pressure support with albumin and midodrine.  Wean hydrocortisone  Severe Hypothyroidism- Last TSH, Free T4 check was 11 years ago with normal TSH TSH: 9.134 Free T4:1.34 Am cortisol 4/23 WNL -s/p IV Synthroid 250 mcg followed by 100 mcg daily. Repeat TSH on 4/25 is 1.393 Plan: Continue Synthroid 100 mcg daily Wean hydrocortisone, 50 mg daily starting tomorrow  Acute renal failure- Cr 1.06>1.27>1.3>1.4>1.49 today, up from baseline of about 0.6 Hyponatremia- 130>126>128>>129.  hypokalemia- K+ 3.3>>4.5>4.4 s/p repletion with IVF.  Renal US neg for  hydronephrosis or obstruction. Foley removed 4/26.  Urine output remains adequate Plan: Nephrology consulted.  Currently no indication for inpatient hemodialysis.  Creatinine slowly worsening.  Encourage p.o. intake.   Thrombocytosis  Significant leukocytosis and rising despite supportive care and antibiotics. WBC 27.5>32.8>44.4>32.4>31.3> 21.2. diff showing abs neutrophil predominance. Platelets [phone]. Morphology unremarkable on pathology report. Plan: Persistent leukocytosis.  Appears chronic.  Continues daily labs.  No antibiotics at this time.   Anemia of chronic disease-transfused 1u pRBCs 4/29.  Monitor and replace as necessary.  Maintain hemoglobin greater than 8   Morbid obesity  malnutrition s/p bariatric surgery  chronic diarrhea Patient has had issues with chronic diarrhea.  Discussed with RD at length.  Will initiate several agents to encourage p.o. intake and hopefully slow the volume of diarrhea.  Seemingly effective   Sinus tachycardia  prolonged Qtc- 501. HR 110s and ECG showing sinus. Asymptomatic - avoid Qtc prolonging agents   Bed-bound - PT/OT evaluation and therapy - frequent turning  Depression Patient endorsed to both me as well as palliative care feeling depressed.  This is likely impacting his oral intake and unfortunately delaying wound healing.  Initiated Remeron per RD recommendations.  Official consult to psychiatry placed 5/1.  Recommendations appreciated  DVT prophylaxis: SQ heparin Code Status: Full Family Communication: None Disposition Plan: Status is: Inpatient Remains inpatient appropriate because: Resolving septic shock   Level of care: Progressive  Consultants:  Nephrology Psychiatry Palliative care ID  Procedures:  None  Antimicrobials:   Subjective: Seen and examined.  Mood appears improved this morning.  Objective: Vitals:   05/01/23 0210 05/01/23 0220 05/01/23 0837 05/01/23 1125  BP:  102/75 100/80 107/73   Pulse: 90 (!) 105 (!) 127 (!) 129  Resp: 15 17 14 16   Temp:   97.6 F (36.4 C) 97.6 F (36.4 C)  TempSrc:      SpO2: 100% 100% 100% 100%  Weight:      Height:        Intake/Output Summary (Last 24 hours) at 05/01/2023 1334 Last data filed at 05/01/2023 1000 Gross per 24 hour  Intake 250.84 ml  Output --  Net 250.84 ml   Filed Weights   04/28/23 0321 04/29/23 0500 04/30/23 0500  Weight: (!) 172.2 kg (!) 169 kg (!) 169.4 kg    Examination:  General exam: Appears chronically ill.  Appears weak and fatigued Respiratory system: Bibasilar crackles.  Normal work of breathing.  2 L Cardiovascular system: S1-S2, RRR, no murmurs, marked chronic lymphedema BLE Gastrointestinal system: Obese, NT/ND, normal bowel sounds Central nervous system: Alert and oriented. No focal neurological deficits. Extremities: Markedly decreased power bilateral upper and lower extremities Skin: No rashes, lesions or ulcers Psychiatry: Judgement and insight appear impaired. Mood & affect flattened.     Data Reviewed: I have personally reviewed following labs and imaging studies  CBC: Recent Labs  Lab 04/27/23 0623 04/27/23 1831 04/28/23 0703 04/29/23 0602 04/30/23 0450 05/01/23 0535  WBC 27.1* 25.3* 30.0* 21.2* 24.1* 31.3*  NEUTROABS 21.0*  --  24.2* 16.9* 20.2* 27.7*  HGB 7.2* 8.7* 9.1* 8.5* 8.8* 8.5*  HCT 22.2* 27.1* 28.4* 25.8* 26.5* 25.4*  MCV 85.1 85.0 84.0 82.7 82.6 82.5  PLT 440* 369 501* 400 393 353   Basic Metabolic Panel: Recent Labs  Lab 04/27/23 0623 04/27/23 0907 04/28/23 0703 04/29/23 0602  04/30/23 0450 05/01/23 0535  NA  --  129* 129* 130* 130* 132*  K  --  4.5 4.4 4.0 3.6 3.6  CL  --  102 103 105 103 106  CO2  --  16* 15* 17* 16* 16*  GLUCOSE  --  95 99 100* 94 107*  BUN  --  37* 40* 44* 46* 50*  CREATININE  --  1.40* 1.49* 1.56* 1.61* 1.91*  CALCIUM  --  8.0* 8.2* 8.1* 8.2* 8.4*  PHOS 2.7  --  3.1  3.0 3.1 3.2 3.1  3.0   GFR: Estimated Creatinine Clearance:  72.3 mL/min (A) (by C-G formula based on SCr of 1.91 mg/dL (H)). Liver Function Tests: Recent Labs  Lab 04/26/23 0140 04/27/23 0907 04/28/23 0703 04/29/23 0602 04/30/23 0450 05/01/23 0535  AST 14* 16  --   --   --   --   ALT 17 19  --   --   --   --   ALKPHOS 99 104  --   --   --   --   BILITOT 1.0 1.1  --   --   --   --   PROT 5.0* 5.2*  --   --   --   --   ALBUMIN 2.1* 2.4* 2.6* 2.6* 2.7* 2.5*   No results for input(s): "LIPASE", "AMYLASE" in the last 168 hours. No results for input(s): "AMMONIA" in the last 168 hours. Coagulation Profile: No results for input(s): "INR", "PROTIME" in the last 168 hours. Cardiac Enzymes: No results for input(s): "CKTOTAL", "CKMB", "CKMBINDEX", "TROPONINI" in the last 168 hours. BNP (last 3 results) No results for input(s): "PROBNP" in the last 8760 hours. HbA1C: No results for input(s): "HGBA1C" in the last 72 hours. CBG: Recent Labs  Lab 04/30/23 0721 04/30/23 1133 04/30/23 1719 05/01/23 0634 05/01/23 1126  GLUCAP 95 113* 134* 101* 104*   Lipid Profile: No results for input(s): "CHOL", "HDL", "LDLCALC", "TRIG", "CHOLHDL", "LDLDIRECT" in the last 72 hours. Thyroid Function Tests: No results for input(s): "TSH", "T4TOTAL", "FREET4", "T3FREE", "THYROIDAB" in the last 72 hours. Anemia Panel: No results for input(s): "VITAMINB12", "FOLATE", "FERRITIN", "TIBC", "IRON", "RETICCTPCT" in the last 72 hours. Sepsis Labs: No results for input(s): "PROCALCITON", "LATICACIDVEN" in the last 168 hours.  Recent Results (from the past 240 hour(s))  Urine Culture (for pregnant, neutropenic or urologic patients or patients with an indwelling urinary catheter)     Status: None   Collection Time: 04/22/23  5:22 PM   Specimen: Urine, Clean Catch  Result Value Ref Range Status   Specimen Description   Final    URINE, CLEAN CATCH Performed at [facility], [address], [place], [place] [address]    Special Requests   Final     NONE Performed at [facility], [address], [place], [place] [address]    Culture   Final    NO GROWTH Performed at [facility], [address] [place], [place] [address]    Report Status 04/23/2023 FINAL  Final  Culture, blood (Routine X 2) w Reflex to ID Panel     Status: Abnormal   Collection Time: 04/23/23  6:10 AM   Specimen: BLOOD  Result Value Ref Range Status   Specimen Description   Final    BLOOD  LEFT HAND Performed at [facility], [address], [place], [place] [address]    Special Requests   Final    BOTTLES DRAWN AEROBIC ONLY Blood  Culture results may not be optimal due to an inadequate volume of blood received in culture bottles Performed at [facility], [address] [place], [place] [address]    Culture  Setup Time   Final    GRAM POSITIVE COCCI AEROBIC BOTTLE ONLY CRITICAL RESULT CALLED TO, READ BACK BY AND VERIFIED WITH: DEVON MITCHELL 04/24/2023 1422 CP Performed at [facility], [address] [place], [place] [address]    Culture STAPHYLOCOCCUS AURICULARIS (A)  Final   Report Status 04/30/2023 FINAL  Final   Organism ID, Bacteria STAPHYLOCOCCUS AURICULARIS  Final      Susceptibility   Staphylococcus auricularis - MIC*    CIPROFLOXACIN >=8 RESISTANT Resistant     ERYTHROMYCIN 4 INTERMEDIATE Intermediate     GENTAMICIN <=0.5 SENSITIVE Sensitive     OXACILLIN >=4 RESISTANT Resistant     TETRACYCLINE >=16 RESISTANT Resistant     VANCOMYCIN 1 SENSITIVE Sensitive     TRIMETH/SULFA <=10 SENSITIVE Sensitive     CLINDAMYCIN RESISTANT Resistant     RIFAMPIN <=0.5 SENSITIVE Sensitive     Inducible Clindamycin POSITIVE Resistant     * STAPHYLOCOCCUS AURICULARIS  Culture, blood (Routine X 2) w Reflex to ID Panel     Status: None   Collection Time: 04/23/23  6:21 AM   Specimen: BLOOD  Result Value Ref Range Status   Specimen Description BLOOD  RIGHT HAND  Final   Special Requests   Final    BOTTLES DRAWN  AEROBIC AND ANAEROBIC Blood Culture adequate volume   Culture   Final    NO GROWTH 5 DAYS Performed at [facility], [address] [place], [place] [address]    Report Status 04/28/2023 FINAL  Final  Culture, blood (Routine X 2) w Reflex to ID Panel     Status: None (Preliminary result)   Collection Time: 04/28/23  7:05 AM   Specimen: BLOOD  Result Value Ref Range Status   Specimen Description BLOOD  RIGHT ARM  Final   Special Requests   Final    BOTTLES DRAWN AEROBIC AND ANAEROBIC Blood Culture adequate volume   Culture   Final    NO GROWTH 3 DAYS Performed at [facility], [address] [place], [place] [address]    Report Status PENDING  Incomplete  Culture, blood (Routine X 2) w Reflex to ID Panel     Status: None (Preliminary result)   Collection Time: 04/28/23  7:05 AM   Specimen: BLOOD  Result Value Ref Range Status   Specimen Description BLOOD  LEFT AC  Final   Special Requests   Final    BOTTLES DRAWN AEROBIC AND ANAEROBIC Blood Culture results may not be optimal due to an excessive volume of blood received in culture bottles   Culture  Setup Time PENDING  Incomplete   Culture   Final    NO GROWTH 3 DAYS Performed at [facility], [address] [place], [place] [address]    Report Status PENDING  Incomplete  Gastrointestinal Panel by PCR , Stool     Status: None   Collection Time: 04/30/23  5:50 AM   Specimen: Stool  Result Value Ref Range Status   Campylobacter species NOT DETECTED NOT DETECTED Final   Plesimonas shigelloides NOT DETECTED NOT DETECTED Final   Salmonella species NOT DETECTED NOT DETECTED Final   Yersinia enterocolitica NOT DETECTED NOT DETECTED Final   Vibrio species NOT DETECTED NOT DETECTED Final   Vibrio cholerae NOT DETECTED  NOT DETECTED Final   Enteroaggregative E coli (EAEC) NOT DETECTED NOT DETECTED Final   Enteropathogenic E coli (EPEC) NOT DETECTED NOT DETECTED Final   Enterotoxigenic E coli (ETEC) NOT  DETECTED NOT DETECTED Final   Shiga like toxin producing E coli (STEC) NOT DETECTED NOT DETECTED Final   Shigella/Enteroinvasive E coli (EIEC) NOT DETECTED NOT DETECTED Final   Cryptosporidium NOT DETECTED NOT DETECTED Final   Cyclospora cayetanensis NOT DETECTED NOT DETECTED Final   Entamoeba histolytica NOT DETECTED NOT DETECTED Final   Giardia lamblia NOT DETECTED NOT DETECTED Final   Adenovirus F40/41 NOT DETECTED NOT DETECTED Final   Astrovirus NOT DETECTED NOT DETECTED Final   Norovirus GI/GII NOT DETECTED NOT DETECTED Final   Rotavirus A NOT DETECTED NOT DETECTED Final   Sapovirus (I, II, IV, and V) NOT DETECTED NOT DETECTED Final    Comment: Performed at [facility], [address] [place], [place] [address]         Radiology Studies: No results found.      Scheduled Meds:  vitamin C  500 mg Oral BID   Chlorhexidine Gluconate Cloth  6 each Topical Daily   copper  8 mg Oral Daily   feeding supplement (KATE FARMS STANDARD 1.4)  325 mL Oral TID BM   feeding supplement (PROSource TF20)  60 mL Per Tube QID   folic acid  1 mg Oral Daily   heparin injection (subcutaneous)  5,000 Units Subcutaneous Q8H   [START ON 05/02/2023] hydrocortisone sod succinate (SOLU-CORTEF) inj  50 mg Intravenous Daily   insulin aspart  0-15 Units Subcutaneous TID WC   lactase  6,000 Units Oral TID WC   levothyroxine  100 mcg Oral Q0600   lipase/protease/amylase  24,000 Units Oral TID AC   midodrine  10 mg Oral TID WC   mirtazapine  15 mg Oral QHS   multivitamin  15 mL Oral Daily   pantoprazole (PROTONIX) IV  40 mg Intravenous Q24H   rifaximin  550 mg Oral TID   sodium bicarbonate  650 mg Oral TID   thiamine  100 mg Oral Daily   vitamin A  10,000 Units Oral Daily   Vitamin D (Ergocalciferol)  50,000 Units Oral Q7 days   vitamin E  400 Units Oral Daily   zinc sulfate  220 mg Oral Daily   Continuous Infusions:  albumin human 12.5 g (05/01/23 1100)     LOS: 10 days   Tresa Moore, MD [facility]   If 7PM-7AM, please contact night-coverage  05/01/2023, 1:34 PM

## 2023-05-02 DIAGNOSIS — E039 Hypothyroidism, unspecified: Secondary | ICD-10-CM | POA: Diagnosis not present

## 2023-05-02 LAB — BASIC METABOLIC PANEL
Anion gap: 12 (ref 5–15)
BUN: 47 mg/dL — ABNORMAL HIGH (ref 6–20)
CO2: 15 mmol/L — ABNORMAL LOW (ref 22–32)
Calcium: 8.3 mg/dL — ABNORMAL LOW (ref 8.9–10.3)
Chloride: 106 mmol/L (ref 98–111)
Creatinine, Ser: 2.13 mg/dL — ABNORMAL HIGH (ref 0.61–1.24)
GFR, Estimated: 38 mL/min — ABNORMAL LOW (ref >60)
Glucose, Bld: 100 mg/dL — ABNORMAL HIGH (ref 70–99)
Potassium: 2.8 mmol/L — ABNORMAL LOW (ref 3.5–5.1)
Sodium: 133 mmol/L — ABNORMAL LOW (ref 135–145)

## 2023-05-02 LAB — CBC WITH DIFFERENTIAL/PLATELET
Abs Immature Granulocytes: 0.38 10*3/uL — ABNORMAL HIGH (ref 0.00–0.07)
Basophils Absolute: 0 10*3/uL (ref 0.0–0.1)
Basophils Relative: 0 %
Eosinophils Absolute: 0 10*3/uL (ref 0.0–0.5)
Eosinophils Relative: 0 %
HCT: 22.3 % — ABNORMAL LOW (ref 39.0–52.0)
Hemoglobin: 7.5 g/dL — ABNORMAL LOW (ref 13.0–17.0)
Immature Granulocytes: 2 %
Lymphocytes Relative: 6 %
Lymphs Abs: 1.3 10*3/uL (ref 0.7–4.0)
MCH: 27.8 pg (ref 26.0–34.0)
MCHC: 33.6 g/dL (ref 30.0–36.0)
MCV: 82.6 fL (ref 80.0–100.0)
Monocytes Absolute: 0.8 10*3/uL (ref 0.1–1.0)
Monocytes Relative: 4 %
Neutro Abs: 20.1 10*3/uL — ABNORMAL HIGH (ref 1.7–7.7)
Neutrophils Relative %: 88 %
Platelets: 301 10*3/uL (ref 150–400)
RBC: 2.7 MIL/uL — ABNORMAL LOW (ref 4.22–5.81)
RDW: 20.8 % — ABNORMAL HIGH (ref 11.5–15.5)
WBC: 22.6 10*3/uL — ABNORMAL HIGH (ref 4.0–10.5)
nRBC: 0 % (ref 0.0–0.2)

## 2023-05-02 LAB — CULTURE, BLOOD (ROUTINE X 2): Culture: NO GROWTH

## 2023-05-02 LAB — GLUCOSE, CAPILLARY
Glucose-Capillary: 105 mg/dL — ABNORMAL HIGH (ref 70–99)
Glucose-Capillary: 110 mg/dL — ABNORMAL HIGH (ref 70–99)
Glucose-Capillary: 139 mg/dL — ABNORMAL HIGH (ref 70–99)

## 2023-05-02 LAB — PHOSPHORUS: Phosphorus: 2.7 mg/dL (ref 2.5–4.6)

## 2023-05-02 MED ORDER — POTASSIUM CHLORIDE 10 MEQ/50ML IV SOLN
10.0000 meq | INTRAVENOUS | Status: AC
  Administered 2023-05-02 (×4): 10 meq via INTRAVENOUS
  Filled 2023-05-02 (×4): qty 50

## 2023-05-02 MED ORDER — POTASSIUM CHLORIDE 10 MEQ/100ML IV SOLN
10.0000 meq | INTRAVENOUS | Status: DC
  Filled 2023-05-02 (×4): qty 100

## 2023-05-02 NOTE — Progress Notes (Signed)
[facility], [place] 05/02/23  Subjective:   Hospital day # 11  Resting quietly in bed No visitors present Reports appetite improved today Denies nausea or vomiting   Renal: 05/03 0701 - 05/04 0700 In: 280 [P.O.:280] Out: -  Lab Results  Component Value Date   CREATININE 1.91 (H) 05/01/2023   CREATININE 1.61 (H) 04/30/2023   CREATININE 1.56 (H) 04/29/2023     Objective:  Vital signs in last 24 hours:  Temp:  [97.6 F (36.4 C)-98 F (36.7 C)] 98 F (36.7 C) (05/04 0800) Pulse Rate:  [79-129] 120 (05/04 0800) Resp:  [12-19] 13 (05/04 0800) BP: (87-114)/(57-87) 107/76 (05/04 0800) SpO2:  [99 %-100 %] 100 % (05/04 0800)  Weight change:  Filed Weights   04/28/23 0321 04/29/23 0500 04/30/23 0500  Weight: (!) 172.2 kg (!) 169 kg (!) 169.4 kg    Intake/Output:    Intake/Output Summary (Last 24 hours) at 05/02/2023 1000 Last data filed at 05/01/2023 1705 Gross per 24 hour  Intake 180 ml  Output --  Net 180 ml      Physical Exam: General: Super morbidly obese gentleman, laying in the bed  HEENT Moist oral mucous membranes  Pulm/lungs Normal breathing effort room air  CVS/Heart No rub  Abdomen:  Obese, nontender  Extremities: Significant dependent peripheral edema on the thighs  Neurologic: Alert, oriented  Skin: Lichenified skin over calves and lower thighs  Dialysis Access: None at present       Basic Metabolic Panel:  Recent Labs  Lab 04/27/23 0907 04/28/23 0703 04/29/23 0602 04/30/23 0450 05/01/23 0535 05/02/23 0500  NA 129* 129* 130* 130* 132*  --   K 4.5 4.4 4.0 3.6 3.6  --   CL 102 103 105 103 106  --   CO2 16* 15* 17* 16* 16*  --   GLUCOSE 95 99 100* 94 107*  --   BUN 37* 40* 44* 46* 50*  --   CREATININE 1.40* 1.49* 1.56* 1.61* 1.91*  --   CALCIUM 8.0* 8.2* 8.1* 8.2* 8.4*  --   PHOS  --  3.1  3.0 3.1 3.2 3.1  3.0 2.7      CBC: Recent Labs  Lab 04/28/23 0703 04/29/23 0602 04/30/23 0450 05/01/23 0535  05/02/23 0500  WBC 30.0* 21.2* 24.1* 31.3* 22.6*  NEUTROABS 24.2* 16.9* 20.2* 27.7* 20.1*  HGB 9.1* 8.5* 8.8* 8.5* 7.5*  HCT 28.4* 25.8* 26.5* 25.4* 22.3*  MCV 84.0 82.7 82.6 82.5 82.6  PLT 501* 400 393 353 301      No results found for: "HEPBSAG", "HEPBSAB", "HEPBIGM"    Microbiology:  Recent Results (from the past 240 hour(s))  Urine Culture (for pregnant, neutropenic or urologic patients or patients with an indwelling urinary catheter)     Status: None   Collection Time: 04/22/23  5:22 PM   Specimen: Urine, Clean Catch  Result Value Ref Range Status   Specimen Description   Final    URINE, CLEAN CATCH Performed at [facility], [address], [place], [place] [address]    Special Requests   Final    NONE Performed at [facility], [address], [place], [place] [address]    Culture   Final    NO GROWTH Performed at [facility], [address], [place], [place] [address]    Report Status 04/23/2023 FINAL  Final  Culture, blood (Routine X 2) w Reflex to ID Panel     Status: Abnormal  Collection Time: 04/23/23  6:10 AM   Specimen: BLOOD  Result Value Ref Range Status   Specimen Description   Final    BLOOD  LEFT HAND Performed at [facility], [address], [place], [place] [address]    Special Requests   Final    BOTTLES DRAWN AEROBIC ONLY Blood Culture results may not be optimal due to an inadequate volume of blood received in culture bottles Performed at [facility], [address] [place], [place] [address]    Culture  Setup Time   Final    GRAM POSITIVE COCCI AEROBIC BOTTLE ONLY CRITICAL RESULT CALLED TO, READ BACK BY AND VERIFIED WITH: DEVON MITCHELL 04/24/2023 1422 CP Performed at [facility], [address] [place], [place] [address]    Culture STAPHYLOCOCCUS AURICULARIS (A)  Final   Report Status 04/30/2023 FINAL  Final   Organism ID, Bacteria STAPHYLOCOCCUS AURICULARIS  Final       Susceptibility   Staphylococcus auricularis - MIC*    CIPROFLOXACIN >=8 RESISTANT Resistant     ERYTHROMYCIN 4 INTERMEDIATE Intermediate     GENTAMICIN <=0.5 SENSITIVE Sensitive     OXACILLIN >=4 RESISTANT Resistant     TETRACYCLINE >=16 RESISTANT Resistant     VANCOMYCIN 1 SENSITIVE Sensitive     TRIMETH/SULFA <=10 SENSITIVE Sensitive     CLINDAMYCIN RESISTANT Resistant     RIFAMPIN <=0.5 SENSITIVE Sensitive     Inducible Clindamycin POSITIVE Resistant     * STAPHYLOCOCCUS AURICULARIS  Culture, blood (Routine X 2) w Reflex to ID Panel     Status: None   Collection Time: 04/23/23  6:21 AM   Specimen: BLOOD  Result Value Ref Range Status   Specimen Description BLOOD  RIGHT HAND  Final   Special Requests   Final    BOTTLES DRAWN AEROBIC AND ANAEROBIC Blood Culture adequate volume   Culture   Final    NO GROWTH 5 DAYS Performed at [facility], [address] [place], [place] [address]    Report Status 04/28/2023 FINAL  Final  Culture, blood (Routine X 2) w Reflex to ID Panel     Status: None (Preliminary result)   Collection Time: 04/28/23  7:05 AM   Specimen: BLOOD  Result Value Ref Range Status   Specimen Description BLOOD  RIGHT ARM  Final   Special Requests   Final    BOTTLES DRAWN AEROBIC AND ANAEROBIC Blood Culture adequate volume   Culture   Final    NO GROWTH 4 DAYS Performed at [facility], [address], [place], [place] [address]    Report Status PENDING  Incomplete  Culture, blood (Routine X 2) w Reflex to ID Panel     Status: None (Preliminary result)   Collection Time: 04/28/23  7:05 AM   Specimen: BLOOD  Result Value Ref Range Status   Specimen Description BLOOD  LEFT AC  Final   Special Requests   Final    BOTTLES DRAWN AEROBIC AND ANAEROBIC Blood Culture results may not be optimal due to an excessive volume of blood received in culture bottles   Culture  Setup Time PENDING  Incomplete   Culture   Final    NO GROWTH 4 DAYS Performed  at [facility], [address] [place], [place] [address]    Report Status PENDING  Incomplete  Gastrointestinal Panel by PCR , Stool     Status: None   Collection Time: 04/30/23  5:50 AM  Specimen: Stool  Result Value Ref Range Status   Campylobacter species NOT DETECTED NOT DETECTED Final   Plesimonas shigelloides NOT DETECTED NOT DETECTED Final   Salmonella species NOT DETECTED NOT DETECTED Final   Yersinia enterocolitica NOT DETECTED NOT DETECTED Final   Vibrio species NOT DETECTED NOT DETECTED Final   Vibrio cholerae NOT DETECTED NOT DETECTED Final   Enteroaggregative E coli (EAEC) NOT DETECTED NOT DETECTED Final   Enteropathogenic E coli (EPEC) NOT DETECTED NOT DETECTED Final   Enterotoxigenic E coli (ETEC) NOT DETECTED NOT DETECTED Final   Shiga like toxin producing E coli (STEC) NOT DETECTED NOT DETECTED Final   Shigella/Enteroinvasive E coli (EIEC) NOT DETECTED NOT DETECTED Final   Cryptosporidium NOT DETECTED NOT DETECTED Final   Cyclospora cayetanensis NOT DETECTED NOT DETECTED Final   Entamoeba histolytica NOT DETECTED NOT DETECTED Final   Giardia lamblia NOT DETECTED NOT DETECTED Final   Adenovirus F40/41 NOT DETECTED NOT DETECTED Final   Astrovirus NOT DETECTED NOT DETECTED Final   Norovirus GI/GII NOT DETECTED NOT DETECTED Final   Rotavirus A NOT DETECTED NOT DETECTED Final   Sapovirus (I, II, IV, and V) NOT DETECTED NOT DETECTED Final    Comment: Performed at [facility], [address] [place], [place] [address]    Coagulation Studies: No results for input(s): "LABPROT", "INR" in the last 72 hours.  Urinalysis: No results for input(s): "COLORURINE", "LABSPEC", "PHURINE", "GLUCOSEU", "HGBUR", "BILIRUBINUR", "KETONESUR", "PROTEINUR", "UROBILINOGEN", "NITRITE", "LEUKOCYTESUR" in the last 72 hours.  Invalid input(s): "APPERANCEUR"    Imaging: No results found.   Medications:    albumin human 12.5 g (05/01/23 1100)    vitamin C  500  mg Oral BID   Chlorhexidine Gluconate Cloth  6 each Topical Daily   copper  8 mg Oral Daily   feeding supplement (KATE FARMS STANDARD 1.4)  325 mL Oral TID BM   feeding supplement (PROSource TF20)  60 mL Per Tube QID   folic acid  1 mg Oral Daily   heparin injection (subcutaneous)  5,000 Units Subcutaneous Q8H   hydrocortisone sod succinate (SOLU-CORTEF) inj  50 mg Intravenous Daily   insulin aspart  0-15 Units Subcutaneous TID WC   lactase  6,000 Units Oral TID WC   levothyroxine  100 mcg Oral Q0600   lipase/protease/amylase  24,000 Units Oral TID AC   midodrine  10 mg Oral TID WC   mirtazapine  15 mg Oral QHS   multivitamin  15 mL Oral Daily   pantoprazole (PROTONIX) IV  40 mg Intravenous Q24H   rifaximin  550 mg Oral TID   sodium bicarbonate  650 mg Oral TID   thiamine  100 mg Oral Daily   vitamin A  10,000 Units Oral Daily   Vitamin D (Ergocalciferol)  50,000 Units Oral Q7 days   vitamin E  400 Units Oral Daily   zinc sulfate  220 mg Oral Daily   acetaminophen, bismuth subsalicylate, loperamide, polyethylene glycol  Assessment/ Plan:  47 y.o. male with super morbid obesity, hypertension, obstructive sleep apnea, lymphedema, history of bariatric surgery, cholelithiasis, severe hypothyroidism, bedridden status, history of IVC filter placement admitted on 04/21/2023 for Severe hypothyroidism [E03.9] Generalized weakness [R53.1] Lower limb ulcer, ankle, right, with unspecified severity (HCC) [L97.319] Hypothyroidism, unspecified type [E03.9] Sepsis, due to unspecified organism, unspecified whether acute organ dysfunction present (HCC) [A41.9]   Acute kidney injury Likely secondary to hemodynamic alterations caused by septic shock and hypotension and possibly Vancomycin toxicity leading to ATN. Patient is  nonoliguric. Awaiting am labs. Continue current treatments.   Lab Results  Component Value Date   CREATININE 1.91 (H) 05/01/2023   BUN 50 (H) 05/01/2023   NA 132 (L)  05/01/2023   K 3.6 05/01/2023   CL 106 05/01/2023   CO2 16 (L) 05/01/2023     Generalized edema Patient has hypoalbuminemia and malnutrition. Daily IV albumin 12.5g for fluid mobilization. Consuming protein supplementation   Hyponatremia Likely secondary to AKI and generalized volume overload.   Hypotension, received pressor support in ICU.  Continue midodrine 10mg  TID. Blood pressure stable  Chronic systolic CHF LVEF 35 to 40%, moderately decreased function, global hypokinesis, grade 1 diastolic dysfunction, normal right ventricular systolic function from echo 04/22/2023.  Acute metabolic acidosis, serum bicarb 16. Continue oral sodium bicarbonate 650mg  TID.     LOS: 11 Wendee Beavers 5/4/202410:00 AM  [facility] [address] [place], [place] [phone]

## 2023-05-02 NOTE — Progress Notes (Signed)
PROGRESS NOTE    THO RADICE  ZOX:[id number] DOB: 1976-10-07 DOA: 04/21/2023 PCP: Pcp, No    Brief Narrative:  47 y.o. male with a PMH significant for severe obesity s/p bariatric surgery, OSA, hypoventilation syndrome, HTN, GERD, cholelithiasis, bilateral extremity lipoma, chronic lymphedema status post IVC filter placement, IDA.   They presented from home to the ED on 04/21/2023 with generalized weakness, extreme fatigue, unintended weight loss, disuse of upper extremities and poor p.o. intake  x a few days. He is chronically bed bound and malnourished.    In the ED, it was found that they had BP 100/73  Pulse (!) 110  Temp 98.2 F (36.8 C) (Oral)  Resp 15  Ht 5\' 5"  (1.651 m)  Wt (!) 185.4 kg  SpO2 100%  BMI 68.02 kg/m .  Significant findings included WBC 27.5, hgb 9.5, platelet 249, Cr 0.74, Na+ 130, K+ 3.2. SARS-CoV-2 PCR negative, Influenza PCR> negative. Blood and urine cultures pending.  Chest Xray> no active cardiopulmonary process    They were initially treated with vancomycin, cefepime, metronidazole.  They were admitted to the ICU for septic shock with hypotension resistant to bolus and required vasopressors.    4/26: consulted nephrology for concerns of worsening renal function and electrolyte abnormalities. Restarted IVF for renal support and hypotension.    4/27:renal function continues to worsen. Vanc trough elevated so not receiving any more Abx currently.  4/28:again Cr continues to rise despite continued fluids and addition of albuterol. Vanc trough remains remarkably high. Consulted CCM as fluids need to be discontinued and having hypotension/tachycardia while on midodrine. May need to resume pressors. Still shock picture.  4/29:hgb decreasing and remains hypotensive despite albumin and midodrine. He is fatigued. No bleeding but likely becoming more anemic due to worsening kidney function. Spoke with patient and his mother on the phone this morning and we are  going to transfuse a unit of RBC today for hemodynamic support. Cr continues to rise.    04/28/23 - Despite blood transfusion and good response yesterday, not having improved fatigue. Blood pressures remain soft with mild improvement. MAP ~70. Seen by palliative with no change in management.   5/1: Remains on midodrine.  Maps appears slightly improved.  Palliative care and RD evaluated patient today.  Patient concerned about persistent diarrhea and is hesitant to eat.  Adjustments made per RD recommendations.  Psychiatry consulted as patient endorsed feeling depressed.  5/3: Weaning hydrocortisone.  Patient attempting to eat more.  Psychiatry consulted.  Recommendations appreciated.  Vancomycin stopped per ID.   Assessment & Plan:   Principal Problem:   Severe hypothyroidism Active Problems:   Malnutrition of moderate degree (HCC)   Generalized weakness   Ulcer of right ankle (HCC)   Hypotension due to hypovolemia   Acute renal failure due to tubular necrosis (HCC)   Hypothyroidism  SEPTIC shock SOURCE-UTI Patient has multiple sources of infection including urine and bilateral lower extremity cellulitis.  Blood pressures remain adequate but somewhat borderline. Plan: Patient has received 5-day course of vancomycin.  ID feels blood cultures are likely contaminants so antibiotics have been discontinued.  Will continue blood pressure support with albumin and midodrine.  Slowly wean hydrocortisone  Severe Hypothyroidism- Last TSH, Free T4 check was 11 years ago with normal TSH TSH: 9.134 Free T4:1.34 Am cortisol 4/23 WNL -s/p IV Synthroid 250 mcg followed by 100 mcg daily. Repeat TSH on 4/25 is 1.393 Plan: Continue Synthroid 100 mcg daily Weaning hydrocortisone.  50 mg daily starting today  Acute renal failure- Cr 1.06>1.27>1.3>1.4>1.49 today, up from baseline of about 0.6 Hyponatremia- 130>126>128>>129.  hypokalemia- K+ 3.3>>4.5>4.4 s/p repletion with IVF.  Renal US neg for  hydronephrosis or obstruction. Foley removed 4/26.  Urine output remains adequate Plan: Nephrology consulted.  Currently no indication for inpatient hemodialysis.  Creatinine slowly worsening.  Encourage p.o. intake.   Thrombocytosis  Significant leukocytosis and rising despite supportive care and antibiotics. WBC 27.5>32.8>44.4>32.4>31.3> 21.2. diff showing abs neutrophil predominance. Platelets [phone]. Morphology unremarkable on pathology report. Plan: Persistent leukocytosis.  Appears chronic.  Continues daily labs.  No antibiotics at this time.   Anemia of chronic disease-transfused 1u pRBCs 4/29.  Monitor and replace as necessary.  Maintain hemoglobin greater than 8   Morbid obesity  malnutrition s/p bariatric surgery  chronic diarrhea Patient has had issues with chronic diarrhea.  Discussed with RD at length.  Will initiate several agents to encourage p.o. intake and hopefully slow the volume of diarrhea.  Seemingly effective   Sinus tachycardia  prolonged Qtc- 501. HR 110s and ECG showing sinus. Asymptomatic - avoid Qtc prolonging agents   Bed-bound - PT/OT evaluation and therapy - frequent turning  Depression Patient endorsed to both me as well as palliative care feeling depressed.  This is likely impacting his oral intake and unfortunately delaying wound healing.  Initiated Remeron per RD recommendations.  Official consult to psychiatry placed 5/1.  Recommendations appreciated  DVT prophylaxis: SQ heparin Code Status: Full Family Communication: None Disposition Plan: Status is: Inpatient Remains inpatient appropriate because: Resolving septic shock   Level of care: Progressive  Consultants:  Nephrology Psychiatry Palliative care ID  Procedures:  None  Antimicrobials:   Subjective: Seen and examined.  Stable.  No appreciable change  Objective: Vitals:   05/02/23 0600 05/02/23 0800 05/02/23 0917 05/02/23 1151  BP: 108/79 107/76 106/77 106/75   Pulse: (!) 117 (!) 120 (!) 124 (!) 124  Resp:  13 14 16   Temp:  98 F (36.7 C) 98.5 F (36.9 C) 97.9 F (36.6 C)  TempSrc:  Oral Oral   SpO2: 100% 100% 100% 100%  Weight:      Height:        Intake/Output Summary (Last 24 hours) at 05/02/2023 1407 Last data filed at 05/02/2023 1302 Gross per 24 hour  Intake 300 ml  Output --  Net 300 ml   Filed Weights   04/28/23 0321 04/29/23 0500 04/30/23 0500  Weight: (!) 172.2 kg (!) 169 kg (!) 169.4 kg    Examination:  General exam: Chronically ill-appearing.  Weak.  Fatigued Respiratory system: Lung sounds diminished.  Normal work of breathing.  2 L Cardiovascular system: S1-S2, RRR, no murmurs, marked chronic lymphedema BLE Gastrointestinal system: Obese, NT/ND, normal bowel sounds Central nervous system: Alert and oriented. No focal neurological deficits. Extremities: Markedly decreased power bilateral upper and lower extremities Skin: No rashes, lesions or ulcers Psychiatry: Judgement and insight appear impaired. Mood & affect flattened.     Data Reviewed: I have personally reviewed following labs and imaging studies  CBC: Recent Labs  Lab 04/28/23 0703 04/29/23 0602 04/30/23 0450 05/01/23 0535 05/02/23 0500  WBC 30.0* 21.2* 24.1* 31.3* 22.6*  NEUTROABS 24.2* 16.9* 20.2* 27.7* 20.1*  HGB 9.1* 8.5* 8.8* 8.5* 7.5*  HCT 28.4* 25.8* 26.5* 25.4* 22.3*  MCV 84.0 82.7 82.6 82.5 82.6  PLT 501* 400 393 353 301   Basic Metabolic Panel: Recent Labs  Lab 04/28/23 0703 04/29/23 0602 04/30/23 0450 05/01/23 0535 05/02/23 0500 05/02/23 0757  NA 129*  130* 130* 132*  --  133*  K 4.4 4.0 3.6 3.6  --  2.8*  CL 103 105 103 106  --  106  CO2 15* 17* 16* 16*  --  15*  GLUCOSE 99 100* 94 107*  --  100*  BUN 40* 44* 46* 50*  --  47*  CREATININE 1.49* 1.56* 1.61* 1.91*  --  2.13*  CALCIUM 8.2* 8.1* 8.2* 8.4*  --  8.3*  PHOS 3.1  3.0 3.1 3.2 3.1  3.0 2.7  --    GFR: Estimated Creatinine Clearance: 64.9 mL/min (A) (by C-G  formula based on SCr of 2.13 mg/dL (H)). Liver Function Tests: Recent Labs  Lab 04/26/23 0140 04/27/23 0907 04/28/23 0703 04/29/23 0602 04/30/23 0450 05/01/23 0535  AST 14* 16  --   --   --   --   ALT 17 19  --   --   --   --   ALKPHOS 99 104  --   --   --   --   BILITOT 1.0 1.1  --   --   --   --   PROT 5.0* 5.2*  --   --   --   --   ALBUMIN 2.1* 2.4* 2.6* 2.6* 2.7* 2.5*   No results for input(s): "LIPASE", "AMYLASE" in the last 168 hours. No results for input(s): "AMMONIA" in the last 168 hours. Coagulation Profile: No results for input(s): "INR", "PROTIME" in the last 168 hours. Cardiac Enzymes: No results for input(s): "CKTOTAL", "CKMB", "CKMBINDEX", "TROPONINI" in the last 168 hours. BNP (last 3 results) No results for input(s): "PROBNP" in the last 8760 hours. HbA1C: No results for input(s): "HGBA1C" in the last 72 hours. CBG: Recent Labs  Lab 05/01/23 0634 05/01/23 1126 05/01/23 1542 05/02/23 0920 05/02/23 1153  GLUCAP 101* 104* 134* 105* 110*   Lipid Profile: No results for input(s): "CHOL", "HDL", "LDLCALC", "TRIG", "CHOLHDL", "LDLDIRECT" in the last 72 hours. Thyroid Function Tests: No results for input(s): "TSH", "T4TOTAL", "FREET4", "T3FREE", "THYROIDAB" in the last 72 hours. Anemia Panel: No results for input(s): "VITAMINB12", "FOLATE", "FERRITIN", "TIBC", "IRON", "RETICCTPCT" in the last 72 hours. Sepsis Labs: No results for input(s): "PROCALCITON", "LATICACIDVEN" in the last 168 hours.  Recent Results (from the past 240 hour(s))  Urine Culture (for pregnant, neutropenic or urologic patients or patients with an indwelling urinary catheter)     Status: None   Collection Time: 04/22/23  5:22 PM   Specimen: Urine, Clean Catch  Result Value Ref Range Status   Specimen Description   Final    URINE, CLEAN CATCH Performed at [facility], [address] [place], [place] [address]    Special Requests   Final    NONE Performed at [facility], [address], [place], [place] [address]    Culture   Final    NO GROWTH Performed at [facility], [address] [place], [place] [address]    Report Status 04/23/2023 FINAL  Final  Culture, blood (Routine X 2) w Reflex to ID Panel     Status: Abnormal   Collection Time: 04/23/23  6:10 AM   Specimen: BLOOD  Result Value Ref Range Status   Specimen Description   Final    BLOOD  LEFT HAND Performed at [facility], [address] [place], [place] [address]    Special Requests   Final    BOTTLES DRAWN AEROBIC ONLY Blood Culture results may not be optimal due  to an inadequate volume of blood received in culture bottles Performed at [facility], [address] [place], [place] [address]    Culture  Setup Time   Final    GRAM POSITIVE COCCI AEROBIC BOTTLE ONLY CRITICAL RESULT CALLED TO, READ BACK BY AND VERIFIED WITH: DEVON MITCHELL 04/24/2023 1422 CP Performed at [facility], [address] [place], [place] [address]    Culture STAPHYLOCOCCUS AURICULARIS (A)  Final   Report Status 04/30/2023 FINAL  Final   Organism ID, Bacteria STAPHYLOCOCCUS AURICULARIS  Final      Susceptibility   Staphylococcus auricularis - MIC*    CIPROFLOXACIN >=8 RESISTANT Resistant     ERYTHROMYCIN 4 INTERMEDIATE Intermediate     GENTAMICIN <=0.5 SENSITIVE Sensitive     OXACILLIN >=4 RESISTANT Resistant     TETRACYCLINE >=16 RESISTANT Resistant     VANCOMYCIN 1 SENSITIVE Sensitive     TRIMETH/SULFA <=10 SENSITIVE Sensitive     CLINDAMYCIN RESISTANT Resistant     RIFAMPIN <=0.5 SENSITIVE Sensitive     Inducible Clindamycin POSITIVE Resistant     * STAPHYLOCOCCUS AURICULARIS  Culture, blood (Routine X 2) w Reflex to ID Panel     Status: None   Collection Time: 04/23/23  6:21 AM   Specimen: BLOOD  Result Value Ref Range Status   Specimen Description BLOOD  RIGHT HAND  Final   Special Requests   Final    BOTTLES DRAWN AEROBIC AND ANAEROBIC Blood  Culture adequate volume   Culture   Final    NO GROWTH 5 DAYS Performed at [facility], [address] [place], [place] [address]    Report Status 04/28/2023 FINAL  Final  Culture, blood (Routine X 2) w Reflex to ID Panel     Status: None (Preliminary result)   Collection Time: 04/28/23  7:05 AM   Specimen: BLOOD  Result Value Ref Range Status   Specimen Description BLOOD  RIGHT ARM  Final   Special Requests   Final    BOTTLES DRAWN AEROBIC AND ANAEROBIC Blood Culture adequate volume   Culture   Final    NO GROWTH 4 DAYS Performed at [facility], [address], [place], [place] [address]    Report Status PENDING  Incomplete  Culture, blood (Routine X 2) w Reflex to ID Panel     Status: None (Preliminary result)   Collection Time: 04/28/23  7:05 AM   Specimen: BLOOD  Result Value Ref Range Status   Specimen Description BLOOD  LEFT AC  Final   Special Requests   Final    BOTTLES DRAWN AEROBIC AND ANAEROBIC Blood Culture results may not be optimal due to an excessive volume of blood received in culture bottles   Culture  Setup Time PENDING  Incomplete   Culture   Final    NO GROWTH 4 DAYS Performed at [facility], [address] [place], [place] [address]    Report Status PENDING  Incomplete  Gastrointestinal Panel by PCR , Stool     Status: None   Collection Time: 04/30/23  5:50 AM   Specimen: Stool  Result Value Ref Range Status   Campylobacter species NOT DETECTED NOT DETECTED Final   Plesimonas shigelloides NOT DETECTED NOT DETECTED Final   Salmonella species NOT DETECTED NOT DETECTED Final   Yersinia enterocolitica NOT DETECTED NOT DETECTED Final   Vibrio species NOT DETECTED NOT DETECTED Final   Vibrio cholerae NOT DETECTED NOT DETECTED Final   Enteroaggregative E  coli (EAEC) NOT DETECTED NOT DETECTED Final   Enteropathogenic E coli (EPEC) NOT DETECTED NOT DETECTED Final   Enterotoxigenic E coli (ETEC) NOT DETECTED NOT DETECTED Final    Shiga like toxin producing E coli (STEC) NOT DETECTED NOT DETECTED Final   Shigella/Enteroinvasive E coli (EIEC) NOT DETECTED NOT DETECTED Final   Cryptosporidium NOT DETECTED NOT DETECTED Final   Cyclospora cayetanensis NOT DETECTED NOT DETECTED Final   Entamoeba histolytica NOT DETECTED NOT DETECTED Final   Giardia lamblia NOT DETECTED NOT DETECTED Final   Adenovirus F40/41 NOT DETECTED NOT DETECTED Final   Astrovirus NOT DETECTED NOT DETECTED Final   Norovirus GI/GII NOT DETECTED NOT DETECTED Final   Rotavirus A NOT DETECTED NOT DETECTED Final   Sapovirus (I, II, IV, and V) NOT DETECTED NOT DETECTED Final    Comment: Performed at [facility], [address] [place], [place] [address]         Radiology Studies: No results found.      Scheduled Meds:  vitamin C  500 mg Oral BID   Chlorhexidine Gluconate Cloth  6 each Topical Daily   copper  8 mg Oral Daily   feeding supplement (KATE FARMS STANDARD 1.4)  325 mL Oral TID BM   feeding supplement (PROSource TF20)  60 mL Per Tube QID   folic acid  1 mg Oral Daily   heparin injection (subcutaneous)  5,000 Units Subcutaneous Q8H   hydrocortisone sod succinate (SOLU-CORTEF) inj  50 mg Intravenous Daily   insulin aspart  0-15 Units Subcutaneous TID WC   lactase  6,000 Units Oral TID WC   levothyroxine  100 mcg Oral Q0600   lipase/protease/amylase  24,000 Units Oral TID AC   midodrine  10 mg Oral TID WC   mirtazapine  15 mg Oral QHS   multivitamin  15 mL Oral Daily   pantoprazole (PROTONIX) IV  40 mg Intravenous Q24H   rifaximin  550 mg Oral TID   sodium bicarbonate  650 mg Oral TID   thiamine  100 mg Oral Daily   vitamin A  10,000 Units Oral Daily   Vitamin D (Ergocalciferol)  50,000 Units Oral Q7 days   vitamin E  400 Units Oral Daily   zinc sulfate  220 mg Oral Daily   Continuous Infusions:  albumin human 12.5 g (05/02/23 1211)     LOS: 11 days   Tresa Moore, MD [facility]   If  7PM-7AM, please contact night-coverage  05/02/2023, 2:07 PM

## 2023-05-02 NOTE — Plan of Care (Signed)

## 2023-05-03 DIAGNOSIS — E039 Hypothyroidism, unspecified: Secondary | ICD-10-CM | POA: Diagnosis not present

## 2023-05-03 LAB — CBC WITH DIFFERENTIAL/PLATELET
Abs Immature Granulocytes: 0.23 10*3/uL — ABNORMAL HIGH (ref 0.00–0.07)
Basophils Absolute: 0 10*3/uL (ref 0.0–0.1)
Basophils Relative: 0 %
Eosinophils Absolute: 0 10*3/uL (ref 0.0–0.5)
Eosinophils Relative: 0 %
HCT: 20.1 % — ABNORMAL LOW (ref 39.0–52.0)
Hemoglobin: 6.7 g/dL — ABNORMAL LOW (ref 13.0–17.0)
Immature Granulocytes: 1 %
Lymphocytes Relative: 8 %
Lymphs Abs: 1.4 10*3/uL (ref 0.7–4.0)
MCH: 27.9 pg (ref 26.0–34.0)
MCHC: 33.3 g/dL (ref 30.0–36.0)
MCV: 83.8 fL (ref 80.0–100.0)
Monocytes Absolute: 0.7 10*3/uL (ref 0.1–1.0)
Monocytes Relative: 4 %
Neutro Abs: 15.1 10*3/uL — ABNORMAL HIGH (ref 1.7–7.7)
Neutrophils Relative %: 87 %
Platelets: 285 10*3/uL (ref 150–400)
RBC: 2.4 MIL/uL — ABNORMAL LOW (ref 4.22–5.81)
RDW: 20.8 % — ABNORMAL HIGH (ref 11.5–15.5)
WBC: 17.5 10*3/uL — ABNORMAL HIGH (ref 4.0–10.5)
nRBC: 0.1 % (ref 0.0–0.2)

## 2023-05-03 LAB — BASIC METABOLIC PANEL
Anion gap: 6 (ref 5–15)
BUN: 55 mg/dL — ABNORMAL HIGH (ref 6–20)
CO2: 18 mmol/L — ABNORMAL LOW (ref 22–32)
Calcium: 7.7 mg/dL — ABNORMAL LOW (ref 8.9–10.3)
Chloride: 110 mmol/L (ref 98–111)
Creatinine, Ser: 2.16 mg/dL — ABNORMAL HIGH (ref 0.61–1.24)
GFR, Estimated: 38 mL/min — ABNORMAL LOW (ref >60)
Glucose, Bld: 106 mg/dL — ABNORMAL HIGH (ref 70–99)
Potassium: 2.9 mmol/L — ABNORMAL LOW (ref 3.5–5.1)
Sodium: 134 mmol/L — ABNORMAL LOW (ref 135–145)

## 2023-05-03 LAB — CULTURE, BLOOD (ROUTINE X 2)

## 2023-05-03 LAB — PREPARE RBC (CROSSMATCH)

## 2023-05-03 LAB — MAGNESIUM: Magnesium: 2 mg/dL (ref 1.7–2.4)

## 2023-05-03 LAB — GLUCOSE, CAPILLARY
Glucose-Capillary: 100 mg/dL — ABNORMAL HIGH (ref 70–99)
Glucose-Capillary: 97 mg/dL (ref 70–99)

## 2023-05-03 LAB — PHOSPHORUS: Phosphorus: 2.7 mg/dL (ref 2.5–4.6)

## 2023-05-03 LAB — TYPE AND SCREEN: Unit division: 0

## 2023-05-03 MED ORDER — MIDODRINE HCL 5 MG PO TABS: 5.0000 mg | ORAL_TABLET | Freq: Three times a day (TID) | ORAL | Status: DC

## 2023-05-03 MED ORDER — SODIUM CHLORIDE 0.9% IV SOLUTION: Freq: Once | INTRAVENOUS | Status: AC

## 2023-05-03 NOTE — Progress Notes (Signed)
PROGRESS NOTE    Christopher Herman  KGM:[id number] DOB: 04-30-1976 DOA: 04/21/2023 PCP: Pcp, No    Brief Narrative:  47 y.o. male with a PMH significant for severe obesity s/p bariatric surgery, OSA, hypoventilation syndrome, HTN, GERD, cholelithiasis, bilateral extremity lipoma, chronic lymphedema status post IVC filter placement, IDA.   They presented from home to the ED on 04/21/2023 with generalized weakness, extreme fatigue, unintended weight loss, disuse of upper extremities and poor p.o. intake  x a few days. He is chronically bed bound and malnourished.    In the ED, it was found that they had BP 100/73  Pulse (!) 110  Temp 98.2 F (36.8 C) (Oral)  Resp 15  Ht 5\' 5"  (1.651 m)  Wt (!) 185.4 kg  SpO2 100%  BMI 68.02 kg/m .  Significant findings included WBC 27.5, hgb 9.5, platelet 249, Cr 0.74, Na+ 130, K+ 3.2. SARS-CoV-2 PCR negative, Influenza PCR> negative. Blood and urine cultures pending.  Chest Xray> no active cardiopulmonary process    They were initially treated with vancomycin, cefepime, metronidazole.  They were admitted to the ICU for septic shock with hypotension resistant to bolus and required vasopressors.    4/26: consulted nephrology for concerns of worsening renal function and electrolyte abnormalities. Restarted IVF for renal support and hypotension.    4/27:renal function continues to worsen. Vanc trough elevated so not receiving any more Abx currently.  4/28:again Cr continues to rise despite continued fluids and addition of albuterol. Vanc trough remains remarkably high. Consulted CCM as fluids need to be discontinued and having hypotension/tachycardia while on midodrine. May need to resume pressors. Still shock picture.  4/29:hgb decreasing and remains hypotensive despite albumin and midodrine. He is fatigued. No bleeding but likely becoming more anemic due to worsening kidney function. Spoke with patient and his mother on the phone this morning and we are  going to transfuse a unit of RBC today for hemodynamic support. Cr continues to rise.    04/28/23 - Despite blood transfusion and good response yesterday, not having improved fatigue. Blood pressures remain soft with mild improvement. MAP ~70. Seen by palliative with no change in management.   5/1: Remains on midodrine.  Maps appears slightly improved.  Palliative care and RD evaluated patient today.  Patient concerned about persistent diarrhea and is hesitant to eat.  Adjustments made per RD recommendations.  Psychiatry consulted as patient endorsed feeling depressed.  5/3: Weaning hydrocortisone.  Patient attempting to eat more.  Psychiatry consulted.  Recommendations appreciated.  Vancomycin stopped per ID.   Assessment & Plan:   Principal Problem:   Severe hypothyroidism Active Problems:   Malnutrition of moderate degree (HCC)   Generalized weakness   Ulcer of right ankle (HCC)   Hypotension due to hypovolemia   Acute renal failure due to tubular necrosis (HCC)   Hypothyroidism  SEPTIC shock SOURCE-UTI Patient has multiple sources of infection including urine and bilateral lower extremity cellulitis.  Blood pressures remain adequate but somewhat borderline. Plan: Patient has received 5-day course of vancomycin.  ID feels blood cultures are likely contaminants so antibiotics have been discontinued.  Will continue blood pressure support with albumin and midodrine.  Midodrine decreased to 5 mg 3 times daily.  Slowly wean hydrocortisone  Severe Hypothyroidism- Last TSH, Free T4 check was 11 years ago with normal TSH TSH: 9.134 Free T4:1.34 Am cortisol 4/23 WNL -s/p IV Synthroid 250 mcg followed by 100 mcg daily. Repeat TSH on 4/25 is 1.393 Plan: Continue Synthroid 100 mcg  daily Weaning hydrocortisone.  50 mg daily x 3 days then stop, last dose 5/6   Acute renal failure- Cr 1.06>1.27>1.3>1.4>1.49 today, up from baseline of about 0.6 Hyponatremia- 130>126>128>>129.  hypokalemia-  K+ 3.3>>4.5>4.4 s/p repletion with IVF.  Renal US neg for hydronephrosis or obstruction. Foley removed 4/26.  Urine output remains adequate Plan: Nephrology consulted.  Currently no indication for inpatient hemodialysis.  Creatinine slowly worsening.  Encourage p.o. intake.  Patient nonoliguric   Thrombocytosis  Significant leukocytosis and rising despite supportive care and antibiotics. WBC 27.5>32.8>44.4>32.4>31.3> 21.2. diff showing abs neutrophil predominance. Platelets [phone]. Morphology unremarkable on pathology report. Plan: Persistent leukocytosis.  Appears chronic.  Continues daily labs.  No antibiotics at this time.   Anemia of chronic disease-transfused 1u pRBCs 4/29.  Monitor and replace as necessary.  Maintain hemoglobin greater than 8   Morbid obesity  malnutrition s/p bariatric surgery  chronic diarrhea Patient has had issues with chronic diarrhea.  Discussed with RD at length.  Will initiate several agents to encourage p.o. intake and hopefully slow the volume of diarrhea.  Seemingly effective   Sinus tachycardia  prolonged Qtc- 501. HR 110s and ECG showing sinus. Asymptomatic - avoid Qtc prolonging agents   Bed-bound - PT/OT evaluation and therapy - frequent turning  Depression Patient endorsed to both me as well as palliative care feeling depressed.  This is likely impacting his oral intake and unfortunately delaying wound healing.  Initiated Remeron per RD recommendations.  Official consult to psychiatry placed 5/1.  Recommendations appreciated  DVT prophylaxis: SQ heparin Code Status: Full Family Communication: Mother via phone 5/5 Disposition Plan: Status is: Inpatient Remains inpatient appropriate because: Resolving septic shock   Level of care: Progressive  Consultants:  Nephrology Psychiatry Palliative care ID  Procedures:  None  Antimicrobials:   Subjective: Seen and examined.  Stable.  No appreciable change  Objective: Vitals:    05/03/23 0437 05/03/23 0858 05/03/23 1200 05/03/23 1228  BP: 97/72 102/75 105/81 108/80  Pulse: 100  (!) 115   Resp: 16  17   Temp: 98 F (36.7 C) 97.6 F (36.4 C) 98.8 F (37.1 C) 98.4 F (36.9 C)  TempSrc:  Oral  Oral  SpO2: 100%  100%   Weight:      Height:        Intake/Output Summary (Last 24 hours) at 05/03/2023 1348 Last data filed at 05/03/2023 1039 Gross per 24 hour  Intake 256.21 ml  Output 400 ml  Net -143.79 ml   Filed Weights   04/28/23 0321 04/29/23 0500 04/30/23 0500  Weight: (!) 172.2 kg (!) 169 kg (!) 169.4 kg    Examination:  General exam: Chronically ill-appearing.  Weak.  Fatigued Respiratory system: Lung sounds diminished.  Normal work of breathing.  2 L Cardiovascular system: S1-S2, RRR, no murmurs, marked chronic lymphedema BLE Gastrointestinal system: Obese, NT/ND, normal bowel sounds Central nervous system: Alert and oriented. No focal neurological deficits. Extremities: Markedly decreased power bilateral upper and lower extremities Skin: No rashes, lesions or ulcers Psychiatry: Judgement and insight appear impaired. Mood & affect flattened.     Data Reviewed: I have personally reviewed following labs and imaging studies  CBC: Recent Labs  Lab 04/29/23 0602 04/30/23 0450 05/01/23 0535 05/02/23 0500 05/03/23 0500  WBC 21.2* 24.1* 31.3* 22.6* 17.5*  NEUTROABS 16.9* 20.2* 27.7* 20.1* 15.1*  HGB 8.5* 8.8* 8.5* 7.5* 6.7*  HCT 25.8* 26.5* 25.4* 22.3* 20.1*  MCV 82.7 82.6 82.5 82.6 83.8  PLT 400 393 353 301 285  Basic Metabolic Panel: Recent Labs  Lab 04/29/23 0602 04/30/23 0450 05/01/23 0535 05/02/23 0500 05/02/23 0757 05/03/23 0500  NA 130* 130* 132*  --  133* 134*  K 4.0 3.6 3.6  --  2.8* 2.9*  CL 105 103 106  --  106 110  CO2 17* 16* 16*  --  15* 18*  GLUCOSE 100* 94 107*  --  100* 106*  BUN 44* 46* 50*  --  47* 55*  CREATININE 1.56* 1.61* 1.91*  --  2.13* 2.16*  CALCIUM 8.1* 8.2* 8.4*  --  8.3* 7.7*  MG  --   --   --   --    --  2.0  PHOS 3.1 3.2 3.1  3.0 2.7  --  2.7   GFR: Estimated Creatinine Clearance: 64 mL/min (A) (by C-G formula based on SCr of 2.16 mg/dL (H)). Liver Function Tests: Recent Labs  Lab 04/27/23 0907 04/28/23 0703 04/29/23 0602 04/30/23 0450 05/01/23 0535  AST 16  --   --   --   --   ALT 19  --   --   --   --   ALKPHOS 104  --   --   --   --   BILITOT 1.1  --   --   --   --   PROT 5.2*  --   --   --   --   ALBUMIN 2.4* 2.6* 2.6* 2.7* 2.5*   No results for input(s): "LIPASE", "AMYLASE" in the last 168 hours. No results for input(s): "AMMONIA" in the last 168 hours. Coagulation Profile: No results for input(s): "INR", "PROTIME" in the last 168 hours. Cardiac Enzymes: No results for input(s): "CKTOTAL", "CKMB", "CKMBINDEX", "TROPONINI" in the last 168 hours. BNP (last 3 results) No results for input(s): "PROBNP" in the last 8760 hours. HbA1C: No results for input(s): "HGBA1C" in the last 72 hours. CBG: Recent Labs  Lab 05/01/23 1542 05/02/23 0920 05/02/23 1153 05/02/23 1658 05/03/23 0641  GLUCAP 134* 105* 110* 139* 100*   Lipid Profile: No results for input(s): "CHOL", "HDL", "LDLCALC", "TRIG", "CHOLHDL", "LDLDIRECT" in the last 72 hours. Thyroid Function Tests: No results for input(s): "TSH", "T4TOTAL", "FREET4", "T3FREE", "THYROIDAB" in the last 72 hours. Anemia Panel: No results for input(s): "VITAMINB12", "FOLATE", "FERRITIN", "TIBC", "IRON", "RETICCTPCT" in the last 72 hours. Sepsis Labs: No results for input(s): "PROCALCITON", "LATICACIDVEN" in the last 168 hours.  Recent Results (from the past 240 hour(s))  Culture, blood (Routine X 2) w Reflex to ID Panel     Status: None   Collection Time: 04/28/23  7:05 AM   Specimen: BLOOD  Result Value Ref Range Status   Specimen Description BLOOD  RIGHT ARM  Final   Special Requests   Final    BOTTLES DRAWN AEROBIC AND ANAEROBIC Blood Culture adequate volume   Culture   Final    NO GROWTH 5 DAYS Performed at  [facility], [address] [place], [place] [address]    Report Status 05/03/2023 FINAL  Final  Culture, blood (Routine X 2) w Reflex to ID Panel     Status: None (Preliminary result)   Collection Time: 04/28/23  7:05 AM   Specimen: BLOOD  Result Value Ref Range Status   Specimen Description BLOOD  LEFT AC  Final   Special Requests   Final    BOTTLES DRAWN AEROBIC AND ANAEROBIC Blood Culture results may not be optimal due to an excessive volume of blood received in culture bottles  Culture  Setup Time PENDING  Incomplete   Culture   Final    NO GROWTH 5 DAYS Performed at [facility], [address] [place], [place] [address]    Report Status PENDING  Incomplete  Gastrointestinal Panel by PCR , Stool     Status: None   Collection Time: 04/30/23  5:50 AM   Specimen: Stool  Result Value Ref Range Status   Campylobacter species NOT DETECTED NOT DETECTED Final   Plesimonas shigelloides NOT DETECTED NOT DETECTED Final   Salmonella species NOT DETECTED NOT DETECTED Final   Yersinia enterocolitica NOT DETECTED NOT DETECTED Final   Vibrio species NOT DETECTED NOT DETECTED Final   Vibrio cholerae NOT DETECTED NOT DETECTED Final   Enteroaggregative E coli (EAEC) NOT DETECTED NOT DETECTED Final   Enteropathogenic E coli (EPEC) NOT DETECTED NOT DETECTED Final   Enterotoxigenic E coli (ETEC) NOT DETECTED NOT DETECTED Final   Shiga like toxin producing E coli (STEC) NOT DETECTED NOT DETECTED Final   Shigella/Enteroinvasive E coli (EIEC) NOT DETECTED NOT DETECTED Final   Cryptosporidium NOT DETECTED NOT DETECTED Final   Cyclospora cayetanensis NOT DETECTED NOT DETECTED Final   Entamoeba histolytica NOT DETECTED NOT DETECTED Final   Giardia lamblia NOT DETECTED NOT DETECTED Final   Adenovirus F40/41 NOT DETECTED NOT DETECTED Final   Astrovirus NOT DETECTED NOT DETECTED Final   Norovirus GI/GII NOT DETECTED NOT DETECTED Final   Rotavirus A NOT DETECTED NOT DETECTED Final    Sapovirus (I, II, IV, and V) NOT DETECTED NOT DETECTED Final    Comment: Performed at [facility], [address], [place], [place] [address]         Radiology Studies: No results found.      Scheduled Meds:  vitamin C  500 mg Oral BID   Chlorhexidine Gluconate Cloth  6 each Topical Daily   copper  8 mg Oral Daily   feeding supplement (KATE FARMS STANDARD 1.4)  325 mL Oral TID BM   feeding supplement (PROSource TF20)  60 mL Per Tube QID   folic acid  1 mg Oral Daily   heparin injection (subcutaneous)  5,000 Units Subcutaneous Q8H   hydrocortisone sod succinate (SOLU-CORTEF) inj  50 mg Intravenous Daily   insulin aspart  0-15 Units Subcutaneous TID WC   lactase  6,000 Units Oral TID WC   levothyroxine  100 mcg Oral Q0600   lipase/protease/amylase  24,000 Units Oral TID AC   midodrine  5 mg Oral TID WC   mirtazapine  15 mg Oral QHS   multivitamin  15 mL Oral Daily   pantoprazole (PROTONIX) IV  40 mg Intravenous Q24H   rifaximin  550 mg Oral TID   thiamine  100 mg Oral Daily   vitamin A  10,000 Units Oral Daily   Vitamin D (Ergocalciferol)  50,000 Units Oral Q7 days   vitamin E  400 Units Oral Daily   zinc sulfate  220 mg Oral Daily   Continuous Infusions:  albumin human 12.5 g (05/02/23 1211)     LOS: 12 days   Tresa Moore, MD [facility]   If 7PM-7AM, please contact night-coverage  05/03/2023, 1:48 PM

## 2023-05-03 NOTE — Plan of Care (Signed)

## 2023-05-04 ENCOUNTER — Other Ambulatory Visit: Payer: Self-pay | Admitting: Family Medicine

## 2023-05-04 DIAGNOSIS — E039 Hypothyroidism, unspecified: Secondary | ICD-10-CM | POA: Diagnosis not present

## 2023-05-04 LAB — GLUCOSE, CAPILLARY
Glucose-Capillary: 96 mg/dL (ref 70–99)
Glucose-Capillary: 107 mg/dL — ABNORMAL HIGH (ref 70–99)
Glucose-Capillary: 138 mg/dL — ABNORMAL HIGH (ref 70–99)

## 2023-05-04 LAB — CBC WITH DIFFERENTIAL/PLATELET
Abs Immature Granulocytes: 0.2 10*3/uL — ABNORMAL HIGH (ref 0.00–0.07)
Basophils Absolute: 0 10*3/uL (ref 0.0–0.1)
Basophils Relative: 0 %
Eosinophils Absolute: 0 10*3/uL (ref 0.0–0.5)
Eosinophils Relative: 0 %
HCT: 22.7 % — ABNORMAL LOW (ref 39.0–52.0)
Hemoglobin: 7.5 g/dL — ABNORMAL LOW (ref 13.0–17.0)
Immature Granulocytes: 1 %
Lymphocytes Relative: 9 %
Lymphs Abs: 1.5 10*3/uL (ref 0.7–4.0)
MCH: 28.1 pg (ref 26.0–34.0)
MCHC: 33 g/dL (ref 30.0–36.0)
MCV: 85 fL (ref 80.0–100.0)
Monocytes Absolute: 0.5 10*3/uL (ref 0.1–1.0)
Monocytes Relative: 4 %
Neutro Abs: 13.3 10*3/uL — ABNORMAL HIGH (ref 1.7–7.7)
Neutrophils Relative %: 86 %
Platelets: 291 10*3/uL (ref 150–400)
RBC: 2.67 MIL/uL — ABNORMAL LOW (ref 4.22–5.81)
RDW: 20.8 % — ABNORMAL HIGH (ref 11.5–15.5)
WBC: 15.6 10*3/uL — ABNORMAL HIGH (ref 4.0–10.5)
nRBC: 0 % (ref 0.0–0.2)

## 2023-05-04 LAB — TYPE AND SCREEN
ABO/RH(D): O POS
Antibody Screen: NEGATIVE

## 2023-05-04 LAB — BASIC METABOLIC PANEL
Anion gap: 10 (ref 5–15)
BUN: 59 mg/dL — ABNORMAL HIGH (ref 6–20)
CO2: 16 mmol/L — ABNORMAL LOW (ref 22–32)
Calcium: 8.2 mg/dL — ABNORMAL LOW (ref 8.9–10.3)
Chloride: 107 mmol/L (ref 98–111)
Creatinine, Ser: 2.42 mg/dL — ABNORMAL HIGH (ref 0.61–1.24)
GFR, Estimated: 33 mL/min — ABNORMAL LOW (ref >60)
Glucose, Bld: 107 mg/dL — ABNORMAL HIGH (ref 70–99)
Potassium: 3.2 mmol/L — ABNORMAL LOW (ref 3.5–5.1)
Sodium: 133 mmol/L — ABNORMAL LOW (ref 135–145)

## 2023-05-04 LAB — BPAM RBC: ISSUE DATE / TIME: 202405051156

## 2023-05-04 LAB — PHOSPHORUS: Phosphorus: 3 mg/dL (ref 2.5–4.6)

## 2023-05-04 NOTE — Progress Notes (Addendum)
PT Cancellation Note  Patient Details Name: Christopher Herman MRN: [id number] DOB: [birth year]   Cancelled Treatment:    Reason Eval/Treat Not Completed: Other (comment) (duplicate PT orders received) PT orders received, chart reviewed. MD reports pt is requesting exercises. Provided pt with HEP handout (ankle pumps, glute sets, quad sets & hip adduction squeezes) & briefly reviewed them with pt. Pt reports he prefers UE exercises but is appreciate of LE HEP handout. PT to notify OT of pt's request as pt on current OT caseload. PT to complete current orders at this time, MD aware.   Aleda Grana, PT, DPT 05/04/23, 3:30 PM   Sandi Mariscal 05/04/2023, 2:43 PM

## 2023-05-04 NOTE — Progress Notes (Signed)
PROGRESS NOTE    Christopher Herman  WUJ:[id number] DOB: 12-09-1976 DOA: 04/21/2023 PCP: Pcp, No    Brief Narrative:  47 y.o. male with a PMH significant for severe obesity s/p bariatric surgery, OSA, hypoventilation syndrome, HTN, GERD, cholelithiasis, bilateral extremity lipoma, chronic lymphedema status post IVC filter placement, IDA.   They presented from home to the ED on 04/21/2023 with generalized weakness, extreme fatigue, unintended weight loss, disuse of upper extremities and poor p.o. intake  x a few days. He is chronically bed bound and malnourished.    In the ED, it was found that they had BP 100/73  Pulse (!) 110  Temp 98.2 F (36.8 C) (Oral)  Resp 15  Ht 5\' 5"  (1.651 m)  Wt (!) 185.4 kg  SpO2 100%  BMI 68.02 kg/m .  Significant findings included WBC 27.5, hgb 9.5, platelet 249, Cr 0.74, Na+ 130, K+ 3.2. SARS-CoV-2 PCR negative, Influenza PCR> negative. Blood and urine cultures pending.  Chest Xray> no active cardiopulmonary process    They were initially treated with vancomycin, cefepime, metronidazole.  They were admitted to the ICU for septic shock with hypotension resistant to bolus and required vasopressors.    4/26: consulted nephrology for concerns of worsening renal function and electrolyte abnormalities. Restarted IVF for renal support and hypotension.    4/27:renal function continues to worsen. Vanc trough elevated so not receiving any more Abx currently.  4/28:again Cr continues to rise despite continued fluids and addition of albuterol. Vanc trough remains remarkably high. Consulted CCM as fluids need to be discontinued and having hypotension/tachycardia while on midodrine. May need to resume pressors. Still shock picture.  4/29:hgb decreasing and remains hypotensive despite albumin and midodrine. He is fatigued. No bleeding but likely becoming more anemic due to worsening kidney function. Spoke with patient and his mother on the phone this morning and we are  going to transfuse a unit of RBC today for hemodynamic support. Cr continues to rise.    04/28/23 - Despite blood transfusion and good response yesterday, not having improved fatigue. Blood pressures remain soft with mild improvement. MAP ~70. Seen by palliative with no change in management.   5/1: Remains on midodrine.  Maps appears slightly improved.  Palliative care and RD evaluated patient today.  Patient concerned about persistent diarrhea and is hesitant to eat.  Adjustments made per RD recommendations.  Psychiatry consulted as patient endorsed feeling depressed.  5/3: Weaning hydrocortisone.  Patient attempting to eat more.  Psychiatry consulted.  Recommendations appreciated.  Vancomycin stopped per ID.  5/6: Maps improved.  Midodrine stopped 5/6.  Hydrocortisone to complete course today.   Assessment & Plan:   Principal Problem:   Severe hypothyroidism Active Problems:   Malnutrition of moderate degree (HCC)   Generalized weakness   Ulcer of right ankle (HCC)   Hypotension due to hypovolemia   Acute renal failure due to tubular necrosis (HCC)   Hypothyroidism  SEPTIC shock SOURCE-UTI Patient has multiple sources of infection including urine and bilateral lower extremity cellulitis.  Blood pressures remain adequate but somewhat borderline. Plan: Patient has received 5-day course of vancomycin.  ID feels blood cultures are likely contaminants so antibiotics have been discontinued.  MAP starting to improve.  Midodrine will be discontinued 5/6.  Hydrocortisone to complete course today.  Severe Hypothyroidism- Last TSH, Free T4 check was 11 years ago with normal TSH TSH: 9.134 Free T4:1.34 Am cortisol 4/23 WNL -s/p IV Synthroid 250 mcg followed by 100 mcg daily. Repeat TSH on  4/25 is 1.393 Plan: Continue Synthroid 100 mcg daily Weaning hydrocortisone.  Last dose today   Acute renal failure- Hyponatremia hypokalemia Renal US neg for hydronephrosis or obstruction. Foley  removed 4/26.  Urine output remains adequate Plan: Nephrology consulted.  Currently no indication for inpatient hemodialysis.  Creatinine slowly worsening.  Encourage p.o. intake.  Patient nonoliguric.  Considering ATN from vancomycin exposure   Thrombocytosis  Significant leukocytosis  diff showing abs neutrophil predominance.Morphology unremarkable on pathology report. Plan: White count trending down today.  Continue daily labs.  Hold antibiotics   Anemia of chronic disease-transfused 1u pRBCs 4/29.  Monitor and replace as necessary.  Maintain hemoglobin greater than 8   Morbid obesity  malnutrition s/p bariatric surgery  chronic diarrhea Patient has had issues with chronic diarrhea.  Discussed with RD at length.  Will initiate several agents to encourage p.o. intake and hopefully slow the volume of diarrhea.  Seemingly effective.  Oral intake remains poor but improving.   Sinus tachycardia  prolonged Qtc  501. HR 110s and ECG showing sinus. Asymptomatic - avoid Qtc prolonging agents   Bed-bound - PT/OT evaluation and therapy - frequent turning -Patient will likely need long-term recovery.  May be an appropriate candidate for long-term care versus LTAC placement.  TOC aware  Depression Patient endorsed to both me as well as palliative care feeling depressed.  This is likely impacting his oral intake and unfortunately delaying wound healing.  Initiated Remeron per RD recommendations.  Official consult to psychiatry placed 5/1.  Recommendations appreciated  DVT prophylaxis: SQ heparin Code Status: Full Family Communication: Mother via phone 5/5 Disposition Plan: Status is: Inpatient Remains inpatient appropriate because: Resolving septic shock   Level of care: Progressive  Consultants:  Nephrology Psychiatry Palliative care ID  Procedures:  None  Antimicrobials:   Subjective: Seen and examined.  Stable.  No appreciable change  Objective: Vitals:   05/04/23 0400  05/04/23 0500 05/04/23 0600 05/04/23 1000  BP: 118/81  126/87   Pulse: (!) 111 (!) 107 (!) 109   Resp: 20 19 19    Temp: 98.5 F (36.9 C)   97.6 F (36.4 C)  TempSrc: Oral   Oral  SpO2: 100% 100% 100%   Weight:      Height:        Intake/Output Summary (Last 24 hours) at 05/04/2023 1250 Last data filed at 05/04/2023 0630 Gross per 24 hour  Intake 819.5 ml  Output 850 ml  Net -30.5 ml   Filed Weights   04/28/23 0321 04/29/23 0500 04/30/23 0500  Weight: (!) 172.2 kg (!) 169 kg (!) 169.4 kg    Examination:  General exam: Chronically ill-appearing.  Remains very weak and fatigued Respiratory system: Lung sounds diminished at bases.  Normal work of breathing.  Room air Cardiovascular system: S1-S2, RRR, no murmurs, marked chronic lymphedema BLE Gastrointestinal system: Obese, NT/ND, normal bowel sounds Central nervous system: Alert and oriented. No focal neurological deficits. Extremities: Markedly decreased power bilateral upper and lower extremities Skin: No rashes, lesions or ulcers Psychiatry: Judgement and insight appear impaired. Mood & affect flattened.     Data Reviewed: I have personally reviewed following labs and imaging studies  CBC: Recent Labs  Lab 04/30/23 0450 05/01/23 0535 05/02/23 0500 05/03/23 0500 05/04/23 0500  WBC 24.1* 31.3* 22.6* 17.5* 15.6*  NEUTROABS 20.2* 27.7* 20.1* 15.1* 13.3*  HGB 8.8* 8.5* 7.5* 6.7* 7.5*  HCT 26.5* 25.4* 22.3* 20.1* 22.7*  MCV 82.6 82.5 82.6 83.8 85.0  PLT 393 353 301  285 291   Basic Metabolic Panel: Recent Labs  Lab 04/30/23 0450 05/01/23 0535 05/02/23 0500 05/02/23 0757 05/03/23 0500 05/04/23 0500  NA 130* 132*  --  133* 134* 133*  K 3.6 3.6  --  2.8* 2.9* 3.2*  CL 103 106  --  106 110 107  CO2 16* 16*  --  15* 18* 16*  GLUCOSE 94 107*  --  100* 106* 107*  BUN 46* 50*  --  47* 55* 59*  CREATININE 1.61* 1.91*  --  2.13* 2.16* 2.42*  CALCIUM 8.2* 8.4*  --  8.3* 7.7* 8.2*  MG  --   --   --   --  2.0  --    PHOS 3.2 3.1  3.0 2.7  --  2.7 3.0   GFR: Estimated Creatinine Clearance: 57.1 mL/min (A) (by C-G formula based on SCr of 2.42 mg/dL (H)). Liver Function Tests: Recent Labs  Lab 04/28/23 0703 04/29/23 0602 04/30/23 0450 05/01/23 0535  ALBUMIN 2.6* 2.6* 2.7* 2.5*   No results for input(s): "LIPASE", "AMYLASE" in the last 168 hours. No results for input(s): "AMMONIA" in the last 168 hours. Coagulation Profile: No results for input(s): "INR", "PROTIME" in the last 168 hours. Cardiac Enzymes: No results for input(s): "CKTOTAL", "CKMB", "CKMBINDEX", "TROPONINI" in the last 168 hours. BNP (last 3 results) No results for input(s): "PROBNP" in the last 8760 hours. HbA1C: No results for input(s): "HGBA1C" in the last 72 hours. CBG: Recent Labs  Lab 05/02/23 1658 05/03/23 0641 05/03/23 1802 05/04/23 0810 05/04/23 1204  GLUCAP 139* 100* 97 96 107*   Lipid Profile: No results for input(s): "CHOL", "HDL", "LDLCALC", "TRIG", "CHOLHDL", "LDLDIRECT" in the last 72 hours. Thyroid Function Tests: No results for input(s): "TSH", "T4TOTAL", "FREET4", "T3FREE", "THYROIDAB" in the last 72 hours. Anemia Panel: No results for input(s): "VITAMINB12", "FOLATE", "FERRITIN", "TIBC", "IRON", "RETICCTPCT" in the last 72 hours. Sepsis Labs: No results for input(s): "PROCALCITON", "LATICACIDVEN" in the last 168 hours.  Recent Results (from the past 240 hour(s))  Culture, blood (Routine X 2) w Reflex to ID Panel     Status: None   Collection Time: 04/28/23  7:05 AM   Specimen: BLOOD  Result Value Ref Range Status   Specimen Description BLOOD  RIGHT ARM  Final   Special Requests   Final    BOTTLES DRAWN AEROBIC AND ANAEROBIC Blood Culture adequate volume   Culture   Final    NO GROWTH 5 DAYS Performed at [facility], [address] [place], [place] [address]    Report Status 05/03/2023 FINAL  Final  Culture, blood (Routine X 2) w Reflex to ID Panel     Status: None (Preliminary  result)   Collection Time: 04/28/23  7:05 AM   Specimen: BLOOD  Result Value Ref Range Status   Specimen Description BLOOD  LEFT AC  Final   Special Requests   Final    BOTTLES DRAWN AEROBIC AND ANAEROBIC Blood Culture results may not be optimal due to an excessive volume of blood received in culture bottles   Culture  Setup Time PENDING  Incomplete   Culture   Final    NO GROWTH 5 DAYS Performed at [facility], [address] [place], [place] [address]    Report Status PENDING  Incomplete  Gastrointestinal Panel by PCR , Stool     Status: None   Collection Time: 04/30/23  5:50 AM   Specimen: Stool  Result Value Ref Range Status  Campylobacter species NOT DETECTED NOT DETECTED Final   Plesimonas shigelloides NOT DETECTED NOT DETECTED Final   Salmonella species NOT DETECTED NOT DETECTED Final   Yersinia enterocolitica NOT DETECTED NOT DETECTED Final   Vibrio species NOT DETECTED NOT DETECTED Final   Vibrio cholerae NOT DETECTED NOT DETECTED Final   Enteroaggregative E coli (EAEC) NOT DETECTED NOT DETECTED Final   Enteropathogenic E coli (EPEC) NOT DETECTED NOT DETECTED Final   Enterotoxigenic E coli (ETEC) NOT DETECTED NOT DETECTED Final   Shiga like toxin producing E coli (STEC) NOT DETECTED NOT DETECTED Final   Shigella/Enteroinvasive E coli (EIEC) NOT DETECTED NOT DETECTED Final   Cryptosporidium NOT DETECTED NOT DETECTED Final   Cyclospora cayetanensis NOT DETECTED NOT DETECTED Final   Entamoeba histolytica NOT DETECTED NOT DETECTED Final   Giardia lamblia NOT DETECTED NOT DETECTED Final   Adenovirus F40/41 NOT DETECTED NOT DETECTED Final   Astrovirus NOT DETECTED NOT DETECTED Final   Norovirus GI/GII NOT DETECTED NOT DETECTED Final   Rotavirus A NOT DETECTED NOT DETECTED Final   Sapovirus (I, II, IV, and V) NOT DETECTED NOT DETECTED Final    Comment: Performed at [facility], [address], [place], [place] [address]         Radiology  Studies: No results found.      Scheduled Meds:  vitamin C  500 mg Oral BID   Chlorhexidine Gluconate Cloth  6 each Topical Daily   copper  8 mg Oral Daily   feeding supplement (KATE FARMS STANDARD 1.4)  325 mL Oral TID BM   feeding supplement (PROSource TF20)  60 mL Per Tube QID   folic acid  1 mg Oral Daily   heparin injection (subcutaneous)  5,000 Units Subcutaneous Q8H   insulin aspart  0-15 Units Subcutaneous TID WC   lactase  6,000 Units Oral TID WC   levothyroxine  100 mcg Oral Q0600   lipase/protease/amylase  24,000 Units Oral TID AC   mirtazapine  15 mg Oral QHS   multivitamin  15 mL Oral Daily   pantoprazole (PROTONIX) IV  40 mg Intravenous Q24H   rifaximin  550 mg Oral TID   thiamine  100 mg Oral Daily   vitamin A  10,000 Units Oral Daily   Vitamin D (Ergocalciferol)  50,000 Units Oral Q7 days   Vitamin E  400 Units Oral Daily   zinc sulfate  220 mg Oral Daily   Continuous Infusions:  albumin human 12.5 g (05/04/23 1001)     LOS: 13 days   Tresa Moore, MD [facility]   If 7PM-7AM, please contact night-coverage  05/04/2023, 12:50 PM

## 2023-05-04 NOTE — Progress Notes (Signed)
[facility], [place] 05/04/23  Subjective:   Hospital day # 13  Sitting up in bed Flat affect, soft spoken Appetite remains decreased.   Renal: 05/05 0701 - 05/06 0700 In: 819.5 [P.O.:520; Blood:299.5] Out: 850 [Urine:850] Lab Results  Component Value Date   CREATININE 2.42 (H) 05/04/2023   CREATININE 2.16 (H) 05/03/2023   CREATININE 2.13 (H) 05/02/2023     Objective:  Vital signs in last 24 hours:  Temp:  [97.6 F (36.4 C)-98.8 F (37.1 C)] 97.6 F (36.4 C) (05/06 1000) Pulse Rate:  [100-115] 109 (05/06 0600) Resp:  [17-22] 19 (05/06 0600) BP: (105-126)/(74-87) 126/87 (05/06 0600) SpO2:  [97 %-100 %] 100 % (05/06 0600)  Weight change:  Filed Weights   04/28/23 0321 04/29/23 0500 04/30/23 0500  Weight: (!) 172.2 kg (!) 169 kg (!) 169.4 kg    Intake/Output:    Intake/Output Summary (Last 24 hours) at 05/04/2023 1030 Last data filed at 05/04/2023 0630 Gross per 24 hour  Intake 819.5 ml  Output 850 ml  Net -30.5 ml      Physical Exam: General: Super morbidly obese gentleman, laying in the bed  HEENT Moist oral mucous membranes  Pulm/lungs Normal breathing effort room air  CVS/Heart No rub  Abdomen:  Obese, nontender  Extremities: Significant dependent peripheral edema on the thighs  Neurologic: Alert, oriented  Skin: Lichenified skin over calves and lower thighs  Dialysis Access: None at present       Basic Metabolic Panel:  Recent Labs  Lab 04/30/23 0450 05/01/23 0535 05/02/23 0500 05/02/23 0757 05/03/23 0500 05/04/23 0500  NA 130* 132*  --  133* 134* 133*  K 3.6 3.6  --  2.8* 2.9* 3.2*  CL 103 106  --  106 110 107  CO2 16* 16*  --  15* 18* 16*  GLUCOSE 94 107*  --  100* 106* 107*  BUN 46* 50*  --  47* 55* 59*  CREATININE 1.61* 1.91*  --  2.13* 2.16* 2.42*  CALCIUM 8.2* 8.4*  --  8.3* 7.7* 8.2*  MG  --   --   --   --  2.0  --   PHOS 3.2 3.1  3.0 2.7  --  2.7 3.0      CBC: Recent Labs  Lab  04/30/23 0450 05/01/23 0535 05/02/23 0500 05/03/23 0500 05/04/23 0500  WBC 24.1* 31.3* 22.6* 17.5* 15.6*  NEUTROABS 20.2* 27.7* 20.1* 15.1* 13.3*  HGB 8.8* 8.5* 7.5* 6.7* 7.5*  HCT 26.5* 25.4* 22.3* 20.1* 22.7*  MCV 82.6 82.5 82.6 83.8 85.0  PLT 393 353 301 285 291      No results found for: "HEPBSAG", "HEPBSAB", "HEPBIGM"    Microbiology:  Recent Results (from the past 240 hour(s))  Culture, blood (Routine X 2) w Reflex to ID Panel     Status: None   Collection Time: 04/28/23  7:05 AM   Specimen: BLOOD  Result Value Ref Range Status   Specimen Description BLOOD  RIGHT ARM  Final   Special Requests   Final    BOTTLES DRAWN AEROBIC AND ANAEROBIC Blood Culture adequate volume   Culture   Final    NO GROWTH 5 DAYS Performed at [facility], [address] [place], [place] [address]    Report Status 05/03/2023 FINAL  Final  Culture, blood (Routine X 2) w Reflex to ID Panel     Status: None (Preliminary result)   Collection Time: 04/28/23  7:05 AM   Specimen:  BLOOD  Result Value Ref Range Status   Specimen Description BLOOD  LEFT AC  Final   Special Requests   Final    BOTTLES DRAWN AEROBIC AND ANAEROBIC Blood Culture results may not be optimal due to an excessive volume of blood received in culture bottles   Culture  Setup Time PENDING  Incomplete   Culture   Final    NO GROWTH 5 DAYS Performed at [facility], [address], [place], [place] [address]    Report Status PENDING  Incomplete  Gastrointestinal Panel by PCR , Stool     Status: None   Collection Time: 04/30/23  5:50 AM   Specimen: Stool  Result Value Ref Range Status   Campylobacter species NOT DETECTED NOT DETECTED Final   Plesimonas shigelloides NOT DETECTED NOT DETECTED Final   Salmonella species NOT DETECTED NOT DETECTED Final   Yersinia enterocolitica NOT DETECTED NOT DETECTED Final   Vibrio species NOT DETECTED NOT DETECTED Final   Vibrio cholerae NOT DETECTED NOT DETECTED  Final   Enteroaggregative E coli (EAEC) NOT DETECTED NOT DETECTED Final   Enteropathogenic E coli (EPEC) NOT DETECTED NOT DETECTED Final   Enterotoxigenic E coli (ETEC) NOT DETECTED NOT DETECTED Final   Shiga like toxin producing E coli (STEC) NOT DETECTED NOT DETECTED Final   Shigella/Enteroinvasive E coli (EIEC) NOT DETECTED NOT DETECTED Final   Cryptosporidium NOT DETECTED NOT DETECTED Final   Cyclospora cayetanensis NOT DETECTED NOT DETECTED Final   Entamoeba histolytica NOT DETECTED NOT DETECTED Final   Giardia lamblia NOT DETECTED NOT DETECTED Final   Adenovirus F40/41 NOT DETECTED NOT DETECTED Final   Astrovirus NOT DETECTED NOT DETECTED Final   Norovirus GI/GII NOT DETECTED NOT DETECTED Final   Rotavirus A NOT DETECTED NOT DETECTED Final   Sapovirus (I, II, IV, and V) NOT DETECTED NOT DETECTED Final    Comment: Performed at [facility], [address] [place], [place] [address]    Coagulation Studies: No results for input(s): "LABPROT", "INR" in the last 72 hours.  Urinalysis: No results for input(s): "COLORURINE", "LABSPEC", "PHURINE", "GLUCOSEU", "HGBUR", "BILIRUBINUR", "KETONESUR", "PROTEINUR", "UROBILINOGEN", "NITRITE", "LEUKOCYTESUR" in the last 72 hours.  Invalid input(s): "APPERANCEUR"    Imaging: No results found.   Medications:    albumin human 12.5 g (05/04/23 1001)    vitamin C  500 mg Oral BID   Chlorhexidine Gluconate Cloth  6 each Topical Daily   copper  8 mg Oral Daily   feeding supplement (KATE FARMS STANDARD 1.4)  325 mL Oral TID BM   feeding supplement (PROSource TF20)  60 mL Per Tube QID   folic acid  1 mg Oral Daily   heparin injection (subcutaneous)  5,000 Units Subcutaneous Q8H   insulin aspart  0-15 Units Subcutaneous TID WC   lactase  6,000 Units Oral TID WC   levothyroxine  100 mcg Oral Q0600   lipase/protease/amylase  24,000 Units Oral TID AC   mirtazapine  15 mg Oral QHS   multivitamin  15 mL Oral Daily   pantoprazole  (PROTONIX) IV  40 mg Intravenous Q24H   rifaximin  550 mg Oral TID   thiamine  100 mg Oral Daily   vitamin A  10,000 Units Oral Daily   Vitamin D (Ergocalciferol)  50,000 Units Oral Q7 days   Vitamin E  400 Units Oral Daily   zinc sulfate  220 mg Oral Daily   acetaminophen, bismuth subsalicylate, loperamide, polyethylene glycol  Assessment/ Plan:  47 y.o.  male with super morbid obesity, hypertension, obstructive sleep apnea, lymphedema, history of bariatric surgery, cholelithiasis, severe hypothyroidism, bedridden status, history of IVC filter placement admitted on 04/21/2023 for Severe hypothyroidism [E03.9] Generalized weakness [R53.1] Lower limb ulcer, ankle, right, with unspecified severity (HCC) [L97.319] Hypothyroidism, unspecified type [E03.9] Sepsis, due to unspecified organism, unspecified whether acute organ dysfunction present [facility]) [A41.9]   Acute kidney injury Likely secondary to hemodynamic alterations caused by septic shock and hypotension and possibly Vancomycin toxicity leading to ATN. Patient is nonoliguric. Creatinine continues to slowly rise. UOP overnight. No acute indication for dialysis.   Lab Results  Component Value Date   CREATININE 2.42 (H) 05/04/2023   BUN 59 (H) 05/04/2023   NA 133 (L) 05/04/2023   K 3.2 (L) 05/04/2023   CL 107 05/04/2023   CO2 16 (L) 05/04/2023     Generalized edema Patient has hypoalbuminemia and malnutrition. Continue daily IV albumin 12.5g for fluid mobilization. Consuming protein supplementation   Hyponatremia Likely secondary to AKI and generalized volume overload. Stable at 133  Hypotension, received pressor support in ICU. Midodrine stopped.  Blood pressure improved to 126/87  Chronic systolic CHF LVEF 35 to 40%, moderately decreased function, global hypokinesis, grade 1 diastolic dysfunction, normal right ventricular systolic function from echo 04/22/2023.  Acute metabolic acidosis, serum bicarb 16. Oral  supplementation stopped.     LOS: 13 Wendee Beavers 5/6/202410:30 AM  [address] [place], [place] [phone]

## 2023-05-05 DIAGNOSIS — Z7189 Other specified counseling: Secondary | ICD-10-CM | POA: Diagnosis not present

## 2023-05-05 DIAGNOSIS — E039 Hypothyroidism, unspecified: Secondary | ICD-10-CM | POA: Diagnosis not present

## 2023-05-05 LAB — BASIC METABOLIC PANEL
Anion gap: 10 (ref 5–15)
BUN: 63 mg/dL — ABNORMAL HIGH (ref 6–20)
CO2: 17 mmol/L — ABNORMAL LOW (ref 22–32)
Calcium: 8.2 mg/dL — ABNORMAL LOW (ref 8.9–10.3)
Chloride: 107 mmol/L (ref 98–111)
Creatinine, Ser: 2.64 mg/dL — ABNORMAL HIGH (ref 0.61–1.24)
GFR, Estimated: 30 mL/min — ABNORMAL LOW (ref >60)
Glucose, Bld: 95 mg/dL (ref 70–99)
Potassium: 2.7 mmol/L — CL (ref 3.5–5.1)
Sodium: 134 mmol/L — ABNORMAL LOW (ref 135–145)

## 2023-05-05 LAB — CBC WITH DIFFERENTIAL/PLATELET
Abs Immature Granulocytes: 0.19 10*3/uL — ABNORMAL HIGH (ref 0.00–0.07)
Basophils Absolute: 0 10*3/uL (ref 0.0–0.1)
Basophils Relative: 0 %
Eosinophils Absolute: 0 10*3/uL (ref 0.0–0.5)
Eosinophils Relative: 0 %
HCT: 22.9 % — ABNORMAL LOW (ref 39.0–52.0)
Hemoglobin: 7.6 g/dL — ABNORMAL LOW (ref 13.0–17.0)
Immature Granulocytes: 1 %
Lymphocytes Relative: 12 %
Lymphs Abs: 2 10*3/uL (ref 0.7–4.0)
MCH: 28 pg (ref 26.0–34.0)
MCHC: 33.2 g/dL (ref 30.0–36.0)
MCV: 84.5 fL (ref 80.0–100.0)
Monocytes Absolute: 0.7 10*3/uL (ref 0.1–1.0)
Monocytes Relative: 4 %
Neutro Abs: 13.3 10*3/uL — ABNORMAL HIGH (ref 1.7–7.7)
Neutrophils Relative %: 83 %
Platelets: 287 10*3/uL (ref 150–400)
RBC: 2.71 MIL/uL — ABNORMAL LOW (ref 4.22–5.81)
RDW: 20.3 % — ABNORMAL HIGH (ref 11.5–15.5)
WBC: 16.1 10*3/uL — ABNORMAL HIGH (ref 4.0–10.5)
nRBC: 0 % (ref 0.0–0.2)

## 2023-05-05 LAB — GLUCOSE, CAPILLARY
Glucose-Capillary: 92 mg/dL (ref 70–99)
Glucose-Capillary: 95 mg/dL (ref 70–99)
Glucose-Capillary: 103 mg/dL — ABNORMAL HIGH (ref 70–99)

## 2023-05-05 LAB — PHOSPHORUS: Phosphorus: 3 mg/dL (ref 2.5–4.6)

## 2023-05-05 MED ORDER — POTASSIUM CHLORIDE CRYS ER 20 MEQ PO TBCR
40.0000 meq | EXTENDED_RELEASE_TABLET | ORAL | Status: AC
  Administered 2023-05-05 (×3): 40 meq via ORAL
  Filled 2023-05-05 (×2): qty 2

## 2023-05-05 MED ORDER — POTASSIUM CHLORIDE CRYS ER 20 MEQ PO TBCR: 40.0000 meq | EXTENDED_RELEASE_TABLET | Freq: Four times a day (QID) | ORAL | Status: DC

## 2023-05-05 MED ORDER — PANTOPRAZOLE SODIUM 40 MG PO TBEC
40.0000 mg | DELAYED_RELEASE_TABLET | Freq: Every day | ORAL | Status: DC
  Administered 2023-05-05 – 2023-05-22 (×18): 40 mg via ORAL
  Filled 2023-05-05 (×18): qty 1

## 2023-05-05 MED ORDER — POTASSIUM CHLORIDE CRYS ER 20 MEQ PO TBCR: 40.0000 meq | EXTENDED_RELEASE_TABLET | Freq: Once | ORAL | Status: DC

## 2023-05-05 NOTE — Progress Notes (Addendum)
[facility], [place] 05/05/23  Subjective:   Hospital day # 14  Patient seen resting quietly Denies pain or discomfort Reports improvement in appetite   Renal: 05/06 0701 - 05/07 0700 In: 240 [P.O.:240] Out: -  Lab Results  Component Value Date   CREATININE 2.64 (H) 05/05/2023   CREATININE 2.42 (H) 05/04/2023   CREATININE 2.16 (H) 05/03/2023     Objective:  Vital signs in last 24 hours:  Temp:  [97.6 F (36.4 C)-98.2 F (36.8 C)] 97.7 F (36.5 C) (05/07 1228) Pulse Rate:  [94-115] 115 (05/07 1228) Resp:  [12-20] 20 (05/07 1228) BP: (102-118)/(73-86) 102/74 (05/07 1228) SpO2:  [100 %] 100 % (05/07 1228)  Weight change:  Filed Weights   04/28/23 0321 04/29/23 0500 04/30/23 0500  Weight: (!) 172.2 kg (!) 169 kg (!) 169.4 kg    Intake/Output:    Intake/Output Summary (Last 24 hours) at 05/05/2023 1316 Last data filed at 05/05/2023 0011 Gross per 24 hour  Intake 240 ml  Output --  Net 240 ml      Physical Exam: General: morbidly obese gentleman, laying in the bed  HEENT Moist oral mucous membranes  Pulm/lungs Normal breathing effort room air  CVS/Heart No rub  Abdomen:  Obese, nontender  Extremities: Significant dependent peripheral edema on the thighs  Neurologic: Alert, oriented  Skin: Lichenified skin over calves and lower thighs  Dialysis Access: None at present       Basic Metabolic Panel:  Recent Labs  Lab 05/01/23 0535 05/02/23 0500 05/02/23 0757 05/03/23 0500 05/04/23 0500 05/05/23 0550  NA 132*  --  133* 134* 133* 134*  K 3.6  --  2.8* 2.9* 3.2* 2.7*  CL 106  --  106 110 107 107  CO2 16*  --  15* 18* 16* 17*  GLUCOSE 107*  --  100* 106* 107* 95  BUN 50*  --  47* 55* 59* 63*  CREATININE 1.91*  --  2.13* 2.16* 2.42* 2.64*  CALCIUM 8.4*  --  8.3* 7.7* 8.2* 8.2*  MG  --   --   --  2.0  --   --   PHOS 3.1  3.0 2.7  --  2.7 3.0 3.0      CBC: Recent Labs  Lab 05/01/23 0535 05/02/23 0500 05/03/23 0500  05/04/23 0500 05/05/23 0550  WBC 31.3* 22.6* 17.5* 15.6* 16.1*  NEUTROABS 27.7* 20.1* 15.1* 13.3* 13.3*  HGB 8.5* 7.5* 6.7* 7.5* 7.6*  HCT 25.4* 22.3* 20.1* 22.7* 22.9*  MCV 82.5 82.6 83.8 85.0 84.5  PLT 353 301 285 291 287      No results found for: "HEPBSAG", "HEPBSAB", "HEPBIGM"    Microbiology:  Recent Results (from the past 240 hour(s))  Culture, blood (Routine X 2) w Reflex to ID Panel     Status: None   Collection Time: 04/28/23  7:05 AM   Specimen: BLOOD  Result Value Ref Range Status   Specimen Description BLOOD  RIGHT ARM  Final   Special Requests   Final    BOTTLES DRAWN AEROBIC AND ANAEROBIC Blood Culture adequate volume   Culture   Final    NO GROWTH 5 DAYS Performed at [facility], [address] [place], [place] [address]    Report Status 05/03/2023 FINAL  Final  Culture, blood (Routine X 2) w Reflex to ID Panel     Status: None   Collection Time: 04/28/23  7:05 AM   Specimen: BLOOD  Result Value Ref  Range Status   Specimen Description BLOOD  LEFT AC  Final   Special Requests   Final    BOTTLES DRAWN AEROBIC AND ANAEROBIC Blood Culture results may not be optimal due to an excessive volume of blood received in culture bottles   Culture   Final    NO GROWTH 5 DAYS Performed at [facility], [address] [place], [place] [address]    Report Status 05/05/2023 FINAL  Final  Gastrointestinal Panel by PCR , Stool     Status: None   Collection Time: 04/30/23  5:50 AM   Specimen: Stool  Result Value Ref Range Status   Campylobacter species NOT DETECTED NOT DETECTED Final   Plesimonas shigelloides NOT DETECTED NOT DETECTED Final   Salmonella species NOT DETECTED NOT DETECTED Final   Yersinia enterocolitica NOT DETECTED NOT DETECTED Final   Vibrio species NOT DETECTED NOT DETECTED Final   Vibrio cholerae NOT DETECTED NOT DETECTED Final   Enteroaggregative E coli (EAEC) NOT DETECTED NOT DETECTED Final   Enteropathogenic E coli (EPEC)  NOT DETECTED NOT DETECTED Final   Enterotoxigenic E coli (ETEC) NOT DETECTED NOT DETECTED Final   Shiga like toxin producing E coli (STEC) NOT DETECTED NOT DETECTED Final   Shigella/Enteroinvasive E coli (EIEC) NOT DETECTED NOT DETECTED Final   Cryptosporidium NOT DETECTED NOT DETECTED Final   Cyclospora cayetanensis NOT DETECTED NOT DETECTED Final   Entamoeba histolytica NOT DETECTED NOT DETECTED Final   Giardia lamblia NOT DETECTED NOT DETECTED Final   Adenovirus F40/41 NOT DETECTED NOT DETECTED Final   Astrovirus NOT DETECTED NOT DETECTED Final   Norovirus GI/GII NOT DETECTED NOT DETECTED Final   Rotavirus A NOT DETECTED NOT DETECTED Final   Sapovirus (I, II, IV, and V) NOT DETECTED NOT DETECTED Final    Comment: Performed at [facility], [address] [place], [place] [address]    Coagulation Studies: No results for input(s): "LABPROT", "INR" in the last 72 hours.  Urinalysis: No results for input(s): "COLORURINE", "LABSPEC", "PHURINE", "GLUCOSEU", "HGBUR", "BILIRUBINUR", "KETONESUR", "PROTEINUR", "UROBILINOGEN", "NITRITE", "LEUKOCYTESUR" in the last 72 hours.  Invalid input(s): "APPERANCEUR"    Imaging: No results found.   Medications:    albumin human 12.5 g (05/05/23 1003)    vitamin C  500 mg Oral BID   Chlorhexidine Gluconate Cloth  6 each Topical Daily   copper  8 mg Oral Daily   feeding supplement (KATE FARMS STANDARD 1.4)  325 mL Oral TID BM   feeding supplement (PROSource TF20)  60 mL Per Tube QID   folic acid  1 mg Oral Daily   heparin injection (subcutaneous)  5,000 Units Subcutaneous Q8H   insulin aspart  0-15 Units Subcutaneous TID WC   lactase  6,000 Units Oral TID WC   levothyroxine  100 mcg Oral Q0600   lipase/protease/amylase  24,000 Units Oral TID AC   mirtazapine  15 mg Oral QHS   multivitamin  15 mL Oral Daily   pantoprazole  40 mg Oral QHS   rifaximin  550 mg Oral TID   thiamine  100 mg Oral Daily   vitamin A  10,000 Units Oral  Daily   Vitamin D (Ergocalciferol)  50,000 Units Oral Q7 days   Vitamin E  400 Units Oral Daily   zinc sulfate  220 mg Oral Daily   acetaminophen, bismuth subsalicylate, loperamide, polyethylene glycol  Assessment/ Plan:  47 y.o. male with super morbid obesity, hypertension, obstructive sleep apnea, lymphedema, history of bariatric surgery, cholelithiasis,  severe hypothyroidism, bedridden status, history of IVC filter placement admitted on 04/21/2023 for Severe hypothyroidism [E03.9] Generalized weakness [R53.1] Lower limb ulcer, ankle, right, with unspecified severity (HCC) [L97.319] Hypothyroidism, unspecified type [E03.9] Sepsis, due to unspecified organism, unspecified whether acute organ dysfunction present [facility]) [A41.9]   Acute kidney injury Likely secondary to hemodynamic alterations caused by septic shock and hypotension and possibly Vancomycin toxicity leading to ATN. May have a component of infectious GN. Serologies ordered.  Patient is nonoliguric. Creatinine elevated, adequate urine output  Will require follow up in our office at discharge.  Lab Results  Component Value Date   CREATININE 2.64 (H) 05/05/2023   BUN 63 (H) 05/05/2023   NA 134 (L) 05/05/2023   K 2.7 (LL) 05/05/2023   CL 107 05/05/2023   CO2 17 (L) 05/05/2023     Generalized edema Patient has hypoalbuminemia and malnutrition. Continue daily IV albumin 12.5g for fluid mobilization. Consuming protein supplementation   Hyponatremia Likely secondary to AKI and generalized volume overload. Sodium 134  Hypotension, received pressor support in ICU. Midodrine stopped.  Blood pressure reduced but acceptable, 102/74  Chronic systolic CHF LVEF 35 to 40%, moderately decreased function, global hypokinesis, grade 1 diastolic dysfunction, normal right ventricular systolic function from echo 04/22/2023.  Acute metabolic acidosis, serum bicarb 17 today. Oral supplementation stopped.   Hypokalemia, Potassium 2.7,   Primary team has ordered oral supplementation.     LOS: 14 Wendee Beavers 5/7/20241:16 PM  [facility] [address] [place], [place] [phone]

## 2023-05-05 NOTE — Progress Notes (Signed)
PHARMACIST - PHYSICIAN COMMUNICATION  CONCERNING: IV to Oral Route Change Policy  RECOMMENDATION: This patient is receiving pantoprazole by the intravenous route.  Based on criteria approved by the Pharmacy and Therapeutics Committee, the intravenous medication(s) is/are being converted to the equivalent oral dose form(s).   DESCRIPTION: These criteria include: The patient is eating (either orally or via tube) and/or has been taking other orally administered medications for a least 24 hours The patient has no evidence of active gastrointestinal bleeding or impaired GI absorption (gastrectomy, short bowel, patient on TNA or NPO).  If you have questions about this conversion, please contact the Pharmacy Department  []   [phone] )  [facility] [x]   [phone] )  [facility] []   [phone] )  [facility] []   [phone] )  [place] [facility] []   [phone] )  [facility]   Orson Aloe, [facility] 05/05/2023 7:52 AM

## 2023-05-05 NOTE — Progress Notes (Signed)
Lab called in a critical potassium of 2.6 this a.m. Bishop Limbo NP notified.

## 2023-05-05 NOTE — Progress Notes (Signed)
PROGRESS NOTE    Christopher Herman  ZOX:[id number] DOB: [birth year] DOA: 04/21/2023 PCP: Pcp, No    Brief Narrative:  47 y.o. male with a PMH significant for severe obesity s/p bariatric surgery, OSA, hypoventilation syndrome, HTN, GERD, cholelithiasis, bilateral extremity lipoma, chronic lymphedema status post IVC filter placement, IDA.   They presented from home to the ED on 04/21/2023 with generalized weakness, extreme fatigue, unintended weight loss, disuse of upper extremities and poor p.o. intake  x a few days. He is chronically bed bound and malnourished.    They were initially treated with vancomycin, cefepime, metronidazole.  They were admitted to the ICU for septic shock with hypotension resistant to bolus and required vasopressors.   5/6: Maps improved.  Midodrine stopped 5/6.  Hydrocortisone to complete course today.  5/7: Blood pressure stable off support agents   Assessment & Plan:   Principal Problem:   Severe hypothyroidism Active Problems:   Malnutrition of moderate degree (HCC)   Generalized weakness   Ulcer of right ankle (HCC)   Hypotension due to hypovolemia   Acute renal failure due to tubular necrosis (HCC)   Hypothyroidism  SEPTIC shock SOURCE-UTI Patient has multiple sources of infection including urine and bilateral lower extremity cellulitis.  Blood pressure is now stable off any support agents.  Midodrine and hydrocortisone completed courses. Plan: Sepsis/shock physiology has resolved.  No indication to restart antibiotics.  Midodrine and hydrocortisone have been discontinued.  Severe Hypothyroidism- Last TSH, Free T4 check was 11 years ago with normal TSH TSH: 9.134 Free T4:1.34 Am cortisol 4/23 WNL -s/p IV Synthroid 250 mcg followed by 100 mcg daily. Repeat TSH on 4/25 is 1.393 Plan: Continue Synthroid 100 mcg daily No indication to resume hydrocortisone Will need repeat TSH in a few weeks   Acute renal  failure- Hyponatremia hypokalemia Renal US neg for hydronephrosis or obstruction. Foley removed 4/26.  Urine output remains adequate Plan: Nephrology consulted.  Currently no indication for inpatient hemodialysis.  Creatinine slowly worsening.  Encourage p.o. intake.  Patient nonoliguric.  Considering ATN from vancomycin exposure.  Daily renal function   Thrombocytosis  Significant leukocytosis  diff showing abs neutrophil predominance.Morphology unremarkable on pathology report. Plan: White count tends to be chronically elevated but overall trending down   Anemia of chronic disease-transfused 1u pRBCs 4/29.  Monitor and replace as necessary.  Hemoglobin 7.6 on 5/7   Morbid obesity  malnutrition s/p bariatric surgery  chronic diarrhea Patient has had issues with chronic diarrhea.  Discussed with RD at length.  Appears to be eating more.  Frequency of diarrhea has decreased.  Oral intake remains poor but overall improving.  Patient seems motivated.     Sinus tachycardia  prolonged Qtc  501. HR 110s and ECG showing sinus. Asymptomatic - avoid Qtc prolonging agents.  Midodrine discontinued on 5/6.  Heart rate appears to be improving.   Bed-bound - PT/OT evaluation and therapy - frequent turning -Patient will likely need long-term recovery.  May be an appropriate candidate for long-term care versus LTAC placement.  TOC aware  Depression Patient endorsed to both me as well as palliative care feeling depressed.  This is likely impacting his oral intake and unfortunately delaying wound healing.  Initiated Remeron per RD recommendations.  Official consult to psychiatry placed 5/1.  Recommendations appreciated  DVT prophylaxis: SQ heparin Code Status: Full Family Communication: Mother via phone 5/5 Disposition Plan: Status is: Inpatient Remains inpatient appropriate because: Resolving septic shock   Level of care: Progressive  Consultants:  Nephrology Psychiatry Palliative  care ID  Procedures:  None  Antimicrobials:   Subjective: Seen and examined.  Stable.  No appreciable change  Objective: Vitals:   05/05/23 0011 05/05/23 0400 05/05/23 0500 05/05/23 0759  BP: 118/86 118/86  110/75  Pulse: 100 98 94 (!) 110  Resp: 18 15 12 18   Temp: 97.6 F (36.4 C)  97.7 F (36.5 C) 98.2 F (36.8 C)  TempSrc: Oral  Oral Oral  SpO2: 100% 100% 100% 100%  Weight:      Height:        Intake/Output Summary (Last 24 hours) at 05/05/2023 1115 Last data filed at 05/05/2023 0011 Gross per 24 hour  Intake 240 ml  Output --  Net 240 ml   Filed Weights   04/28/23 0321 04/29/23 0500 04/30/23 0500  Weight: (!) 172.2 kg (!) 169 kg (!) 169.4 kg    Examination:  General exam: Chronically ill-appearing.  Weak and fatigued Respiratory system: Poor respiratory effort.  Normal work of breathing.  Room air Cardiovascular system: S1-S2, tachycardic, regular rhythm, no murmurs, marked bilateral lymphedema Gastrointestinal system: Obese, NT/ND, normal bowel sounds Central nervous system: Alert and oriented. No focal neurological deficits. Extremities: Markedly decreased power bilateral upper and lower extremities Skin: No rashes, lesions or ulcers Psychiatry: Judgement and insight appear impaired. Mood & affect flattened.     Data Reviewed: I have personally reviewed following labs and imaging studies  CBC: Recent Labs  Lab 05/01/23 0535 05/02/23 0500 05/03/23 0500 05/04/23 0500 05/05/23 0550  WBC 31.3* 22.6* 17.5* 15.6* 16.1*  NEUTROABS 27.7* 20.1* 15.1* 13.3* 13.3*  HGB 8.5* 7.5* 6.7* 7.5* 7.6*  HCT 25.4* 22.3* 20.1* 22.7* 22.9*  MCV 82.5 82.6 83.8 85.0 84.5  PLT 353 301 285 291 287   Basic Metabolic Panel: Recent Labs  Lab 05/01/23 0535 05/02/23 0500 05/02/23 0757 05/03/23 0500 05/04/23 0500 05/05/23 0550  NA 132*  --  133* 134* 133* 134*  K 3.6  --  2.8* 2.9* 3.2* 2.7*  CL 106  --  106 110 107 107  CO2 16*  --  15* 18* 16* 17*  GLUCOSE 107*   --  100* 106* 107* 95  BUN 50*  --  47* 55* 59* 63*  CREATININE 1.91*  --  2.13* 2.16* 2.42* 2.64*  CALCIUM 8.4*  --  8.3* 7.7* 8.2* 8.2*  MG  --   --   --  2.0  --   --   PHOS 3.1  3.0 2.7  --  2.7 3.0 3.0   GFR: Estimated Creatinine Clearance: 52.3 mL/min (A) (by C-G formula based on SCr of 2.64 mg/dL (H)). Liver Function Tests: Recent Labs  Lab 04/29/23 0602 04/30/23 0450 05/01/23 0535  ALBUMIN 2.6* 2.7* 2.5*   No results for input(s): "LIPASE", "AMYLASE" in the last 168 hours. No results for input(s): "AMMONIA" in the last 168 hours. Coagulation Profile: No results for input(s): "INR", "PROTIME" in the last 168 hours. Cardiac Enzymes: No results for input(s): "CKTOTAL", "CKMB", "CKMBINDEX", "TROPONINI" in the last 168 hours. BNP (last 3 results) No results for input(s): "PROBNP" in the last 8760 hours. HbA1C: No results for input(s): "HGBA1C" in the last 72 hours. CBG: Recent Labs  Lab 05/03/23 1802 05/04/23 0810 05/04/23 1204 05/04/23 1559 05/05/23 0809  GLUCAP 97 96 107* 138* 92   Lipid Profile: No results for input(s): "CHOL", "HDL", "LDLCALC", "TRIG", "CHOLHDL", "LDLDIRECT" in the last 72 hours. Thyroid Function Tests: No results for input(s): "TSH", "T4TOTAL", "FREET4", "T3FREE", "THYROIDAB"  in the last 72 hours. Anemia Panel: No results for input(s): "VITAMINB12", "FOLATE", "FERRITIN", "TIBC", "IRON", "RETICCTPCT" in the last 72 hours. Sepsis Labs: No results for input(s): "PROCALCITON", "LATICACIDVEN" in the last 168 hours.  Recent Results (from the past 240 hour(s))  Culture, blood (Routine X 2) w Reflex to ID Panel     Status: None   Collection Time: 04/28/23  7:05 AM   Specimen: BLOOD  Result Value Ref Range Status   Specimen Description BLOOD  RIGHT ARM  Final   Special Requests   Final    BOTTLES DRAWN AEROBIC AND ANAEROBIC Blood Culture adequate volume   Culture   Final    NO GROWTH 5 DAYS Performed at [facility], [address] [place], [place] [address]    Report Status 05/03/2023 FINAL  Final  Culture, blood (Routine X 2) w Reflex to ID Panel     Status: None   Collection Time: 04/28/23  7:05 AM   Specimen: BLOOD  Result Value Ref Range Status   Specimen Description BLOOD  LEFT AC  Final   Special Requests   Final    BOTTLES DRAWN AEROBIC AND ANAEROBIC Blood Culture results may not be optimal due to an excessive volume of blood received in culture bottles   Culture   Final    NO GROWTH 5 DAYS Performed at [facility], [name] [place], [place] [address]    Report Status 05/05/2023 FINAL  Final  Gastrointestinal Panel by PCR , Stool     Status: None   Collection Time: 04/30/23  5:50 AM   Specimen: Stool  Result Value Ref Range Status   Campylobacter species NOT DETECTED NOT DETECTED Final   Plesimonas shigelloides NOT DETECTED NOT DETECTED Final   Salmonella species NOT DETECTED NOT DETECTED Final   Yersinia enterocolitica NOT DETECTED NOT DETECTED Final   Vibrio species NOT DETECTED NOT DETECTED Final   Vibrio cholerae NOT DETECTED NOT DETECTED Final   Enteroaggregative E coli (EAEC) NOT DETECTED NOT DETECTED Final   Enteropathogenic E coli (EPEC) NOT DETECTED NOT DETECTED Final   Enterotoxigenic E coli (ETEC) NOT DETECTED NOT DETECTED Final   Shiga like toxin producing E coli (STEC) NOT DETECTED NOT DETECTED Final   Shigella/Enteroinvasive E coli (EIEC) NOT DETECTED NOT DETECTED Final   Cryptosporidium NOT DETECTED NOT DETECTED Final   Cyclospora cayetanensis NOT DETECTED NOT DETECTED Final   Entamoeba histolytica NOT DETECTED NOT DETECTED Final   Giardia lamblia NOT DETECTED NOT DETECTED Final   Adenovirus F40/41 NOT DETECTED NOT DETECTED Final   Astrovirus NOT DETECTED NOT DETECTED Final   Norovirus GI/GII NOT DETECTED NOT DETECTED Final   Rotavirus A NOT DETECTED NOT DETECTED Final   Sapovirus (I, II, IV, and V) NOT DETECTED NOT DETECTED Final    Comment: Performed at [facility], [address], [place], [place] [address]         Radiology Studies: No results found.      Scheduled Meds:  vitamin C  500 mg Oral BID   Chlorhexidine Gluconate Cloth  6 each Topical Daily   copper  8 mg Oral Daily   feeding supplement (KATE FARMS STANDARD 1.4)  325 mL Oral TID BM   feeding supplement (PROSource TF20)  60 mL Per Tube QID   folic acid  1 mg Oral Daily   heparin injection (subcutaneous)  5,000 Units Subcutaneous Q8H   insulin aspart  0-15 Units Subcutaneous TID WC  lactase  6,000 Units Oral TID WC   levothyroxine  100 mcg Oral Q0600   lipase/protease/amylase  24,000 Units Oral TID AC   mirtazapine  15 mg Oral QHS   multivitamin  15 mL Oral Daily   pantoprazole  40 mg Oral QHS   potassium chloride  40 mEq Oral Q2H   rifaximin  550 mg Oral TID   thiamine  100 mg Oral Daily   vitamin A  10,000 Units Oral Daily   Vitamin D (Ergocalciferol)  50,000 Units Oral Q7 days   Vitamin E  400 Units Oral Daily   zinc sulfate  220 mg Oral Daily   Continuous Infusions:  albumin human 12.5 g (05/05/23 1003)     LOS: 14 days   Tresa Moore, MD [facility]   If 7PM-7AM, please contact night-coverage  05/05/2023, 11:15 AM

## 2023-05-05 NOTE — Consult Note (Signed)
[facility] Customer service manager [facility]) Accountable Care Organization (ACO) [facility] Liaison Note  05/05/2023  [name] 1976-01-27 [id number]  Location: [facility] Hospital Liaison screened the patient remotely at [facility]  Insurance: [employer]   Christopher Herman is a 47 y.o. male who is a Primary Care Patient of Pcp, No. The patient was screened for  readmission hospital 1 IP in 6 months.  The patient was assessed for potential [facility]) Care Management service needs for post hospital transition for care coordination. Review of patient's electronic medical record reveals patient will be long term for institutional care. No current PCP noted via EPIC.    [facility] Care Management/Population Health does not replace or interfere with any arrangements made by the Inpatient Transition of Care team.   For questions contact:   Elliot Cousin, RN, BSN [facility] Liaison [place]   [facility]  Population Health Office Hours MTWF 8:00 am to 6 pm off on Thursday [phone] mobile [phone] [Office toll free line][facility] Office Hours are M-F 8:30 - 5 pm 24 hour nurse advise line [phone] Conceirge  [email or web address]

## 2023-05-05 NOTE — Plan of Care (Signed)
  Problem: Nutrition: Goal: Adequate nutrition will be maintained Outcome: Progressing   Problem: Coping: Goal: Level of anxiety will decrease Outcome: Progressing   Problem: Elimination: Goal: Will not experience complications related to urinary retention Outcome: Progressing   Problem: Education: Goal: Knowledge of General Education information will improve Description: Including pain rating scale, medication(s)/side effects and non-pharmacologic comfort measures Outcome: Not Progressing   Problem: Health Behavior/Discharge Planning: Goal: Ability to manage health-related needs will improve Outcome: Not Progressing   Problem: Clinical Measurements: Goal: Ability to maintain clinical measurements within normal limits will improve Outcome: Not Progressing Goal: Will remain free from infection Outcome: Not Progressing Goal: Diagnostic test results will improve Outcome: Not Progressing Goal: Respiratory complications will improve Outcome: Not Progressing Goal: Cardiovascular complication will be avoided Outcome: Not Progressing   Problem: Activity: Goal: Risk for activity intolerance will decrease Outcome: Not Progressing   Problem: Elimination: Goal: Will not experience complications related to bowel motility Outcome: Not Progressing   Problem: Pain Managment: Goal: General experience of comfort will improve Outcome: Not Progressing   Problem: Safety: Goal: Ability to remain free from injury will improve Outcome: Not Progressing   Problem: Skin Integrity: Goal: Risk for impaired skin integrity will decrease Outcome: Not Progressing   Problem: Education: Goal: Ability to describe self-care measures that may prevent or decrease complications (Diabetes Survival Skills Education) will improve Outcome: Not Progressing Goal: Individualized Educational Video(s) Outcome: Not Progressing   Problem: Coping: Goal: Ability to adjust to condition or change in health will  improve Outcome: Not Progressing   Problem: Fluid Volume: Goal: Ability to maintain a balanced intake and output will improve Outcome: Not Progressing   Problem: Health Behavior/Discharge Planning: Goal: Ability to identify and utilize available resources and services will improve Outcome: Not Progressing Goal: Ability to manage health-related needs will improve Outcome: Not Progressing   Problem: Metabolic: Goal: Ability to maintain appropriate glucose levels will improve Outcome: Not Progressing   Problem: Nutritional: Goal: Maintenance of adequate nutrition will improve Outcome: Not Progressing Goal: Progress toward achieving an optimal weight will improve Outcome: Not Progressing   Problem: Skin Integrity: Goal: Risk for impaired skin integrity will decrease Outcome: Not Progressing   Problem: Tissue Perfusion: Goal: Adequacy of tissue perfusion will improve Outcome: Not Progressing

## 2023-05-05 NOTE — Progress Notes (Signed)
Daily Progress Note   Patient Name: Christopher Herman       Date: 05/05/2023 DOB: October 20, 1976  Age: 47 y.o. MRN#: [id number] Attending Physician: Tresa Moore, MD Primary Care Physician: Pcp, No Admit Date: 04/21/2023  Reason for Consultation/Follow-up: Establishing goals of care  Subjective: Notes and labs reviewed. In to see patient.  He is resting in bed at this time.  He denies complaint.  He states his appetite has improved.  He briefly makes eye contact with me, and returns to watching TV.  PMT will shadow for needs.  Length of Stay: 14  Current Medications: Scheduled Meds:   vitamin C  500 mg Oral BID   Chlorhexidine Gluconate Cloth  6 each Topical Daily   copper  8 mg Oral Daily   feeding supplement (KATE FARMS STANDARD 1.4)  325 mL Oral TID BM   feeding supplement (PROSource TF20)  60 mL Per Tube QID   folic acid  1 mg Oral Daily   heparin injection (subcutaneous)  5,000 Units Subcutaneous Q8H   insulin aspart  0-15 Units Subcutaneous TID WC   lactase  6,000 Units Oral TID WC   levothyroxine  100 mcg Oral Q0600   lipase/protease/amylase  24,000 Units Oral TID AC   mirtazapine  15 mg Oral QHS   multivitamin  15 mL Oral Daily   pantoprazole  40 mg Oral QHS   rifaximin  550 mg Oral TID   thiamine  100 mg Oral Daily   vitamin A  10,000 Units Oral Daily   Vitamin D (Ergocalciferol)  50,000 Units Oral Q7 days   Vitamin E  400 Units Oral Daily   zinc sulfate  220 mg Oral Daily    Continuous Infusions:   PRN Meds: acetaminophen, bismuth subsalicylate, loperamide, polyethylene glycol  Physical Exam Pulmonary:     Effort: Pulmonary effort is normal.  Neurological:     Mental Status: He is alert.             Vital Signs: BP 102/74 (BP Location: Right Arm)    Pulse (!) 115   Temp 97.7 F (36.5 C) (Oral)   Resp 20   Ht 5\' 5"  (1.651 m)   Wt (!) 169.4 kg   SpO2 100%   BMI 62.15 kg/m  SpO2: SpO2: 100 % O2 Device:  O2 Device: Room Air O2 Flow Rate:    Intake/output summary:  Intake/Output Summary (Last 24 hours) at 05/05/2023 1336 Last data filed at 05/05/2023 0011 Gross per 24 hour  Intake 240 ml  Output --  Net 240 ml   LBM: Last BM Date : 05/05/23 Baseline Weight: Weight: (!) 180.1 kg Most recent weight: Weight: (!) 169.4 kg   Patient Active Problem List   Diagnosis Date Noted   Acute renal failure due to tubular necrosis (HCC) 04/26/2023   Hypothyroidism 04/26/2023   Hypotension due to hypovolemia 04/24/2023   Malnutrition of moderate degree (HCC) 04/23/2023   Generalized weakness 04/23/2023   Ulcer of right ankle (HCC) 04/23/2023   Severe hypothyroidism 04/21/2023   Chronic diarrhea of unknown origin 07/11/2022   Muscle spasm of both lower legs 02/20/2022   Severe protein-calorie malnutrition (HCC) 02/20/2022   Diarrhea    Rhabdomyolysis 02/14/2022   Electrolyte abnormality 02/13/2022   AKI (acute kidney injury) (HCC) 02/12/2022   Sepsis (HCC) 02/11/2022   S/P IVC filter 09/11/2015   Cholelithiasis 09/11/2015   Anxiety 09/10/2015   Edema 09/10/2015   Knee pain 09/10/2015   Groin pain 09/10/2015   Vitamin D deficiency 09/10/2015   Obesity, morbid (HCC) 09/10/2015   Anemia, iron deficiency 09/25/2009   Pain in joint, multiple sites 09/22/2009   Essential (primary) hypertension 05/31/2009   Lipoma of lower extremity 05/31/2009   Palpitations 05/31/2009   GERD (gastroesophageal reflux disease) 05/31/2009   Obstructive sleep apnea 12/29/1998    Palliative Care Assessment & Plan    Recommendations/Plan: PMT will shadow for needs  Code Status:    Code Status Orders  (From admission, onward)           Start     Ordered   04/21/23 0352  Full code  Continuous       Question:  By:  Answer:  Consent: discussion  documented in EHR   04/21/23 0352           Code Status History     Date Active Date Inactive Code Status Order ID Comments User Context   02/11/2022 0225 02/17/2022 2059 Full Code [id number]  Mansy, Vernetta Honey, MD ED        Thank you for allowing the Palliative Medicine Team to assist in the care of this patient.   Morton Stall, NP  Please contact Palliative Medicine Team phone at [phone] for questions and concerns.

## 2023-05-06 ENCOUNTER — Inpatient Hospital Stay: Payer: BC Managed Care – PPO

## 2023-05-06 DIAGNOSIS — E039 Hypothyroidism, unspecified: Secondary | ICD-10-CM | POA: Diagnosis not present

## 2023-05-06 LAB — BPAM RBC
Blood Product Expiration Date: 202406082359
Blood Product Expiration Date: 202406132359
ISSUE DATE / TIME: 202405071300
Unit Type and Rh: 5100
Unit Type and Rh: 5100

## 2023-05-06 LAB — CBC WITH DIFFERENTIAL/PLATELET
Abs Immature Granulocytes: 0.17 10*3/uL — ABNORMAL HIGH (ref 0.00–0.07)
Basophils Absolute: 0 10*3/uL (ref 0.0–0.1)
Basophils Relative: 0 %
Eosinophils Absolute: 0.1 10*3/uL (ref 0.0–0.5)
Eosinophils Relative: 1 %
HCT: 21.8 % — ABNORMAL LOW (ref 39.0–52.0)
Hemoglobin: 7.1 g/dL — ABNORMAL LOW (ref 13.0–17.0)
Immature Granulocytes: 1 %
Lymphocytes Relative: 9 %
Lymphs Abs: 1.3 10*3/uL (ref 0.7–4.0)
MCH: 28.1 pg (ref 26.0–34.0)
MCHC: 32.6 g/dL (ref 30.0–36.0)
MCV: 86.2 fL (ref 80.0–100.0)
Monocytes Absolute: 0.8 10*3/uL (ref 0.1–1.0)
Monocytes Relative: 6 %
Neutro Abs: 11.4 10*3/uL — ABNORMAL HIGH (ref 1.7–7.7)
Neutrophils Relative %: 83 %
Platelets: 262 10*3/uL (ref 150–400)
RBC: 2.53 MIL/uL — ABNORMAL LOW (ref 4.22–5.81)
RDW: 20.6 % — ABNORMAL HIGH (ref 11.5–15.5)
WBC: 13.7 10*3/uL — ABNORMAL HIGH (ref 4.0–10.5)
nRBC: 0 % (ref 0.0–0.2)

## 2023-05-06 LAB — GLUCOSE, CAPILLARY
Glucose-Capillary: 87 mg/dL (ref 70–99)
Glucose-Capillary: 87 mg/dL (ref 70–99)

## 2023-05-06 LAB — ANCA PROFILE
Anti-MPO Antibodies: <0.2 units (ref 0.0–0.9)
Anti-PR3 Antibodies: <0.2 units (ref 0.0–0.9)
Atypical P-ANCA titer: <1:20 {titer}
C-ANCA: <1:20 {titer}
P-ANCA: <1:20 {titer}

## 2023-05-06 LAB — BASIC METABOLIC PANEL
Anion gap: 8 (ref 5–15)
BUN: 65 mg/dL — ABNORMAL HIGH (ref 6–20)
CO2: 16 mmol/L — ABNORMAL LOW (ref 22–32)
Calcium: 8.2 mg/dL — ABNORMAL LOW (ref 8.9–10.3)
Chloride: 111 mmol/L (ref 98–111)
Creatinine, Ser: 2.67 mg/dL — ABNORMAL HIGH (ref 0.61–1.24)
GFR, Estimated: 29 mL/min — ABNORMAL LOW (ref >60)
Glucose, Bld: 84 mg/dL (ref 70–99)
Potassium: 3.9 mmol/L (ref 3.5–5.1)
Sodium: 135 mmol/L (ref 135–145)

## 2023-05-06 LAB — MPL MUTATION ANALYSIS

## 2023-05-06 LAB — TYPE AND SCREEN
Unit division: 0
Unit division: 0

## 2023-05-06 LAB — JAK2 GENOTYPR

## 2023-05-06 LAB — ANA W/REFLEX IF POSITIVE: Anti Nuclear Antibody (ANA): NEGATIVE

## 2023-05-06 LAB — GLOMERULAR BASEMENT MEMBRANE ANTIBODIES: GBM Ab: <0.2 units (ref 0.0–0.9)

## 2023-05-06 LAB — PREPARE RBC (CROSSMATCH)

## 2023-05-06 LAB — PHOSPHORUS: Phosphorus: 3.5 mg/dL (ref 2.5–4.6)

## 2023-05-06 MED ORDER — SODIUM CHLORIDE 0.9% IV SOLUTION: Freq: Once | INTRAVENOUS | Status: AC

## 2023-05-06 MED ORDER — METOPROLOL SUCCINATE ER 25 MG PO TB24
12.5000 mg | ORAL_TABLET | Freq: Every day | ORAL | Status: DC
  Administered 2023-05-06 – 2023-05-23 (×18): 12.5 mg via ORAL
  Filled 2023-05-06 (×18): qty 1

## 2023-05-06 MED ORDER — PROSOURCE PLUS PO LIQD
30.0000 mL | Freq: Three times a day (TID) | ORAL | Status: DC
  Administered 2023-05-06 – 2023-05-10 (×2): 30 mL via ORAL
  Filled 2023-05-06: qty 30

## 2023-05-06 MED ORDER — ADULT MULTIVITAMIN W/MINERALS CH
1.0000 | ORAL_TABLET | Freq: Two times a day (BID) | ORAL | Status: DC
  Administered 2023-05-06 – 2023-05-23 (×35): 1 via ORAL
  Filled 2023-05-06 (×35): qty 1

## 2023-05-06 NOTE — Progress Notes (Signed)
Occupational Therapy Treatment Patient Details Name: Christopher Herman MRN: [id number] DOB: 1976-11-03 Today's Date: 05/06/2023   History of present illness 47 y/o male presented to ED on 04/21/23 for weakness, extreme fatigue, unintended weight loss, disuse of UE and poor P.O. intake. Admitted for sepsis 2/2 unknown etiology and severe hypothyroidism. PMH: severe obesity s/p bariatric surgery, OSA, hypoventilation syndrome, HTN, cholelithiasis, bilateral extremity lipoma, chronic lymphedema s/p IVC filter placement, IDA   OT comments  Patient received supine in bed and agreeable to OT. Tx session focused on increasing independence with ADL of self-feeding and BUE ROM/strength for improved functional use. OT provided pt with yellow theraband and HEP handout for BUE. Max A required initially for proper technique and maintaining grip on theraband (see details below). HR 110s-120s with activity. Physical assistance provided for optimal positioning of extremities at end of session. Pt left as received with all needs in reach. Pt is making progress toward goal completion. D/C recommendation remains appropriate. OT will continue to follow acutely.     Recommendations for follow up therapy are one component of a multi-disciplinary discharge planning process, led by the attending physician.  Recommendations may be updated based on patient status, additional functional criteria and insurance authorization.    Assistance Recommended at Discharge Frequent or constant Supervision/Assistance  Patient can return home with the following  Two people to help with bathing/dressing/bathroom;Two people to help with walking and/or transfers;Assistance with cooking/housework;Assist for transportation;Help with stairs or ramp for entrance   Equipment Recommendations  None recommended by OT    Recommendations for Other Services      Precautions / Restrictions Precautions Precautions: Fall Restrictions Weight Bearing  Restrictions: No       Mobility Bed Mobility Overal bed mobility: Needs Assistance             General bed mobility comments: Requires +3 for bed mobility at baseline. OT provided physical assistance for optimal positioning of extremities at end of session.    Transfers     General transfer comment: unable     Balance         ADL either performed or assessed with clinical judgement   ADL Overall ADL's : Needs assistance/impaired Eating/Feeding: Bed level Eating/Feeding Details (indicate cue type and reason): pt reported just finishing breakfast and requiring Max A from NT, pt able to bring B hand to nose/mouth with increased time          Extremity/Trunk Assessment Upper Extremity Assessment Upper Extremity Assessment: Generalized weakness   Lower Extremity Assessment Lower Extremity Assessment: Generalized weakness        Vision Patient Visual Report: No change from baseline     Perception     Praxis      Cognition Arousal/Alertness: Awake/alert Behavior During Therapy: Flat affect, WFL for tasks assessed/performed Overall Cognitive Status: Within Functional Limits for tasks assessed          Exercises Other Exercises Other Exercises: OT provided pt with yellow theraband and HEP handout for BUE. Pt instructed to complete exercises x10 each, 2-3x/day, 5-7 days/week. Pt completed AAROM for diagonal shoulder abduction, shoulder external rotation, and shoulder flexion. AROM for elbow flexion/extension after initial demonstration. Pt had difficulty gripping theraband, OT had to position theraband around B hands. Max A initially for proper technique.    Shoulder Instructions       General Comments      Pertinent Vitals/ Pain       Pain Assessment Pain Assessment: No/denies pain  Home Living  Prior Functioning/Environment              Frequency  Min 1X/week        Progress Toward Goals  OT Goals(current goals can now be  found in the care plan section)  Progress towards OT goals: Progressing toward goals  Acute Rehab OT Goals Patient Stated Goal: to self-feed OT Goal Formulation: With patient Time For Goal Achievement: 05/12/23 Potential to Achieve Goals: Fair  Plan Discharge plan remains appropriate;Frequency remains appropriate    Co-evaluation                 AM-PAC OT "6 Clicks" Daily Activity     Outcome Measure   Help from another person eating meals?: A Lot Help from another person taking care of personal grooming?: Total Help from another person toileting, which includes using toliet, bedpan, or urinal?: Total Help from another person bathing (including washing, rinsing, drying)?: Total Help from another person to put on and taking off regular upper body clothing?: Total Help from another person to put on and taking off regular lower body clothing?: Total 6 Click Score: 7    End of Session    OT Visit Diagnosis: Muscle weakness (generalized) (M62.81)   Activity Tolerance Patient tolerated treatment well   Patient Left in bed;with call bell/phone within reach;Other (comment) (MD present at end of session)   Nurse Communication Mobility status        Time: [phone] OT Time Calculation (min): 20 min  Charges: OT General Charges $OT Visit: 1 Visit OT Treatments $Therapeutic Exercise: 8-22 mins  [facility] MS, OTR/L ascom [phone]  05/06/23, 12:22 PM

## 2023-05-06 NOTE — Plan of Care (Signed)
  Problem: Education: Goal: Knowledge of General Education information will improve Description: Including pain rating scale, medication(s)/side effects and non-pharmacologic comfort measures Outcome: Progressing   Problem: Nutrition: Goal: Adequate nutrition will be maintained Outcome: Progressing   Problem: Fluid Volume: Goal: Ability to maintain a balanced intake and output will improve Outcome: Progressing   Problem: Health Behavior/Discharge Planning: Goal: Ability to manage health-related needs will improve Outcome: Not Progressing   Problem: Activity: Goal: Risk for activity intolerance will decrease Outcome: Not Progressing   Problem: Elimination: Goal: Will not experience complications related to bowel motility Outcome: Not Progressing   Problem: Skin Integrity: Goal: Risk for impaired skin integrity will decrease Outcome: Not Progressing

## 2023-05-06 NOTE — Progress Notes (Signed)
Nutrition Follow-up  DOCUMENTATION CODES:   Non-severe (moderate) malnutrition in context of chronic illness  INTERVENTION:   -D/c Prosource TF -30 ml Prosource Plus TID, each supplement provides 100 kcals and 15 grams protein -D/c liquid MVI -MVI with minerals BID -Snacks TID with meals -Vitamin C 500mg  po BID -Folic acid 1mg  po daily  -Thiamine 100mg  po daily  -Copper 8mg  po daily  -Vitamin A 10,000 units po daily  -Ergocalciferol 50,000 units po weekly -Vitamin E 400 units po daily  -Zinc 220mg  po daily   -Lactaid with meals  -Creon 24,000 units po with meals  NUTRITION DIAGNOSIS:   Moderate Malnutrition related to chronic illness (h/o roux-en-y gastric bypass) as evidenced by moderate fat depletion, moderate muscle depletion, percent weight loss.  Ongoing  GOAL:   Patient will meet greater than or equal to 90% of their needs  Progressing  MONITOR:   PO intake, Supplement acceptance, Labs, Weight trends, Skin, I & O's  REASON FOR ASSESSMENT:   Rounds    ASSESSMENT:   47 y/o male with h/o hypothyroidism, anxiety, morbid obesity s/p roux-en-y (2013), anxiety, HTN, OSA, GERD, IVC filter, H. pylori, lymphedema and cholelithiasis who is admitted with septic shock.  Reviewed I/O's: -530 ml x 24 hours and +5.3 L since 04/22/23  UOP: 650 ml x 24 hours   Xifan to end on 05/13/23.  Spoke with pt at bedside, who was pleasant and in good spirits today. Pt reports feeling better and making goof progress- he is happy that he has been able to transfer out of the ICU. Per pt, he has been trying to eat better and is focusing on protein sources. Per pt, he consumed meatloaf last night for dinner and ate two boiled eggs and a sausage patty for breakfast this morning. Pt very wary about eating carbohydrates, as he believes that this is exacerbating diarrhea and states that he think the applesauce using for medication administration is also contributing to his diarrhea. He shares  that his diarrhea is improving, however, fixates on sugar content of foods. RD reviewed plan of care and discussed importance of good meal, vitamin, and supplement intake to promote healing. Pt likes eating snacks between meals and reports his mother sometime brings him outside foods, such as Slim Jims. Pt expressed appreciation for visit.   No new wt since las visit.   Medications reviewed and include folic acid, lastase, creon, copper, remeron, thiamine, vitamin A, vitamin D, vitamin E, and zinc sulfate.   Labs reviewed: CBGS: 87 (inpatient orders for glycemic control are 0-15 units insulin aspart TID with meals).    Diet Order:   Diet Order             Diet regular Room service appropriate? Yes with Assist; Fluid consistency: Thin  Diet effective now                   EDUCATION NEEDS:   Education needs have been addressed  Skin:  Skin Assessment: Reviewed RN Assessment (Right lateral malleolus:  0.3 cm x 0.2 cm x 0.1 cm, right buttocks ( in skin fold) 1 cm, edema to bilateral lower legs  nonintact lesion to left upper thigh: 3 cm x 5.2 cm)  Last BM:  05/05/23 (type 6)  Height:   Ht Readings from Last 1 Encounters:  04/21/23 5\' 5"  (1.651 m)    Weight:   Wt Readings from Last 1 Encounters:  04/30/23 (!) 169.4 kg    Ideal Body Weight:  61.8  kg  BMI:  Body mass index is 62.15 kg/m.  Estimated Nutritional Needs:   Kcal:  2300-2500  Protein:  140-155 grams  Fluid:  > 2 L    Levada Schilling, RD, LDN, CDCES Registered Dietitian II Certified Diabetes Care and Education Specialist Please refer to [facility] for RD and/or RD on-call/weekend/after hours pager

## 2023-05-06 NOTE — Progress Notes (Addendum)
Progress Note   Patient: Christopher Herman XBM:[id number] DOB: 1976/08/04 DOA: 04/21/2023     15 DOS: the patient was seen and examined on 05/06/2023   Brief hospital course: 47 y.o. male with a PMH significant for severe obesity s/p bariatric surgery, OSA, hypoventilation syndrome, HTN, GERD, cholelithiasis, bilateral extremity lipoma, chronic lymphedema status post IVC filter placement, IDA.   Patient presented from home to the ED on 04/21/2023 with generalized weakness, extreme fatigue, unintended weight loss, disuse of upper extremities and poor p.o. intake  x a few days. He is chronically bed bound and malnourished.    He was initially treated with vancomycin, cefepime, metronidazole.  They were admitted to the ICU for septic shock with hypotension resistant to bolus and required vasopressors.    5/6: Maps improved.  Midodrine stopped 5/6.  Hydrocortisone to complete course today.   5/7: Blood pressure stable off support agents   5/8: No new complaints.  Noted to be tachycardic on the monitor      Assessment and plan  SEPTIC shock SOURCE-UTI Patient has multiple sources of infection including urine and bilateral lower extremity cellulitis.  Blood pressure is now stable off any support agents.  Midodrine and hydrocortisone courses have been completed  . Patient has completed course of antibiotic therapy   Severe Hypothyroidism- Last TSH, Free T4 check was 11 years ago with normal TSH TSH: 9.134 Free T4:1.34 Am cortisol 4/23 WNL -s/p IV Synthroid 250 mcg followed by 100 mcg daily. Repeat TSH on 4/25 is 1.393 Plan: Continue Synthroid 100 mcg daily No indication to resume hydrocortisone Will need repeat TSH in a few weeks   Acute renal failure- Hyponatremia hypokalemia Renal US neg for hydronephrosis or obstruction. Foley removed 4/26.  Urine output remains adequate Appreciate nephrology input.  Currently no indication for inpatient hemodialysis.  Creatinine slowly worsening.   Encourage p.o. intake.  Patient nonoliguric.  Considering ATN from vancomycin exposure.  Daily renal function   Thrombocytosis  Significant leukocytosis  diff showing abs neutrophil predominance.Morphology unremarkable on pathology report. White count tends to be chronically elevated but overall trending down    Anemia of chronic disease- Transfused 1u pRBCs 4/29.   Monitor and replace as necessary.  Will transfuse 1 more unit of packed RBC for hemoglobin of 7.1   Morbid obesity  malnutrition s/p bariatric surgery  chronic diarrhea Patient has had issues with chronic diarrhea.  Discussed with RD at length.  Appears to be eating more.  Frequency of diarrhea has decreased.  Oral intake remains poor but overall improving.  Patient seems motivated.     Sinus tachycardia  prolonged Qtc  501. HR 110s and ECG showing sinus. Asymptomatic - avoid Qtc prolonging agents.  Midodrine discontinued on 5/6.  Heart rate appears to be improving.   Bed-bound - PT/OT evaluation and therapy - frequent turning -Patient will likely need long-term recovery.  May be an appropriate candidate for long-term care versus LTAC placement.  TOC aware   Depression Patient endorsed to both me as well as palliative care feeling depressed.  This is likely impacting his oral intake and unfortunately delaying wound healing.  Initiated Remeron per RD recommendations.  Official consult to psychiatry placed 5/1.  Recommendations appreciated   DVT prophylaxis: SQ heparin      Subjective: Patient is seen and examined at the bedside.  Requesting to be discharged home  Physical Exam: Vitals:   05/06/23 0000 05/06/23 0400 05/06/23 0810 05/06/23 1156  BP: 118/86 102/67 116/84 116/85  Pulse:  99 (!) 112 (!) 113 (!) 120  Resp: 18  17 18   Temp: 97.9 F (36.6 C) 97.7 F (36.5 C) 97.9 F (36.6 C) 98.1 F (36.7 C)  TempSrc: Oral Oral Oral Oral  SpO2: 100% 99% 100% 100%  Weight:      Height:       Physical  Exam Vitals and nursing note reviewed.  Constitutional:      Appearance: He is obese.     Comments: Chronically ill-appearing  HENT:     Head: Normocephalic and atraumatic.     Nose: Nose normal.     Mouth/Throat:     Mouth: Mucous membranes are moist.  Eyes:     Conjunctiva/sclera: Conjunctivae normal.  Cardiovascular:     Rate and Rhythm: Normal rate and regular rhythm.  Pulmonary:     Effort: Pulmonary effort is normal.     Breath sounds: Normal breath sounds.  Abdominal:     General: Bowel sounds are normal.     Palpations: Abdomen is soft.     Comments: Central adiposity  Musculoskeletal:     Cervical back: Normal range of motion.     Comments: Bilateral foot drop  Skin:    General: Skin is warm and dry.  Neurological:     Motor: Weakness present.  Psychiatric:        Mood and Affect: Mood normal.        Behavior: Behavior normal.     Data Reviewed: Relevant notes from primary care and specialist visits, past discharge summaries as available in EHR, including Care Everywhere. Prior diagnostic testing as pertinent to current admission diagnoses Updated medications and problem lists for reconciliation ED course, including vitals, labs, imaging, treatment and response to treatment Triage notes, nursing and pharmacy notes and ED provider's notes Notable results as noted in HPI Labs reviewed.  Hemoglobin 7.1, white count 13.7, platelet count 262, BUN 65, creatinine 2.67, bicarb 16 There are no new results to review at this time.  Family Communication: Attempted to call and give patient's mother, Ms Petiford an update.  Unable to leave voicemail.  Disposition: Status is: Inpatient Remains inpatient appropriate because: Awaiting placement  Planned Discharge Destination: LTAC    Time spent: 35 minutes  Author: Lucile Shutters, MD 05/06/2023 2:16 PM  For on call review [email or web address].

## 2023-05-06 NOTE — Progress Notes (Signed)
Pt given a soft touch call bell. Pt was instructed on how to use call bell d/t the inability to push the button to call the nurse on the regularly placed call bell. Pt demonstrated the ability to press the call bell and was able to do so using his hands. Documented education and teaching with Director.

## 2023-05-07 DIAGNOSIS — E039 Hypothyroidism, unspecified: Secondary | ICD-10-CM | POA: Diagnosis not present

## 2023-05-07 LAB — BPAM RBC
Blood Product Expiration Date: 202405092359
ISSUE DATE / TIME: 202405081853
Unit Type and Rh: 5100

## 2023-05-07 LAB — CBC WITH DIFFERENTIAL/PLATELET
Abs Immature Granulocytes: 0.19 10*3/uL — ABNORMAL HIGH (ref 0.00–0.07)
Basophils Absolute: 0 10*3/uL (ref 0.0–0.1)
Basophils Relative: 0 %
Eosinophils Absolute: 0.2 10*3/uL (ref 0.0–0.5)
Eosinophils Relative: 2 %
HCT: 25.3 % — ABNORMAL LOW (ref 39.0–52.0)
Hemoglobin: 8.3 g/dL — ABNORMAL LOW (ref 13.0–17.0)
Immature Granulocytes: 2 %
Lymphocytes Relative: 10 %
Lymphs Abs: 1.3 10*3/uL (ref 0.7–4.0)
MCH: 28.3 pg (ref 26.0–34.0)
MCHC: 32.8 g/dL (ref 30.0–36.0)
MCV: 86.3 fL (ref 80.0–100.0)
Monocytes Absolute: 0.6 10*3/uL (ref 0.1–1.0)
Monocytes Relative: 5 %
Neutro Abs: 10.1 10*3/uL — ABNORMAL HIGH (ref 1.7–7.7)
Neutrophils Relative %: 81 %
Platelets: 237 10*3/uL (ref 150–400)
RBC: 2.93 MIL/uL — ABNORMAL LOW (ref 4.22–5.81)
RDW: 19.8 % — ABNORMAL HIGH (ref 11.5–15.5)
WBC: 12.4 10*3/uL — ABNORMAL HIGH (ref 4.0–10.5)
nRBC: 0 % (ref 0.0–0.2)

## 2023-05-07 LAB — BASIC METABOLIC PANEL
Anion gap: 11 (ref 5–15)
BUN: 65 mg/dL — ABNORMAL HIGH (ref 6–20)
CO2: 14 mmol/L — ABNORMAL LOW (ref 22–32)
Calcium: 8.4 mg/dL — ABNORMAL LOW (ref 8.9–10.3)
Chloride: 109 mmol/L (ref 98–111)
Creatinine, Ser: 2.74 mg/dL — ABNORMAL HIGH (ref 0.61–1.24)
GFR, Estimated: 28 mL/min — ABNORMAL LOW (ref >60)
Glucose, Bld: 89 mg/dL (ref 70–99)
Potassium: 3.6 mmol/L (ref 3.5–5.1)
Sodium: 134 mmol/L — ABNORMAL LOW (ref 135–145)

## 2023-05-07 LAB — GLUCOSE, CAPILLARY
Glucose-Capillary: 91 mg/dL (ref 70–99)
Glucose-Capillary: 95 mg/dL (ref 70–99)
Glucose-Capillary: 94 mg/dL (ref 70–99)

## 2023-05-07 LAB — CALRETICULIN (CALR) MUTATION ANALYSIS

## 2023-05-07 LAB — TYPE AND SCREEN
ABO/RH(D): O POS
Antibody Screen: NEGATIVE

## 2023-05-07 LAB — PROTEIN / CREATININE RATIO, URINE
Creatinine, Urine: 67 mg/dL
Protein Creatinine Ratio: 0.73 mg/mg{Cre} — ABNORMAL HIGH (ref 0.00–0.15)
Total Protein, Urine: 49 mg/dL

## 2023-05-07 LAB — C4 COMPLEMENT: Complement C4, Body Fluid: 30 mg/dL (ref 12–38)

## 2023-05-07 LAB — PHOSPHORUS: Phosphorus: 3.9 mg/dL (ref 2.5–4.6)

## 2023-05-07 LAB — C3 COMPLEMENT: C3 Complement: 64 mg/dL — ABNORMAL LOW (ref 82–167)

## 2023-05-07 NOTE — Progress Notes (Signed)
Progress Note   Patient: Christopher Herman GEX:[id number] DOB: 1976-08-25 DOA: 04/21/2023     16 DOS: the patient was seen and examined on 05/07/2023   Brief hospital course: 47 y.o. male with a PMH significant for severe obesity s/p bariatric surgery, OSA, hypoventilation syndrome, HTN, GERD, cholelithiasis, bilateral extremity lipoma, chronic lymphedema status post IVC filter placement, IDA.   Patient presented from home to the ED on 04/21/2023 with generalized weakness, extreme fatigue, unintended weight loss, disuse of upper extremities and poor p.o. intake  x a few days. He is chronically bed bound and malnourished.    He was initially treated with vancomycin, cefepime, metronidazole.  They were admitted to the ICU for septic shock with hypotension resistant to bolus and required vasopressors.    5/6: Maps improved.  Midodrine stopped 5/6.  Hydrocortisone to complete course today.   5/7: Blood pressure stable off support agents     5/8: No new complaints.  Noted to be tachycardic on the monitor    5/9 : Noted to have increased weeping from left lower extremity wound.    Assessment and Plan:  SEPTIC shock SOURCE-UTI Patient has multiple sources of infection including urine and bilateral lower extremity cellulitis.  Blood pressure is now stable off any support agents.  Midodrine and hydrocortisone courses have been completed  . Patient has completed course of antibiotic therapy   Severe Hypothyroidism- Last TSH, Free T4 check was 11 years ago with normal TSH TSH: 9.134 Free T4:1.34 Am cortisol 4/23 WNL -s/p IV Synthroid 250 mcg followed by 100 mcg daily. Repeat TSH on 4/25 is 1.393 Plan: Continue Synthroid 100 mcg daily No indication to resume hydrocortisone Will need repeat TSH in a few weeks   Acute renal failure- Hyponatremia hypokalemia Renal US neg for hydronephrosis or obstruction. Foley removed 4/26.  Urine output remains adequate Appreciate nephrology input.   Currently no indication for inpatient hemodialysis.  Creatinine slowly worsening.  Encourage p.o. intake.  Patient nonoliguric.  Considering ATN from vancomycin exposure.  Daily renal function   Thrombocytosis  Significant leukocytosis  diff showing abs neutrophil predominance.Morphology unremarkable on pathology report. White count tends to be chronically elevated but overall trending down     Anemia of chronic disease- Transfused a total of 2 units of packed RBC during this hospitalization. 4/29 and 5/8 Monitor and replace as necessary.     Morbid obesity  malnutrition s/p bariatric surgery  chronic diarrhea Patient has had issues with chronic diarrhea.  Discussed with RD at length.  Appears to be eating more.  Frequency of diarrhea has decreased.  Oral intake remains poor but overall improving.  Patient seems motivated.     Sinus tachycardia  prolonged Qtc  501. HR 110s and ECG showing sinus. Asymptomatic - avoid Qtc prolonging agents.  Midodrine discontinued on 5/6.  Heart rate appears to be improving.   Bed-bound - PT/OT evaluation and therapy - frequent turning -Patient will likely need long-term recovery.  May be an appropriate candidate for long-term care versus LTAC placement.  TOC aware   Depression Patient endorsed to both me as well as palliative care feeling depressed.  This is likely impacting his oral intake and unfortunately delaying wound healing.  Initiated Remeron per RD recommendations.  Official consult to psychiatry placed 5/1.  Recommendations appreciated.  Continue Remeron        Subjective: Patient is seen and examined at the bedside.  Has no new complaints.  Physical Exam: Vitals:   05/07/23 0742 05/07/23 0800  05/07/23 1116 05/07/23 1122  BP: 109/84   100/83  Pulse: (!) 124 (!) 112  (!) 119  Resp: 17   18  Temp: 98.3 F (36.8 C)   98.1 F (36.7 C)  TempSrc: Axillary   Oral  SpO2: 100%   100%  Weight:   (!) 142.7 kg   Height:       Physical  Exam Vitals and nursing note reviewed.  Constitutional:      Appearance: He is obese.     Comments: Chronically ill-appearing  HENT:     Head: Normocephalic and atraumatic.     Nose: Nose normal.     Mouth/Throat:     Mouth: Mucous membranes are moist.  Eyes:     Conjunctiva/sclera: Conjunctivae normal.  Cardiovascular:     Rate and Rhythm: Normal rate and regular rhythm.  Pulmonary:     Effort: Pulmonary effort is normal.     Breath sounds: Normal breath sounds.  Abdominal:     General: Bowel sounds are normal.     Palpations: Abdomen is soft.     Comments: Central adiposity  Musculoskeletal:     Cervical back: Normal range of motion.     Comments: Bilateral foot drop  Skin:    General: Skin is warm and dry.  Neurological:     Motor: Weakness present.  Psychiatric:        Mood and Affect: Mood normal.        Behavior: Behavior normal.      Data Reviewed: Relevant notes from primary care and specialist visits, past discharge summaries as available in EHR, including Care Everywhere. Prior diagnostic testing as pertinent to current admission diagnoses Updated medications and problem lists for reconciliation ED course, including vitals, labs, imaging, treatment and response to treatment Triage notes, nursing and pharmacy notes and ED provider's notes Notable results as noted in HPI Labs reviewed.  Baseline chronic kidney disease.  Improved hemoglobin following transfusion of 1 unit of packed RBC There are no new results to review at this time.  Family Communication: Called and spoke to patient's mother, Christopher Herman.  Gave her an update on patient's current condition.  Plan of care was discussed in detail.  She verbalizes understanding and agrees with the plan.  Disposition: Status is: Inpatient Remains inpatient appropriate because: Continues to require wound care  Planned Discharge Destination: LTAC    Time spent: 33 minutes  Author: Lucile Shutters,  MD 05/07/2023 2:12 PM  For on call review [email or web address].

## 2023-05-07 NOTE — Progress Notes (Signed)
Occupational Therapy Treatment Patient Details Name: Christopher Herman MRN: [id number] DOB: [birth year] Today's Date: 05/07/2023   History of present illness 47 y/o male presented to ED on 04/21/23 for weakness, extreme fatigue, unintended weight loss, disuse of UE and poor P.O. intake. Admitted for sepsis 2/2 unknown etiology and severe hypothyroidism. PMH: severe obesity s/p bariatric surgery, OSA, hypoventilation syndrome, HTN, cholelithiasis, bilateral extremity lipoma, chronic lymphedema s/p IVC filter placement, IDA   OT comments  Pt received semi-reclined in bed. Appearing alert; willing to work with OT on UE exercises and grooming. See flowsheet below for further details of session. Left semi-reclined in bed, mother in room at end of session, NT in room, with all needs in reach.  Patient will benefit from continued OT while in acute care.    Recommendations for follow up therapy are one component of a multi-disciplinary discharge planning process, led by the attending physician.  Recommendations may be updated based on patient status, additional functional criteria and insurance authorization.    Assistance Recommended at Discharge Frequent or constant Supervision/Assistance  Patient can return home with the following  Two people to help with bathing/dressing/bathroom;Two people to help with walking and/or transfers;Assistance with cooking/housework;Assist for transportation;Help with stairs or ramp for entrance   Equipment Recommendations  None recommended by OT    Recommendations for Other Services      Precautions / Restrictions Precautions Precautions: Fall Restrictions Weight Bearing Restrictions: No       Mobility Bed Mobility                    Transfers                         Balance                                           ADL either performed or assessed with clinical judgement   ADL       Grooming: Oral care;Set  up;Supervision/safety;Bed level;Minimal assistance (HOB raised) Grooming Details (indicate cue type and reason): With OT holding toothbrush, pt took approx 20 seconds to squeeze small toothpaste tube between thumb and indx finger on R hand to dispense a pea sized amount of toothpaste; OT had to rotate the toothbrush to finish the activity as pt not able to further pronate R hand to allow toothpaste to stick to toothbrush. Pt used R hand to brush teeth for approx 1 minute; OT placed spit basin under pt's chin for spitting; pt rinsed mouth with OT providing MIN support to keep cup of water from spilling out of pt's R hand due to RUE weakness. Pt rinsed and spit twice. Pt able to use R hand to wipe water from beard with washcloth.                                    Extremity/Trunk Assessment Upper Extremity Assessment Upper Extremity Assessment: RUE deficits/detail;LUE deficits/detail RUE Deficits / Details: Pt able to make hand grip in R hand; sufficient for holding/using toothbrush with intermittent dropping; able to flex/extend elbow from abdomen to face. Trace movement of R shoulder at bed level. LUE Deficits / Details: Pt not able to make a full grip with L hand, but able to flex fingers for C position. Pt able  to flex/extend L elbow with great effort, able to touch abdomen and face, with increased effort; LUE noted to be weaker than RUE. Trace AROM at  L shoulder.   Lower Extremity Assessment Lower Extremity Assessment: Defer to PT evaluation        Vision       Perception     Praxis      Cognition Arousal/Alertness: Awake/alert Behavior During Therapy: WFL for tasks assessed/performed Overall Cognitive Status: Within Functional Limits for tasks assessed                                 General Comments: Pt motivated and appreciative of OT session today.        Exercises Exercises: Other exercises Other Exercises Other Exercises: OT knotted a loop  into one end of yellow theraband for pt to wrap around hand due to decreased grip strength. Pt participated in theraband exercises 10 reps for elbow flexion and horizontal abduction. Mother arrived halfway through session and OT guided pt and mother in demonstration of exercises (with mother holding other end of theraband as she will need to at d/c). Pt stated that the handout provided yesterday has already been taken home. Other Exercises: OT provided pt with 3 inch piece of foam pool noodle for hand strengthening; pt able to hold foam in L hand, but with minimal ability to squeeze; able to hold in R hand and able to grip slightly. OT instructed pt to use intermittently throughout the day for building grip strength BIL hands. Other Exercises: OT encouraged pt to engage in enjoyed crafts (knitting, crocheting) or new crafts (painting, coloring) to engage BIL UE in meaningful activities and promote further strengthening; encouraged pt to take rest breaks as needed throughout activities. Pt verbalized understanding.    Shoulder Instructions       General Comments      Pertinent Vitals/ Pain       Pain Assessment Pain Assessment: No/denies pain  Home Living                                          Prior Functioning/Environment              Frequency  Min 1X/week        Progress Toward Goals  OT Goals(current goals can now be found in the care plan section)  Progress towards OT goals: Progressing toward goals  Acute Rehab OT Goals Patient Stated Goal: Get better OT Goal Formulation: With patient Time For Goal Achievement: 05/12/23 Potential to Achieve Goals: Fair ADL Goals Pt Will Perform Eating: with set-up;with adaptive utensils;bed level  Plan Discharge plan remains appropriate;Frequency remains appropriate    Co-evaluation                 AM-PAC OT "6 Clicks" Daily Activity     Outcome Measure   Help from another person eating meals?: A  Lot Help from another person taking care of personal grooming?: Total Help from another person toileting, which includes using toliet, bedpan, or urinal?: Total Help from another person bathing (including washing, rinsing, drying)?: Total Help from another person to put on and taking off regular upper body clothing?: Total Help from another person to put on and taking off regular lower body clothing?: Total 6 Click Score: 7    End  of Session Equipment Utilized During Treatment: Other (comment) (yellow theraband; foam piece of pool noodle)  OT Visit Diagnosis: Muscle weakness (generalized) (M62.81)   Activity Tolerance Patient tolerated treatment well   Patient Left in bed;with call bell/phone within reach;with nursing/sitter in room;with family/visitor present (NT in room, mother in room, pancake call bell on pt's abdomen (within reach))   Nurse Communication Other (comment) (NT aware of pt status, as NT is in the room.)        Time: 0454-0981 OT Time Calculation (min): 34 min  Charges: OT General Charges $OT Visit: 1 Visit OT Treatments $Self Care/Home Management : 8-22 mins $Therapeutic Activity: 8-22 mins  Linward Foster, MS, OTR/L  Alvester Morin 05/07/2023, 3:51 PM

## 2023-05-07 NOTE — Consult Note (Signed)
WOC Nurse Consult Note: Reason for Consult: excessive weeping Patient has been seen this admission for buttock and LE wound. History of lymphedema and morbid obesity. With irritant contact dermatitis in his skin folds  Wound type:see above  Pressure Injury POA: Yes Measurement: see nursing notes   Dressing procedure/placement/frequency: I have provided nursing with an alternative dressing that may absorb a bit more drainage.  Provided [place] #s as well.     Re consult if needed, will not follow at this time. Thanks  Deziya Amero [employer], RN,CWOCN, CNS, CWON-AP [phone])

## 2023-05-07 NOTE — Progress Notes (Signed)
[facility], [place] 05/07/23  Subjective:   Hospital day # 16  Alert and oriented No complaints to offer Appetite continues to fluctuate   Renal: 05/08 0701 - 05/09 0700 In: 834 [P.O.:480; Blood:354] Out: 550 [Urine:550] Lab Results  Component Value Date   CREATININE 2.74 (H) 05/07/2023   CREATININE 2.67 (H) 05/06/2023   CREATININE 2.64 (H) 05/05/2023     Objective:  Vital signs in last 24 hours:  Temp:  [97.6 F (36.4 C)-98.3 F (36.8 C)] 97.8 F (36.6 C) (05/09 1520) Pulse Rate:  [98-124] 105 (05/09 1520) Resp:  [14-18] 16 (05/09 1520) BP: (100-122)/(75-88) 115/88 (05/09 1520) SpO2:  [100 %] 100 % (05/09 1520) Weight:  [142.7 kg] 142.7 kg (05/09 1116)  Weight change:  Filed Weights   04/29/23 0500 04/30/23 0500 05/07/23 1116  Weight: (!) 169 kg (!) 169.4 kg (!) 142.7 kg    Intake/Output:    Intake/Output Summary (Last 24 hours) at 05/07/2023 1642 Last data filed at 05/07/2023 1436 Gross per 24 hour  Intake 1074 ml  Output 950 ml  Net 124 ml      Physical Exam: General: morbidly obese gentleman, laying in the bed  HEENT Moist oral mucous membranes  Pulm/lungs Normal breathing effort room air  CVS/Heart No rub  Abdomen:  Obese, nontender  Extremities: Significant dependent peripheral edema on the thighs  Neurologic: Alert, oriented  Skin: Lichenified skin over calves and lower thighs  Dialysis Access: None at present       Basic Metabolic Panel:  Recent Labs  Lab 05/03/23 0500 05/04/23 0500 05/05/23 0550 05/06/23 0535 05/07/23 0500  NA 134* 133* 134* 135 134*  K 2.9* 3.2* 2.7* 3.9 3.6  CL 110 107 107 111 109  CO2 18* 16* 17* 16* 14*  GLUCOSE 106* 107* 95 84 89  BUN 55* 59* 63* 65* 65*  CREATININE 2.16* 2.42* 2.64* 2.67* 2.74*  CALCIUM 7.7* 8.2* 8.2* 8.2* 8.4*  MG 2.0  --   --   --   --   PHOS 2.7 3.0 3.0 3.5 3.9      CBC: Recent Labs  Lab 05/03/23 0500 05/04/23 0500 05/05/23 0550 05/06/23 0535  05/07/23 0500  WBC 17.5* 15.6* 16.1* 13.7* 12.4*  NEUTROABS 15.1* 13.3* 13.3* 11.4* 10.1*  HGB 6.7* 7.5* 7.6* 7.1* 8.3*  HCT 20.1* 22.7* 22.9* 21.8* 25.3*  MCV 83.8 85.0 84.5 86.2 86.3  PLT 285 291 287 262 237      No results found for: "HEPBSAG", "HEPBSAB", "HEPBIGM"    Microbiology:  Recent Results (from the past 240 hour(s))  Culture, blood (Routine X 2) w Reflex to ID Panel     Status: None   Collection Time: 04/28/23  7:05 AM   Specimen: BLOOD  Result Value Ref Range Status   Specimen Description BLOOD  RIGHT ARM  Final   Special Requests   Final    BOTTLES DRAWN AEROBIC AND ANAEROBIC Blood Culture adequate volume   Culture   Final    NO GROWTH 5 DAYS Performed at [facility], [address] [place], [place] [address]    Report Status 05/03/2023 FINAL  Final  Culture, blood (Routine X 2) w Reflex to ID Panel     Status: None   Collection Time: 04/28/23  7:05 AM   Specimen: BLOOD  Result Value Ref Range Status   Specimen Description BLOOD  LEFT [facility]  Final   Special Requests   Final    BOTTLES DRAWN AEROBIC  AND ANAEROBIC Blood Culture results may not be optimal due to an excessive volume of blood received in culture bottles   Culture   Final    NO GROWTH 5 DAYS Performed at [facility], [address] [place], [place] [address]    Report Status 05/05/2023 FINAL  Final  Gastrointestinal Panel by PCR , Stool     Status: None   Collection Time: 04/30/23  5:50 AM   Specimen: Stool  Result Value Ref Range Status   Campylobacter species NOT DETECTED NOT DETECTED Final   Plesimonas shigelloides NOT DETECTED NOT DETECTED Final   Salmonella species NOT DETECTED NOT DETECTED Final   Yersinia enterocolitica NOT DETECTED NOT DETECTED Final   Vibrio species NOT DETECTED NOT DETECTED Final   Vibrio cholerae NOT DETECTED NOT DETECTED Final   Enteroaggregative E coli (EAEC) NOT DETECTED NOT DETECTED Final   Enteropathogenic E coli (EPEC) NOT DETECTED NOT  DETECTED Final   Enterotoxigenic E coli (ETEC) NOT DETECTED NOT DETECTED Final   Shiga like toxin producing E coli (STEC) NOT DETECTED NOT DETECTED Final   Shigella/Enteroinvasive E coli (EIEC) NOT DETECTED NOT DETECTED Final   Cryptosporidium NOT DETECTED NOT DETECTED Final   Cyclospora cayetanensis NOT DETECTED NOT DETECTED Final   Entamoeba histolytica NOT DETECTED NOT DETECTED Final   Giardia lamblia NOT DETECTED NOT DETECTED Final   Adenovirus F40/41 NOT DETECTED NOT DETECTED Final   Astrovirus NOT DETECTED NOT DETECTED Final   Norovirus GI/GII NOT DETECTED NOT DETECTED Final   Rotavirus A NOT DETECTED NOT DETECTED Final   Sapovirus (I, II, IV, and V) NOT DETECTED NOT DETECTED Final    Comment: Performed at [facility], [address] [place], [place] [address]    Coagulation Studies: No results for input(s): "LABPROT", "INR" in the last 72 hours.  Urinalysis: No results for input(s): "COLORURINE", "LABSPEC", "PHURINE", "GLUCOSEU", "HGBUR", "BILIRUBINUR", "KETONESUR", "PROTEINUR", "UROBILINOGEN", "NITRITE", "LEUKOCYTESUR" in the last 72 hours.  Invalid input(s): "APPERANCEUR"    Imaging: US Venous Img Lower Bilateral (DVT)  Result Date: 05/06/2023 CLINICAL DATA:  Bilateral lower extremity edema. EXAM: BILATERAL LOWER EXTREMITY VENOUS DOPPLER ULTRASOUND TECHNIQUE: Gray-scale sonography with graded compression, as well as color Doppler and duplex ultrasound were performed to evaluate the lower extremity deep venous systems from the level of the common femoral vein and including the common femoral, femoral, profunda femoral, popliteal and calf veins including the posterior tibial, peroneal and gastrocnemius veins when visible. The superficial great saphenous vein was also interrogated. Spectral Doppler was utilized to evaluate flow at rest and with distal augmentation maneuvers in the common femoral, femoral and popliteal veins. COMPARISON:  None Available. FINDINGS: RIGHT  LOWER EXTREMITY Common Femoral Vein: No evidence of thrombus. Normal compressibility, respiratory phasicity and response to augmentation. Saphenofemoral Junction: No evidence of thrombus. Normal compressibility and flow on color Doppler imaging. Profunda Femoral Vein: No evidence of thrombus. Normal compressibility and flow on color Doppler imaging. Femoral Vein: No evidence of thrombus. Normal compressibility, respiratory phasicity and response to augmentation. Popliteal Vein: No evidence of thrombus. Normal compressibility, respiratory phasicity and response to augmentation. Calf Veins: No evidence of thrombus. Normal compressibility and flow on color Doppler imaging. Superficial Great Saphenous Vein: No evidence of thrombus. Normal compressibility. Venous Reflux:  None. Other Findings: No evidence of superficial thrombophlebitis or abnormal fluid collection. LEFT LOWER EXTREMITY Common Femoral Vein: Occlusive thrombus identified. Saphenofemoral Junction: Thrombus is present at the level of the saphenofemoral junction. Profunda Femoral Vein: Thrombus present which appears nonocclusive  with some flow demonstrated. Femoral Vein: Occlusive thrombus identified in the femoral vein. Popliteal Vein: Occlusive thrombus identified in the popliteal vein. Calf Veins: Posterior tibial vein appears to demonstrate some thrombus but is poorly visualized. The peroneal vein was not visualized. Superficial Great Saphenous Vein: No evidence of thrombus. Normal compressibility. Venous Reflux:  None. Other Findings: No evidence of superficial thrombophlebitis or abnormal fluid collection. IMPRESSION: 1. No evidence of right lower extremity deep venous thrombosis. 2. Extensive occlusive and nonocclusive thrombus in the left lower extremity from the common femoral vein through the popliteal vein and likely involving the posterior tibial vein. Electronically Signed   By: Irish Lack M.D.   On: 05/06/2023 17:02     Medications:       (feeding supplement) PROSource Plus  30 mL Oral TID BM   vitamin C  500 mg Oral BID   Chlorhexidine Gluconate Cloth  6 each Topical Daily   copper  8 mg Oral Daily   feeding supplement (KATE FARMS STANDARD 1.4)  325 mL Oral TID BM   folic acid  1 mg Oral Daily   heparin injection (subcutaneous)  5,000 Units Subcutaneous Q8H   insulin aspart  0-15 Units Subcutaneous TID WC   lactase  6,000 Units Oral TID WC   levothyroxine  100 mcg Oral Q0600   lipase/protease/amylase  24,000 Units Oral TID AC   metoprolol succinate  12.5 mg Oral Daily   mirtazapine  15 mg Oral QHS   multivitamin with minerals  1 tablet Oral BID   pantoprazole  40 mg Oral QHS   rifaximin  550 mg Oral TID   thiamine  100 mg Oral Daily   vitamin A  10,000 Units Oral Daily   Vitamin D (Ergocalciferol)  50,000 Units Oral Q7 days   Vitamin E  400 Units Oral Daily   zinc sulfate  220 mg Oral Daily   acetaminophen, bismuth subsalicylate, loperamide, polyethylene glycol  Assessment/ Plan:  47 y.o. male with super morbid obesity, hypertension, obstructive sleep apnea, lymphedema, history of bariatric surgery, cholelithiasis, severe hypothyroidism, bedridden status, history of IVC filter placement admitted on 04/21/2023 for Severe hypothyroidism [E03.9] Generalized weakness [R53.1] Lower limb ulcer, ankle, right, with unspecified severity (HCC) [L97.319] Hypothyroidism, unspecified type [E03.9] Sepsis, due to unspecified organism, unspecified whether acute organ dysfunction present (HCC) [A41.9]   Acute kidney injury Likely secondary to hemodynamic alterations caused by septic shock and hypotension and possibly Vancomycin toxicity leading to ATN. May have a component of infectious   GN workup negative.  Still awaiting urine studies.  Creatinine appears to have stabilized.  May represent new baseline for this patient.  Lab Results  Component Value Date   CREATININE 2.74 (H) 05/07/2023   BUN 65 (H) 05/07/2023   NA  134 (L) 05/07/2023   K 3.6 05/07/2023   CL 109 05/07/2023   CO2 14 (L) 05/07/2023     Generalized edema Patient has hypoalbuminemia and malnutrition.  Patient received IV albumin for several days. Patient encouraged to continue consuming protein supplements.  Hyponatremia Likely secondary to AKI and generalized volume overload. Sodium 134  Hypotension, received pressor support in ICU. Midodrine stopped.  Blood pressure 1/84.  Chronic systolic CHF LVEF 35 to 40%, moderately decreased function, global hypokinesis, grade 1 diastolic dysfunction, normal right ventricular systolic function from echo 04/22/2023.  Acute metabolic acidosis, serum bicarb 16, May consider oral supplementation.  Hypokalemia, resolved  Will continue to follow patient peripherally.   LOS: 16 Tiffiany Beadles 5/9/20244:42 PM  [address] [place], [place]

## 2023-05-08 DIAGNOSIS — E039 Hypothyroidism, unspecified: Secondary | ICD-10-CM | POA: Diagnosis not present

## 2023-05-08 LAB — BASIC METABOLIC PANEL
Anion gap: 9 (ref 5–15)
BUN: 63 mg/dL — ABNORMAL HIGH (ref 6–20)
CO2: 15 mmol/L — ABNORMAL LOW (ref 22–32)
Calcium: 8.1 mg/dL — ABNORMAL LOW (ref 8.9–10.3)
Chloride: 110 mmol/L (ref 98–111)
Creatinine, Ser: 3.13 mg/dL — ABNORMAL HIGH (ref 0.61–1.24)
GFR, Estimated: 24 mL/min — ABNORMAL LOW (ref >60)
Glucose, Bld: 96 mg/dL (ref 70–99)
Potassium: 3.4 mmol/L — ABNORMAL LOW (ref 3.5–5.1)
Sodium: 134 mmol/L — ABNORMAL LOW (ref 135–145)

## 2023-05-08 LAB — CBC WITH DIFFERENTIAL/PLATELET
Abs Immature Granulocytes: 0.12 10*3/uL — ABNORMAL HIGH (ref 0.00–0.07)
Basophils Absolute: 0 10*3/uL (ref 0.0–0.1)
Basophils Relative: 0 %
Eosinophils Absolute: 0.2 10*3/uL (ref 0.0–0.5)
Eosinophils Relative: 2 %
HCT: 23.8 % — ABNORMAL LOW (ref 39.0–52.0)
Hemoglobin: 7.7 g/dL — ABNORMAL LOW (ref 13.0–17.0)
Immature Granulocytes: 1 %
Lymphocytes Relative: 8 %
Lymphs Abs: 0.9 10*3/uL (ref 0.7–4.0)
MCH: 27.8 pg (ref 26.0–34.0)
MCHC: 32.4 g/dL (ref 30.0–36.0)
MCV: 85.9 fL (ref 80.0–100.0)
Monocytes Absolute: 0.6 10*3/uL (ref 0.1–1.0)
Monocytes Relative: 5 %
Neutro Abs: 9.6 10*3/uL — ABNORMAL HIGH (ref 1.7–7.7)
Neutrophils Relative %: 84 %
Platelets: 239 10*3/uL (ref 150–400)
RBC: 2.77 MIL/uL — ABNORMAL LOW (ref 4.22–5.81)
RDW: 19.9 % — ABNORMAL HIGH (ref 11.5–15.5)
WBC: 11.3 10*3/uL — ABNORMAL HIGH (ref 4.0–10.5)
nRBC: 0 % (ref 0.0–0.2)

## 2023-05-08 LAB — PROTEIN ELECTROPHORESIS, SERUM
A/G Ratio: 1.3 (ref 0.7–1.7)
Albumin ELP: 2.7 g/dL — ABNORMAL LOW (ref 2.9–4.4)
Alpha-1-Globulin: 0.2 g/dL (ref 0.0–0.4)
Alpha-2-Globulin: 0.5 g/dL (ref 0.4–1.0)
Beta Globulin: 0.8 g/dL (ref 0.7–1.3)
Gamma Globulin: 0.7 g/dL (ref 0.4–1.8)
Globulin, Total: 2.1 g/dL — ABNORMAL LOW (ref 2.2–3.9)
Total Protein ELP: 4.8 g/dL — ABNORMAL LOW (ref 6.0–8.5)

## 2023-05-08 LAB — GLUCOSE, CAPILLARY
Glucose-Capillary: 91 mg/dL (ref 70–99)
Glucose-Capillary: 97 mg/dL (ref 70–99)
Glucose-Capillary: 96 mg/dL (ref 70–99)

## 2023-05-08 LAB — PHOSPHORUS: Phosphorus: 4.1 mg/dL (ref 2.5–4.6)

## 2023-05-08 MED ORDER — ALBUMIN HUMAN 25 % IV SOLN
25.0000 g | Freq: Two times a day (BID) | INTRAVENOUS | Status: AC
  Administered 2023-05-08 – 2023-05-10 (×5): 25 g via INTRAVENOUS
  Filled 2023-05-08 (×5): qty 100

## 2023-05-08 MED ORDER — ALBUMIN HUMAN 25 % IV SOLN
25.0000 g | Freq: Two times a day (BID) | INTRAVENOUS | Status: DC
  Administered 2023-05-08: 25 g via INTRAVENOUS
  Filled 2023-05-08: qty 100

## 2023-05-08 MED ORDER — POTASSIUM CHLORIDE CRYS ER 20 MEQ PO TBCR
40.0000 meq | EXTENDED_RELEASE_TABLET | Freq: Once | ORAL | Status: AC
  Administered 2023-05-08: 40 meq via ORAL
  Filled 2023-05-08: qty 2

## 2023-05-08 MED ORDER — STERILE WATER FOR INJECTION IV SOLN
INTRAVENOUS | Status: DC
  Filled 2023-05-08: qty 150
  Filled 2023-05-08: qty 1000
  Filled 2023-05-08: qty 150
  Filled 2023-05-08 (×5): qty 1000
  Filled 2023-05-08 (×2): qty 150
  Filled 2023-05-08: qty 1000
  Filled 2023-05-08 (×2): qty 150

## 2023-05-08 NOTE — Progress Notes (Signed)
Unable to find suitable veins for PIV, both arms assessed w/ ultrasound. Pt. Is currently receiving IVF w/ K. RN made aware and to notify NP/MD. RN will notify this IV  Nurse if R IJ still needs to be discontinued.

## 2023-05-08 NOTE — Progress Notes (Signed)
       CROSS COVER NOTE  NAME: Christopher Herman MRN: [id number] DOB : 06/27/1976    HPI/Events of Note   Report:triple lumen access  On review of chart:placed 04/21/23, no longer receiving vasoactive meds or tpn. Peripheral access available    Assessment and  Interventions   Assessment:  Plan: Discontinue central line - iv team consulted       Donnie Mesa NP [facility] Cross Cover 7pm-7am - check amion for availability Pager [phone]

## 2023-05-08 NOTE — Progress Notes (Addendum)
[facility] [place], [place] 05/08/23  Subjective:   Hospital day # 17  Patient resting in bed Appetites remains appropriate.   Creatinine increased. Denies nausea and vomiting.  Renal: 05/09 0701 - 05/10 0700 In: 240 [P.O.:240] Out: 400 [Urine:400] Lab Results  Component Value Date   CREATININE 3.13 (H) 05/08/2023   CREATININE 2.74 (H) 05/07/2023   CREATININE 2.67 (H) 05/06/2023     Objective:  Vital signs in last 24 hours:  Temp:  [97.8 F (36.6 C)-98.8 F (37.1 C)] 98.8 F (37.1 C) (05/10 1133) Pulse Rate:  [64-120] 109 (05/10 1133) Resp:  [16-20] 20 (05/10 1133) BP: (101-118)/(68-88) 118/82 (05/10 1133) SpO2:  [97 %-100 %] 100 % (05/10 1133) Weight:  [145.2 kg-145.6 kg] 145.2 kg (05/10 0825)  Weight change:  Filed Weights   05/07/23 1116 05/08/23 0702 05/08/23 0825  Weight: (!) 142.7 kg (!) 145.6 kg (!) 145.2 kg    Intake/Output:    Intake/Output Summary (Last 24 hours) at 05/08/2023 1318 Last data filed at 05/08/2023 1035 Gross per 24 hour  Intake 240 ml  Output --  Net 240 ml      Physical Exam: General: morbidly obese gentleman, laying in the bed  HEENT Moist oral mucous membranes  Pulm/lungs Normal breathing effort room air  CVS/Heart No rub  Abdomen:  Obese, nontender  Extremities: Significant dependent peripheral edema on the thighs  Neurologic: Alert, oriented  Skin: Lichenified skin over calves and lower thighs  Dialysis Access: None at present       Basic Metabolic Panel:  Recent Labs  Lab 05/03/23 0500 05/04/23 0500 05/05/23 0550 05/06/23 0535 05/07/23 0500 05/08/23 0800  NA 134* 133* 134* 135 134* 134*  K 2.9* 3.2* 2.7* 3.9 3.6 3.4*  CL 110 107 107 111 109 110  CO2 18* 16* 17* 16* 14* 15*  GLUCOSE 106* 107* 95 84 89 96  BUN 55* 59* 63* 65* 65* 63*  CREATININE 2.16* 2.42* 2.64* 2.67* 2.74* 3.13*  CALCIUM 7.7* 8.2* 8.2* 8.2* 8.4* 8.1*  MG 2.0  --   --   --   --   --   PHOS 2.7 3.0 3.0 3.5 3.9 4.1       CBC: Recent Labs  Lab 05/04/23 0500 05/05/23 0550 05/06/23 0535 05/07/23 0500 05/08/23 0800  WBC 15.6* 16.1* 13.7* 12.4* 11.3*  NEUTROABS 13.3* 13.3* 11.4* 10.1* 9.6*  HGB 7.5* 7.6* 7.1* 8.3* 7.7*  HCT 22.7* 22.9* 21.8* 25.3* 23.8*  MCV 85.0 84.5 86.2 86.3 85.9  PLT 291 287 262 237 239      No results found for: "HEPBSAG", "HEPBSAB", "HEPBIGM"    Microbiology:  Recent Results (from the past 240 hour(s))  Gastrointestinal Panel by PCR , Stool     Status: None   Collection Time: 04/30/23  5:50 AM   Specimen: Stool  Result Value Ref Range Status   Campylobacter species NOT DETECTED NOT DETECTED Final   Plesimonas shigelloides NOT DETECTED NOT DETECTED Final   Salmonella species NOT DETECTED NOT DETECTED Final   Yersinia enterocolitica NOT DETECTED NOT DETECTED Final   Vibrio species NOT DETECTED NOT DETECTED Final   Vibrio cholerae NOT DETECTED NOT DETECTED Final   Enteroaggregative E coli (EAEC) NOT DETECTED NOT DETECTED Final   Enteropathogenic E coli (EPEC) NOT DETECTED NOT DETECTED Final   Enterotoxigenic E coli (ETEC) NOT DETECTED NOT DETECTED Final   Shiga like toxin producing E coli (STEC) NOT DETECTED NOT DETECTED Final   Shigella/Enteroinvasive E coli (EIEC) NOT  DETECTED NOT DETECTED Final   Cryptosporidium NOT DETECTED NOT DETECTED Final   Cyclospora cayetanensis NOT DETECTED NOT DETECTED Final   Entamoeba histolytica NOT DETECTED NOT DETECTED Final   Giardia lamblia NOT DETECTED NOT DETECTED Final   Adenovirus F40/41 NOT DETECTED NOT DETECTED Final   Astrovirus NOT DETECTED NOT DETECTED Final   Norovirus GI/GII NOT DETECTED NOT DETECTED Final   Rotavirus A NOT DETECTED NOT DETECTED Final   Sapovirus (I, II, IV, and V) NOT DETECTED NOT DETECTED Final    Comment: Performed at [facility], [address] [place], [place] [address]    Coagulation Studies: No results for input(s): "LABPROT", "INR" in the last 72 hours.  Urinalysis: No  results for input(s): "COLORURINE", "LABSPEC", "PHURINE", "GLUCOSEU", "HGBUR", "BILIRUBINUR", "KETONESUR", "PROTEINUR", "UROBILINOGEN", "NITRITE", "LEUKOCYTESUR" in the last 72 hours.  Invalid input(s): "APPERANCEUR"    Imaging: US Venous Img Lower Bilateral (DVT)  Result Date: 05/06/2023 CLINICAL DATA:  Bilateral lower extremity edema. EXAM: BILATERAL LOWER EXTREMITY VENOUS DOPPLER ULTRASOUND TECHNIQUE: Gray-scale sonography with graded compression, as well as color Doppler and duplex ultrasound were performed to evaluate the lower extremity deep venous systems from the level of the common femoral vein and including the common femoral, femoral, profunda femoral, popliteal and calf veins including the posterior tibial, peroneal and gastrocnemius veins when visible. The superficial great saphenous vein was also interrogated. Spectral Doppler was utilized to evaluate flow at rest and with distal augmentation maneuvers in the common femoral, femoral and popliteal veins. COMPARISON:  None Available. FINDINGS: RIGHT LOWER EXTREMITY Common Femoral Vein: No evidence of thrombus. Normal compressibility, respiratory phasicity and response to augmentation. Saphenofemoral Junction: No evidence of thrombus. Normal compressibility and flow on color Doppler imaging. Profunda Femoral Vein: No evidence of thrombus. Normal compressibility and flow on color Doppler imaging. Femoral Vein: No evidence of thrombus. Normal compressibility, respiratory phasicity and response to augmentation. Popliteal Vein: No evidence of thrombus. Normal compressibility, respiratory phasicity and response to augmentation. Calf Veins: No evidence of thrombus. Normal compressibility and flow on color Doppler imaging. Superficial Great Saphenous Vein: No evidence of thrombus. Normal compressibility. Venous Reflux:  None. Other Findings: No evidence of superficial thrombophlebitis or abnormal fluid collection. LEFT LOWER EXTREMITY Common Femoral Vein:  Occlusive thrombus identified. Saphenofemoral Junction: Thrombus is present at the level of the saphenofemoral junction. Profunda Femoral Vein: Thrombus present which appears nonocclusive with some flow demonstrated. Femoral Vein: Occlusive thrombus identified in the femoral vein. Popliteal Vein: Occlusive thrombus identified in the popliteal vein. Calf Veins: Posterior tibial vein appears to demonstrate some thrombus but is poorly visualized. The peroneal vein was not visualized. Superficial Great Saphenous Vein: No evidence of thrombus. Normal compressibility. Venous Reflux:  None. Other Findings: No evidence of superficial thrombophlebitis or abnormal fluid collection. IMPRESSION: 1. No evidence of right lower extremity deep venous thrombosis. 2. Extensive occlusive and nonocclusive thrombus in the left lower extremity from the common femoral vein through the popliteal vein and likely involving the posterior tibial vein. Electronically Signed   By: Irish Lack M.D.   On: 05/06/2023 17:02     Medications:    albumin human 25 g (05/08/23 1155)   sodium bicarbonate 150 mEq in sterile water 1,150 mL infusion 50 mL/hr at 05/08/23 1213     (feeding supplement) PROSource Plus  30 mL Oral TID BM   vitamin C  500 mg Oral BID   Chlorhexidine Gluconate Cloth  6 each Topical Daily   copper  8 mg Oral Daily  feeding supplement (KATE FARMS STANDARD 1.4)  325 mL Oral TID BM   folic acid  1 mg Oral Daily   heparin injection (subcutaneous)  5,000 Units Subcutaneous Q8H   insulin aspart  0-15 Units Subcutaneous TID WC   lactase  6,000 Units Oral TID WC   levothyroxine  100 mcg Oral Q0600   lipase/protease/amylase  24,000 Units Oral TID AC   metoprolol succinate  12.5 mg Oral Daily   mirtazapine  15 mg Oral QHS   multivitamin with minerals  1 tablet Oral BID   pantoprazole  40 mg Oral QHS   potassium chloride  40 mEq Oral Once   rifaximin  550 mg Oral TID   thiamine  100 mg Oral Daily   vitamin A   10,000 Units Oral Daily   Vitamin D (Ergocalciferol)  50,000 Units Oral Q7 days   Vitamin E  400 Units Oral Daily   zinc sulfate  220 mg Oral Daily   acetaminophen, bismuth subsalicylate, loperamide, polyethylene glycol  Assessment/ Plan:  47 y.o. male with super morbid obesity, hypertension, obstructive sleep apnea, lymphedema, history of bariatric surgery, cholelithiasis, severe hypothyroidism, bedridden status, history of IVC filter placement admitted on 04/21/2023 for Severe hypothyroidism [E03.9] Generalized weakness [R53.1] Lower limb ulcer, ankle, right, with unspecified severity (HCC) [L97.319] Hypothyroidism, unspecified type [E03.9] Sepsis, due to unspecified organism, unspecified whether acute organ dysfunction present (HCC) [A41.9]   Acute kidney injury Likely secondary to hemodynamic alterations caused by septic shock and hypotension and possibly Vancomycin toxicity leading to ATN. May have a component of infectious   Renal function continues to decline. Will reorder albumin for fluid support.   Lab Results  Component Value Date   CREATININE 3.13 (H) 05/08/2023   BUN 63 (H) 05/08/2023   NA 134 (L) 05/08/2023   K 3.4 (L) 05/08/2023   CL 110 05/08/2023   CO2 15 (L) 05/08/2023     Generalized edema Patient has hypoalbuminemia and malnutrition.  Patient received IV albumin for several days. Will reorder albumin twice a day for the weekend and evaluate.   Hyponatremia Likely secondary to AKI and generalized volume overload. Sodium 134  Hypotension, received pressor support in ICU. Midodrine stopped.  Blood pressure 114/80  Chronic systolic CHF LVEF 35 to 40%, moderately decreased function, global hypokinesis, grade 1 diastolic dysfunction, normal right ventricular systolic function from echo 04/22/2023.  Acute metabolic acidosis, serum bicarb 16, will order Sodium bicarb infusion .   Hypokalemia, resolved  Will continue to follow patient peripherally.   LOS:  17 Christopher Herman 5/10/20241:18 PM  [address] [place], [place] [phone]

## 2023-05-08 NOTE — TOC Progression Note (Addendum)
Transition of Care [facility]) - Progression Note    Patient Details  Name: Christopher Herman MRN: [id number] Date of Birth: [birth year]  Transition of Care [facility]) CM/SW Contact  Truddie Hidden, RN Phone Number: 05/08/2023, 11:22 AM  Clinical Narrative:    Spoke with [facility] Financial Navigator, Veronia Beets Patient is not showing as pending. She will check with DSS. If patient is not pending the application will be initiated by [facility]  FL2 completed. LTR bed search started.    Expected Discharge Plan: Skilled Nursing Facility Barriers to Discharge: Continued Medical Work up  Expected Discharge Plan and Services     Post Acute Care Choice: Skilled Nursing Facility Living arrangements for the past 2 months: Single Family Home                                       Social Determinants of Health (SDOH) Interventions SDOH Screenings   Food Insecurity: No Food Insecurity (11/04/2022)  Housing: Low Risk  (11/04/2022)  Transportation Needs: No Transportation Needs (11/04/2022)  Alcohol Screen: Low Risk  (09/13/2018)  Depression (PHQ2-9): Low Risk  (11/04/2022)  Physical Activity: Inactive (11/04/2022)  Tobacco Use: Medium Risk (04/21/2023)    Readmission Risk Interventions     No data to display

## 2023-05-08 NOTE — TOC Progression Note (Signed)
Transition of Care [facility]) - Progression Note    Patient Details  Name: Christopher Herman MRN: [id number] Date of Birth: June 30, 1976  Transition of Care [facility]) CM/SW Contact  Truddie Hidden, RN Phone Number: 05/08/2023, 11:04 AM  Clinical Narrative:    Spoke with patient at bedside. He had questions about FMLA forms.  Provided patient with contacts for his FMLA forms to be sent. He was advised the MD would assist with forms. Patient is agreeable to LTR. He was advised he would have to have LTSS. He does not have a preference.    Expected Discharge Plan: Skilled Nursing Facility Barriers to Discharge: Continued Medical Work up  Expected Discharge Plan and Services     Post Acute Care Choice: Skilled Nursing Facility Living arrangements for the past 2 months: Single Family Home                                       Social Determinants of Health (SDOH) Interventions SDOH Screenings   Food Insecurity: No Food Insecurity (11/04/2022)  Housing: Low Risk  (11/04/2022)  Transportation Needs: No Transportation Needs (11/04/2022)  Alcohol Screen: Low Risk  (09/13/2018)  Depression (PHQ2-9): Low Risk  (11/04/2022)  Physical Activity: Inactive (11/04/2022)  Tobacco Use: Medium Risk (04/21/2023)    Readmission Risk Interventions     No data to display

## 2023-05-08 NOTE — TOC Progression Note (Signed)
Transition of Care [facility]) - Progression Note    Patient Details  Name: Christopher Herman MRN: [id number] Date of Birth: 02-09-1976  Transition of Care [facility]) CM/SW Contact  Truddie Hidden, RN Phone Number: 05/08/2023, 3:57 PM  Clinical Narrative:    Spoke with patient and his mother. He mother stated she is agreeable to the decision the patient makes. She is prepared to takes care of him if he returns home. Patient advised he has a bed offer for [facility]. He is agreeable to that facility. He has been advised his Medicaid application may be pending already and if not will be started.    Expected Discharge Plan: Skilled Nursing Facility Barriers to Discharge: Continued Medical Work up  Expected Discharge Plan and Services     Post Acute Care Choice: Skilled Nursing Facility Living arrangements for the past 2 months: Single Family Home                                       Social Determinants of Health (SDOH) Interventions SDOH Screenings   Food Insecurity: No Food Insecurity (11/04/2022)  Housing: Low Risk  (11/04/2022)  Transportation Needs: No Transportation Needs (11/04/2022)  Alcohol Screen: Low Risk  (09/13/2018)  Depression (PHQ2-9): Low Risk  (11/04/2022)  Physical Activity: Inactive (11/04/2022)  Tobacco Use: Medium Risk (04/21/2023)    Readmission Risk Interventions     No data to display

## 2023-05-08 NOTE — Progress Notes (Addendum)
PROGRESS NOTE  Christopher Herman  WRU:[id number] DOB: 1976/01/13 DOA: 04/21/2023 PCP: Pcp, No   Brief Narrative: Patient is a 47 y.o. male with a PMH significant for severe obesity s/p bariatric surgery, OSA, hypoventilation syndrome, HTN, GERD, cholelithiasis, bilateral extremity, chronic lymphedema ,status post IVC filter placement, IDA who presented from home to the ED on 04/21/2023 with generalized weakness, extreme fatigue, unintended weight loss, disuse of upper extremities and poor p.o. intake.Marland Kitchen He is chronically bed bound and malnourished.  Initially septic shock was suspected, admitted to ICU, started on broad-spectrum antibiotics.  Also required vasopressors.  Vasopressors have been discontinued.  Currently blood pressure stable.  Hospital course remarkable for worsening kidney function.  Nephrology following, suspected to have ATN.  PT/OT recommending SNF/long-term care.  TOC following.  Palliative care was also consulted for goals of care.  Remains full code  Assessment & Plan:  Principal Problem:   Severe hypothyroidism Active Problems:   Malnutrition of moderate degree (HCC)   Generalized weakness   Ulcer of right ankle (HCC)   Hypotension due to hypovolemia   Acute renal failure due to tubular necrosis (HCC)   Hypothyroidism  Septic shock: Presented with severe hypotension.  Admitted for septic shock.  Initially required ICU care, given vasopressors.  Blood cultures showed staph auricularis, capitis.  Completed antibiotics course.  Blood pressure has been stable.  Currently on gentle bicarb drip, IV albumin  AKI: Worsening kidney function during this hospitalization.  Renal ultrasound negative for hydronephrosis or obstruction.  Suspected from ATN.  Serological workup has been negative so far.  Adequate urine output.  Nephrology following.  No indication for dialysis.  Creatinine currently in the range of 3  Chronic combined systolic /diastolic CHF: Echo done on 4/24 showed EF of  35 to 40%, global hypokinesis, grade 1 diastolic dysfunction.  Currently he looks euvolemic  Hypokalemia: Supplemented with  potassium  Severe hypothyroidism: Given IV Synthyroid.  Currently on oral Synthyroid.  Follow-up thyroid function test in 4 to 6 weeks.  Leukocytosis/thrombocytosis: Improving  Anemia of chronic disease: Got 2 units of blood transfusion during his hospitalization.  Continue monitoring.  Hemoglobin currently in the range of 7  Chronic sinus tachycardia: Chronic problem.  QTc also prolonged.  Avoid QTc prolonging medications.  On low-dose metoprolol  Depression: Continue Remeron  Chronic abdomen pain/diarrhea :He complains of chronic abdominal pain and fear of eating as it precipitates diarrhea.  RD has been seeing him and he is on several agents both to help with appetite and hopefully decrease some of his diarrhea and bloating sensations.  Morbid obesity/bedbound status: PT/OT recommending.  Long-term facility.  TOC following.  Goals of care: Morbidly obese patient with multiple medical problems.  History of bariatric surgery, OSA, obesity hypoventilation syndrome, chronic lymphedema, status post IVC filter placement. bedbound status.  Palliative care was following for goals of care, remains full code   Nutrition Problem: Moderate Malnutrition Etiology: chronic illness (h/o roux-en-y gastric bypass) Pressure Injury 04/21/23 Ankle Right Stage 2 -  Partial thickness loss of dermis presenting as a shallow open injury with a red, pink wound bed without slough. (Active)  04/21/23 0600  Location: Ankle  Location Orientation: Right  Staging: Stage 2 -  Partial thickness loss of dermis presenting as a shallow open injury with a red, pink wound bed without slough.  Wound Description (Comments):   Present on Admission: Yes  Dressing Type Foam - Lift dressing to assess site every shift 05/07/23 2008    DVT prophylaxis:heparin injection  5,000 Units Start: 04/27/23  2200     Code Status: Full Code  Family Communication: None at bedside  Patient status:Inpatient  Patient is from :Home  Anticipated discharge WU:JWJX vs SNF  Estimated DC date:not sure   Consultants: Nephrology, PCCM   Antimicrobials:  Anti-infectives (From admission, onward)    Start     Dose/Rate Route Frequency Ordered Stop   04/29/23 1200  rifaximin (XIFAXAN) tablet 550 mg        550 mg Oral 3 times daily 04/29/23 1111 05/13/23 0959   04/24/23 1210  vancomycin variable dose per unstable renal function (pharmacist dosing)  Status:  Discontinued         Does not apply See admin instructions 04/24/23 1210 05/01/23 1025   04/22/23 2200  vancomycin (VANCOREADY) IVPB 1750 mg/350 mL  Status:  Discontinued        1,750 mg 175 mL/hr over 120 Minutes Intravenous Every 12 hours 04/22/23 1141 04/24/23 1209   04/22/23 1800  cefTRIAXone (ROCEPHIN) 2 g in sodium chloride 0.9 % 100 mL IVPB  Status:  Discontinued        2 g 200 mL/hr over 30 Minutes Intravenous Daily 04/22/23 1201 04/24/23 1739   04/22/23 1230  vancomycin (VANCOREADY) IVPB 2000 mg/400 mL        2,000 mg 200 mL/hr over 120 Minutes Intravenous  Once 04/22/23 1141 04/22/23 1533   04/21/23 1500  vancomycin (VANCOREADY) IVPB 2000 mg/400 mL  Status:  Discontinued        2,000 mg 200 mL/hr over 120 Minutes Intravenous Every 12 hours 04/21/23 0645 04/21/23 1253   04/21/23 1400  linezolid (ZYVOX) IVPB 600 mg  Status:  Discontinued        600 mg 300 mL/hr over 60 Minutes Intravenous Every 12 hours 04/21/23 1253 04/22/23 1130   04/21/23 0730  piperacillin-tazobactam (ZOSYN) IVPB 3.375 g        3.375 g 12.5 mL/hr over 240 Minutes Intravenous Every 8 hours 04/21/23 0638 04/22/23 1328   04/21/23 0215  ceFEPIme (MAXIPIME) 2 g in sodium chloride 0.9 % 100 mL IVPB        2 g 200 mL/hr over 30 Minutes Intravenous  Once 04/21/23 0204 04/21/23 0333   04/21/23 0215  vancomycin (VANCOCIN) IVPB 1000 mg/200 mL premix       See  Hyperspace for full Linked Orders Report.   1,000 mg 200 mL/hr over 60 Minutes Intravenous  Once 04/21/23 0211 04/21/23 0420   04/21/23 0215  vancomycin (VANCOREADY) IVPB 1500 mg/300 mL       See Hyperspace for full Linked Orders Report.   1,500 mg 150 mL/hr over 120 Minutes Intravenous  Once 04/21/23 0211 04/21/23 0706       Subjective: Patient seen and examined at bedside this afternoon.  Hemodynamically stable.  Lying in bed.  Comfortable, no new complaints.  EKG monitor shows heart rate in the range of 110.  Denies any shortness of breath, cough or abdominal pain.  Objective: Vitals:   05/08/23 0900 05/08/23 1000 05/08/23 1100 05/08/23 1133  BP:  114/80  118/82  Pulse:  (!) 108 (!) 104 (!) 109  Resp: 19 20 17 20   Temp:    98.8 F (37.1 C)  TempSrc:      SpO2:  99% 98% 100%  Weight:      Height:        Intake/Output Summary (Last 24 hours) at 05/08/2023 1253 Last data filed at 05/08/2023 1035 Gross per 24 hour  Intake 240 ml  Output --  Net 240 ml   Filed Weights   05/07/23 1116 05/08/23 0702 05/08/23 0825  Weight: (!) 142.7 kg (!) 145.6 kg (!) 145.2 kg    Examination:  General exam: Overall comfortable, not in distress, super morbidly obese HEENT: PERRL Respiratory system:  no wheezes or crackles, diminished air sounds on bases Cardiovascular system: S1 & S2 heard, RRR.  Central line on the right neck Gastrointestinal system: Abdomen is obese, soft and nontender. Central nervous system: Alert and oriented Extremities: Bilateral lower EXTR lymphedema, no clubbing ,no cyanosis, chronic skin changes Skin: No rashes, no icterus     Data Reviewed: I have personally reviewed following labs and imaging studies  CBC: Recent Labs  Lab 05/04/23 0500 05/05/23 0550 05/06/23 0535 05/07/23 0500 05/08/23 0800  WBC 15.6* 16.1* 13.7* 12.4* 11.3*  NEUTROABS 13.3* 13.3* 11.4* 10.1* 9.6*  HGB 7.5* 7.6* 7.1* 8.3* 7.7*  HCT 22.7* 22.9* 21.8* 25.3* 23.8*  MCV 85.0 84.5  86.2 86.3 85.9  PLT 291 287 262 237 239   Basic Metabolic Panel: Recent Labs  Lab 05/03/23 0500 05/04/23 0500 05/05/23 0550 05/06/23 0535 05/07/23 0500 05/08/23 0800  NA 134* 133* 134* 135 134* 134*  K 2.9* 3.2* 2.7* 3.9 3.6 3.4*  CL 110 107 107 111 109 110  CO2 18* 16* 17* 16* 14* 15*  GLUCOSE 106* 107* 95 84 89 96  BUN 55* 59* 63* 65* 65* 63*  CREATININE 2.16* 2.42* 2.64* 2.67* 2.74* 3.13*  CALCIUM 7.7* 8.2* 8.2* 8.2* 8.4* 8.1*  MG 2.0  --   --   --   --   --   PHOS 2.7 3.0 3.0 3.5 3.9 4.1     Recent Results (from the past 240 hour(s))  Gastrointestinal Panel by PCR , Stool     Status: None   Collection Time: 04/30/23  5:50 AM   Specimen: Stool  Result Value Ref Range Status   Campylobacter species NOT DETECTED NOT DETECTED Final   Plesimonas shigelloides NOT DETECTED NOT DETECTED Final   Salmonella species NOT DETECTED NOT DETECTED Final   Yersinia enterocolitica NOT DETECTED NOT DETECTED Final   Vibrio species NOT DETECTED NOT DETECTED Final   Vibrio cholerae NOT DETECTED NOT DETECTED Final   Enteroaggregative E coli (EAEC) NOT DETECTED NOT DETECTED Final   Enteropathogenic E coli (EPEC) NOT DETECTED NOT DETECTED Final   Enterotoxigenic E coli (ETEC) NOT DETECTED NOT DETECTED Final   Shiga like toxin producing E coli (STEC) NOT DETECTED NOT DETECTED Final   Shigella/Enteroinvasive E coli (EIEC) NOT DETECTED NOT DETECTED Final   Cryptosporidium NOT DETECTED NOT DETECTED Final   Cyclospora cayetanensis NOT DETECTED NOT DETECTED Final   Entamoeba histolytica NOT DETECTED NOT DETECTED Final   Giardia lamblia NOT DETECTED NOT DETECTED Final   Adenovirus F40/41 NOT DETECTED NOT DETECTED Final   Astrovirus NOT DETECTED NOT DETECTED Final   Norovirus GI/GII NOT DETECTED NOT DETECTED Final   Rotavirus A NOT DETECTED NOT DETECTED Final   Sapovirus (I, II, IV, and V) NOT DETECTED NOT DETECTED Final    Comment: Performed at [facility], [address],  [place], [place] [address]     Radiology Studies: US Venous Img Lower Bilateral (DVT)  Result Date: 05/06/2023 CLINICAL DATA:  Bilateral lower extremity edema. EXAM: BILATERAL LOWER EXTREMITY VENOUS DOPPLER ULTRASOUND TECHNIQUE: Gray-scale sonography with graded compression, as well as color Doppler and duplex ultrasound were performed to evaluate the lower extremity deep venous systems  from the level of the common femoral vein and including the common femoral, femoral, profunda femoral, popliteal and calf veins including the posterior tibial, peroneal and gastrocnemius veins when visible. The superficial great saphenous vein was also interrogated. Spectral Doppler was utilized to evaluate flow at rest and with distal augmentation maneuvers in the common femoral, femoral and popliteal veins. COMPARISON:  None Available. FINDINGS: RIGHT LOWER EXTREMITY Common Femoral Vein: No evidence of thrombus. Normal compressibility, respiratory phasicity and response to augmentation. Saphenofemoral Junction: No evidence of thrombus. Normal compressibility and flow on color Doppler imaging. Profunda Femoral Vein: No evidence of thrombus. Normal compressibility and flow on color Doppler imaging. Femoral Vein: No evidence of thrombus. Normal compressibility, respiratory phasicity and response to augmentation. Popliteal Vein: No evidence of thrombus. Normal compressibility, respiratory phasicity and response to augmentation. Calf Veins: No evidence of thrombus. Normal compressibility and flow on color Doppler imaging. Superficial Great Saphenous Vein: No evidence of thrombus. Normal compressibility. Venous Reflux:  None. Other Findings: No evidence of superficial thrombophlebitis or abnormal fluid collection. LEFT LOWER EXTREMITY Common Femoral Vein: Occlusive thrombus identified. Saphenofemoral Junction: Thrombus is present at the level of the saphenofemoral junction. Profunda Femoral Vein: Thrombus present which appears  nonocclusive with some flow demonstrated. Femoral Vein: Occlusive thrombus identified in the femoral vein. Popliteal Vein: Occlusive thrombus identified in the popliteal vein. Calf Veins: Posterior tibial vein appears to demonstrate some thrombus but is poorly visualized. The peroneal vein was not visualized. Superficial Great Saphenous Vein: No evidence of thrombus. Normal compressibility. Venous Reflux:  None. Other Findings: No evidence of superficial thrombophlebitis or abnormal fluid collection. IMPRESSION: 1. No evidence of right lower extremity deep venous thrombosis. 2. Extensive occlusive and nonocclusive thrombus in the left lower extremity from the common femoral vein through the popliteal vein and likely involving the posterior tibial vein. Electronically Signed   By: Irish Lack M.D.   On: 05/06/2023 17:02    Scheduled Meds:  (feeding supplement) PROSource Plus  30 mL Oral TID BM   vitamin C  500 mg Oral BID   Chlorhexidine Gluconate Cloth  6 each Topical Daily   copper  8 mg Oral Daily   feeding supplement (KATE FARMS STANDARD 1.4)  325 mL Oral TID BM   folic acid  1 mg Oral Daily   heparin injection (subcutaneous)  5,000 Units Subcutaneous Q8H   insulin aspart  0-15 Units Subcutaneous TID WC   lactase  6,000 Units Oral TID WC   levothyroxine  100 mcg Oral Q0600   lipase/protease/amylase  24,000 Units Oral TID AC   metoprolol succinate  12.5 mg Oral Daily   mirtazapine  15 mg Oral QHS   multivitamin with minerals  1 tablet Oral BID   pantoprazole  40 mg Oral QHS   rifaximin  550 mg Oral TID   thiamine  100 mg Oral Daily   vitamin A  10,000 Units Oral Daily   Vitamin D (Ergocalciferol)  50,000 Units Oral Q7 days   Vitamin E  400 Units Oral Daily   zinc sulfate  220 mg Oral Daily   Continuous Infusions:  albumin human 25 g (05/08/23 1155)   sodium bicarbonate 150 mEq in sterile water 1,150 mL infusion 50 mL/hr at 05/08/23 1213     LOS: 17 days   Burnadette Pop,  MD [facility] P5/09/2023, 12:53 PM

## 2023-05-08 NOTE — [place] FL2 (Signed)
[place] MEDICAID FL2 LEVEL OF CARE FORM     IDENTIFICATION  Patient Name: Christopher Herman Birthdate: 08-27-1976 Sex: male Admission Date (Current Location): 04/21/2023  [facility] and [place] Number:  Chiropodist and Address:  [facility], [address] [place], [place] [address]      Provider Number: [id number]  Attending Physician Name and Address:  Burnadette Pop, MD  Relative Name and Phone Number:  Geroge Baseman (Mother) [phone] (Home Phone)    Current Level of Care: Hospital Recommended Level of Care: Skilled Nursing Facility Prior Approval Number:    Date Approved/Denied:   PASRR Number: [id number]  Discharge Plan: Home    Current Diagnoses: Patient Active Problem List   Diagnosis Date Noted   Acute renal failure due to tubular necrosis (HCC) 04/26/2023   Hypothyroidism 04/26/2023   Hypotension due to hypovolemia 04/24/2023   Malnutrition of moderate degree (HCC) 04/23/2023   Generalized weakness 04/23/2023   Ulcer of right ankle (HCC) 04/23/2023   Severe hypothyroidism 04/21/2023   Chronic diarrhea of unknown origin 07/11/2022   Muscle spasm of both lower legs 02/20/2022   Severe protein-calorie malnutrition (HCC) 02/20/2022   Diarrhea    Rhabdomyolysis 02/14/2022   Electrolyte abnormality 02/13/2022   AKI (acute kidney injury) (HCC) 02/12/2022   Sepsis (HCC) 02/11/2022   S/P IVC filter 09/11/2015   Cholelithiasis 09/11/2015   Anxiety 09/10/2015   Edema 09/10/2015   Knee pain 09/10/2015   Groin pain 09/10/2015   Vitamin D deficiency 09/10/2015   Obesity, morbid (HCC) 09/10/2015   Anemia, iron deficiency 09/25/2009   Pain in joint, multiple sites 09/22/2009   Essential (primary) hypertension 05/31/2009   Lipoma of lower extremity 05/31/2009   Palpitations 05/31/2009   GERD (gastroesophageal reflux disease) 05/31/2009   Obstructive sleep apnea 12/29/1998    Orientation RESPIRATION BLADDER Height & Weight      Self, Time, Situation, Place  Normal External catheter Weight: (!) 145.2 kg Height:  5\' 5"  (165.1 cm)  BEHAVIORAL SYMPTOMS/MOOD NEUROLOGICAL BOWEL NUTRITION STATUS  Other (Comment) (n/a)  (n/a) Incontinent Diet  AMBULATORY STATUS COMMUNICATION OF NEEDS Skin   Extensive Assist Verbally PU Stage and Appropriate Care (R ankle-foam, Weeping abrasion LLE, L anterior thigh foam)                       Personal Care Assistance Level of Assistance  Bathing, Feeding, Dressing Bathing Assistance: Limited assistance Feeding assistance: Limited assistance Dressing Assistance: Limited assistance     Functional Limitations Info  Sight, Hearing, Speech Sight Info: Adequate Hearing Info: Adequate Speech Info: Adequate    SPECIAL CARE FACTORS FREQUENCY                       Contractures Contractures Info: Not present    Additional Factors Info  Code Status, Allergies Code Status Info: FULL Allergies Info: No Known Allergies           Current Medications (05/08/2023):  This is the current hospital active medication list Current Facility-Administered Medications  Medication Dose Route Frequency Provider Last Rate Last Admin   (feeding supplement) PROSource Plus liquid 30 mL  30 mL Oral TID BM Agbata, Tochukwu, MD   30 mL at 05/06/23 1748   acetaminophen (TYLENOL) tablet 650 mg  650 mg Oral Q6H PRN Leeroy Bock, MD   650 mg at 04/26/23 1707   albumin human 25 % solution 25 g  25 g Intravenous BID Wendee Beavers,  NP       ascorbic acid (VITAMIN C) tablet 500 mg  500 mg Oral BID Erin Fulling, MD   500 mg at 05/08/23 0901   bismuth subsalicylate (PEPTO BISMOL) 262 MG/15ML suspension 30 mL  30 mL Oral TID PRN Leeroy Bock, MD   30 mL at 04/30/23 4098   Chlorhexidine Gluconate Cloth 2 % PADS 6 each  6 each Topical Daily Erin Fulling, MD   6 each at 05/07/23 1343   copper tablet 8 mg  8 mg Oral Daily Leeroy Bock, MD   8 mg at 05/08/23 0901   feeding  supplement (KATE FARMS STANDARD 1.4) liquid 325 mL  325 mL Oral TID BM Leeroy Bock, MD   325 mL at 05/07/23 1306   folic acid (FOLVITE) tablet 1 mg  1 mg Oral Daily Kasa, Kurian, MD   1 mg at 05/08/23 0901   heparin injection 5,000 Units  5,000 Units Subcutaneous Q8H Cordella Register A, RPH   5,000 Units at 05/08/23 0700   insulin aspart (novoLOG) injection 0-15 Units  0-15 Units Subcutaneous TID WC Leeroy Bock, MD   2 Units at 05/04/23 1714   lactase (LACTAID) tablet 6,000 Units  6,000 Units Oral TID WC Leeroy Bock, MD   6,000 Units at 05/08/23 0901   levothyroxine (SYNTHROID) tablet 100 mcg  100 mcg Oral Q0600 Pilar Jarvis, MD   100 mcg at 05/08/23 0700   lipase/protease/amylase (CREON) capsule 24,000 Units  24,000 Units Oral TID AC Lolita Patella B, MD   24,000 Units at 05/08/23 0901   loperamide (IMODIUM) capsule 2 mg  2 mg Oral PRN Leeroy Bock, MD   2 mg at 05/06/23 2212   metoprolol succinate (TOPROL-XL) 24 hr tablet 12.5 mg  12.5 mg Oral Daily Agbata, Tochukwu, MD   12.5 mg at 05/08/23 0901   mirtazapine (REMERON) tablet 15 mg  15 mg Oral QHS Clapacs, John T, MD   15 mg at 05/07/23 2115   multivitamin with minerals tablet 1 tablet  1 tablet Oral BID Agbata, Tochukwu, MD   1 tablet at 05/08/23 0901   pantoprazole (PROTONIX) EC tablet 40 mg  40 mg Oral QHS Orson Aloe, RPH   40 mg at 05/07/23 2115   polyethylene glycol (MIRALAX / GLYCOLAX) packet 17 g  17 g Oral Daily PRN Jimmye Norman, NP       rifaximin Burman Blacksmith) tablet 550 mg  550 mg Oral TID Lolita Patella B, MD   550 mg at 05/08/23 0901   sodium bicarbonate 150 mEq in sterile water 1,150 mL infusion   Intravenous Continuous Lateef, Munsoor, MD       thiamine (VITAMIN B1) tablet 100 mg  100 mg Oral Daily Erin Fulling, MD   100 mg at 05/08/23 0901   vitamin A capsule 10,000 Units  10,000 Units Oral Daily Erin Fulling, MD   10,000 Units at 05/07/23 1304   Vitamin D (Ergocalciferol)  (DRISDOL) 1.25 MG (50000 UNIT) capsule 50,000 Units  50,000 Units Oral Q7 days Erin Fulling, MD   50,000 Units at 05/07/23 1035   Vitamin E CAPS 400 Units  400 Units Oral Daily Erin Fulling, MD   400 Units at 05/07/23 1304   zinc sulfate capsule 220 mg  220 mg Oral Daily Erin Fulling, MD   220 mg at 05/08/23 0901     Discharge Medications: Please see discharge summary for a list of discharge medications.  Relevant Imaging Results:  Relevant Lab Results:   Additional Information SSN [id number]  Truddie Hidden, RN

## 2023-05-09 ENCOUNTER — Other Ambulatory Visit: Payer: Self-pay

## 2023-05-09 DIAGNOSIS — N170 Acute kidney failure with tubular necrosis: Secondary | ICD-10-CM | POA: Diagnosis not present

## 2023-05-09 DIAGNOSIS — R531 Weakness: Secondary | ICD-10-CM | POA: Diagnosis not present

## 2023-05-09 DIAGNOSIS — E039 Hypothyroidism, unspecified: Secondary | ICD-10-CM | POA: Diagnosis not present

## 2023-05-09 DIAGNOSIS — E861 Hypovolemia: Secondary | ICD-10-CM | POA: Diagnosis not present

## 2023-05-09 LAB — CBC WITH DIFFERENTIAL/PLATELET
Abs Immature Granulocytes: 0.25 10*3/uL — ABNORMAL HIGH (ref 0.00–0.07)
Basophils Absolute: 0.1 10*3/uL (ref 0.0–0.1)
Basophils Relative: 0 %
Eosinophils Absolute: 0.1 10*3/uL (ref 0.0–0.5)
Eosinophils Relative: 0 %
HCT: 22.7 % — ABNORMAL LOW (ref 39.0–52.0)
Hemoglobin: 7.3 g/dL — ABNORMAL LOW (ref 13.0–17.0)
Immature Granulocytes: 1 %
Lymphocytes Relative: 1 %
Lymphs Abs: 0.2 10*3/uL — ABNORMAL LOW (ref 0.7–4.0)
MCH: 28.1 pg (ref 26.0–34.0)
MCHC: 32.2 g/dL (ref 30.0–36.0)
MCV: 87.3 fL (ref 80.0–100.0)
Monocytes Absolute: 0.5 10*3/uL (ref 0.1–1.0)
Monocytes Relative: 2 %
Neutro Abs: 28.7 10*3/uL — ABNORMAL HIGH (ref 1.7–7.7)
Neutrophils Relative %: 96 %
Platelets: 197 10*3/uL (ref 150–400)
RBC: 2.6 MIL/uL — ABNORMAL LOW (ref 4.22–5.81)
RDW: 20.3 % — ABNORMAL HIGH (ref 11.5–15.5)
Smear Review: NORMAL
WBC: 29.8 10*3/uL — ABNORMAL HIGH (ref 4.0–10.5)
nRBC: 0 % (ref 0.0–0.2)

## 2023-05-09 LAB — BLOOD GAS, VENOUS
Acid-base deficit: 8.7 mmol/L — ABNORMAL HIGH (ref 0.0–2.0)
Bicarbonate: 16 mmol/L — ABNORMAL LOW (ref 20.0–28.0)
O2 Saturation: 99.9 %
Patient temperature: 37
pCO2, Ven: 29 mmHg — ABNORMAL LOW (ref 44–60)
pH, Ven: 7.35 (ref 7.25–7.43)
pO2, Ven: 143 mmHg — ABNORMAL HIGH (ref 32–45)

## 2023-05-09 LAB — BASIC METABOLIC PANEL
Anion gap: 7 (ref 5–15)
BUN: 63 mg/dL — ABNORMAL HIGH (ref 6–20)
CO2: 16 mmol/L — ABNORMAL LOW (ref 22–32)
Calcium: 8.1 mg/dL — ABNORMAL LOW (ref 8.9–10.3)
Chloride: 111 mmol/L (ref 98–111)
Creatinine, Ser: 3.03 mg/dL — ABNORMAL HIGH (ref 0.61–1.24)
GFR, Estimated: 25 mL/min — ABNORMAL LOW (ref >60)
Glucose, Bld: 115 mg/dL — ABNORMAL HIGH (ref 70–99)
Potassium: 3.4 mmol/L — ABNORMAL LOW (ref 3.5–5.1)
Sodium: 134 mmol/L — ABNORMAL LOW (ref 135–145)

## 2023-05-09 LAB — LACTIC ACID, PLASMA
Lactic Acid, Venous: 0.8 mmol/L (ref 0.5–1.9)
Lactic Acid, Venous: 0.7 mmol/L (ref 0.5–1.9)

## 2023-05-09 LAB — VANCOMYCIN, RANDOM: Vancomycin Rm: 13 ug/mL

## 2023-05-09 LAB — GLUCOSE, CAPILLARY
Glucose-Capillary: 108 mg/dL — ABNORMAL HIGH (ref 70–99)
Glucose-Capillary: 103 mg/dL — ABNORMAL HIGH (ref 70–99)

## 2023-05-09 LAB — PHOSPHORUS: Phosphorus: 3.3 mg/dL (ref 2.5–4.6)

## 2023-05-09 MED ORDER — VANCOMYCIN HCL 1250 MG/250ML IV SOLN
1250.0000 mg | Freq: Once | INTRAVENOUS | Status: AC
  Administered 2023-05-09: 1250 mg via INTRAVENOUS
  Filled 2023-05-09: qty 250

## 2023-05-09 MED ORDER — RIVAROXABAN 20 MG PO TABS: 20.0000 mg | ORAL_TABLET | Freq: Every day | ORAL | Status: DC

## 2023-05-09 MED ORDER — METOPROLOL TARTRATE 5 MG/5ML IV SOLN: 2.5000 mg | Freq: Once | INTRAVENOUS | Status: DC

## 2023-05-09 MED ORDER — SODIUM CHLORIDE 0.9 % IV SOLN
2.0000 g | INTRAVENOUS | Status: DC
  Administered 2023-05-09: 2 g via INTRAVENOUS
  Filled 2023-05-09: qty 20

## 2023-05-09 MED ORDER — VANCOMYCIN HCL 2000 MG/400ML IV SOLN
2000.0000 mg | Freq: Once | INTRAVENOUS | Status: DC
  Filled 2023-05-09: qty 400

## 2023-05-09 MED ORDER — SODIUM CHLORIDE 0.9 % IV BOLUS
500.0000 mL | Freq: Once | INTRAVENOUS | Status: AC
  Administered 2023-05-09: 500 mL via INTRAVENOUS

## 2023-05-09 MED ORDER — RIVAROXABAN 15 MG PO TABS
15.0000 mg | ORAL_TABLET | Freq: Two times a day (BID) | ORAL | Status: DC
  Administered 2023-05-09 – 2023-05-23 (×29): 15 mg via ORAL
  Filled 2023-05-09 (×30): qty 1

## 2023-05-09 MED ORDER — VANCOMYCIN HCL 500 MG/100ML IV SOLN
500.0000 mg | Freq: Once | INTRAVENOUS | Status: DC
  Filled 2023-05-09: qty 100

## 2023-05-09 MED ORDER — VANCOMYCIN VARIABLE DOSE PER UNSTABLE RENAL FUNCTION (PHARMACIST DOSING)
Status: DC
  Filled 2023-05-09: qty 1

## 2023-05-09 MED ORDER — SODIUM CHLORIDE 0.9 % IV BOLUS: 500.0000 mL | Freq: Once | INTRAVENOUS | Status: DC

## 2023-05-09 NOTE — Progress Notes (Signed)
[facility]  PROGRESS NOTE   Subjective:   Patient seen at bedside, awake and alert.  Denies any discomfort at this time.  Objective:  Vital signs: Blood pressure 110/67, pulse (!) 113, temperature 98.3 F (36.8 C), resp. rate 18, height 5\' 5"  (1.651 m), weight (!) 145.2 kg, SpO2 99 %.  Intake/Output Summary (Last 24 hours) at 05/09/2023 1312 Last data filed at 05/09/2023 1041 Gross per 24 hour  Intake 1984.66 ml  Output 650 ml  Net 1334.66 ml   Filed Weights   05/07/23 1116 05/08/23 0702 05/08/23 0825  Weight: (!) 142.7 kg (!) 145.6 kg (!) 145.2 kg     Physical Exam: General:  No acute distress  Head:  Normocephalic, atraumatic. Moist oral mucosal membranes  Eyes:  Anicteric  Neck:  Supple  Lungs:   Clear to auscultation, normal effort  Heart:  S1S2 no rubs  Abdomen:   Soft, nontender, bowel sounds present  Extremities: 2+ peripheral edema.  Neurologic:  Awake, alert, following commands  Skin:  No lesions  Access:     Basic Metabolic Panel: Recent Labs  Lab 05/03/23 0500 05/04/23 0500 05/05/23 0550 05/06/23 0535 05/07/23 0500 05/08/23 0800 05/09/23 0612  NA 134*   < > 134* 135 134* 134* 134*  K 2.9*   < > 2.7* 3.9 3.6 3.4* 3.4*  CL 110   < > 107 111 109 110 111  CO2 18*   < > 17* 16* 14* 15* 16*  GLUCOSE 106*   < > 95 84 89 96 115*  BUN 55*   < > 63* 65* 65* 63* 63*  CREATININE 2.16*   < > 2.64* 2.67* 2.74* 3.13* 3.03*  CALCIUM 7.7*   < > 8.2* 8.2* 8.4* 8.1* 8.1*  MG 2.0  --   --   --   --   --   --   PHOS 2.7   < > 3.0 3.5 3.9 4.1 3.3   < > = values in this interval not displayed.   GFR: Estimated Creatinine Clearance: 41.4 mL/min (A) (by C-G formula based on SCr of 3.03 mg/dL (H)).  Liver Function Tests: No results for input(s): "AST", "ALT", "ALKPHOS", "BILITOT", "PROT", "ALBUMIN" in the last 168 hours. No results for input(s): "LIPASE", "AMYLASE" in the last 168 hours. No results for input(s): "AMMONIA" in the last 168  hours.  CBC: Recent Labs  Lab 05/05/23 0550 05/06/23 0535 05/07/23 0500 05/08/23 0800 05/09/23 0612  WBC 16.1* 13.7* 12.4* 11.3* 29.8*  NEUTROABS 13.3* 11.4* 10.1* 9.6* 28.7*  HGB 7.6* 7.1* 8.3* 7.7* 7.3*  HCT 22.9* 21.8* 25.3* 23.8* 22.7*  MCV 84.5 86.2 86.3 85.9 87.3  PLT 287 262 237 239 197     HbA1C: Hemoglobin A1C  Date/Time Value Ref Range Status  02/11/2012 12:27 PM 5.8 4.2 - 6.3 % Final    Comment:    The American Diabetes Association recommends that a primary goal of therapy should be <7% and that physicians should reevaluate the treatment regimen in patients with HbA1c values consistently >8%.     Urinalysis: No results for input(s): "COLORURINE", "LABSPEC", "PHURINE", "GLUCOSEU", "HGBUR", "BILIRUBINUR", "KETONESUR", "PROTEINUR", "UROBILINOGEN", "NITRITE", "LEUKOCYTESUR" in the last 72 hours.  Invalid input(s): "APPERANCEUR"    Imaging: [place] EKG SITE RITE  Result Date: 05/08/2023 If Site Rite image not attached, placement could not be confirmed due to current cardiac rhythm.    Medications:    albumin human 25 g (05/09/23 0853)   sodium bicarbonate 150 mEq in  sterile water 1,150 mL infusion 50 mL/hr at 05/08/23 1830    (feeding supplement) PROSource Plus  30 mL Oral TID BM   vitamin C  500 mg Oral BID   Chlorhexidine Gluconate Cloth  6 each Topical Daily   copper  8 mg Oral Daily   feeding supplement (KATE FARMS STANDARD 1.4)  325 mL Oral TID BM   folic acid  1 mg Oral Daily   insulin aspart  0-15 Units Subcutaneous TID WC   lactase  6,000 Units Oral TID WC   levothyroxine  100 mcg Oral Q0600   lipase/protease/amylase  24,000 Units Oral TID AC   metoprolol succinate  12.5 mg Oral Daily   mirtazapine  15 mg Oral QHS   multivitamin with minerals  1 tablet Oral BID   pantoprazole  40 mg Oral QHS   rifaximin  550 mg Oral TID   Rivaroxaban  15 mg Oral BID WC   Followed by   Melene Muller ON 05/30/2023] rivaroxaban  20 mg Oral Q supper   thiamine  100 mg Oral  Daily   vitamin A  10,000 Units Oral Daily   Vitamin D (Ergocalciferol)  50,000 Units Oral Q7 days   Vitamin E  400 Units Oral Daily   zinc sulfate  220 mg Oral Daily    Assessment/ Plan:     47 year old male with history of morbid obesity, hypertension, obstructive sleep apnea, lymphedema, diabetes and chronic kidney disease number admitted with history of nonhealing ulcers and possible sepsis.  #1: Acute kidney injury: Patient has acute kidney injury on chronic kidney disease.  Renal indices are worsening.  This is probably secondary to overall decline in status.  Possibly secondary to septic ATN also.  #2: Hypertension: Continue metoprolol.  #3: Congestive heart failure: Will continue to monitor closely.  #4: Hyponatremia: This may be secondary to dilutional causes.  #5: Metabolic acidosis: Metabolic acidosis secondary to sepsis.  Will continue bicarb supplements.  Will follow.   LOS: 18 Lorain Childes, MD [facility] 5/11/20241:12 PM

## 2023-05-09 NOTE — Consult Note (Signed)
ANTICOAGULATION CONSULT NOTE - Initial Consult  Pharmacy Consult for Xarelto Indication: DVT  No Known Allergies  Patient Measurements: Height: 5\' 5"  (165.1 cm) Weight: (!) 145.2 kg (320 lb) IBW/kg (Calculated) : 61.5  Vital Signs: Temp: 98.2 F (36.8 C) (05/11 0900) Temp Source: Oral (05/11 0521) BP: 108/68 (05/11 1000) Pulse Rate: 117 (05/11 1000)  Labs: Recent Labs    05/07/23 0500 05/08/23 0800 05/09/23 0612  HGB 8.3* 7.7* 7.3*  HCT 25.3* 23.8* 22.7*  PLT 237 239 197  CREATININE 2.74* 3.13* 3.03*   Estimated Creatinine Clearance: 41.4 mL/min (A) (by C-G formula based on SCr of 3.03 mg/dL (H)).  Medical History: Past Medical History:  Diagnosis Date   AKI (acute kidney injury) (HCC)    Cholelithiasis    Chronic acquired lymphedema    GERD (gastroesophageal reflux disease)    Helicobacter pylori infection 09/10/2015   History of sepsis    Leg muscle spasm    Leukocytosis    Lipoma of both lower extremities    Palpitations 05/31/2009   Chronic, stable   Presence of IVC filter    Rhabdomyolysis    Sleep apnea    Tachycardia    Vitamin D deficiency     Assessment: Patient admitted with severe hypothyroidism and PMH relevant for severe obesity s/p bariatric surgery, OSA, hypoventilation syndrome, HTN, GERD, cholelithiasis, bilateral extremity, chronic lymphedema, status post IVC filter placement , and anemia if chronic disease. Patient consulted to start Xarelto for DVT.  Image: Extensive occlusive and nonocclusive thrombus in the left lower, extremity from the common femoral vein through the popliteal vein and likely involving the posterior tibial vein  Plan:  Start Xarelto 15mg  BID x 21 days followed by 20mg  daily. Heparin [place] for Dvt prophylaxis dc'd  Emila Steinhauser Rodriguez-Guzman PharmD, BCPS 05/09/2023 12:14 PM

## 2023-05-09 NOTE — Progress Notes (Signed)
PICC order received, pt had blood cultures repeated today, WBC elevated. Recommend proceeding with central line removal, line holiday before PICC insertion. Discussed with Dr. Nelson Chimes, OK to hold off on PICC for now. Molli Hazard, RN made aware.

## 2023-05-09 NOTE — Consult Note (Signed)
Pharmacy Antibiotic Note  Christopher Herman is a 47 y.o. male admitted on 04/21/2023 with sepsis.  Pharmacy has been consulted for vancomycin dosing.  Patient has AKI.  Plan: Last vancomycin dose was 1750 mg 4/26 @ 1159 Vancomycin random level 13 05/09/23 @ 1526 Give vancomycin 1250 mg IV x 1 Recheck random level 05/10/23 @ 1700 Will dose based on levels due to unstable renal function  Height: 5\' 5"  (165.1 cm) Weight: (!) 145.2 kg (320 lb) IBW/kg (Calculated) : 61.5  Temp (24hrs), Avg:99.2 F (37.3 C), Min:98.2 F (36.8 C), Max:101 F (38.3 C)  Recent Labs  Lab 05/05/23 0550 05/06/23 0535 05/07/23 0500 05/08/23 0800 05/09/23 0612 05/09/23 0700 05/09/23 0755  WBC 16.1* 13.7* 12.4* 11.3* 29.8*  --   --   CREATININE 2.64* 2.67* 2.74* 3.13* 3.03*  --   --   LATICACIDVEN  --   --   --   --   --  0.8 0.7    Estimated Creatinine Clearance: 41.4 mL/min (A) (by C-G formula based on SCr of 3.03 mg/dL (H)).    No Known Allergies  Antimicrobials this admission: vancomycin 4/23>>4/26, 5/11>> Zosyn 4/23 >> 4/24 Linezolid 4/23>>4/23  Dose adjustments this admission: N/A  Microbiology results: 4/23 blood cultures: 3/4 bottles LAC - oxacillin-susc S capitis R arm - oxacillin-resistant S capitis, oxacillin-resitant S auricularis.  4/25 blood cultures: 1/3 bottles S auricularis (susc pending) 4/30 blood cultures: no growth 5/11 blood cultures: pending  Thank you for allowing pharmacy to be a part of this patient's care.  Barrie Folk, PharmD 05/09/2023 3:51 PM

## 2023-05-09 NOTE — Progress Notes (Signed)
PROGRESS NOTE  Christopher Herman  WGN:[id number] DOB: 01/03/1976 DOA: 04/21/2023 PCP: Pcp, No   Brief Narrative: Patient is a 47 y.o. male with a PMH significant for severe obesity s/p bariatric surgery, OSA, hypoventilation syndrome, HTN, GERD, cholelithiasis, bilateral extremity, chronic lymphedema ,status post IVC filter placement, IDA who presented from home to the ED on 04/21/2023 with generalized weakness, extreme fatigue, unintended weight loss, disuse of upper extremities and poor p.o. intake.Marland Kitchen He is chronically bed bound and malnourished.  Initially septic shock was suspected, admitted to ICU, started on broad-spectrum antibiotics.  Also required vasopressors.  Vasopressors have been discontinued.  Currently blood pressure stable.  Hospital course remarkable for worsening kidney function.  Nephrology following, suspected to have ATN.  PT/OT recommending SNF/long-term care.  TOC following.  Palliative care was also consulted for goals of care.  Remains full code.  5/11: Overnight developed fever at 101.  Repeat blood cultures were sent.  Intermittent diarrhea with no acute change.  Worsening leukocytosis.  Prior blood cultures with Staphylococcus reticularis, resistant to oxacillin.  Restarting vancomycin while waiting for blood culture results. Lower extremity venous Doppler done on 5/8 was positive for significant left lower extremity DVT-starting him on Xarelto. If developed any respiratory compromise then will need VQ scan to rule out PE.  Assessment & Plan:  Principal Problem:   Severe hypothyroidism Active Problems:   Malnutrition of moderate degree (HCC)   Generalized weakness   Ulcer of right ankle (HCC)   Hypotension due to hypovolemia   Acute renal failure due to tubular necrosis (HCC)   Hypothyroidism  Septic shock: Presented with severe hypotension.  Admitted for septic shock.  Initially required ICU care, given vasopressors.  Blood cultures showed staph auricularis, capitis.   Completed antibiotics course.  Blood pressure has been stable.  Currently on gentle bicarb drip, IV albumin  5/11: Patient became febrile again overnight.  Repeat blood cultures were sent and also restarting vancomycin. Patient still has central line in place due to unable to get a peripheral line.  PICC line was also ordered.  Left lower extremity DVT.  Lower extremity venous Doppler was obtained on 5/8 which shows pretty significant left lower extremity DVT. -Start him on Xarelto -Monitor for any respiratory compromise-if developed any shortness of breath then will need VQ scan  AKI: Worsening kidney function during this hospitalization.  Renal ultrasound negative for hydronephrosis or obstruction.  Suspected from ATN.  Serological workup has been negative so far.  Adequate urine output.  Nephrology following.  No indication for dialysis.  Creatinine currently in the range of 3  Chronic combined systolic /diastolic CHF: Echo done on 4/24 showed EF of 35 to 40%, global hypokinesis, grade 1 diastolic dysfunction.  Currently he looks euvolemic  Hypokalemia: Improved.  Continue to monitor and replete as needed.  Severe hypothyroidism: Given IV Synthyroid.  Currently on oral Synthyroid.  Follow-up thyroid function test in 4 to 6 weeks.  Leukocytosis/thrombocytosis: Worsening leukocytosis today.  Anemia of chronic disease: Got 2 units of blood transfusion during his hospitalization.  Continue monitoring.  Hemoglobin currently in the range of 7  Chronic sinus tachycardia: Chronic problem.  QTc also prolonged.  Avoid QTc prolonging medications.  On low-dose metoprolol  Depression: Continue Remeron  Chronic abdomen pain/diarrhea :He complains of chronic abdominal pain and fear of eating as it precipitates diarrhea.  RD has been seeing him and he is on several agents both to help with appetite and hopefully decrease some of his diarrhea and bloating sensations.  No acute change as he will remain  high risk for C. Difficile.  Morbid obesity/bedbound status: PT/OT recommending.  Long-term facility.  TOC following.  Goals of care: Morbidly obese patient with multiple medical problems.  History of bariatric surgery, OSA, obesity hypoventilation syndrome, chronic lymphedema, status post IVC filter placement. bedbound status.  Palliative care was following for goals of care, remains full code   Nutrition Problem: Moderate Malnutrition Etiology: chronic illness (h/o roux-en-y gastric bypass) Pressure Injury 04/21/23 Ankle Right Stage 2 -  Partial thickness loss of dermis presenting as a shallow open injury with a red, pink wound bed without slough. (Active)  04/21/23 0600  Location: Ankle  Location Orientation: Right  Staging: Stage 2 -  Partial thickness loss of dermis presenting as a shallow open injury with a red, pink wound bed without slough.  Wound Description (Comments):   Present on Admission: Yes  Dressing Type Foam - Lift dressing to assess site every shift 05/09/23 0400    DVT prophylaxis: Rivaroxaban (XARELTO) tablet 15 mg  rivaroxaban (XARELTO) tablet 20 mg     Code Status: Full Code  Family Communication: None at bedside  Patient status:Inpatient  Patient is from :Home  Anticipated discharge ZO:XWRU vs SNF  Estimated DC date:not sure   Consultants: Nephrology, PCCM   Antimicrobials:  Anti-infectives (From admission, onward)    Start     Dose/Rate Route Frequency Ordered Stop   04/29/23 1200  rifaximin (XIFAXAN) tablet 550 mg        550 mg Oral 3 times daily 04/29/23 1111 05/13/23 0959   04/24/23 1210  vancomycin variable dose per unstable renal function (pharmacist dosing)  Status:  Discontinued         Does not apply See admin instructions 04/24/23 1210 05/01/23 1025   04/22/23 2200  vancomycin (VANCOREADY) IVPB 1750 mg/350 mL  Status:  Discontinued        1,750 mg 175 mL/hr over 120 Minutes Intravenous Every 12 hours 04/22/23 1141 04/24/23 1209    04/22/23 1800  cefTRIAXone (ROCEPHIN) 2 g in sodium chloride 0.9 % 100 mL IVPB  Status:  Discontinued        2 g 200 mL/hr over 30 Minutes Intravenous Daily 04/22/23 1201 04/24/23 1739   04/22/23 1230  vancomycin (VANCOREADY) IVPB 2000 mg/400 mL        2,000 mg 200 mL/hr over 120 Minutes Intravenous  Once 04/22/23 1141 04/22/23 1533   04/21/23 1500  vancomycin (VANCOREADY) IVPB 2000 mg/400 mL  Status:  Discontinued        2,000 mg 200 mL/hr over 120 Minutes Intravenous Every 12 hours 04/21/23 0645 04/21/23 1253   04/21/23 1400  linezolid (ZYVOX) IVPB 600 mg  Status:  Discontinued        600 mg 300 mL/hr over 60 Minutes Intravenous Every 12 hours 04/21/23 1253 04/22/23 1130   04/21/23 0730  piperacillin-tazobactam (ZOSYN) IVPB 3.375 g        3.375 g 12.5 mL/hr over 240 Minutes Intravenous Every 8 hours 04/21/23 0638 04/22/23 1328   04/21/23 0215  ceFEPIme (MAXIPIME) 2 g in sodium chloride 0.9 % 100 mL IVPB        2 g 200 mL/hr over 30 Minutes Intravenous  Once 04/21/23 0204 04/21/23 0333   04/21/23 0215  vancomycin (VANCOCIN) IVPB 1000 mg/200 mL premix       See Hyperspace for full Linked Orders Report.   1,000 mg 200 mL/hr over 60 Minutes Intravenous  Once 04/21/23 0211 04/21/23 0420  04/21/23 0215  vancomycin (VANCOREADY) IVPB 1500 mg/300 mL       See Hyperspace for full Linked Orders Report.   1,500 mg 150 mL/hr over 120 Minutes Intravenous  Once 04/21/23 0211 04/21/23 0706       Subjective: Patient was seen and examined today.  Feeling little heart.  Continue to have intermittent diarrhea and abdominal pain but no recent change.  Denies any upper respiratory symptoms or shortness of breath.  Objective: Vitals:   05/09/23 1000 05/09/23 1100 05/09/23 1200 05/09/23 1300  BP: 108/68 108/69 104/68 110/67  Pulse: (!) 117 (!) 117 (!) 116 (!) 113  Resp: 17 19 18 18   Temp:  98.3 F (36.8 C)    TempSrc:      SpO2: 100% 100% 99% 99%  Weight:      Height:        Intake/Output  Summary (Last 24 hours) at 05/09/2023 1457 Last data filed at 05/09/2023 1041 Gross per 24 hour  Intake 1984.66 ml  Output 650 ml  Net 1334.66 ml    Filed Weights   05/07/23 1116 05/08/23 0702 05/08/23 0825  Weight: (!) 142.7 kg (!) 145.6 kg (!) 145.2 kg    Examination:  General.  Morbidly obese gentleman, in no acute distress. Pulmonary.  Lungs clear bilaterally, normal respiratory effort. CV.  Regular rate and rhythm, no JVD, rub or murmur. Abdomen.  Soft, nontender, nondistended, BS positive. CNS.  Alert and oriented .  No focal neurologic deficit. Extremities.  Bilateral lower extremity lymphedema Psychiatry.  Judgment and insight appears normal.     Data Reviewed: I have personally reviewed following labs and imaging studies  CBC: Recent Labs  Lab 05/05/23 0550 05/06/23 0535 05/07/23 0500 05/08/23 0800 05/09/23 0612  WBC 16.1* 13.7* 12.4* 11.3* 29.8*  NEUTROABS 13.3* 11.4* 10.1* 9.6* 28.7*  HGB 7.6* 7.1* 8.3* 7.7* 7.3*  HCT 22.9* 21.8* 25.3* 23.8* 22.7*  MCV 84.5 86.2 86.3 85.9 87.3  PLT 287 262 237 239 197    Basic Metabolic Panel: Recent Labs  Lab 05/03/23 0500 05/04/23 0500 05/05/23 0550 05/06/23 0535 05/07/23 0500 05/08/23 0800 05/09/23 0612  NA 134*   < > 134* 135 134* 134* 134*  K 2.9*   < > 2.7* 3.9 3.6 3.4* 3.4*  CL 110   < > 107 111 109 110 111  CO2 18*   < > 17* 16* 14* 15* 16*  GLUCOSE 106*   < > 95 84 89 96 115*  BUN 55*   < > 63* 65* 65* 63* 63*  CREATININE 2.16*   < > 2.64* 2.67* 2.74* 3.13* 3.03*  CALCIUM 7.7*   < > 8.2* 8.2* 8.4* 8.1* 8.1*  MG 2.0  --   --   --   --   --   --   PHOS 2.7   < > 3.0 3.5 3.9 4.1 3.3   < > = values in this interval not displayed.      Recent Results (from the past 240 hour(s))  Gastrointestinal Panel by PCR , Stool     Status: None   Collection Time: 04/30/23  5:50 AM   Specimen: Stool  Result Value Ref Range Status   Campylobacter species NOT DETECTED NOT DETECTED Final   Plesimonas shigelloides  NOT DETECTED NOT DETECTED Final   Salmonella species NOT DETECTED NOT DETECTED Final   Yersinia enterocolitica NOT DETECTED NOT DETECTED Final   Vibrio species NOT DETECTED NOT DETECTED Final   Vibrio cholerae NOT DETECTED  NOT DETECTED Final   Enteroaggregative E coli (EAEC) NOT DETECTED NOT DETECTED Final   Enteropathogenic E coli (EPEC) NOT DETECTED NOT DETECTED Final   Enterotoxigenic E coli (ETEC) NOT DETECTED NOT DETECTED Final   Shiga like toxin producing E coli (STEC) NOT DETECTED NOT DETECTED Final   Shigella/Enteroinvasive E coli (EIEC) NOT DETECTED NOT DETECTED Final   Cryptosporidium NOT DETECTED NOT DETECTED Final   Cyclospora cayetanensis NOT DETECTED NOT DETECTED Final   Entamoeba histolytica NOT DETECTED NOT DETECTED Final   Giardia lamblia NOT DETECTED NOT DETECTED Final   Adenovirus F40/41 NOT DETECTED NOT DETECTED Final   Astrovirus NOT DETECTED NOT DETECTED Final   Norovirus GI/GII NOT DETECTED NOT DETECTED Final   Rotavirus A NOT DETECTED NOT DETECTED Final   Sapovirus (I, II, IV, and V) NOT DETECTED NOT DETECTED Final    Comment: Performed at [facility], [address], [place], [place] [address]     Radiology Studies: [place] EKG SITE RITE  Result Date: 05/08/2023 If Site Rite image not attached, placement could not be confirmed due to current cardiac rhythm.   Scheduled Meds:  (feeding supplement) PROSource Plus  30 mL Oral TID BM   vitamin C  500 mg Oral BID   Chlorhexidine Gluconate Cloth  6 each Topical Daily   copper  8 mg Oral Daily   feeding supplement (KATE FARMS STANDARD 1.4)  325 mL Oral TID BM   folic acid  1 mg Oral Daily   insulin aspart  0-15 Units Subcutaneous TID WC   lactase  6,000 Units Oral TID WC   levothyroxine  100 mcg Oral Q0600   lipase/protease/amylase  24,000 Units Oral TID AC   metoprolol succinate  12.5 mg Oral Daily   mirtazapine  15 mg Oral QHS   multivitamin with minerals  1 tablet Oral BID   pantoprazole  40 mg Oral  QHS   rifaximin  550 mg Oral TID   Rivaroxaban  15 mg Oral BID WC   Followed by   Melene Muller ON 05/30/2023] rivaroxaban  20 mg Oral Q supper   thiamine  100 mg Oral Daily   vitamin A  10,000 Units Oral Daily   Vitamin D (Ergocalciferol)  50,000 Units Oral Q7 days   Vitamin E  400 Units Oral Daily   zinc sulfate  220 mg Oral Daily   Continuous Infusions:  albumin human 25 g (05/09/23 0853)   sodium bicarbonate 150 mEq in sterile water 1,150 mL infusion 50 mL/hr at 05/08/23 1830     LOS: 18 days   Arnetha Courser, MD [facility] P5/10/2023, 2:57 PM

## 2023-05-09 NOTE — Progress Notes (Signed)
   05/09/23 0400  Assess: MEWS Score  Temp (!) 101 F (38.3 C)  BP (!) 91/58  MAP (mmHg) 67  ECG Heart Rate (!) 139  Resp 20  Level of Consciousness Alert  Assess: MEWS Score  MEWS Temp 1  MEWS Systolic 1  MEWS Pulse 3  MEWS RR 0  MEWS LOC 0  MEWS Score 5  MEWS Score Color Red  Assess: if the MEWS score is Yellow or Red  Were vital signs taken at a resting state? Yes  Focused Assessment No change from prior assessment  Does the patient meet 2 or more of the SIRS criteria? Yes  Does the patient have a confirmed or suspected source of infection? No  MEWS guidelines implemented  Yes, red  Treat  MEWS Interventions Considered administering scheduled or prn medications/treatments as ordered  Take Vital Signs  Increase Vital Sign Frequency  Red: Q1hr x2, continue Q4hrs until patient remains green for 12hrs  Escalate  MEWS: Escalate Red: Discuss with charge nurse and notify provider. Consider notifying RRT. If remains red for 2 hours consider need for higher level of care  Notify: Charge Nurse/RN  Name of Charge Nurse/RN Notified Tiffany, RN  Provider Notification  Provider Name/Title Manuela Schwartz  Date Provider Notified 05/09/23  Time Provider Notified 0403  Method of Notification Page  Notification Reason Other (Comment) (mews red)  Provider response Evaluate remotely  Date of Provider Response 05/09/23  Time of Provider Response 0406  Assess: SIRS CRITERIA  SIRS Temperature  1  SIRS Pulse 1  SIRS Respirations  0  SIRS WBC 0  SIRS Score Sum  2

## 2023-05-10 DIAGNOSIS — E861 Hypovolemia: Secondary | ICD-10-CM | POA: Diagnosis not present

## 2023-05-10 DIAGNOSIS — R531 Weakness: Secondary | ICD-10-CM | POA: Diagnosis not present

## 2023-05-10 DIAGNOSIS — N170 Acute kidney failure with tubular necrosis: Secondary | ICD-10-CM | POA: Diagnosis not present

## 2023-05-10 DIAGNOSIS — E039 Hypothyroidism, unspecified: Secondary | ICD-10-CM | POA: Diagnosis not present

## 2023-05-10 LAB — BLOOD CULTURE ID PANEL (REFLEXED) - BCID2

## 2023-05-10 LAB — BASIC METABOLIC PANEL
Anion gap: 9 (ref 5–15)
BUN: 62 mg/dL — ABNORMAL HIGH (ref 6–20)
CO2: 17 mmol/L — ABNORMAL LOW (ref 22–32)
Calcium: 8.1 mg/dL — ABNORMAL LOW (ref 8.9–10.3)
Chloride: 110 mmol/L (ref 98–111)
Creatinine, Ser: 3.03 mg/dL — ABNORMAL HIGH (ref 0.61–1.24)
GFR, Estimated: 25 mL/min — ABNORMAL LOW (ref >60)
Glucose, Bld: 104 mg/dL — ABNORMAL HIGH (ref 70–99)
Potassium: 3.5 mmol/L (ref 3.5–5.1)
Sodium: 136 mmol/L (ref 135–145)

## 2023-05-10 LAB — CBC WITH DIFFERENTIAL/PLATELET
Abs Immature Granulocytes: 0.07 10*3/uL (ref 0.00–0.07)
Basophils Absolute: 0 10*3/uL (ref 0.0–0.1)
Basophils Relative: 0 %
Eosinophils Absolute: 0.2 10*3/uL (ref 0.0–0.5)
Eosinophils Relative: 1 %
HCT: 19.6 % — ABNORMAL LOW (ref 39.0–52.0)
Hemoglobin: 6.5 g/dL — ABNORMAL LOW (ref 13.0–17.0)
Immature Granulocytes: 0 %
Lymphocytes Relative: 4 %
Lymphs Abs: 0.7 10*3/uL (ref 0.7–4.0)
MCH: 28.8 pg (ref 26.0–34.0)
MCHC: 33.2 g/dL (ref 30.0–36.0)
MCV: 86.7 fL (ref 80.0–100.0)
Monocytes Absolute: 0.5 10*3/uL (ref 0.1–1.0)
Monocytes Relative: 3 %
Neutro Abs: 14.4 10*3/uL — ABNORMAL HIGH (ref 1.7–7.7)
Neutrophils Relative %: 92 %
Platelets: 168 10*3/uL (ref 150–400)
RBC: 2.26 MIL/uL — ABNORMAL LOW (ref 4.22–5.81)
RDW: 20.4 % — ABNORMAL HIGH (ref 11.5–15.5)
WBC: 15.8 10*3/uL — ABNORMAL HIGH (ref 4.0–10.5)
nRBC: 0 % (ref 0.0–0.2)

## 2023-05-10 LAB — URINALYSIS, W/ REFLEX TO CULTURE (INFECTION SUSPECTED)
Bilirubin Urine: NEGATIVE
Glucose, UA: NEGATIVE mg/dL
Hgb urine dipstick: NEGATIVE
Ketones, ur: NEGATIVE mg/dL
Leukocytes,Ua: NEGATIVE
Nitrite: NEGATIVE
Protein, ur: NEGATIVE mg/dL
Specific Gravity, Urine: 1.01 (ref 1.005–1.030)
pH: 5 (ref 5.0–8.0)

## 2023-05-10 LAB — TYPE AND SCREEN: Unit division: 0

## 2023-05-10 LAB — PHOSPHORUS: Phosphorus: 3.5 mg/dL (ref 2.5–4.6)

## 2023-05-10 LAB — BPAM RBC: Blood Product Expiration Date: 202406142359

## 2023-05-10 LAB — PREPARE RBC (CROSSMATCH)

## 2023-05-10 LAB — GLUCOSE, CAPILLARY
Glucose-Capillary: 92 mg/dL (ref 70–99)
Glucose-Capillary: 91 mg/dL (ref 70–99)

## 2023-05-10 LAB — CULTURE, BLOOD (ROUTINE X 2): Special Requests: ADEQUATE

## 2023-05-10 LAB — HEMOGLOBIN AND HEMATOCRIT, BLOOD
HCT: 22.6 % — ABNORMAL LOW (ref 39.0–52.0)
Hemoglobin: 7.6 g/dL — ABNORMAL LOW (ref 13.0–17.0)

## 2023-05-10 MED ORDER — VANCOMYCIN VARIABLE DOSE PER UNSTABLE RENAL FUNCTION (PHARMACIST DOSING): Status: DC

## 2023-05-10 MED ORDER — SODIUM CHLORIDE 0.9% IV SOLUTION: Freq: Once | INTRAVENOUS | Status: AC

## 2023-05-10 MED ORDER — SODIUM CHLORIDE 0.9 % IV SOLN
2.0000 g | Freq: Two times a day (BID) | INTRAVENOUS | Status: DC
  Administered 2023-05-10 – 2023-05-12 (×5): 2 g via INTRAVENOUS
  Filled 2023-05-10: qty 12.5
  Filled 2023-05-10 (×3): qty 2
  Filled 2023-05-10: qty 12.5
  Filled 2023-05-10: qty 2

## 2023-05-10 MED ORDER — DIPHENHYDRAMINE HCL 50 MG/ML IJ SOLN
25.0000 mg | Freq: Once | INTRAMUSCULAR | Status: AC
  Administered 2023-05-10: 25 mg via INTRAVENOUS

## 2023-05-10 MED ORDER — DIPHENHYDRAMINE HCL 50 MG/ML IJ SOLN
INTRAMUSCULAR | Status: AC
  Administered 2023-05-10: 25 mg via INTRAVENOUS
  Filled 2023-05-10: qty 1

## 2023-05-10 NOTE — Progress Notes (Signed)
Pharmacy Antibiotic Note  Christopher Herman is a 47 y.o. male admitted on 04/21/2023 with bacteremia.  Pharmacy has been consulted for Cefepime dosing.  Plan: Cefepime 2 gm IV Q12H ordered to start on 5/12 @ 0500.   Height: 5\' 5"  (165.1 cm) Weight: (!) 145.2 kg (320 lb) IBW/kg (Calculated) : 61.5  Temp (24hrs), Avg:98.3 F (36.8 C), Min:97.8 F (36.6 C), Max:98.9 F (37.2 C)  Recent Labs  Lab 05/05/23 0550 05/06/23 0535 05/07/23 0500 05/08/23 0800 05/09/23 0612 05/09/23 0700 05/09/23 0755 05/09/23 1526  WBC 16.1* 13.7* 12.4* 11.3* 29.8*  --   --   --   CREATININE 2.64* 2.67* 2.74* 3.13* 3.03*  --   --   --   LATICACIDVEN  --   --   --   --   --  0.8 0.7  --   VANCORANDOM  --   --   --   --   --   --   --  13    Estimated Creatinine Clearance: 41.4 mL/min (A) (by C-G formula based on SCr of 3.03 mg/dL (H)).    No Known Allergies  Antimicrobials this admission:   >>    >>   Dose adjustments this admission:   Microbiology results:  BCx:   UCx:    Sputum:    MRSA PCR:   Thank you for allowing pharmacy to be a part of this patient's care.  Daequan Kozma D 05/10/2023 4:40 AM

## 2023-05-10 NOTE — Progress Notes (Addendum)
PICC consult: Confirmed with Molli Hazard, RN current access remains patent, informed him PICC will not be placed today. VAST will follow up on 5/13.   OK to insert PICC from nephrology standpoint, per Dr. Suezanne Jacquet.

## 2023-05-10 NOTE — Progress Notes (Signed)
[facility]  PROGRESS NOTE   Subjective:   Patient is awake and alert.  Objective:  Vital signs: Blood pressure 112/72, pulse (!) 116, temperature 98.8 F (37.1 C), resp. rate 17, height 5\' 5"  (1.651 m), weight (!) 145.2 kg, SpO2 98 %.  Intake/Output Summary (Last 24 hours) at 05/10/2023 1328 Last data filed at 05/10/2023 1048 Gross per 24 hour  Intake 1030.66 ml  Output 600 ml  Net 430.66 ml   Filed Weights   05/07/23 1116 05/08/23 0702 05/08/23 0825  Weight: (!) 142.7 kg (!) 145.6 kg (!) 145.2 kg     Physical Exam: General:  No acute distress  Head:  Normocephalic, atraumatic. Moist oral mucosal membranes  Eyes:  Anicteric  Neck:  Supple  Lungs:   Clear to auscultation, normal effort  Heart:  S1S2 no rubs  Abdomen:   Soft, nontender, bowel sounds present  Extremities: 2+ peripheral edema.  Neurologic:  Awake, alert, following commands  Skin:  No lesions  Access:     Basic Metabolic Panel: Recent Labs  Lab 05/06/23 0535 05/07/23 0500 05/08/23 0800 05/09/23 0612 05/10/23 0546  NA 135 134* 134* 134* 136  K 3.9 3.6 3.4* 3.4* 3.5  CL 111 109 110 111 110  CO2 16* 14* 15* 16* 17*  GLUCOSE 84 89 96 115* 104*  BUN 65* 65* 63* 63* 62*  CREATININE 2.67* 2.74* 3.13* 3.03* 3.03*  CALCIUM 8.2* 8.4* 8.1* 8.1* 8.1*  PHOS 3.5 3.9 4.1 3.3 3.5   GFR: Estimated Creatinine Clearance: 41.4 mL/min (A) (by C-G formula based on SCr of 3.03 mg/dL (H)).  Liver Function Tests: No results for input(s): "AST", "ALT", "ALKPHOS", "BILITOT", "PROT", "ALBUMIN" in the last 168 hours. No results for input(s): "LIPASE", "AMYLASE" in the last 168 hours. No results for input(s): "AMMONIA" in the last 168 hours.  CBC: Recent Labs  Lab 05/06/23 0535 05/07/23 0500 05/08/23 0800 05/09/23 0612 05/10/23 0546  WBC 13.7* 12.4* 11.3* 29.8* 15.8*  NEUTROABS 11.4* 10.1* 9.6* 28.7* 14.4*  HGB 7.1* 8.3* 7.7* 7.3* 6.5*  HCT 21.8* 25.3* 23.8* 22.7* 19.6*  MCV 86.2 86.3 85.9 87.3  86.7  PLT 262 237 239 197 168     HbA1C: Hemoglobin A1C  Date/Time Value Ref Range Status  02/11/2012 12:27 PM 5.8 4.2 - 6.3 % Final    Comment:    The American Diabetes Association recommends that a primary goal of therapy should be <7% and that physicians should reevaluate the treatment regimen in patients with HbA1c values consistently >8%.     Urinalysis: No results for input(s): "COLORURINE", "LABSPEC", "PHURINE", "GLUCOSEU", "HGBUR", "BILIRUBINUR", "KETONESUR", "PROTEINUR", "UROBILINOGEN", "NITRITE", "LEUKOCYTESUR" in the last 72 hours.  Invalid input(s): "APPERANCEUR"    Imaging: [place] EKG SITE RITE  Result Date: 05/08/2023 If Site Rite image not attached, placement could not be confirmed due to current cardiac rhythm.    Medications:    albumin human 25 g (05/10/23 0927)   ceFEPime (MAXIPIME) IV 2 g (05/10/23 0534)   sodium bicarbonate 150 mEq in sterile water 1,150 mL infusion 50 mL/hr at 05/09/23 1535    (feeding supplement) PROSource Plus  30 mL Oral TID BM   vitamin C  500 mg Oral BID   Chlorhexidine Gluconate Cloth  6 each Topical Daily   copper  8 mg Oral Daily   feeding supplement (KATE FARMS STANDARD 1.4)  325 mL Oral TID BM   folic acid  1 mg Oral Daily   insulin aspart  0-15 Units Subcutaneous  TID WC   lactase  6,000 Units Oral TID WC   levothyroxine  100 mcg Oral Q0600   lipase/protease/amylase  24,000 Units Oral TID AC   metoprolol succinate  12.5 mg Oral Daily   mirtazapine  15 mg Oral QHS   multivitamin with minerals  1 tablet Oral BID   pantoprazole  40 mg Oral QHS   rifaximin  550 mg Oral TID   Rivaroxaban  15 mg Oral BID WC   Followed by   Melene Muller ON 05/30/2023] rivaroxaban  20 mg Oral Q supper   thiamine  100 mg Oral Daily   vitamin A  10,000 Units Oral Daily   Vitamin D (Ergocalciferol)  50,000 Units Oral Q7 days   Vitamin E  400 Units Oral Daily   zinc sulfate  220 mg Oral Daily    Assessment/ Plan:     47 year old male with history  of morbid obesity, hypertension, obstructive sleep apnea, lymphedema, diabetes and chronic kidney disease number admitted with history of nonhealing ulcers and possible sepsis.   #1: Acute kidney injury: Patient has acute kidney injury on chronic kidney disease.  Renal indices are worsening.  This is probably secondary to overall decline in status.  Possibly secondary to septic ATN also.   #2: Hypertension: Continue metoprolol.   #3: Congestive heart failure: Will continue to monitor closely.   #4: Hyponatremia: This may be secondary to dilutional causes.   #5: Metabolic acidosis: Metabolic acidosis secondary to sepsis.  Will continue bicarb supplements.  #6: Anemia: Patient has worsening anemia and is receiving blood transfusion today.  May need to be started on Epogen and iron.   Will follow.    LOS: 19 Lorain Childes, MD [facility] 5/12/20241:28 PM

## 2023-05-10 NOTE — Progress Notes (Signed)
PHARMACY - PHYSICIAN COMMUNICATION CRITICAL VALUE ALERT - BLOOD CULTURE IDENTIFICATION (BCID)  Christopher Herman is an 47 y.o. male who presented to [facility] on 04/21/2023 with a chief complaint of severe hypothyroidism.   Assessment: Pseudomonas growing in 1 of 4 bottles, (aerobic).   Staph epi also in previous BCID , Mec A detected.   (include suspected source if known)  Name of physician (or Provider) Contacted: Manuela Schwartz, NP   Current antibiotics: Ceftriaxone,  Vancomycin   Changes to prescribed antibiotics recommended:  Change ceftriaxone to cefepime 2 gm IV Q12H.   Continue Vanc.   Results for orders placed or performed during the hospital encounter of 04/21/23  Blood Culture ID Panel (Reflexed) (Collected: 04/21/2023  2:36 AM)  Result Value Ref Range   Enterococcus faecalis NOT DETECTED NOT DETECTED   Enterococcus Faecium NOT DETECTED NOT DETECTED   Listeria monocytogenes NOT DETECTED NOT DETECTED   Staphylococcus species DETECTED (A) NOT DETECTED   Staphylococcus aureus (BCID) NOT DETECTED NOT DETECTED   Staphylococcus epidermidis DETECTED (A) NOT DETECTED   Staphylococcus lugdunensis NOT DETECTED NOT DETECTED   Streptococcus species NOT DETECTED NOT DETECTED   Streptococcus agalactiae NOT DETECTED NOT DETECTED   Streptococcus pneumoniae NOT DETECTED NOT DETECTED   Streptococcus pyogenes NOT DETECTED NOT DETECTED   A.calcoaceticus-baumannii NOT DETECTED NOT DETECTED   Bacteroides fragilis NOT DETECTED NOT DETECTED   Enterobacterales NOT DETECTED NOT DETECTED   Enterobacter cloacae complex NOT DETECTED NOT DETECTED   Escherichia coli NOT DETECTED NOT DETECTED   Klebsiella aerogenes NOT DETECTED NOT DETECTED   Klebsiella oxytoca NOT DETECTED NOT DETECTED   Klebsiella pneumoniae NOT DETECTED NOT DETECTED   Proteus species NOT DETECTED NOT DETECTED   Salmonella species NOT DETECTED NOT DETECTED   Serratia marcescens NOT DETECTED NOT DETECTED   Haemophilus influenzae  NOT DETECTED NOT DETECTED   Neisseria meningitidis NOT DETECTED NOT DETECTED   Pseudomonas aeruginosa NOT DETECTED NOT DETECTED   Stenotrophomonas maltophilia NOT DETECTED NOT DETECTED   Candida albicans NOT DETECTED NOT DETECTED   Candida auris NOT DETECTED NOT DETECTED   Candida glabrata NOT DETECTED NOT DETECTED   Candida krusei NOT DETECTED NOT DETECTED   Candida parapsilosis NOT DETECTED NOT DETECTED   Candida tropicalis NOT DETECTED NOT DETECTED   Cryptococcus neoformans/gattii NOT DETECTED NOT DETECTED   Methicillin resistance mecA/C DETECTED (A) NOT DETECTED    Raissa Dam D 05/10/2023  4:30 AM

## 2023-05-10 NOTE — Progress Notes (Signed)
PROGRESS NOTE  Christopher Herman  VFI:[id number] DOB: [birth year] DOA: 04/21/2023 PCP: Pcp, No   Brief Narrative: Patient is a 47 y.o. male with a PMH significant for severe obesity s/p bariatric surgery, OSA, hypoventilation syndrome, HTN, GERD, cholelithiasis, bilateral extremity, chronic lymphedema ,status post IVC filter placement, IDA who presented from home to the ED on 04/21/2023 with generalized weakness, extreme fatigue, unintended weight loss, disuse of upper extremities and poor p.o. intake.Marland Kitchen He is chronically bed bound and malnourished.  Initially septic shock was suspected, admitted to ICU, started on broad-spectrum antibiotics.  Also required vasopressors.  Vasopressors have been discontinued.  Currently blood pressure stable.  Hospital course remarkable for worsening kidney function.  Nephrology following, suspected to have ATN.  PT/OT recommending SNF/long-term care.  TOC following.  Palliative care was also consulted for goals of care.  Remains full code.  5/11: Overnight developed fever at 101.  Repeat blood cultures were sent.  Intermittent diarrhea with no acute change.  Worsening leukocytosis.  Prior blood cultures with Staphylococcus reticularis, resistant to oxacillin.  Restarting vancomycin while waiting for blood culture results. Lower extremity venous Doppler done on 5/8 was positive for significant left lower extremity DVT-starting him on Xarelto. If developed any respiratory compromise then will need VQ scan to rule out PE.  5/12: Hemodynamically stable and afebrile.  Preliminary blood culture with Pseudomonas-vancomycin was discontinued and ceftriaxone switched with cefepime.  Hemoglobin decreased to 6.5, no obvious bleeding.  Ordered 1 unit of PRBC.  Patient was started on Xarelto yesterday.  Renal function stable.  Assessment & Plan:  Principal Problem:   Severe hypothyroidism Active Problems:   Malnutrition of moderate degree (HCC)   Generalized weakness   Ulcer of  right ankle (HCC)   Hypotension due to hypovolemia   Acute renal failure due to tubular necrosis (HCC)   Hypothyroidism  Septic shock: Presented with severe hypotension.  Admitted for septic shock.  Initially required ICU care, given vasopressors.  Blood cultures showed staph auricularis, capitis.  Completed antibiotics course.  Blood pressure has been stable.  Currently on gentle bicarb drip, IV albumin  5/11: Patient became febrile again overnight.  Repeat blood cultures were sent and also restarting vancomycin. Patient still has central line in place due to unable to get a peripheral line.   5/12: Preliminary cultures with Pseudomonas.  Ceftriaxone switched to cefepime and vancomycin was discontinued.  Left lower extremity DVT.  Lower extremity venous Doppler was obtained on 5/8 which shows pretty significant left lower extremity DVT. -Start him on Xarelto -Monitor for any respiratory compromise-if developed any shortness of breath then will need VQ scan  AKI: Worsening kidney function during this hospitalization.  Renal ultrasound negative for hydronephrosis or obstruction.  Suspected from ATN.  Serological workup has been negative so far.  Adequate urine output.  Nephrology following.  No indication for dialysis.  Creatinine currently in the range of 3  Chronic combined systolic /diastolic CHF: Echo done on 4/24 showed EF of 35 to 40%, global hypokinesis, grade 1 diastolic dysfunction.  Currently he looks euvolemic  Hypokalemia: Improved.  Continue to monitor and replete as needed.  Severe hypothyroidism: Given IV Synthyroid.  Currently on oral Synthyroid.  Follow-up thyroid function test in 4 to 6 weeks.  Leukocytosis/thrombocytosis: Worsening leukocytosis today.  Anemia of chronic disease: Got 2 units of blood transfusion during his hospitalization.  Continue monitoring.  Hemoglobin currently in the range of 7 -Ordered another unit of PRBC as hemoglobin was 6.5  Chronic sinus  tachycardia:  Chronic problem.  QTc also prolonged.  Avoid QTc prolonging medications.  On low-dose metoprolol  Depression: Continue Remeron  Chronic abdomen pain/diarrhea :He complains of chronic abdominal pain and fear of eating as it precipitates diarrhea.  RD has been seeing him and he is on several agents both to help with appetite and hopefully decrease some of his diarrhea and bloating sensations.  No acute change as he will remain high risk for C. Difficile.  Morbid obesity/bedbound status: PT/OT recommending.  Long-term facility.  TOC following.  Goals of care: Morbidly obese patient with multiple medical problems.  History of bariatric surgery, OSA, obesity hypoventilation syndrome, chronic lymphedema, status post IVC filter placement. bedbound status.  Palliative care was following for goals of care, remains full code   Nutrition Problem: Moderate Malnutrition Etiology: chronic illness (h/o roux-en-y gastric bypass) Pressure Injury 04/21/23 Ankle Right Stage 2 -  Partial thickness loss of dermis presenting as a shallow open injury with a red, pink wound bed without slough. (Active)  04/21/23 0600  Location: Ankle  Location Orientation: Right  Staging: Stage 2 -  Partial thickness loss of dermis presenting as a shallow open injury with a red, pink wound bed without slough.  Wound Description (Comments):   Present on Admission: Yes  Dressing Type Foam - Lift dressing to assess site every shift 05/09/23 2000    DVT prophylaxis: Rivaroxaban (XARELTO) tablet 15 mg  rivaroxaban (XARELTO) tablet 20 mg     Code Status: Full Code  Family Communication: None at bedside  Patient status:Inpatient  Patient is from :Home  Anticipated discharge ZO:XWRU vs SNF  Estimated DC date:not sure   Consultants: Nephrology, PCCM   Antimicrobials:  Anti-infectives (From admission, onward)    Start     Dose/Rate Route Frequency Ordered Stop   05/10/23 0600  vancomycin variable dose per  unstable renal function (pharmacist dosing)  Status:  Discontinued         Does not apply See admin instructions 05/09/23 1518 05/10/23 0423   05/10/23 0520  vancomycin variable dose per unstable renal function (pharmacist dosing)  Status:  Discontinued         Does not apply See admin instructions 05/10/23 0520 05/10/23 1205   05/10/23 0515  ceFEPIme (MAXIPIME) 2 g in sodium chloride 0.9 % 100 mL IVPB        2 g 200 mL/hr over 30 Minutes Intravenous Every 12 hours 05/10/23 0424     05/09/23 1830  vancomycin (VANCOREADY) IVPB 500 mg/100 mL  Status:  Discontinued        500 mg 100 mL/hr over 60 Minutes Intravenous  Once 05/09/23 1517 05/09/23 1524   05/09/23 1800  cefTRIAXone (ROCEPHIN) 2 g in sodium chloride 0.9 % 100 mL IVPB  Status:  Discontinued        2 g 200 mL/hr over 30 Minutes Intravenous Every 24 hours 05/09/23 1654 05/10/23 0423   05/09/23 1715  vancomycin (VANCOREADY) IVPB 1250 mg/250 mL        1,250 mg 166.7 mL/hr over 90 Minutes Intravenous  Once 05/09/23 1623 05/09/23 1839   05/09/23 1615  vancomycin (VANCOREADY) IVPB 2000 mg/400 mL  Status:  Discontinued        2,000 mg 200 mL/hr over 120 Minutes Intravenous  Once 05/09/23 1517 05/09/23 1524   04/29/23 1200  rifaximin (XIFAXAN) tablet 550 mg        550 mg Oral 3 times daily 04/29/23 1111 05/13/23 0959   04/24/23 1210  vancomycin variable dose  per unstable renal function (pharmacist dosing)  Status:  Discontinued         Does not apply See admin instructions 04/24/23 1210 05/01/23 1025   04/22/23 2200  vancomycin (VANCOREADY) IVPB 1750 mg/350 mL  Status:  Discontinued        1,750 mg 175 mL/hr over 120 Minutes Intravenous Every 12 hours 04/22/23 1141 04/24/23 1209   04/22/23 1800  cefTRIAXone (ROCEPHIN) 2 g in sodium chloride 0.9 % 100 mL IVPB  Status:  Discontinued        2 g 200 mL/hr over 30 Minutes Intravenous Daily 04/22/23 1201 04/24/23 1739   04/22/23 1230  vancomycin (VANCOREADY) IVPB 2000 mg/400 mL        2,000  mg 200 mL/hr over 120 Minutes Intravenous  Once 04/22/23 1141 04/22/23 1533   04/21/23 1500  vancomycin (VANCOREADY) IVPB 2000 mg/400 mL  Status:  Discontinued        2,000 mg 200 mL/hr over 120 Minutes Intravenous Every 12 hours 04/21/23 0645 04/21/23 1253   04/21/23 1400  linezolid (ZYVOX) IVPB 600 mg  Status:  Discontinued        600 mg 300 mL/hr over 60 Minutes Intravenous Every 12 hours 04/21/23 1253 04/22/23 1130   04/21/23 0730  piperacillin-tazobactam (ZOSYN) IVPB 3.375 g        3.375 g 12.5 mL/hr over 240 Minutes Intravenous Every 8 hours 04/21/23 0638 04/22/23 1328   04/21/23 0215  ceFEPIme (MAXIPIME) 2 g in sodium chloride 0.9 % 100 mL IVPB        2 g 200 mL/hr over 30 Minutes Intravenous  Once 04/21/23 0204 04/21/23 0333   04/21/23 0215  vancomycin (VANCOCIN) IVPB 1000 mg/200 mL premix       See Hyperspace for full Linked Orders Report.   1,000 mg 200 mL/hr over 60 Minutes Intravenous  Once 04/21/23 0211 04/21/23 0420   04/21/23 0215  vancomycin (VANCOREADY) IVPB 1500 mg/300 mL       See Hyperspace for full Linked Orders Report.   1,500 mg 150 mL/hr over 120 Minutes Intravenous  Once 04/21/23 0211 04/21/23 0706       Subjective: Patient was seen and examined today.  Denies any obvious bleeding.  No new concern.  Objective: Vitals:   05/10/23 1000 05/10/23 1100 05/10/23 1225 05/10/23 1240  BP:  107/77 (!) 99/50 112/72  Pulse:   (!) 115 (!) 116  Resp: 19 17 16 17   Temp:  97.9 F (36.6 C) 98.9 F (37.2 C) 98.8 F (37.1 C)  TempSrc:      SpO2:  98%    Weight:      Height:        Intake/Output Summary (Last 24 hours) at 05/10/2023 1309 Last data filed at 05/10/2023 1048 Gross per 24 hour  Intake 1030.66 ml  Output 600 ml  Net 430.66 ml    Filed Weights   05/07/23 1116 05/08/23 0702 05/08/23 0825  Weight: (!) 142.7 kg (!) 145.6 kg (!) 145.2 kg    Examination:  General.  Morbidly obese gentleman, in no acute distress. Pulmonary.  Lungs clear  bilaterally, normal respiratory effort. CV.  Regular rate and rhythm, no JVD, rub or murmur. Abdomen.  Soft, nontender, nondistended, BS positive. CNS.  Alert and oriented .  No focal neurologic deficit. Extremities.  Bilateral LE lymphedema Psychiatry.  Judgment and insight appears normal.     Data Reviewed: I have personally reviewed following labs and imaging studies  CBC: Recent Labs  Lab  05/06/23 0535 05/07/23 0500 05/08/23 0800 05/09/23 0612 05/10/23 0546  WBC 13.7* 12.4* 11.3* 29.8* 15.8*  NEUTROABS 11.4* 10.1* 9.6* 28.7* 14.4*  HGB 7.1* 8.3* 7.7* 7.3* 6.5*  HCT 21.8* 25.3* 23.8* 22.7* 19.6*  MCV 86.2 86.3 85.9 87.3 86.7  PLT 262 237 239 197 168    Basic Metabolic Panel: Recent Labs  Lab 05/06/23 0535 05/07/23 0500 05/08/23 0800 05/09/23 0612 05/10/23 0546  NA 135 134* 134* 134* 136  K 3.9 3.6 3.4* 3.4* 3.5  CL 111 109 110 111 110  CO2 16* 14* 15* 16* 17*  GLUCOSE 84 89 96 115* 104*  BUN 65* 65* 63* 63* 62*  CREATININE 2.67* 2.74* 3.13* 3.03* 3.03*  CALCIUM 8.2* 8.4* 8.1* 8.1* 8.1*  PHOS 3.5 3.9 4.1 3.3 3.5      Recent Results (from the past 240 hour(s))  Culture, blood (Routine X 2) w Reflex to ID Panel     Status: None (Preliminary result)   Collection Time: 05/09/23  9:09 AM   Specimen: BLOOD  Result Value Ref Range Status   Specimen Description   Final    BLOOD A-LINE Performed at [facility], [address] [place], [place] [address]    Special Requests   Final    BOTTLES DRAWN AEROBIC AND ANAEROBIC AEROMONAS CAVIAE   Culture  Setup Time   Final    Organism ID to follow GRAM NEGATIVE RODS AEROBIC BOTTLE ONLY CRITICAL RESULT CALLED TO, READ BACK BY AND VERIFIED WITH: JASON ROBINS @0420  05/10/23 [facility] Performed at [facility], [address] [place], [place] [address]    Culture GRAM NEGATIVE RODS  Final   Report Status PENDING  Incomplete  Culture, blood (Routine X 2) w Reflex to ID Panel     Status: None (Preliminary  result)   Collection Time: 05/09/23  9:09 AM   Specimen: BLOOD LEFT ARM  Result Value Ref Range Status   Specimen Description BLOOD LEFT ARM  Final   Special Requests   Final    BOTTLES DRAWN AEROBIC ONLY Blood Culture adequate volume   Culture   Final    NO GROWTH < 24 HOURS Performed at [facility], [address] [place], [place] [address]    Report Status PENDING  Incomplete  Blood Culture ID Panel (Reflexed)     Status: Abnormal   Collection Time: 05/09/23  9:09 AM  Result Value Ref Range Status   Enterococcus faecalis NOT DETECTED NOT DETECTED Final   Enterococcus Faecium NOT DETECTED NOT DETECTED Final   Listeria monocytogenes NOT DETECTED NOT DETECTED Final   Staphylococcus species NOT DETECTED NOT DETECTED Final   Staphylococcus aureus (BCID) NOT DETECTED NOT DETECTED Final   Staphylococcus epidermidis NOT DETECTED NOT DETECTED Final   Staphylococcus lugdunensis NOT DETECTED NOT DETECTED Final   Streptococcus species NOT DETECTED NOT DETECTED Final   Streptococcus agalactiae NOT DETECTED NOT DETECTED Final   Streptococcus pneumoniae NOT DETECTED NOT DETECTED Final   Streptococcus pyogenes NOT DETECTED NOT DETECTED Final   A.calcoaceticus-baumannii NOT DETECTED NOT DETECTED Final   Bacteroides fragilis NOT DETECTED NOT DETECTED Final   Enterobacterales NOT DETECTED NOT DETECTED Final   Enterobacter cloacae complex NOT DETECTED NOT DETECTED Final   Escherichia coli NOT DETECTED NOT DETECTED Final   Klebsiella aerogenes NOT DETECTED NOT DETECTED Final   Klebsiella oxytoca NOT DETECTED NOT DETECTED Final   Klebsiella pneumoniae NOT DETECTED NOT DETECTED Final   Proteus species NOT DETECTED NOT DETECTED Final  Salmonella species NOT DETECTED NOT DETECTED Final   Serratia marcescens NOT DETECTED NOT DETECTED Final   Haemophilus influenzae NOT DETECTED NOT DETECTED Final   Neisseria meningitidis NOT DETECTED NOT DETECTED Final   Pseudomonas aeruginosa DETECTED  (A) NOT DETECTED Final    Comment: CRITICAL RESULT CALLED TO, READ BACK BY AND VERIFIED WITH: JASON ROBINS @ 0420 05/10/23 [facility]GH    Stenotrophomonas maltophilia NOT DETECTED NOT DETECTED Final   Candida albicans NOT DETECTED NOT DETECTED Final   Candida auris NOT DETECTED NOT DETECTED Final   Candida glabrata NOT DETECTED NOT DETECTED Final   Candida krusei NOT DETECTED NOT DETECTED Final   Candida parapsilosis NOT DETECTED NOT DETECTED Final   Candida tropicalis NOT DETECTED NOT DETECTED Final   Cryptococcus neoformans/gattii NOT DETECTED NOT DETECTED Final   CTX-M ESBL NOT DETECTED NOT DETECTED Final   Carbapenem resistance IMP NOT DETECTED NOT DETECTED Final   Carbapenem resistance KPC NOT DETECTED NOT DETECTED Final   Carbapenem resistance NDM NOT DETECTED NOT DETECTED Final   Carbapenem resistance VIM NOT DETECTED NOT DETECTED Final    Comment: Performed at [facility], [address], [place], [place] [address]     Radiology Studies: [place] EKG SITE RITE  Result Date: 05/08/2023 If Site Rite image not attached, placement could not be confirmed due to current cardiac rhythm.   Scheduled Meds:  (feeding supplement) PROSource Plus  30 mL Oral TID BM   vitamin C  500 mg Oral BID   Chlorhexidine Gluconate Cloth  6 each Topical Daily   copper  8 mg Oral Daily   feeding supplement (KATE FARMS STANDARD 1.4)  325 mL Oral TID BM   folic acid  1 mg Oral Daily   insulin aspart  0-15 Units Subcutaneous TID WC   lactase  6,000 Units Oral TID WC   levothyroxine  100 mcg Oral Q0600   lipase/protease/amylase  24,000 Units Oral TID AC   metoprolol succinate  12.5 mg Oral Daily   mirtazapine  15 mg Oral QHS   multivitamin with minerals  1 tablet Oral BID   pantoprazole  40 mg Oral QHS   rifaximin  550 mg Oral TID   Rivaroxaban  15 mg Oral BID WC   Followed by   Melene Muller ON 05/30/2023] rivaroxaban  20 mg Oral Q supper   thiamine  100 mg Oral Daily   vitamin A  10,000 Units Oral Daily    Vitamin D (Ergocalciferol)  50,000 Units Oral Q7 days   Vitamin E  400 Units Oral Daily   zinc sulfate  220 mg Oral Daily   Continuous Infusions:  albumin human 25 g (05/10/23 0927)   ceFEPime (MAXIPIME) IV 2 g (05/10/23 0534)   sodium bicarbonate 150 mEq in sterile water 1,150 mL infusion 50 mL/hr at 05/09/23 1535     LOS: 19 days   Arnetha Courser, MD [facility] P5/11/2023, 1:09 PM

## 2023-05-11 ENCOUNTER — Telehealth (HOSPITAL_COMMUNITY): Payer: Self-pay | Admitting: Pharmacy Technician

## 2023-05-11 ENCOUNTER — Other Ambulatory Visit (HOSPITAL_COMMUNITY): Payer: Self-pay

## 2023-05-11 DIAGNOSIS — E861 Hypovolemia: Secondary | ICD-10-CM | POA: Diagnosis not present

## 2023-05-11 DIAGNOSIS — T07XXXA Unspecified multiple injuries, initial encounter: Secondary | ICD-10-CM | POA: Diagnosis not present

## 2023-05-11 DIAGNOSIS — R531 Weakness: Secondary | ICD-10-CM | POA: Diagnosis not present

## 2023-05-11 DIAGNOSIS — E039 Hypothyroidism, unspecified: Secondary | ICD-10-CM | POA: Diagnosis not present

## 2023-05-11 DIAGNOSIS — B965 Pseudomonas (aeruginosa) (mallei) (pseudomallei) as the cause of diseases classified elsewhere: Secondary | ICD-10-CM

## 2023-05-11 DIAGNOSIS — N170 Acute kidney failure with tubular necrosis: Secondary | ICD-10-CM | POA: Diagnosis not present

## 2023-05-11 DIAGNOSIS — R7881 Bacteremia: Secondary | ICD-10-CM | POA: Diagnosis not present

## 2023-05-11 LAB — CBC WITH DIFFERENTIAL/PLATELET
Abs Immature Granulocytes: 0.26 10*3/uL — ABNORMAL HIGH (ref 0.00–0.07)
Basophils Absolute: 0.1 10*3/uL (ref 0.0–0.1)
Basophils Relative: 1 %
Eosinophils Absolute: 0.4 10*3/uL (ref 0.0–0.5)
Eosinophils Relative: 3 %
HCT: 23.5 % — ABNORMAL LOW (ref 39.0–52.0)
Hemoglobin: 7.9 g/dL — ABNORMAL LOW (ref 13.0–17.0)
Immature Granulocytes: 2 %
Lymphocytes Relative: 6 %
Lymphs Abs: 0.8 10*3/uL (ref 0.7–4.0)
MCH: 28.5 pg (ref 26.0–34.0)
MCHC: 33.6 g/dL (ref 30.0–36.0)
MCV: 84.8 fL (ref 80.0–100.0)
Monocytes Absolute: 0.6 10*3/uL (ref 0.1–1.0)
Monocytes Relative: 5 %
Neutro Abs: 11.1 10*3/uL — ABNORMAL HIGH (ref 1.7–7.7)
Neutrophils Relative %: 83 %
Platelets: 207 10*3/uL (ref 150–400)
RBC: 2.77 MIL/uL — ABNORMAL LOW (ref 4.22–5.81)
RDW: 19.6 % — ABNORMAL HIGH (ref 11.5–15.5)
Smear Review: NORMAL
WBC: 13.2 10*3/uL — ABNORMAL HIGH (ref 4.0–10.5)
nRBC: 0.3 % — ABNORMAL HIGH (ref 0.0–0.2)

## 2023-05-11 LAB — BASIC METABOLIC PANEL
Anion gap: 9 (ref 5–15)
BUN: 61 mg/dL — ABNORMAL HIGH (ref 6–20)
CO2: 18 mmol/L — ABNORMAL LOW (ref 22–32)
Calcium: 8.2 mg/dL — ABNORMAL LOW (ref 8.9–10.3)
Chloride: 110 mmol/L (ref 98–111)
Creatinine, Ser: 3.05 mg/dL — ABNORMAL HIGH (ref 0.61–1.24)
GFR, Estimated: 25 mL/min — ABNORMAL LOW (ref >60)
Glucose, Bld: 89 mg/dL (ref 70–99)
Potassium: 3.4 mmol/L — ABNORMAL LOW (ref 3.5–5.1)
Sodium: 137 mmol/L (ref 135–145)

## 2023-05-11 LAB — GLUCOSE, CAPILLARY
Glucose-Capillary: 86 mg/dL (ref 70–99)
Glucose-Capillary: 83 mg/dL (ref 70–99)
Glucose-Capillary: 75 mg/dL (ref 70–99)

## 2023-05-11 LAB — CULTURE, BLOOD (ROUTINE X 2)

## 2023-05-11 LAB — BPAM RBC
ISSUE DATE / TIME: 202405121220
Unit Type and Rh: 5100

## 2023-05-11 LAB — TYPE AND SCREEN
ABO/RH(D): O POS
Antibody Screen: NEGATIVE

## 2023-05-11 LAB — AEROBIC CULTURE W GRAM STAIN (SUPERFICIAL SPECIMEN)

## 2023-05-11 LAB — PHOSPHORUS: Phosphorus: 3.7 mg/dL (ref 2.5–4.6)

## 2023-05-11 NOTE — Progress Notes (Signed)
[facility]  PROGRESS NOTE   Subjective:   More awake and alert.  PRBC transfusion yesterday.   Blood cultures with pseudomonas.   Objective:  Vital signs: Blood pressure 104/74, pulse (!) 110, temperature 98.4 F (36.9 C), temperature source Oral, resp. rate 16, height 5\' 5"  (1.651 m), weight (!) 169.4 kg, SpO2 98 %.  Intake/Output Summary (Last 24 hours) at 05/11/2023 1732 Last data filed at 05/11/2023 1045 Gross per 24 hour  Intake 840 ml  Output 750 ml  Net 90 ml    Filed Weights   05/08/23 0702 05/08/23 0825 05/11/23 0804  Weight: (!) 145.6 kg (!) 145.2 kg (!) 169.4 kg     Physical Exam: General:  No acute distress  Head:  Normocephalic, atraumatic. Moist oral mucosal membranes  Eyes:  Anicteric  Neck:  Supple  Lungs:   Clear to auscultation, normal effort  Heart:  S1S2 no rubs  Abdomen:   Soft, nontender, bowel sounds present  Extremities: 2+ peripheral edema.  Neurologic:  Awake, alert, following commands  Skin:  No lesions  :     Basic Metabolic Panel: Recent Labs  Lab 05/07/23 0500 05/08/23 0800 05/09/23 0612 05/10/23 0546 05/11/23 0641  NA 134* 134* 134* 136 137  K 3.6 3.4* 3.4* 3.5 3.4*  CL 109 110 111 110 110  CO2 14* 15* 16* 17* 18*  GLUCOSE 89 96 115* 104* 89  BUN 65* 63* 63* 62* 61*  CREATININE 2.74* 3.13* 3.03* 3.03* 3.05*  CALCIUM 8.4* 8.1* 8.1* 8.1* 8.2*  PHOS 3.9 4.1 3.3 3.5 3.7    GFR: Estimated Creatinine Clearance: 45.3 mL/min (A) (by C-G formula based on SCr of 3.05 mg/dL (H)).  Liver Function Tests: No results for input(s): "AST", "ALT", "ALKPHOS", "BILITOT", "PROT", "ALBUMIN" in the last 168 hours. No results for input(s): "LIPASE", "AMYLASE" in the last 168 hours. No results for input(s): "AMMONIA" in the last 168 hours.  CBC: Recent Labs  Lab 05/07/23 0500 05/08/23 0800 05/09/23 0612 05/10/23 0546 05/10/23 1004 05/11/23 0641  WBC 12.4* 11.3* 29.8* 15.8*  --  13.2*  NEUTROABS 10.1* 9.6* 28.7* 14.4*   --  11.1*  HGB 8.3* 7.7* 7.3* 6.5* 7.6* 7.9*  HCT 25.3* 23.8* 22.7* 19.6* 22.6* 23.5*  MCV 86.3 85.9 87.3 86.7  --  84.8  PLT 237 239 197 168  --  207      HbA1C: Hemoglobin A1C  Date/Time Value Ref Range Status  02/11/2012 12:27 PM 5.8 4.2 - 6.3 % Final    Comment:    The American Diabetes Association recommends that a primary goal of therapy should be <7% and that physicians should reevaluate the treatment regimen in patients with HbA1c values consistently >8%.     Urinalysis: Recent Labs    05/09/23 1730  COLORURINE YELLOW*  LABSPEC 1.010  PHURINE 5.0  GLUCOSEU NEGATIVE  HGBUR NEGATIVE  BILIRUBINUR NEGATIVE  KETONESUR NEGATIVE  PROTEINUR NEGATIVE  NITRITE NEGATIVE  LEUKOCYTESUR NEGATIVE      Imaging: No results found.   Medications:    ceFEPime (MAXIPIME) IV 2 g (05/11/23 0601)   sodium bicarbonate 150 mEq in sterile water 1,150 mL infusion 50 mL/hr at 05/10/23 2253    vitamin C  500 mg Oral BID   Chlorhexidine Gluconate Cloth  6 each Topical Daily   copper  8 mg Oral Daily   folic acid  1 mg Oral Daily   insulin aspart  0-15 Units Subcutaneous TID WC   lactase  6,000 Units Oral  TID WC   levothyroxine  100 mcg Oral Q0600   lipase/protease/amylase  24,000 Units Oral TID AC   metoprolol succinate  12.5 mg Oral Daily   mirtazapine  15 mg Oral QHS   multivitamin with minerals  1 tablet Oral BID   pantoprazole  40 mg Oral QHS   rifaximin  550 mg Oral TID   Rivaroxaban  15 mg Oral BID WC   Followed by   Melene Muller ON 05/30/2023] rivaroxaban  20 mg Oral Q supper   thiamine  100 mg Oral Daily   vitamin A  10,000 Units Oral Daily   Vitamin D (Ergocalciferol)  50,000 Units Oral Q7 days   Vitamin E  400 Units Oral Daily   zinc sulfate  220 mg Oral Daily    Assessment/ Plan:     47 year old male with history of morbid obesity, hypertension, obstructive sleep apnea, lymphedema, diabetes and chronic kidney disease number admitted with history of nonhealing ulcers  and possible sepsis.   #1: Acute kidney injury: Patient has acute kidney injury on chronic kidney disease.  Creatinine seems to have plateaued. No acute indication for dialysis.    #2: Hypertension: Continue metoprolol.   #3: Congestive heart failure: Will continue to monitor closely.   #4: Hyponatremia: improved.    #5: Metabolic acidosis:  continue bicarb supplements.  #6: Anemia: status post PRBC transfusion. Hemoglobin now 7.9. Continue to monitor.     LOS: 20 Lamont Dowdy, MD [facility] 5/13/20245:32 PM

## 2023-05-11 NOTE — Telephone Encounter (Signed)
Pharmacy Patient Advocate Encounter  Insurance verification completed.    The patient is insured through [employer]   The patient is currently admitted and ran test claims for the following: Xarelto .  Copays and coinsurance results were relayed to Inpatient clinical team.  

## 2023-05-11 NOTE — Progress Notes (Signed)
At bedside to place PICC and spoke with his primary nurse who informed me to hold off on PICC placement at this time until MD has evaluated  need for placement.

## 2023-05-11 NOTE — Progress Notes (Signed)
Explained benefits of ProSource protein liquid to patient in great detail and he said he would be willing to try it. He took a little sip and said, "No way. I will NOT be drinking those, don't bother bringing them in anymore." I notified dietician.

## 2023-05-11 NOTE — TOC Benefit Eligibility Note (Signed)
Patient Product/process development scientist completed.    The patient is currently admitted and upon discharge could be taking Xarelto 20 mg.  The current 30 day co-pay is $111.75.   The patient is insured through [place] [employer]   This test claim was processed through [employer] [facility]- copay amounts may vary at other pharmacies due to pharmacy/plan contracts, or as the patient moves through the different stages of their insurance plan.  Roland Earl, CPHT Pharmacy Patient Advocate Specialist [facility] Patient Advocate Team Direct Number: [phone]  Fax: [phone]

## 2023-05-11 NOTE — Consult Note (Addendum)
NAME: Christopher Herman  DOB: 04/28/1976  MRN: [id number]  Date/Time: 05/11/2023 11:48 AM  REQUESTING PROVIDER: Dr.Amin Subjective:  REASON FOR CONSULT: pseudomonas bacteremia ? Christopher Herman is a 47 y.o. male with a history of extreme obesity, s/p gastric bypass, lymphedema/lipedema legs, immobility had presented to ED on 04/21/23 with weakness , fatigue, weight loss  Found to have hyponaremia, hypothermia TSH was 9.134 and T4 1;34 He was diagnosed with severe hypothyroidism and given Iv synthroid followed by PO. He was also started on hydrocortisone for possible concomitant adrenal insufficiency- was admitted to ICU, treated as septic shock His blood culture had different coag neg staph Infectious disease physician Dr.Manandhar had seen him-- 4/23 staph capitis in both sets and staph epidermidis in 1/2 set. 4/25 GPC in 1/2 sets he was on vanco but because of very high trough level it was discontinued Level stayed therapeutic for 2 weeks  blood culture deemed to be contaminant-  4/30 repeat blood culture neg He had a Central venous cath placed on 04/21/23 He has baseline leucocytosis and it was 28 on 02/17/22 This admission it was as high as 44  on 04/23/23 In the past he was seen by oncologist during his in patient stay and labs like BCR-ABL, flow cytometry, CALR and MPL mutation testing were negative Cortisol level N and steroids were DC ID signed off on 04/30/23 On 5/10 he had a fever and wbc went up- the central line was removed and blood culture sent- 1/4 pseudomonas Started on cefepime I am seeing the patient for the same He as some weeping wounds  Past Medical History:  Diagnosis Date   AKI (acute kidney injury) (HCC)    Cholelithiasis    Chronic acquired lymphedema    GERD (gastroesophageal reflux disease)    Helicobacter pylori infection 09/10/2015   History of sepsis    Leg muscle spasm    Leukocytosis    Lipoma of both lower extremities    Palpitations 05/31/2009   Chronic,  stable   Presence of IVC filter    Rhabdomyolysis    Sleep apnea    Tachycardia    Vitamin D deficiency     Past Surgical History:  Procedure Laterality Date   GASTRIC BYPASS  05/23/2012    Social History   Socioeconomic History   Marital status: Single    Spouse name: Not on file   Number of children: 0   Years of education: Not on file   Highest education level: Not on file  Occupational History   Occupation: Employed    Comment: Works as a Ship broker  Tobacco Use   Smoking status: Former   Smokeless tobacco: Never   Tobacco comments:    Quit smoking in 2010, smoked cigarettes, smoked for about 16 years; smoked less than 1/2 pack per day.  Substance and Sexual Activity   Alcohol use: No   Drug use: No   Sexual activity: Not Currently    Birth control/protection: None  Other Topics Concern   Not on file  Social History Narrative   Not on file   Social Determinants of Health   Financial Resource Strain: Not on file  Food Insecurity: No Food Insecurity (11/04/2022)   Hunger Vital Sign    Worried About Running Out of Food in the Last Year: Never true    Ran Out of Food in the Last Year: Never true  Transportation Needs: No Transportation Needs (11/04/2022)   PRAPARE - Transportation    Lack of  Transportation (Medical): No    Lack of Transportation (Non-Medical): No  Physical Activity: Inactive (11/04/2022)   Exercise Vital Sign    Days of Exercise per Week: 0 days    Minutes of Exercise per Session: 0 min  Stress: Not on file  Social Connections: Not on file  Intimate Partner Violence: Not on file    Family History  Problem Relation Age of Onset   Cancer Paternal Grandmother    Cancer Paternal Grandfather    No Known Allergies I? Current Facility-Administered Medications  Medication Dose Route Frequency Provider Last Rate Last Admin   (feeding supplement) PROSource Plus liquid 30 mL  30 mL Oral TID BM Agbata, Tochukwu, MD   30 mL at 05/10/23 1755    acetaminophen (TYLENOL) tablet 650 mg  650 mg Oral Q6H PRN Leeroy Bock, MD   650 mg at 05/09/23 0418   ascorbic acid (VITAMIN C) tablet 500 mg  500 mg Oral BID Erin Fulling, MD   500 mg at 05/11/23 1020   bismuth subsalicylate (PEPTO BISMOL) 262 MG/15ML suspension 30 mL  30 mL Oral TID PRN Leeroy Bock, MD   30 mL at 05/09/23 1312   ceFEPIme (MAXIPIME) 2 g in sodium chloride 0.9 % 100 mL IVPB  2 g Intravenous Q12H Manuela Schwartz, NP 200 mL/hr at 05/11/23 0601 2 g at 05/11/23 0601   Chlorhexidine Gluconate Cloth 2 % PADS 6 each  6 each Topical Daily Erin Fulling, MD   6 each at 05/10/23 0930   copper tablet 8 mg  8 mg Oral Daily Jamelle Rushing L, MD   8 mg at 05/11/23 1021   folic acid (FOLVITE) tablet 1 mg  1 mg Oral Daily Kasa, Kurian, MD   1 mg at 05/11/23 1020   insulin aspart (novoLOG) injection 0-15 Units  0-15 Units Subcutaneous TID WC Leeroy Bock, MD   2 Units at 05/04/23 1714   lactase (LACTAID) tablet 6,000 Units  6,000 Units Oral TID WC Leeroy Bock, MD   6,000 Units at 05/11/23 1021   levothyroxine (SYNTHROID) tablet 100 mcg  100 mcg Oral Q0600 Pilar Jarvis, MD   100 mcg at 05/11/23 0601   lipase/protease/amylase (CREON) capsule 24,000 Units  24,000 Units Oral TID AC Lolita Patella B, MD   24,000 Units at 05/11/23 1022   loperamide (IMODIUM) capsule 2 mg  2 mg Oral PRN Leeroy Bock, MD   2 mg at 05/06/23 2212   metoprolol succinate (TOPROL-XL) 24 hr tablet 12.5 mg  12.5 mg Oral Daily Agbata, Tochukwu, MD   12.5 mg at 05/11/23 1020   mirtazapine (REMERON) tablet 15 mg  15 mg Oral QHS Clapacs, John T, MD   15 mg at 05/10/23 2156   multivitamin with minerals tablet 1 tablet  1 tablet Oral BID Agbata, Tochukwu, MD   1 tablet at 05/11/23 1021   pantoprazole (PROTONIX) EC tablet 40 mg  40 mg Oral QHS Orson Aloe, RPH   40 mg at 05/10/23 2156   polyethylene glycol (MIRALAX / GLYCOLAX) packet 17 g  17 g Oral Daily PRN Jimmye Norman,  NP       rifaximin Burman Blacksmith) tablet 550 mg  550 mg Oral TID Lolita Patella B, MD   550 mg at 05/11/23 1022   Rivaroxaban (XARELTO) tablet 15 mg  15 mg Oral BID WC Arnetha Courser, MD   15 mg at 05/11/23 1021   Followed by   Melene Muller ON 05/30/2023] rivaroxaban (XARELTO)  tablet 20 mg  20 mg Oral Q supper Arnetha Courser, MD       sodium bicarbonate 150 mEq in sterile water 1,150 mL infusion   Intravenous Continuous Cherylann Ratel, [place], MD 50 mL/hr at 05/10/23 2253 New Bag at 05/10/23 2253   thiamine (VITAMIN B1) tablet 100 mg  100 mg Oral Daily Erin Fulling, MD   100 mg at 05/11/23 1021   vitamin A capsule 10,000 Units  10,000 Units Oral Daily Erin Fulling, MD   10,000 Units at 05/11/23 1022   Vitamin D (Ergocalciferol) (DRISDOL) 1.25 MG (50000 UNIT) capsule 50,000 Units  50,000 Units Oral Q7 days Erin Fulling, MD   50,000 Units at 05/07/23 1035   Vitamin E CAPS 400 Units  400 Units Oral Daily Erin Fulling, MD   400 Units at 05/11/23 1022   zinc sulfate capsule 220 mg  220 mg Oral Daily Erin Fulling, MD   220 mg at 05/11/23 1020     Abtx:  Anti-infectives (From admission, onward)    Start     Dose/Rate Route Frequency Ordered Stop   05/10/23 0600  vancomycin variable dose per unstable renal function (pharmacist dosing)  Status:  Discontinued         Does not apply See admin instructions 05/09/23 1518 05/10/23 0423   05/10/23 0520  vancomycin variable dose per unstable renal function (pharmacist dosing)  Status:  Discontinued         Does not apply See admin instructions 05/10/23 0520 05/10/23 1205   05/10/23 0515  ceFEPIme (MAXIPIME) 2 g in sodium chloride 0.9 % 100 mL IVPB        2 g 200 mL/hr over 30 Minutes Intravenous Every 12 hours 05/10/23 0424     05/09/23 1830  vancomycin (VANCOREADY) IVPB 500 mg/100 mL  Status:  Discontinued        500 mg 100 mL/hr over 60 Minutes Intravenous  Once 05/09/23 1517 05/09/23 1524   05/09/23 1800  cefTRIAXone (ROCEPHIN) 2 g in sodium chloride 0.9 % 100 mL IVPB   Status:  Discontinued        2 g 200 mL/hr over 30 Minutes Intravenous Every 24 hours 05/09/23 1654 05/10/23 0423   05/09/23 1715  vancomycin (VANCOREADY) IVPB 1250 mg/250 mL        1,250 mg 166.7 mL/hr over 90 Minutes Intravenous  Once 05/09/23 1623 05/09/23 1839   05/09/23 1615  vancomycin (VANCOREADY) IVPB 2000 mg/400 mL  Status:  Discontinued        2,000 mg 200 mL/hr over 120 Minutes Intravenous  Once 05/09/23 1517 05/09/23 1524   04/29/23 1200  rifaximin (XIFAXAN) tablet 550 mg        550 mg Oral 3 times daily 04/29/23 1111 05/13/23 0959   04/24/23 1210  vancomycin variable dose per unstable renal function (pharmacist dosing)  Status:  Discontinued         Does not apply See admin instructions 04/24/23 1210 05/01/23 1025   04/22/23 2200  vancomycin (VANCOREADY) IVPB 1750 mg/350 mL  Status:  Discontinued        1,750 mg 175 mL/hr over 120 Minutes Intravenous Every 12 hours 04/22/23 1141 04/24/23 1209   04/22/23 1800  cefTRIAXone (ROCEPHIN) 2 g in sodium chloride 0.9 % 100 mL IVPB  Status:  Discontinued        2 g 200 mL/hr over 30 Minutes Intravenous Daily 04/22/23 1201 04/24/23 1739   04/22/23 1230  vancomycin (VANCOREADY) IVPB 2000 mg/400 mL  2,000 mg 200 mL/hr over 120 Minutes Intravenous  Once 04/22/23 1141 04/22/23 1533   04/21/23 1500  vancomycin (VANCOREADY) IVPB 2000 mg/400 mL  Status:  Discontinued        2,000 mg 200 mL/hr over 120 Minutes Intravenous Every 12 hours 04/21/23 0645 04/21/23 1253   04/21/23 1400  linezolid (ZYVOX) IVPB 600 mg  Status:  Discontinued        600 mg 300 mL/hr over 60 Minutes Intravenous Every 12 hours 04/21/23 1253 04/22/23 1130   04/21/23 0730  piperacillin-tazobactam (ZOSYN) IVPB 3.375 g        3.375 g 12.5 mL/hr over 240 Minutes Intravenous Every 8 hours 04/21/23 0638 04/22/23 1328   04/21/23 0215  ceFEPIme (MAXIPIME) 2 g in sodium chloride 0.9 % 100 mL IVPB        2 g 200 mL/hr over 30 Minutes Intravenous  Once 04/21/23 0204  04/21/23 0333   04/21/23 0215  vancomycin (VANCOCIN) IVPB 1000 mg/200 mL premix       See Hyperspace for full Linked Orders Report.   1,000 mg 200 mL/hr over 60 Minutes Intravenous  Once 04/21/23 0211 04/21/23 0420   04/21/23 0215  vancomycin (VANCOREADY) IVPB 1500 mg/300 mL       See Hyperspace for full Linked Orders Report.   1,500 mg 150 mL/hr over 120 Minutes Intravenous  Once 04/21/23 0211 04/21/23 0706       REVIEW OF SYSTEMS:  Const:  fever, negative chills,  weight loss Eyes: negative diplopia or visual changes, negative eye pain ENT: negative coryza, negative sore throat Resp: negative cough, hemoptysis, dyspnea Cards: negative for chest pain, palpitations, lower extremity edema GU: negative for frequency, dysuria and hematuria GI: Negative for abdominal pain, diarrhea, bleeding, constipation Skin: negative for rash and pruritus Heme: negative for easy bruising and gum/nose bleeding ZO:[id number] Neurolo:negative for headaches, dizziness, vertigo, memory problems  Psych: anxiety, Endocrine:  thyroid, diabetes Allergy/Immunology- none Objective:  VITALS:  BP (!) 81/67 (BP Location: Left Arm)   Pulse (!) 110   Temp 98 F (36.7 C) (Oral)   Resp 18   Ht 5\' 5"  (1.651 m)   Wt (!) 169.4 kg   SpO2 99%   BMI 62.15 kg/m   PHYSICAL EXAM:  General: Alert, cooperative, no distress, appears stated age.  Head: Normocephalic, without obvious abnormality, atraumatic. Eyes: Conjunctivae clear, anicteric sclerae. Pupils are equal ENT Nares normal. No drainage or sinus tenderness. Lips, mucosa, and tongue normal. No Thrush Neck symmetrical, no adenopathy, thyroid: non tender no carotid bruit and no JVD. Back: small skin tear sacral area Lungs: b/l air entry Heart: tachycardia Abdomen: Soft, non-tender,not distended. Bowel sounds normal. No masses Extremities: b/l lymphedema with verrucous skin changes  Left thigh superfical wound with plenty of clear discharge and some  slouh   Wound rt leg posteriorly  Skin: tears over the the left popliteal area Lymph: Cervical, supraclavicular normal. Neurologic: Grossly non-focal Pertinent Labs Lab Results CBC    Component Value Date/Time   WBC 13.2 (H) 05/11/2023 0641   RBC 2.77 (L) 05/11/2023 0641   HGB 7.9 (L) 05/11/2023 0641   HGB 12.8 (L) 02/11/2012 1227   HCT 23.5 (L) 05/11/2023 0641   HCT 39.3 (L) 02/11/2012 1227   PLT 207 05/11/2023 0641   PLT 333 02/11/2012 1227   MCV 84.8 05/11/2023 0641   MCV 83 02/11/2012 1227   MCH 28.5 05/11/2023 0641   MCHC 33.6 05/11/2023 0641   RDW 19.6 (H) 05/11/2023 8119  RDW 16.0 (H) 02/11/2012 1227   LYMPHSABS 0.8 05/11/2023 0641   LYMPHSABS 2.2 02/11/2012 1227   MONOABS 0.6 05/11/2023 0641   MONOABS 0.5 02/11/2012 1227   EOSABS 0.4 05/11/2023 0641   EOSABS 0.1 02/11/2012 1227   BASOSABS 0.1 05/11/2023 0641   BASOSABS 0.0 02/11/2012 1227       Latest Ref Rng & Units 05/11/2023    6:41 AM 05/10/2023    5:46 AM 05/09/2023    6:12 AM  CMP  Glucose 70 - 99 mg/dL 89  161  096   BUN 6 - 20 mg/dL 61  62  63   Creatinine 0.61 - 1.24 mg/dL 0.45  4.09  8.11   Sodium 135 - 145 mmol/L 137  136  134   Potassium 3.5 - 5.1 mmol/L 3.4  3.5  3.4   Chloride 98 - 111 mmol/L 110  110  111   CO2 22 - 32 mmol/L 18  17  16    Calcium 8.9 - 10.3 mg/dL 8.2  8.1  8.1       Microbiology: Recent Results (from the past 240 hour(s))  Culture, blood (Routine X 2) w Reflex to ID Panel     Status: Abnormal (Preliminary result)   Collection Time: 05/09/23  9:09 AM   Specimen: BLOOD  Result Value Ref Range Status   Specimen Description   Final    BLOOD A-LINE Performed at [facility], [address], [place], [place] [address]    Special Requests   Final    BOTTLES DRAWN AEROBIC AND ANAEROBIC AEROMONAS CAVIAE Performed at [facility], [address] [place], [place] [address]    Culture  Setup Time   Final    GRAM NEGATIVE RODS IN BOTH AEROBIC AND  ANAEROBIC BOTTLES CRITICAL RESULT CALLED TO, READ BACK BY AND VERIFIED WITH: JASON ROBINS @0420  05/10/23 [facility]    Culture (A)  Final    PSEUDOMONAS AERUGINOSA SUSCEPTIBILITIES TO FOLLOW Performed at [facility], [address], [place], [place] [address]    Report Status PENDING  Incomplete  Culture, blood (Routine X 2) w Reflex to ID Panel     Status: None (Preliminary result)   Collection Time: 05/09/23  9:09 AM   Specimen: BLOOD LEFT ARM  Result Value Ref Range Status   Specimen Description BLOOD LEFT ARM  Final   Special Requests   Final    BOTTLES DRAWN AEROBIC ONLY Blood Culture adequate volume   Culture   Final    NO GROWTH 2 DAYS Performed at [facility], [address], [place], [place] [address]    Report Status PENDING  Incomplete  Blood Culture ID Panel (Reflexed)     Status: Abnormal   Collection Time: 05/09/23  9:09 AM  Result Value Ref Range Status   Enterococcus faecalis NOT DETECTED NOT DETECTED Final   Enterococcus Faecium NOT DETECTED NOT DETECTED Final   Listeria monocytogenes NOT DETECTED NOT DETECTED Final   Staphylococcus species NOT DETECTED NOT DETECTED Final   Staphylococcus aureus (BCID) NOT DETECTED NOT DETECTED Final   Staphylococcus epidermidis NOT DETECTED NOT DETECTED Final   Staphylococcus lugdunensis NOT DETECTED NOT DETECTED Final   Streptococcus species NOT DETECTED NOT DETECTED Final   Streptococcus agalactiae NOT DETECTED NOT DETECTED Final   Streptococcus pneumoniae NOT DETECTED NOT DETECTED Final   Streptococcus pyogenes NOT DETECTED NOT DETECTED Final   A.calcoaceticus-baumannii NOT DETECTED NOT DETECTED Final   Bacteroides fragilis NOT DETECTED NOT DETECTED  Final   Enterobacterales NOT DETECTED NOT DETECTED Final   Enterobacter cloacae complex NOT DETECTED NOT DETECTED Final   Escherichia coli NOT DETECTED NOT DETECTED Final   Klebsiella aerogenes NOT DETECTED NOT DETECTED Final   Klebsiella oxytoca NOT DETECTED NOT  DETECTED Final   Klebsiella pneumoniae NOT DETECTED NOT DETECTED Final   Proteus species NOT DETECTED NOT DETECTED Final   Salmonella species NOT DETECTED NOT DETECTED Final   Serratia marcescens NOT DETECTED NOT DETECTED Final   Haemophilus influenzae NOT DETECTED NOT DETECTED Final   Neisseria meningitidis NOT DETECTED NOT DETECTED Final   Pseudomonas aeruginosa DETECTED (A) NOT DETECTED Final    Comment: CRITICAL RESULT CALLED TO, READ BACK BY AND VERIFIED WITH: JASON ROBINS @ 0420 05/10/23 [facility]    Stenotrophomonas maltophilia NOT DETECTED NOT DETECTED Final   Candida albicans NOT DETECTED NOT DETECTED Final   Candida auris NOT DETECTED NOT DETECTED Final   Candida glabrata NOT DETECTED NOT DETECTED Final   Candida krusei NOT DETECTED NOT DETECTED Final   Candida parapsilosis NOT DETECTED NOT DETECTED Final   Candida tropicalis NOT DETECTED NOT DETECTED Final   Cryptococcus neoformans/gattii NOT DETECTED NOT DETECTED Final   CTX-M ESBL NOT DETECTED NOT DETECTED Final   Carbapenem resistance IMP NOT DETECTED NOT DETECTED Final   Carbapenem resistance KPC NOT DETECTED NOT DETECTED Final   Carbapenem resistance NDM NOT DETECTED NOT DETECTED Final   Carbapenem resistance VIM NOT DETECTED NOT DETECTED Final    Comment: Performed at [facility], [address] [place], [place] [address]    IMAGING RESULTS: CSR no infitrate I have personally reviewed the films ? Impression/Recommendation pseudomonas bacteremia 2 weeks into hospitalization- has multiple wounds- will send culture Also had a central line which has been removed Fever and leucocytosis much improved Continue cefepime  Severe hypothyroidism- new diagnosis on synthroid On admission was hypothermic, hyponatremic thought to be due to above  Shock- sepsis VS SIR - resolved  Worsening AKI- creatinine plateud at 3.05  Severe obesity  Gastric bypass  Anemia  Prolonged clearance of vanco, with increase in  levels off antibiotic for nearly 2 weeks   ___________________________________________________ Discussed with patient, requesting provider and his nurses

## 2023-05-11 NOTE — Progress Notes (Signed)
Christopher Herman, Christopher   Brief Narrative: Patient is a 47 y.o. male with a PMH significant for severe obesity s/p bariatric Herman, Christopher Herman, Christopher Herman, Christopher Herman, Christopher Herman, Christopher Herman, Christopher Herman, Christopher Herman ,status post IVC filter placement, IDA who presented from home to the ED on 04/21/2023 with generalized weakness, extreme fatigue, unintended weight loss, disuse of upper extremities and poor p.o. intake.Marland Kitchen He is chronically bed bound and malnourished.  Initially septic shock was suspected, admitted to ICU, started on broad-spectrum antibiotics.  Also required vasopressors.  Vasopressors have been discontinued.  Currently blood pressure stable.  Hospital course remarkable for worsening kidney function.  Nephrology following, suspected to have ATN.  PT/OT recommending SNF/long-term care.  TOC following.  Palliative care was also consulted for goals of care.  Remains full code.  5/11: Overnight developed fever at 101.  Repeat blood cultures were sent.  Intermittent diarrhea with Christopher acute change.  Worsening leukocytosis.  Prior blood cultures with Staphylococcus reticularis, resistant to oxacillin.  Restarting vancomycin while waiting for blood culture results. Lower Herman venous Doppler done on 5/8 was positive for significant left lower Herman DVT-starting him on Xarelto. If developed any respiratory compromise then will need VQ scan to rule out PE.  5/12: Hemodynamically stable and afebrile.  Preliminary blood culture with Pseudomonas-vancomycin was discontinued and ceftriaxone switched with cefepime.  Hemoglobin decreased to 6.5, Christopher obvious bleeding.  Ordered 1 unit of PRBC.  Patient was started on Xarelto yesterday.  Renal function stable.  5/13: Hemodynamically stable.  ID was consulted for positive blood cultures, still pending susceptibility for Pseudomonas aeruginosa.  Repeating blood cultures  before deciding about PICC line.  ID also ordered wound culture  Assessment & Plan:  Principal Problem:   Severe hypothyroidism Active Problems:   Malnutrition of moderate degree (HCC)   Generalized weakness   Ulcer of right ankle (HCC)   Hypotension due to hypovolemia   Acute renal failure due to tubular necrosis (HCC)   Hypothyroidism  Septic shock: Presented with severe hypotension.  Admitted for septic shock.  Initially required ICU care, given vasopressors.  Blood cultures showed staph auricularis, capitis.  Completed antibiotics course.  Blood pressure has been stable.  Currently on gentle bicarb drip, IV albumin  5/11: Patient became febrile again overnight.  Repeat blood cultures were sent and also restarting vancomycin. Patient still has central line in place due to unable to get a peripheral line.   5/12: Preliminary cultures with Pseudomonas.  Ceftriaxone switched to cefepime and vancomycin was discontinued. 5/13: Repeat blood cultures along with wound cultures were ordered.  Left lower Herman DVT.  Lower Herman venous Doppler was obtained on 5/8 which shows pretty significant left lower Herman DVT. -Start him on Xarelto -Monitor for any respiratory compromise-if developed any shortness of breath then will need VQ scan  AKI: Worsening kidney function during this hospitalization.  Renal ultrasound negative for hydronephrosis or obstruction.  Suspected from ATN.  Serological workup has been negative so far.  Adequate urine output.  Nephrology following.  Christopher indication for dialysis.  Creatinine currently in the range of 3  Christopher combined systolic /diastolic CHF: Echo done on 4/24 showed EF of 35 to 40%, global hypokinesis, grade 1 diastolic dysfunction.  Currently he looks euvolemic  Hypokalemia: Improved.  Continue to monitor and replete as needed.  Severe hypothyroidism: Given IV Synthyroid.  Currently on oral Synthyroid.  Follow-up thyroid function test in 4 to 6  weeks.  Leukocytosis/thrombocytosis: Worsening leukocytosis today.  Anemia of Christopher disease: Got total of 3 units of blood transfusion during his hospitalization.  Continue monitoring.  Hemoglobin currently in the range of 7 -Hemoglobin at 7.9 after getting third unit yesterday -Continue to monitor -Transfuse if below 7  Christopher sinus tachycardia: Christopher problem.  QTc also prolonged.  Avoid QTc prolonging medications.  On low-dose metoprolol  Depression: Continue Remeron  Christopher abdomen pain/diarrhea :He complains of Christopher abdominal pain and fear of eating as it precipitates diarrhea.  RD has been seeing him and he is on several agents both to help with appetite and hopefully decrease some of his diarrhea and bloating sensations.  Christopher acute change as he will remain high risk for C. Difficile.  Morbid obesity/bedbound status: PT/OT recommending.  Long-term facility.  TOC following.  Goals of care: Morbidly obese patient with multiple medical problems.  History of bariatric Herman, Christopher Herman, obesity Christopher Herman, Christopher Herman, status post IVC filter placement. bedbound status.  Palliative care was following for goals of care, remains full code   Nutrition Problem: Moderate Malnutrition Etiology: Christopher illness (h/o roux-en-y gastric bypass) Pressure Injury 04/21/23 Ankle Right Stage 2 -  Partial thickness loss of dermis presenting as a shallow open injury with a red, pink wound bed without slough. (Active)  04/21/23 0600  Location: Ankle  Location Orientation: Right  Staging: Stage 2 -  Partial thickness loss of dermis presenting as a shallow open injury with a red, pink wound bed without slough.  Wound Description (Comments):   Present on Admission: Yes  Dressing Type Foam - Lift dressing to assess site every shift 05/10/23 2000    DVT prophylaxis: Rivaroxaban (XARELTO) tablet 15 mg  rivaroxaban (XARELTO) tablet 20 mg     Code Status: Full Code  Family  Communication: Discussed with patient Patient status:Inpatient  Patient is from :Home  Anticipated discharge WU:JWJX vs SNF  Estimated DC date:not sure   Consultants: Nephrology, PCCM   Antimicrobials:  Anti-infectives (From admission, onward)    Start     Dose/Rate Route Frequency Ordered Stop   05/10/23 0600  vancomycin variable dose per unstable renal function (pharmacist dosing)  Status:  Discontinued         Does not apply See admin instructions 05/09/23 1518 05/10/23 0423   05/10/23 0520  vancomycin variable dose per unstable renal function (pharmacist dosing)  Status:  Discontinued         Does not apply See admin instructions 05/10/23 0520 05/10/23 1205   05/10/23 0515  ceFEPIme (MAXIPIME) 2 g in sodium chloride 0.9 % 100 mL IVPB        2 g 200 mL/hr over 30 Minutes Intravenous Every 12 hours 05/10/23 0424     05/09/23 1830  vancomycin (VANCOREADY) IVPB 500 mg/100 mL  Status:  Discontinued        500 mg 100 mL/hr over 60 Minutes Intravenous  Once 05/09/23 1517 05/09/23 1524   05/09/23 1800  cefTRIAXone (ROCEPHIN) 2 g in sodium chloride 0.9 % 100 mL IVPB  Status:  Discontinued        2 g 200 mL/hr over 30 Minutes Intravenous Every 24 hours 05/09/23 1654 05/10/23 0423   05/09/23 1715  vancomycin (VANCOREADY) IVPB 1250 mg/250 mL        1,250 mg 166.7 mL/hr over 90 Minutes Intravenous  Once 05/09/23 1623 05/09/23 1839   05/09/23 1615  vancomycin (VANCOREADY) IVPB 2000 mg/400 mL  Status:  Discontinued  2,000 mg 200 mL/hr over 120 Minutes Intravenous  Once 05/09/23 1517 05/09/23 1524   04/29/23 1200  rifaximin (XIFAXAN) tablet 550 mg        550 mg Oral 3 times daily 04/29/23 1111 05/13/23 0959   04/24/23 1210  vancomycin variable dose per unstable renal function (pharmacist dosing)  Status:  Discontinued         Does not apply See admin instructions 04/24/23 1210 05/01/23 1025   04/22/23 2200  vancomycin (VANCOREADY) IVPB 1750 mg/350 mL  Status:  Discontinued         1,750 mg 175 mL/hr over 120 Minutes Intravenous Every 12 hours 04/22/23 1141 04/24/23 1209   04/22/23 1800  cefTRIAXone (ROCEPHIN) 2 g in sodium chloride 0.9 % 100 mL IVPB  Status:  Discontinued        2 g 200 mL/hr over 30 Minutes Intravenous Daily 04/22/23 1201 04/24/23 1739   04/22/23 1230  vancomycin (VANCOREADY) IVPB 2000 mg/400 mL        2,000 mg 200 mL/hr over 120 Minutes Intravenous  Once 04/22/23 1141 04/22/23 1533   04/21/23 1500  vancomycin (VANCOREADY) IVPB 2000 mg/400 mL  Status:  Discontinued        2,000 mg 200 mL/hr over 120 Minutes Intravenous Every 12 hours 04/21/23 0645 04/21/23 1253   04/21/23 1400  linezolid (ZYVOX) IVPB 600 mg  Status:  Discontinued        600 mg 300 mL/hr over 60 Minutes Intravenous Every 12 hours 04/21/23 1253 04/22/23 1130   04/21/23 0730  piperacillin-tazobactam (ZOSYN) IVPB 3.375 g        3.375 g 12.5 mL/hr over 240 Minutes Intravenous Every 8 hours 04/21/23 0638 04/22/23 1328   04/21/23 0215  ceFEPIme (MAXIPIME) 2 g in sodium chloride 0.9 % 100 mL IVPB        2 g 200 mL/hr over 30 Minutes Intravenous  Once 04/21/23 0204 04/21/23 0333   04/21/23 0215  vancomycin (VANCOCIN) IVPB 1000 mg/200 mL premix       See Hyperspace for full Linked Orders Report.   1,000 mg 200 mL/hr over 60 Minutes Intravenous  Once 04/21/23 0211 04/21/23 0420   04/21/23 0215  vancomycin (VANCOREADY) IVPB 1500 mg/300 mL       See Hyperspace for full Linked Orders Report.   1,500 mg 150 mL/hr over 120 Minutes Intravenous  Once 04/21/23 0211 04/21/23 0706       Subjective: Patient with Christopher new complaint today.  Objective: Vitals:   05/10/23 2354 05/11/23 0400 05/11/23 0804 05/11/23 1137  BP: 109/75 108/78 (!) 81/67   Pulse: (!) 105 (!) 104 (!) 110   Resp: 20 16 18    Temp: 98.5 F (36.9 C) 97.9 F (36.6 C) 98.6 F (37 C) 98 F (36.7 C)  TempSrc: Oral Oral Oral Oral  SpO2: 99% 99% 99%   Weight:   (!) 169.4 kg   Height:        Intake/Output Summary (Last  24 hours) at 05/11/2023 1404 Last data filed at 05/11/2023 1045 Gross per 24 hour  Intake 2001 ml  Output 750 ml  Net 1251 ml    Filed Weights   05/08/23 0702 05/08/23 0825 05/11/23 0804  Weight: (!) 145.6 kg (!) 145.2 kg (!) 169.4 kg    Examination: General.  Morbidly obese gentleman, in Christopher acute distress. Pulmonary.  Lungs clear bilaterally, normal respiratory effort. CV.  Regular rate and rhythm, Christopher JVD, rub or murmur. Abdomen.  Soft, nontender, nondistended, BS  positive. CNS.  Alert and oriented .  Christopher focal neurologic deficit. Extremities.  Christopher edema, Christopher cyanosis, pulses intact and symmetrical.  Significant Herman Psychiatry.  Judgment and insight appears normal.     Data Reviewed: I have personally reviewed following labs and imaging studies  CBC: Recent Labs  Lab 05/07/23 0500 05/08/23 0800 05/09/23 0612 05/10/23 0546 05/10/23 1004 05/11/23 0641  WBC 12.4* 11.3* 29.8* 15.8*  --  13.2*  NEUTROABS 10.1* 9.6* 28.7* 14.4*  --  11.1*  HGB 8.3* 7.7* 7.3* 6.5* 7.6* 7.9*  HCT 25.3* 23.8* 22.7* 19.6* 22.6* 23.5*  MCV 86.3 85.9 87.3 86.7  --  84.8  PLT 237 239 197 168  --  207    Basic Metabolic Panel: Recent Labs  Lab 05/07/23 0500 05/08/23 0800 05/09/23 0612 05/10/23 0546 05/11/23 0641  NA 134* 134* 134* 136 137  K 3.6 3.4* 3.4* 3.5 3.4*  CL 109 110 111 110 110  CO2 14* 15* 16* 17* 18*  GLUCOSE 89 96 115* 104* 89  BUN 65* 63* 63* 62* 61*  CREATININE 2.74* 3.13* 3.03* 3.03* 3.05*  CALCIUM 8.4* 8.1* 8.1* 8.1* 8.2*  PHOS 3.9 4.1 3.3 3.5 3.7      Recent Results (from the past 240 hour(s))  Culture, blood (Routine X 2) w Reflex to ID Panel     Status: Abnormal (Preliminary result)   Collection Time: 05/09/23  9:09 AM   Specimen: BLOOD  Result Value Ref Range Status   Specimen Description   Final    BLOOD A-LINE Performed at [facility], [address], [place], [place] [address]    Special Requests   Final    BOTTLES DRAWN AEROBIC AND  ANAEROBIC AEROMONAS CAVIAE Performed at [facility], [address] [place], [place] [address]    Culture  Setup Time   Final    GRAM NEGATIVE RODS IN BOTH AEROBIC AND ANAEROBIC BOTTLES CRITICAL RESULT CALLED TO, READ BACK BY AND VERIFIED WITH: JASON ROBINS @0420  05/10/23 [facility]    Culture (A)  Final    PSEUDOMONAS AERUGINOSA SUSCEPTIBILITIES TO FOLLOW Performed at [facility], [address], [place], [place] [address]    Report Status PENDING  Incomplete  Culture, blood (Routine X 2) w Reflex to ID Panel     Status: None (Preliminary result)   Collection Time: 05/09/23  9:09 AM   Specimen: BLOOD LEFT ARM  Result Value Ref Range Status   Specimen Description BLOOD LEFT ARM  Final   Special Requests   Final    BOTTLES DRAWN AEROBIC ONLY Blood Culture adequate volume   Culture   Final    Christopher GROWTH 2 DAYS Performed at [facility], [address], [place], [place] [address]    Report Status PENDING  Incomplete  Blood Culture ID Panel (Reflexed)     Status: Abnormal   Collection Time: 05/09/23  9:09 AM  Result Value Ref Range Status   Enterococcus faecalis NOT DETECTED NOT DETECTED Final   Enterococcus Faecium NOT DETECTED NOT DETECTED Final   Listeria monocytogenes NOT DETECTED NOT DETECTED Final   Staphylococcus species NOT DETECTED NOT DETECTED Final   Staphylococcus aureus (BCID) NOT DETECTED NOT DETECTED Final   Staphylococcus epidermidis NOT DETECTED NOT DETECTED Final   Staphylococcus lugdunensis NOT DETECTED NOT DETECTED Final   Streptococcus species NOT DETECTED NOT DETECTED Final   Streptococcus agalactiae NOT DETECTED NOT DETECTED Final   Streptococcus pneumoniae NOT DETECTED NOT DETECTED Final  Streptococcus pyogenes NOT DETECTED NOT DETECTED Final   A.calcoaceticus-baumannii NOT DETECTED NOT DETECTED Final   Bacteroides fragilis NOT DETECTED NOT DETECTED Final   Enterobacterales NOT DETECTED NOT DETECTED Final   Enterobacter cloacae  complex NOT DETECTED NOT DETECTED Final   Escherichia coli NOT DETECTED NOT DETECTED Final   Klebsiella aerogenes NOT DETECTED NOT DETECTED Final   Klebsiella oxytoca NOT DETECTED NOT DETECTED Final   Klebsiella pneumoniae NOT DETECTED NOT DETECTED Final   Proteus species NOT DETECTED NOT DETECTED Final   Salmonella species NOT DETECTED NOT DETECTED Final   Serratia marcescens NOT DETECTED NOT DETECTED Final   Haemophilus influenzae NOT DETECTED NOT DETECTED Final   Neisseria meningitidis NOT DETECTED NOT DETECTED Final   Pseudomonas aeruginosa DETECTED (A) NOT DETECTED Final    Comment: CRITICAL RESULT CALLED TO, READ BACK BY AND VERIFIED WITH: JASON ROBINS @ 0420 05/10/23 [facility]    Stenotrophomonas maltophilia NOT DETECTED NOT DETECTED Final   Candida albicans NOT DETECTED NOT DETECTED Final   Candida auris NOT DETECTED NOT DETECTED Final   Candida glabrata NOT DETECTED NOT DETECTED Final   Candida krusei NOT DETECTED NOT DETECTED Final   Candida parapsilosis NOT DETECTED NOT DETECTED Final   Candida tropicalis NOT DETECTED NOT DETECTED Final   Cryptococcus neoformans/gattii NOT DETECTED NOT DETECTED Final   CTX-M ESBL NOT DETECTED NOT DETECTED Final   Carbapenem resistance IMP NOT DETECTED NOT DETECTED Final   Carbapenem resistance KPC NOT DETECTED NOT DETECTED Final   Carbapenem resistance NDM NOT DETECTED NOT DETECTED Final   Carbapenem resistance VIM NOT DETECTED NOT DETECTED Final    Comment: Performed at [facility], [address] [place], [place] [address]     Radiology Studies: Christopher results found.  Scheduled Meds:  vitamin C  500 mg Oral BID   Chlorhexidine Gluconate Cloth  6 each Topical Daily   copper  8 mg Oral Daily   folic acid  1 mg Oral Daily   insulin aspart  0-15 Units Subcutaneous TID WC   lactase  6,000 Units Oral TID WC   levothyroxine  100 mcg Oral Q0600   lipase/protease/amylase  24,000 Units Oral TID AC   metoprolol succinate  12.5 mg Oral  Daily   mirtazapine  15 mg Oral QHS   multivitamin with minerals  1 tablet Oral BID   pantoprazole  40 mg Oral QHS   rifaximin  550 mg Oral TID   Rivaroxaban  15 mg Oral BID WC   Followed by   Melene Muller ON 05/30/2023] rivaroxaban  20 mg Oral Q supper   thiamine  100 mg Oral Daily   vitamin A  10,000 Units Oral Daily   Vitamin D (Ergocalciferol)  50,000 Units Oral Q7 days   Vitamin E  400 Units Oral Daily   zinc sulfate  220 mg Oral Daily   Continuous Infusions:  ceFEPime (MAXIPIME) IV 2 g (05/11/23 0601)   sodium bicarbonate 150 mEq in sterile water 1,150 mL infusion 50 mL/hr at 05/10/23 2253     LOS: 20 days   Arnetha Courser, MD [facility] P5/13/2024, 2:04 PM

## 2023-05-11 NOTE — Progress Notes (Signed)
ID doctor here. We opened all dressings and she looked at each one, swabbed with culture swabs and the CNA, charge nurse and myself, all gave the pt a full bath and changed all the dressings on pt.

## 2023-05-12 DIAGNOSIS — E669 Obesity, unspecified: Secondary | ICD-10-CM

## 2023-05-12 DIAGNOSIS — B965 Pseudomonas (aeruginosa) (mallei) (pseudomallei) as the cause of diseases classified elsewhere: Secondary | ICD-10-CM

## 2023-05-12 DIAGNOSIS — E039 Hypothyroidism, unspecified: Secondary | ICD-10-CM | POA: Diagnosis not present

## 2023-05-12 DIAGNOSIS — Z9884 Bariatric surgery status: Secondary | ICD-10-CM

## 2023-05-12 DIAGNOSIS — I890 Lymphedema, not elsewhere classified: Secondary | ICD-10-CM | POA: Diagnosis not present

## 2023-05-12 DIAGNOSIS — R7881 Bacteremia: Secondary | ICD-10-CM

## 2023-05-12 DIAGNOSIS — T07XXXA Unspecified multiple injuries, initial encounter: Secondary | ICD-10-CM

## 2023-05-12 LAB — GLUCOSE, CAPILLARY
Glucose-Capillary: 96 mg/dL (ref 70–99)
Glucose-Capillary: 87 mg/dL (ref 70–99)

## 2023-05-12 LAB — PHOSPHORUS: Phosphorus: 3.1 mg/dL (ref 2.5–4.6)

## 2023-05-12 LAB — CULTURE, BLOOD (ROUTINE X 2): Culture: NO GROWTH

## 2023-05-12 LAB — CBC WITH DIFFERENTIAL/PLATELET
Abs Immature Granulocytes: 0.05 10*3/uL (ref 0.00–0.07)
Basophils Absolute: 0 10*3/uL (ref 0.0–0.1)
Basophils Relative: 0 %
Eosinophils Absolute: 0.2 10*3/uL (ref 0.0–0.5)
Eosinophils Relative: 2 %
HCT: 25.6 % — ABNORMAL LOW (ref 39.0–52.0)
Hemoglobin: 8.4 g/dL — ABNORMAL LOW (ref 13.0–17.0)
Immature Granulocytes: 1 %
Lymphocytes Relative: 6 %
Lymphs Abs: 0.6 10*3/uL — ABNORMAL LOW (ref 0.7–4.0)
MCH: 28.2 pg (ref 26.0–34.0)
MCHC: 32.8 g/dL (ref 30.0–36.0)
MCV: 85.9 fL (ref 80.0–100.0)
Monocytes Absolute: 0.5 10*3/uL (ref 0.1–1.0)
Monocytes Relative: 5 %
Neutro Abs: 9 10*3/uL — ABNORMAL HIGH (ref 1.7–7.7)
Neutrophils Relative %: 86 %
Platelets: 223 10*3/uL (ref 150–400)
RBC: 2.98 MIL/uL — ABNORMAL LOW (ref 4.22–5.81)
RDW: 19.5 % — ABNORMAL HIGH (ref 11.5–15.5)
WBC: 10.4 10*3/uL (ref 4.0–10.5)
nRBC: 0 % (ref 0.0–0.2)

## 2023-05-12 LAB — AEROBIC CULTURE W GRAM STAIN (SUPERFICIAL SPECIMEN): Gram Stain: NONE SEEN

## 2023-05-12 MED ORDER — SODIUM CHLORIDE 0.9 % IV SOLN
1.0000 g | Freq: Two times a day (BID) | INTRAVENOUS | Status: DC
  Administered 2023-05-13: 1 g via INTRAVENOUS
  Filled 2023-05-12: qty 20

## 2023-05-12 MED ORDER — SODIUM CHLORIDE 0.9 % IV SOLN
2.0000 g | Freq: Once | INTRAVENOUS | Status: AC
  Administered 2023-05-12: 2 g via INTRAVENOUS
  Filled 2023-05-12: qty 20

## 2023-05-12 NOTE — Progress Notes (Signed)
Patient called nursing in to help with ordering breakfast.  Menu offered and cafeteria called to place order for patient.

## 2023-05-12 NOTE — Consult Note (Addendum)
WOC Nurse wound follow up; visit made after secure chat from bedside staff regarding high levels of drainage from wounds; see previous WOC notes 04/22/2023 and 05/07/2023  Wound type: full thickness  Measurement: 1.  L upper thigh full thickness 4 cm x 8 cms x 0.3 cms 80% red moist 20% yellow fibrin  2.  R upper medial thigh 4 cms x 2 cms x 0.1 full thickness 100% beefy red   Drainage (amount, consistency, odor) copious amounts of serous drainage from L thigh lesion; clear fluid noted to be running from wound, Drawtex dressing in place; patient states Drawtex was applied this morning around 4 a.m.; minimal serosanguinous from R thigh lesion  Periwound: chronic changes of lymphedema  Dressing procedure/placement/frequency: 1.  Clean Left upper thigh lesion witth NS, apply Drawtex to wound bed, cover with ABD pad and paper or cloth tape whichever secures best. ABD pad better than silicone foam for absorption of copious wound drainage.   2.  Clean right thigh lesion with NS, apply Silver hydrofiber [facility] [place] [phone]) cut to fit wound bed daily and cover with ABD pad and tape to secure.    Box of Drawtex noted in room, Hart [place] [phone].   Discussed situation with patient and bedside nurse.  Difficult situation due to lymphedema and other chronic medical conditions.    WOC team will not follow at this time. Re-consult if further needs arise.  Thank you,    Priscella Mann MSN, RN-BC, [employer] [phone]

## 2023-05-12 NOTE — Progress Notes (Signed)
[facility]  PROGRESS NOTE   Subjective:   UOP  No renal labs today.   Patient with no specific complaints.   PRBC transfusion yesterday.   Objective:  Vital signs: Blood pressure 102/78, pulse (!) 113, temperature 98.3 F (36.8 C), temperature source Oral, resp. rate 14, height 5\' 5"  (1.651 m), weight (!) 169.4 kg, SpO2 99 %.  Intake/Output Summary (Last 24 hours) at 05/12/2023 1926 Last data filed at 05/12/2023 1429 Gross per 24 hour  Intake 220 ml  Output 1200 ml  Net -980 ml    Filed Weights   05/08/23 0702 05/08/23 0825 05/11/23 0804  Weight: (!) 145.6 kg (!) 145.2 kg (!) 169.4 kg     Physical Exam: General:  No acute distress  Head:  Normocephalic, atraumatic. Moist oral mucosal membranes  Eyes:  Anicteric  Neck:  Supple  Lungs:   Clear to auscultation, normal effort  Heart:  S1S2 no rubs  Abdomen:   Soft, nontender, bowel sounds present  Extremities: 2+ peripheral edema.  Neurologic:  Awake, alert, following commands  Skin:  No lesions  :     Basic Metabolic Panel: Recent Labs  Lab 05/07/23 0500 05/08/23 0800 05/09/23 0612 05/10/23 0546 05/11/23 0641 05/12/23 0755  NA 134* 134* 134* 136 137  --   K 3.6 3.4* 3.4* 3.5 3.4*  --   CL 109 110 111 110 110  --   CO2 14* 15* 16* 17* 18*  --   GLUCOSE 89 96 115* 104* 89  --   BUN 65* 63* 63* 62* 61*  --   CREATININE 2.74* 3.13* 3.03* 3.03* 3.05*  --   CALCIUM 8.4* 8.1* 8.1* 8.1* 8.2*  --   PHOS 3.9 4.1 3.3 3.5 3.7 3.1    GFR: Estimated Creatinine Clearance: 45.3 mL/min (A) (by C-G formula based on SCr of 3.05 mg/dL (H)).  Liver Function Tests: No results for input(s): "AST", "ALT", "ALKPHOS", "BILITOT", "PROT", "ALBUMIN" in the last 168 hours. No results for input(s): "LIPASE", "AMYLASE" in the last 168 hours. No results for input(s): "AMMONIA" in the last 168 hours.  CBC: Recent Labs  Lab 05/08/23 0800 05/09/23 0612 05/10/23 0546 05/10/23 1004 05/11/23 0641  05/12/23 0755  WBC 11.3* 29.8* 15.8*  --  13.2* 10.4  NEUTROABS 9.6* 28.7* 14.4*  --  11.1* 9.0*  HGB 7.7* 7.3* 6.5* 7.6* 7.9* 8.4*  HCT 23.8* 22.7* 19.6* 22.6* 23.5* 25.6*  MCV 85.9 87.3 86.7  --  84.8 85.9  PLT 239 197 168  --  207 223      HbA1C: Hemoglobin A1C  Date/Time Value Ref Range Status  02/11/2012 12:27 PM 5.8 4.2 - 6.3 % Final    Comment:    The American Diabetes Association recommends that a primary goal of therapy should be <7% and that physicians should reevaluate the treatment regimen in patients with HbA1c values consistently >8%.     Urinalysis: No results for input(s): "COLORURINE", "LABSPEC", "PHURINE", "GLUCOSEU", "HGBUR", "BILIRUBINUR", "KETONESUR", "PROTEINUR", "UROBILINOGEN", "NITRITE", "LEUKOCYTESUR" in the last 72 hours.  Invalid input(s): "APPERANCEUR"     Imaging: No results found.   Medications:    [START ON 05/13/2023] meropenem (MERREM) IV     sodium bicarbonate 150 mEq in sterile water 1,150 mL infusion 50 mL/hr at 05/12/23 0519    vitamin C  500 mg Oral BID   Chlorhexidine Gluconate Cloth  6 each Topical Daily   copper  8 mg Oral Daily   folic acid  1 mg  Oral Daily   lactase  6,000 Units Oral TID WC   levothyroxine  100 mcg Oral Q0600   lipase/protease/amylase  24,000 Units Oral TID AC   metoprolol succinate  12.5 mg Oral Daily   mirtazapine  15 mg Oral QHS   multivitamin with minerals  1 tablet Oral BID   pantoprazole  40 mg Oral QHS   rifaximin  550 mg Oral TID   Rivaroxaban  15 mg Oral BID WC   Followed by   Melene Muller ON 05/30/2023] rivaroxaban  20 mg Oral Q supper   thiamine  100 mg Oral Daily   vitamin A  10,000 Units Oral Daily   Vitamin D (Ergocalciferol)  50,000 Units Oral Q7 days   Vitamin E  400 Units Oral Daily   zinc sulfate  220 mg Oral Daily    Assessment/ Plan:     [name] is a 47 y.o. male with morbid obesity status post bariatric surgery, sleep apnea, hypertension, GERD, bilateral lymphedema, DVT  who presents to [facility] on 04/21/2023 for Severe hypothyroidism [E03.9] Generalized weakness [R53.1] Lower limb ulcer, ankle, right, with unspecified severity (HCC) [L97.319] Hypothyroidism, unspecified type [E03.9] Sepsis, due to unspecified organism, unspecified whether acute organ dysfunction present (HCC) [A41.9]  Patient's hospitalization has been complicated by DVT, pseudomonas bacteremia and acute kidney injury.    #1: Acute kidney injury: secondary to vancomycin toxicity, sepsis and ATN.      #2: Hypertension: Continue metoprolol.   #3: Congestive heart failure: volume status is acceptable. Currently on IV fluids   #4: Hyponatremia: improved.    #5: Metabolic acidosis:  continue bicarb supplements.  #6: Anemia: status post PRBC transfusions. Hemoglobin 8.4    LOS: 21 Lamont Dowdy, MD [facility] 5/14/20247:26 PM

## 2023-05-12 NOTE — Progress Notes (Signed)
PROGRESS NOTE  Christopher Herman  ZOX:[id number] DOB: [birth year] DOA: 04/21/2023 PCP: Pcp, No   Brief Narrative: Patient is a 47 y.o. male with a PMH significant for severe obesity s/p bariatric surgery, OSA, hypoventilation syndrome, HTN, GERD, cholelithiasis, bilateral extremity, chronic lymphedema ,status post IVC filter placement, IDA who presented from home to the ED on 04/21/2023 with generalized weakness, extreme fatigue, unintended weight loss, disuse of upper extremities and poor p.o. intake.Marland Kitchen He is chronically bed bound and malnourished.  Initially septic shock was suspected, admitted to ICU, started on broad-spectrum antibiotics.  Also required vasopressors in the interim but have not been discontinued. Hospital course remarkable for worsening kidney function.  Nephrology following, suspected to have ATN given septic shock.  PT/OT recommending SNF/long-term care.  TOC following.  Palliative care was also consulted for goals of care.  Remains full code.  5/8: DVT study positive 5/11: Febrile, repeat cultures sent, intermittent diarrhea noted.  Resume vancomycin pending cultures.  Initiate Xarelto 5/12: Blood culture with Pseudomonas-vancomycin/ceftriaxone changed to cefepime.  Hemoglobin decreased to 6.5, without overt bleeding. 1 unit of PRBC.   5/13-14: ID consulted for bacteremia with Pseudomonas aeruginosa.  Wound culture pending, likely need PICC pending repeat blood cultures  Assessment & Plan:  Principal Problem:   Severe hypothyroidism Active Problems:   Malnutrition of moderate degree (HCC)   Generalized weakness   Ulcer of right ankle (HCC)   Hypotension due to hypovolemia   Acute renal failure due to tubular necrosis (HCC)   Hypothyroidism  Septic shock secondary to staph bacteremia with repeat culture showing Pseudomonas, POA:  - Febrile, leukocytosis, source of bacteremia requiring pressors meeting criteria for septic shock - Initial blood cultures positive for Staph  capitis, subsequently noted to have Pseudomonas positive blood cultures on repeat check. -Patient now afebrile for 72 hours, leukocytosis has resolved; symptomatically improving -Continue to cefepime, appreciate ID insight and recommendations -Follow repeat cultures, pending sensitivities patient may require PICC line and long-term IV antibiotics  Left lower extremity DVT.   -Noted on ultrasound 5/8  -Started Xarelto 5/11  -No respiratory symptoms, no indication for VQ scan, given patient's bedbound status he remains remarkably high risk for recurrent DVT.  AKI:  Likely ATN in the setting of septic shock, continue bicarb Ultrasound unremarkable, urine output remains appropriate Nephrology consulted, no indication for dialysis  Chronic combined systolic /diastolic CHF:  Echo done on 4/24 showed EF of 35 to 40%, global hypokinesis, grade 1 diastolic dysfunction.   Appears euvolemic, monitor closely in the setting of ongoing IV fluids  Hypokalemia: Improved.  Continue to monitor and replete as needed.  Severe hypothyroidism:  Completed IV Synthroid dosing, continue p.o.  Recommend follow-up thyroid function test in 4 to 6 weeks.  Leukocytosis/thrombocytosis: Resolved, questionably reactive given above.  Anemia of chronic disease:  Status post 3 unit PRBC this hospitalization Low threshold for imaging given newly prescribed Xarelto  Chronic sinus tachycardia:  Chronic problem.  QTc also prolonged.  Avoid QTc prolonging medications.  On low-dose metoprolol  Depression: Continue Remeron  Chronic abdomen pain/diarrhea :He complains of chronic abdominal pain and fear of eating as it precipitates diarrhea.  Continue Creon - appetite/diarrhea and bloating sensations appear to be improving.    Morbid obesity/bedbound status: PT/OT recommending.  Long-term facility.  TOC following.  Goals of care: Morbidly obese patient with multiple medical problems.  History of bariatric surgery, OSA,  obesity hypoventilation syndrome, chronic lymphedema, status post IVC filter placement. bedbound status.  Palliative care was following for  goals of care, remains full code   Nutrition Problem: Moderate Malnutrition Etiology: chronic illness (h/o roux-en-y gastric bypass) Pressure Injury 04/21/23 Ankle Right Stage 2 -  Partial thickness loss of dermis presenting as a shallow open injury with a red, pink wound bed without slough. (Active)  04/21/23 0600  Location: Ankle  Location Orientation: Right  Staging: Stage 2 -  Partial thickness loss of dermis presenting as a shallow open injury with a red, pink wound bed without slough.  Wound Description (Comments):   Present on Admission: Yes  Dressing Type Foam - Lift dressing to assess site every shift 05/11/23 2000    DVT prophylaxis: Rivaroxaban (XARELTO) tablet 15 mg  rivaroxaban (XARELTO) tablet 20 mg     Code Status: Full Code  Family Communication: Discussed with patient Patient status:Inpatient  Patient is from :Home  Anticipated discharge ZO:XWRU vs SNF  Estimated DC date:not sure   Consultants: Nephrology, PCCM   Antimicrobials:  Anti-infectives (From admission, onward)    Start     Dose/Rate Route Frequency Ordered Stop   05/10/23 0600  vancomycin variable dose per unstable renal function (pharmacist dosing)  Status:  Discontinued         Does not apply See admin instructions 05/09/23 1518 05/10/23 0423   05/10/23 0520  vancomycin variable dose per unstable renal function (pharmacist dosing)  Status:  Discontinued         Does not apply See admin instructions 05/10/23 0520 05/10/23 1205   05/10/23 0515  ceFEPIme (MAXIPIME) 2 g in sodium chloride 0.9 % 100 mL IVPB        2 g 200 mL/hr over 30 Minutes Intravenous Every 12 hours 05/10/23 0424     05/09/23 1830  vancomycin (VANCOREADY) IVPB 500 mg/100 mL  Status:  Discontinued        500 mg 100 mL/hr over 60 Minutes Intravenous  Once 05/09/23 1517 05/09/23 1524    05/09/23 1800  cefTRIAXone (ROCEPHIN) 2 g in sodium chloride 0.9 % 100 mL IVPB  Status:  Discontinued        2 g 200 mL/hr over 30 Minutes Intravenous Every 24 hours 05/09/23 1654 05/10/23 0423   05/09/23 1715  vancomycin (VANCOREADY) IVPB 1250 mg/250 mL        1,250 mg 166.7 mL/hr over 90 Minutes Intravenous  Once 05/09/23 1623 05/09/23 1839   05/09/23 1615  vancomycin (VANCOREADY) IVPB 2000 mg/400 mL  Status:  Discontinued        2,000 mg 200 mL/hr over 120 Minutes Intravenous  Once 05/09/23 1517 05/09/23 1524   04/29/23 1200  rifaximin (XIFAXAN) tablet 550 mg        550 mg Oral 3 times daily 04/29/23 1111 05/13/23 0959   04/24/23 1210  vancomycin variable dose per unstable renal function (pharmacist dosing)  Status:  Discontinued         Does not apply See admin instructions 04/24/23 1210 05/01/23 1025   04/22/23 2200  vancomycin (VANCOREADY) IVPB 1750 mg/350 mL  Status:  Discontinued        1,750 mg 175 mL/hr over 120 Minutes Intravenous Every 12 hours 04/22/23 1141 04/24/23 1209   04/22/23 1800  cefTRIAXone (ROCEPHIN) 2 g in sodium chloride 0.9 % 100 mL IVPB  Status:  Discontinued        2 g 200 mL/hr over 30 Minutes Intravenous Daily 04/22/23 1201 04/24/23 1739   04/22/23 1230  vancomycin (VANCOREADY) IVPB 2000 mg/400 mL        2,000  mg 200 mL/hr over 120 Minutes Intravenous  Once 04/22/23 1141 04/22/23 1533   04/21/23 1500  vancomycin (VANCOREADY) IVPB 2000 mg/400 mL  Status:  Discontinued        2,000 mg 200 mL/hr over 120 Minutes Intravenous Every 12 hours 04/21/23 0645 04/21/23 1253   04/21/23 1400  linezolid (ZYVOX) IVPB 600 mg  Status:  Discontinued        600 mg 300 mL/hr over 60 Minutes Intravenous Every 12 hours 04/21/23 1253 04/22/23 1130   04/21/23 0730  piperacillin-tazobactam (ZOSYN) IVPB 3.375 g        3.375 g 12.5 mL/hr over 240 Minutes Intravenous Every 8 hours 04/21/23 0638 04/22/23 1328   04/21/23 0215  ceFEPIme (MAXIPIME) 2 g in sodium chloride 0.9 % 100 mL  IVPB        2 g 200 mL/hr over 30 Minutes Intravenous  Once 04/21/23 0204 04/21/23 0333   04/21/23 0215  vancomycin (VANCOCIN) IVPB 1000 mg/200 mL premix       See Hyperspace for full Linked Orders Report.   1,000 mg 200 mL/hr over 60 Minutes Intravenous  Once 04/21/23 0211 04/21/23 0420   04/21/23 0215  vancomycin (VANCOREADY) IVPB 1500 mg/300 mL       See Hyperspace for full Linked Orders Report.   1,500 mg 150 mL/hr over 120 Minutes Intravenous  Once 04/21/23 0211 04/21/23 0706       Subjective: Patient with no new complaint today.  Objective: Vitals:   05/12/23 0034 05/12/23 0302 05/12/23 0311 05/12/23 0528  BP:  116/84  102/75  Pulse: (!) 105 (!) 112 99 100  Resp: 18 16 17 16   Temp:  98.1 F (36.7 C)  98.2 F (36.8 C)  TempSrc:  Oral  Oral  SpO2: 100% 100% 99% 100%  Weight:      Height:        Intake/Output Summary (Last 24 hours) at 05/12/2023 0733 Last data filed at 05/12/2023 0530 Gross per 24 hour  Intake 600 ml  Output 1550 ml  Net -950 ml    Filed Weights   05/08/23 0702 05/08/23 0825 05/11/23 0804  Weight: (!) 145.6 kg (!) 145.2 kg (!) 169.4 kg    Examination: General.  Morbidly obese gentleman, in no acute distress. Pulmonary.  Lungs clear bilaterally, normal respiratory effort. CV.  Regular rate and rhythm, no JVD, rub or murmur. Abdomen.  Soft, nontender, nondistended, BS positive. CNS.  Alert and oriented .  No focal neurologic deficit. Extremities.  No edema, no cyanosis, pulses intact and symmetrical.  Significant lymphedema Psychiatry.  Judgment and insight appears normal.     Data Reviewed: I have personally reviewed following labs and imaging studies  CBC: Recent Labs  Lab 05/07/23 0500 05/08/23 0800 05/09/23 0612 05/10/23 0546 05/10/23 1004 05/11/23 0641  WBC 12.4* 11.3* 29.8* 15.8*  --  13.2*  NEUTROABS 10.1* 9.6* 28.7* 14.4*  --  11.1*  HGB 8.3* 7.7* 7.3* 6.5* 7.6* 7.9*  HCT 25.3* 23.8* 22.7* 19.6* 22.6* 23.5*  MCV 86.3  85.9 87.3 86.7  --  84.8  PLT 237 239 197 168  --  207    Basic Metabolic Panel: Recent Labs  Lab 05/07/23 0500 05/08/23 0800 05/09/23 0612 05/10/23 0546 05/11/23 0641  NA 134* 134* 134* 136 137  K 3.6 3.4* 3.4* 3.5 3.4*  CL 109 110 111 110 110  CO2 14* 15* 16* 17* 18*  GLUCOSE 89 96 115* 104* 89  BUN 65* 63* 63* 62* 61*  CREATININE 2.74* 3.13* 3.03* 3.03* 3.05*  CALCIUM 8.4* 8.1* 8.1* 8.1* 8.2*  PHOS 3.9 4.1 3.3 3.5 3.7      Recent Results (from the past 240 hour(s))  Culture, blood (Routine X 2) w Reflex to ID Panel     Status: Abnormal (Preliminary result)   Collection Time: 05/09/23  9:09 AM   Specimen: BLOOD  Result Value Ref Range Status   Specimen Description   Final    BLOOD A-LINE Performed at [facility], [address] [place], [place] [address]    Special Requests   Final    BOTTLES DRAWN AEROBIC AND ANAEROBIC AEROMONAS CAVIAE Performed at [facility], [address] [place], [place] [address]    Culture  Setup Time   Final    GRAM NEGATIVE RODS IN BOTH AEROBIC AND ANAEROBIC BOTTLES CRITICAL RESULT CALLED TO, READ BACK BY AND VERIFIED WITH: JASON ROBINS @0420  05/10/23 [facility]    Culture (A)  Final    PSEUDOMONAS AERUGINOSA SUSCEPTIBILITIES TO FOLLOW Performed at [facility], [address] [place], [place] [address]    Report Status PENDING  Incomplete  Culture, blood (Routine X 2) w Reflex to ID Panel     Status: None (Preliminary result)   Collection Time: 05/09/23  9:09 AM   Specimen: BLOOD LEFT ARM  Result Value Ref Range Status   Specimen Description BLOOD LEFT ARM  Final   Special Requests   Final    BOTTLES DRAWN AEROBIC ONLY Blood Culture adequate volume   Culture   Final    NO GROWTH 2 DAYS Performed at [facility], [address] [place], [place] [address]    Report Status PENDING  Incomplete  Blood Culture ID Panel (Reflexed)     Status: Abnormal   Collection Time: 05/09/23  9:09 AM  Result Value  Ref Range Status   Enterococcus faecalis NOT DETECTED NOT DETECTED Final   Enterococcus Faecium NOT DETECTED NOT DETECTED Final   Listeria monocytogenes NOT DETECTED NOT DETECTED Final   Staphylococcus species NOT DETECTED NOT DETECTED Final   Staphylococcus aureus (BCID) NOT DETECTED NOT DETECTED Final   Staphylococcus epidermidis NOT DETECTED NOT DETECTED Final   Staphylococcus lugdunensis NOT DETECTED NOT DETECTED Final   Streptococcus species NOT DETECTED NOT DETECTED Final   Streptococcus agalactiae NOT DETECTED NOT DETECTED Final   Streptococcus pneumoniae NOT DETECTED NOT DETECTED Final   Streptococcus pyogenes NOT DETECTED NOT DETECTED Final   A.calcoaceticus-baumannii NOT DETECTED NOT DETECTED Final   Bacteroides fragilis NOT DETECTED NOT DETECTED Final   Enterobacterales NOT DETECTED NOT DETECTED Final   Enterobacter cloacae complex NOT DETECTED NOT DETECTED Final   Escherichia coli NOT DETECTED NOT DETECTED Final   Klebsiella aerogenes NOT DETECTED NOT DETECTED Final   Klebsiella oxytoca NOT DETECTED NOT DETECTED Final   Klebsiella pneumoniae NOT DETECTED NOT DETECTED Final   Proteus species NOT DETECTED NOT DETECTED Final   Salmonella species NOT DETECTED NOT DETECTED Final   Serratia marcescens NOT DETECTED NOT DETECTED Final   Haemophilus influenzae NOT DETECTED NOT DETECTED Final   Neisseria meningitidis NOT DETECTED NOT DETECTED Final   Pseudomonas aeruginosa DETECTED (A) NOT DETECTED Final    Comment: CRITICAL RESULT CALLED TO, READ BACK BY AND VERIFIED WITH: JASON ROBINS @ 0420 05/10/23 [facility]    Stenotrophomonas maltophilia NOT DETECTED NOT DETECTED Final   Candida albicans NOT DETECTED NOT DETECTED Final   Candida auris NOT DETECTED NOT DETECTED Final   Candida glabrata  NOT DETECTED NOT DETECTED Final   Candida krusei NOT DETECTED NOT DETECTED Final   Candida parapsilosis NOT DETECTED NOT DETECTED Final   Candida tropicalis NOT DETECTED NOT DETECTED Final    Cryptococcus neoformans/gattii NOT DETECTED NOT DETECTED Final   CTX-M ESBL NOT DETECTED NOT DETECTED Final   Carbapenem resistance IMP NOT DETECTED NOT DETECTED Final   Carbapenem resistance KPC NOT DETECTED NOT DETECTED Final   Carbapenem resistance NDM NOT DETECTED NOT DETECTED Final   Carbapenem resistance VIM NOT DETECTED NOT DETECTED Final    Comment: Performed at [facility], [address] [place], [place] [address]  Aerobic Culture w Gram Stain (superficial specimen)     Status: None (Preliminary result)   Collection Time: 05/11/23  2:25 PM   Specimen: Thigh; Wound  Result Value Ref Range Status   Specimen Description   Final    THIGH Performed at [facility], [address] [place], [place] [address]    Special Requests   Final    LEFT THIGH Performed at [facility], [address] [place], [place] [address]    Gram Stain   Final    NO WBC SEEN NO ORGANISMS SEEN Performed at [facility], [address] [place], [place] [address]    Culture PENDING  Incomplete   Report Status PENDING  Incomplete  Aerobic Culture w Gram Stain (superficial specimen)     Status: None (Preliminary result)   Collection Time: 05/11/23  2:30 PM   Specimen: Back; Wound  Result Value Ref Range Status   Specimen Description   Final    BACK Performed at [facility], [address], [place], [place] [address]    Special Requests   Final    BACK Performed at [facility], [address] [place], [place] [address]    Gram Stain   Final    NO WBC SEEN NO ORGANISMS SEEN Performed at [facility], [address], [place], [place] [address]    Culture PENDING  Incomplete   Report Status PENDING  Incomplete     Radiology Studies: No results found.  Scheduled Meds:  vitamin C  500 mg Oral BID   Chlorhexidine Gluconate Cloth  6 each Topical Daily   copper  8 mg Oral Daily   folic acid  1 mg Oral Daily   insulin aspart   0-15 Units Subcutaneous TID WC   lactase  6,000 Units Oral TID WC   levothyroxine  100 mcg Oral Q0600   lipase/protease/amylase  24,000 Units Oral TID AC   metoprolol succinate  12.5 mg Oral Daily   mirtazapine  15 mg Oral QHS   multivitamin with minerals  1 tablet Oral BID   pantoprazole  40 mg Oral QHS   rifaximin  550 mg Oral TID   Rivaroxaban  15 mg Oral BID WC   Followed by   Melene Muller ON 05/30/2023] rivaroxaban  20 mg Oral Q supper   thiamine  100 mg Oral Daily   vitamin A  10,000 Units Oral Daily   Vitamin D (Ergocalciferol)  50,000 Units Oral Q7 days   Vitamin E  400 Units Oral Daily   zinc sulfate  220 mg Oral Daily   Continuous Infusions:  ceFEPime (MAXIPIME) IV 2 g (05/12/23 0518)   sodium bicarbonate 150 mEq in sterile water 1,150 mL infusion 50 mL/hr at 05/12/23 0519     LOS: 21 days   Azucena Fallen, DO  [facility] P5/14/2024, 7:33 AM

## 2023-05-12 NOTE — Progress Notes (Signed)
Date of Admission:  04/21/2023     ID: Christopher Herman is a 47 y.o. male Principal Problem:   Severe hypothyroidism Active Problems:   Malnutrition of moderate degree (HCC)   Generalized weakness   Ulcer of right ankle (HCC)   Hypotension due to hypovolemia   Acute renal failure due to tubular necrosis (HCC)   Hypothyroidism    Subjective: Patient is feeling okay A lot of clear drainage from the left thigh wound Mother at bedside  Medications:   vitamin C  500 mg Oral BID   Chlorhexidine Gluconate Cloth  6 each Topical Daily   copper  8 mg Oral Daily   folic acid  1 mg Oral Daily   insulin aspart  0-15 Units Subcutaneous TID WC   lactase  6,000 Units Oral TID WC   levothyroxine  100 mcg Oral Q0600   lipase/protease/amylase  24,000 Units Oral TID AC   metoprolol succinate  12.5 mg Oral Daily   mirtazapine  15 mg Oral QHS   multivitamin with minerals  1 tablet Oral BID   pantoprazole  40 mg Oral QHS   rifaximin  550 mg Oral TID   Rivaroxaban  15 mg Oral BID WC   Followed by   Melene Muller ON 05/30/2023] rivaroxaban  20 mg Oral Q supper   thiamine  100 mg Oral Daily   vitamin A  10,000 Units Oral Daily   Vitamin D (Ergocalciferol)  50,000 Units Oral Q7 days   Vitamin E  400 Units Oral Daily   zinc sulfate  220 mg Oral Daily    Objective: Vital signs in last 24 hours: Patient Vitals for the past 24 hrs:  BP Temp Temp src Pulse Resp SpO2  05/12/23 1301 108/72 98.3 F (36.8 C) Oral (!) 102 16 100 %  05/12/23 0848 (!) 113/92 (!) 97.5 F (36.4 C) Oral 100 15 100 %  05/12/23 0800 (!) 115/96 -- -- (!) 111 18 100 %  05/12/23 0528 102/75 98.2 F (36.8 C) Oral 100 16 100 %  05/12/23 0311 -- -- -- 99 17 99 %  05/12/23 0302 116/84 98.1 F (36.7 C) Oral (!) 112 16 100 %  05/12/23 0034 -- -- -- (!) 105 18 100 %  05/12/23 0022 110/78 98.2 F (36.8 C) Oral (!) 111 16 100 %  05/11/23 1955 113/79 97.9 F (36.6 C) Oral (!) 106 16 100 %  05/11/23 1617 -- 98.4 F (36.9 C) Oral -- --  --  05/11/23 1600 100/80 -- -- -- 14 99 %      PHYSICAL EXAM:  General: Alert, cooperative, no distress, appears stated age.  Morbid ECT Head: Normocephalic, without obvious abnormality, atraumatic. Eyes: Conjunctivae clear, anicteric sclerae. Pupils are equal ENT Nares normal. No drainage or sinus tenderness. Lips, mucosa, and tongue normal. No Thrush Neck: Supple, symmetrical, no adenopathy, thyroid: non tender no carotid bruit and no JVD. Back: No CVA tenderness. Lungs: Clear to auscultation bilaterally. No Wheezing or Rhonchi. No rales. Heart: Regular rate and rhythm, no murmur, rub or gallop. Abdomen: Soft, non-tender,not distended. Bowel sounds normal. No masses Extremities: Lymphedema legs         Skin: No rashes or lesions. Or bruising Lymph: Cervical, supraclavicular normal. Neurologic: Grossly non-focal  Lab Results    Latest Ref Rng & Units 05/12/2023    7:55 AM 05/11/2023    6:41 AM 05/10/2023   10:04 AM  CBC  WBC 4.0 - 10.5 K/uL 10.4  13.2  Hemoglobin 13.0 - 17.0 g/dL 8.4  7.9  7.6   Hematocrit 39.0 - 52.0 % 25.6  23.5  22.6   Platelets 150 - 400 K/uL 223  207         Latest Ref Rng & Units 05/11/2023    6:41 AM 05/10/2023    5:46 AM 05/09/2023    6:12 AM  CMP  Glucose 70 - 99 mg/dL 89  161  096   BUN 6 - 20 mg/dL 61  62  63   Creatinine 0.61 - 1.24 mg/dL 0.45  4.09  8.11   Sodium 135 - 145 mmol/L 137  136  134   Potassium 3.5 - 5.1 mmol/L 3.4  3.5  3.4   Chloride 98 - 111 mmol/L 110  110  111   CO2 22 - 32 mmol/L 18  17  16    Calcium 8.9 - 10.3 mg/dL 8.2  8.1  8.1       Microbiology: Wound culture gram-negative rods Studies/Results: No results found.   Assessment/Plan: pseudomonas bacteremia 2 weeks into hospitalization- has multiple wounds-culture has been sent.  And looks like the preliminary report is Pseudomonas.  This could be the source of his bacteremia   Also had a central line which has been removed  Fever and leucocytosis much  improved As this is pneumonia is resistant to cefepime changed to meropenem   Severe hypothyroidism- new diagnosis and is on on  synthroid On admission was hypothermic, hyponatremic thought to be due to above   Shock- sepsis VS SIR - resolved   Worsening AKI- creatinine plateud at 3.05   Severe obesity   Gastric bypass   Anemia   Prolonged clearance of vanco, with increase in levels even after the antibiotic was stopped for nearly 2 weeks     Discussed the management with the patient and his mother at bedside

## 2023-05-13 DIAGNOSIS — I890 Lymphedema, not elsewhere classified: Secondary | ICD-10-CM | POA: Diagnosis not present

## 2023-05-13 DIAGNOSIS — E039 Hypothyroidism, unspecified: Secondary | ICD-10-CM | POA: Diagnosis not present

## 2023-05-13 DIAGNOSIS — B965 Pseudomonas (aeruginosa) (mallei) (pseudomallei) as the cause of diseases classified elsewhere: Secondary | ICD-10-CM | POA: Diagnosis not present

## 2023-05-13 DIAGNOSIS — R7881 Bacteremia: Secondary | ICD-10-CM | POA: Diagnosis not present

## 2023-05-13 LAB — CBC WITH DIFFERENTIAL/PLATELET
Abs Immature Granulocytes: 0.16 10*3/uL — ABNORMAL HIGH (ref 0.00–0.07)
Basophils Absolute: 0 10*3/uL (ref 0.0–0.1)
Basophils Relative: 0 %
Eosinophils Absolute: 0.3 10*3/uL (ref 0.0–0.5)
Eosinophils Relative: 3 %
HCT: 25 % — ABNORMAL LOW (ref 39.0–52.0)
Hemoglobin: 8.4 g/dL — ABNORMAL LOW (ref 13.0–17.0)
Immature Granulocytes: 2 %
Lymphocytes Relative: 9 %
Lymphs Abs: 1 10*3/uL (ref 0.7–4.0)
MCH: 28.6 pg (ref 26.0–34.0)
MCHC: 33.6 g/dL (ref 30.0–36.0)
MCV: 85 fL (ref 80.0–100.0)
Monocytes Absolute: 0.7 10*3/uL (ref 0.1–1.0)
Monocytes Relative: 7 %
Neutro Abs: 8.1 10*3/uL — ABNORMAL HIGH (ref 1.7–7.7)
Neutrophils Relative %: 79 %
Platelets: 251 10*3/uL (ref 150–400)
RBC: 2.94 MIL/uL — ABNORMAL LOW (ref 4.22–5.81)
RDW: 19.4 % — ABNORMAL HIGH (ref 11.5–15.5)
WBC: 10.2 10*3/uL (ref 4.0–10.5)
nRBC: 0 % (ref 0.0–0.2)

## 2023-05-13 LAB — BASIC METABOLIC PANEL
Anion gap: 9 (ref 5–15)
BUN: 60 mg/dL — ABNORMAL HIGH (ref 6–20)
CO2: 20 mmol/L — ABNORMAL LOW (ref 22–32)
Calcium: 8 mg/dL — ABNORMAL LOW (ref 8.9–10.3)
Chloride: 107 mmol/L (ref 98–111)
Creatinine, Ser: 2.7 mg/dL — ABNORMAL HIGH (ref 0.61–1.24)
GFR, Estimated: 29 mL/min — ABNORMAL LOW (ref >60)
Glucose, Bld: 84 mg/dL (ref 70–99)
Potassium: 3.2 mmol/L — ABNORMAL LOW (ref 3.5–5.1)
Sodium: 136 mmol/L (ref 135–145)

## 2023-05-13 LAB — AEROBIC CULTURE W GRAM STAIN (SUPERFICIAL SPECIMEN): Gram Stain: NONE SEEN

## 2023-05-13 LAB — CULTURE, BLOOD (ROUTINE X 2): Special Requests: ADEQUATE

## 2023-05-13 LAB — PHOSPHORUS: Phosphorus: 3 mg/dL (ref 2.5–4.6)

## 2023-05-13 MED ORDER — HYDROCERIN EX CREA
TOPICAL_CREAM | Freq: Two times a day (BID) | CUTANEOUS | Status: DC
  Administered 2023-05-22: 1 via TOPICAL
  Filled 2023-05-13 (×2): qty 113

## 2023-05-13 MED ORDER — DIPHENHYDRAMINE HCL 25 MG PO CAPS
25.0000 mg | ORAL_CAPSULE | Freq: Four times a day (QID) | ORAL | Status: DC | PRN
  Administered 2023-05-13 – 2023-05-22 (×15): 25 mg via ORAL
  Filled 2023-05-13 (×16): qty 1

## 2023-05-13 MED ORDER — SODIUM CHLORIDE 0.9 % IV SOLN
1.0000 g | Freq: Three times a day (TID) | INTRAVENOUS | Status: DC
  Administered 2023-05-13 – 2023-05-23 (×31): 1 g via INTRAVENOUS
  Filled 2023-05-13 (×33): qty 20

## 2023-05-13 MED ORDER — POTASSIUM CHLORIDE CRYS ER 20 MEQ PO TBCR
40.0000 meq | EXTENDED_RELEASE_TABLET | Freq: Once | ORAL | Status: AC
  Administered 2023-05-13: 40 meq via ORAL
  Filled 2023-05-13: qty 2

## 2023-05-13 MED ORDER — SACCHAROMYCES BOULARDII 250 MG PO CAPS
250.0000 mg | ORAL_CAPSULE | Freq: Two times a day (BID) | ORAL | Status: DC
  Administered 2023-05-13 – 2023-05-23 (×20): 250 mg via ORAL
  Filled 2023-05-13 (×22): qty 1

## 2023-05-13 NOTE — Progress Notes (Signed)
PROGRESS NOTE    Christopher Herman  XBJ:[id number] DOB: July 31, 1976 DOA: 04/21/2023 PCP: Pcp, No    Brief Narrative:  Patient is a 47 y.o. male with a PMH significant for severe obesity s/p bariatric surgery, OSA, hypoventilation syndrome, HTN, GERD, cholelithiasis, bilateral extremity, chronic lymphedema ,status post IVC filter placement, IDA who presented from home to the ED on 04/21/2023 with generalized weakness, extreme fatigue, unintended weight loss, disuse of upper extremities and poor p.o. intake.Marland Kitchen He is chronically bed bound and malnourished.  Initially septic shock was suspected, admitted to ICU, started on broad-spectrum antibiotics.  Also required vasopressors in the interim but have not been discontinued. Hospital course remarkable for worsening kidney function.  Nephrology following, suspected to have ATN given septic shock.  PT/OT recommending SNF/long-term care.  TOC following.  Palliative care was also consulted for goals of care.  Remains full code.   5/8: DVT study positive 5/11: Febrile, repeat cultures sent, intermittent diarrhea noted.  Resume vancomycin pending cultures.  Initiate Xarelto 5/12: Blood culture with Pseudomonas-vancomycin/ceftriaxone changed to cefepime.  Hemoglobin decreased to 6.5, without overt bleeding. 1 unit of PRBC.   5/13-14: ID consulted for bacteremia with Pseudomonas aeruginosa.  5/15: Remains on meropenem for Pseudomonas bacteremia   Assessment & Plan:   Principal Problem:   Severe hypothyroidism Active Problems:   Malnutrition of moderate degree (HCC)   Generalized weakness   Ulcer of right ankle (HCC)   Hypotension due to hypovolemia   Acute renal failure due to tubular necrosis (HCC)   Hypothyroidism   Multiple wounds   Bacteremia due to Pseudomonas   Lymphedema associated with obesity  Septic shock secondary to staph bacteremia with repeat culture showing Pseudomonas, POA:  Shock physiology as long resolved.  Patient was previously on  midodrine and stress dose steroids for blood pressure support.  These have been weaned off.  Initial blood cultures positive for Staphylococcus capitis.  Subsequently patient developed recurrent fevers.  Repeat blood culture positive for Pseudomonas.  Source appears to be multiple skin infections.  Cultured by ID. Plan: Continue meropenem with pharmacy dosing assistance.  Infectious disease following.  Monitor vitals and fever curve  Left lower extremity DVT.   -Noted on ultrasound 5/8  -Started Xarelto 5/11  -No respiratory symptoms, no indication for VQ scan, given patient's bedbound status he remains remarkably high risk for recurrent DVT.   AKI:  Likely ATN in the setting of septic shock, continue bicarb IV GTT Ultrasound unremarkable, urine output remains appropriate Nephrology consulted, no indication for dialysis Creatinine improving on 5/15   Chronic combined systolic /diastolic CHF:  Echo done on 4/24 showed EF of 35 to 40%, global hypokinesis, grade 1 diastolic dysfunction.   Appears euvolemic, monitor closely in the setting of ongoing IV fluids   Hypokalemia: Improved.  Continue to monitor and replete as needed.   Severe hypothyroidism:  Completed IV Synthroid dosing, continue p.o.  Recommend follow-up thyroid function test in 4 to 6 weeks.   Leukocytosis/thrombocytosis: Resolved, questionably reactive given above.   Anemia of chronic disease:  Status post 3 unit PRBC this hospitalization Low threshold for imaging given newly prescribed Xarelto   Chronic sinus tachycardia:  Chronic problem.  QTc also prolonged.  Avoid QTc prolonging medications.  On low-dose metoprolol   Depression: Continue Remeron   Chronic abdomen pain/diarrhea :He complains of chronic abdominal pain and fear of eating as it precipitates diarrhea.  Continue Creon - appetite/diarrhea and bloating sensations appear to be improving.     Morbid  obesity/bedbound status: PT/OT recommending.  Long-term  facility.  TOC following.   Goals of care: Morbidly obese patient with multiple medical problems.  History of bariatric surgery, OSA, obesity hypoventilation syndrome, chronic lymphedema, status post IVC filter placement. bedbound status.  Palliative care was following for goals of care, remains full code.  Will need long-term care facility.  DVT prophylaxis: Xarelto Code Status: Full Family Communication: None Disposition Plan: Status is: Inpatient Remains inpatient appropriate because: Pseudomonas bacteremia on IV antibiotics   Level of care: Progressive  Consultants:  Nephrology ID  Procedures:  None  Antimicrobials: Meropenem   Subjective: Seen and examined.  Voice stronger.  Stating he is eating more.  No complaints of abdominal pain this morning.  Objective: Vitals:   05/13/23 0400 05/13/23 0522 05/13/23 0852 05/13/23 1227  BP: 112/74 109/81 122/86 111/82  Pulse: (!) 101 (!) 106 (!) 107 100  Resp: 15 18 16 18   Temp: 98 F (36.7 C) 97.8 F (36.6 C) 98.2 F (36.8 C) 98.1 F (36.7 C)  TempSrc: Oral Oral Oral Oral  SpO2: 91% 100% 100% 98%  Weight:  (!) 174.6 kg    Height:        Intake/Output Summary (Last 24 hours) at 05/13/2023 1405 Last data filed at 05/13/2023 1222 Gross per 24 hour  Intake 2522.24 ml  Output 850 ml  Net 1672.24 ml   Filed Weights   05/08/23 0825 05/11/23 0804 05/13/23 0522  Weight: (!) 145.2 kg (!) 169.4 kg (!) 174.6 kg    Examination:  General exam: NAD.  Appears chronically ill Respiratory system: Poor respiratory effort.  Lung sounds decreased at bases.  Normal work of breathing.  Room air Cardiovascular system: Tachycardic, regular rhythm, no murmurs, lymphedema bilateral lower extremities Gastrointestinal system: Obese, soft, NT/ND Central nervous system: Alert and oriented. No focal neurological deficits. Extremities: Markedly decreased power bilateral upper and lower extremities Skin: Multiple areas of skin lesions and  breakdown in various levels of healing Psychiatry: Judgement and insight appear normal. Mood & affect appropriate.     Data Reviewed: I have personally reviewed following labs and imaging studies  CBC: Recent Labs  Lab 05/09/23 0612 05/10/23 0546 05/10/23 1004 05/11/23 0641 05/12/23 0755 05/13/23 0859  WBC 29.8* 15.8*  --  13.2* 10.4 10.2  NEUTROABS 28.7* 14.4*  --  11.1* 9.0* 8.1*  HGB 7.3* 6.5* 7.6* 7.9* 8.4* 8.4*  HCT 22.7* 19.6* 22.6* 23.5* 25.6* 25.0*  MCV 87.3 86.7  --  84.8 85.9 85.0  PLT 197 168  --  207 223 251   Basic Metabolic Panel: Recent Labs  Lab 05/08/23 0800 05/09/23 0612 05/10/23 0546 05/11/23 0641 05/12/23 0755 05/13/23 0859  NA 134* 134* 136 137  --  136  K 3.4* 3.4* 3.5 3.4*  --  3.2*  CL 110 111 110 110  --  107  CO2 15* 16* 17* 18*  --  20*  GLUCOSE 96 115* 104* 89  --  84  BUN 63* 63* 62* 61*  --  60*  CREATININE 3.13* 3.03* 3.03* 3.05*  --  2.70*  CALCIUM 8.1* 8.1* 8.1* 8.2*  --  8.0*  PHOS 4.1 3.3 3.5 3.7 3.1 3.0   GFR: Estimated Creatinine Clearance: 52.1 mL/min (A) (by C-G formula based on SCr of 2.7 mg/dL (H)). Liver Function Tests: No results for input(s): "AST", "ALT", "ALKPHOS", "BILITOT", "PROT", "ALBUMIN" in the last 168 hours. No results for input(s): "LIPASE", "AMYLASE" in the last 168 hours. No results for input(s): "AMMONIA" in  the last 168 hours. Coagulation Profile: No results for input(s): "INR", "PROTIME" in the last 168 hours. Cardiac Enzymes: No results for input(s): "CKTOTAL", "CKMB", "CKMBINDEX", "TROPONINI" in the last 168 hours. BNP (last 3 results) No results for input(s): "PROBNP" in the last 8760 hours. HbA1C: No results for input(s): "HGBA1C" in the last 72 hours. CBG: Recent Labs  Lab 05/11/23 0951 05/11/23 1139 05/11/23 1619 05/12/23 0656 05/12/23 1205  GLUCAP 86 83 75 96 87   Lipid Profile: No results for input(s): "CHOL", "HDL", "LDLCALC", "TRIG", "CHOLHDL", "LDLDIRECT" in the last 72  hours. Thyroid Function Tests: No results for input(s): "TSH", "T4TOTAL", "FREET4", "T3FREE", "THYROIDAB" in the last 72 hours. Anemia Panel: No results for input(s): "VITAMINB12", "FOLATE", "FERRITIN", "TIBC", "IRON", "RETICCTPCT" in the last 72 hours. Sepsis Labs: Recent Labs  Lab 05/09/23 0700 05/09/23 0755  LATICACIDVEN 0.8 0.7    Recent Results (from the past 240 hour(s))  Culture, blood (Routine X 2) w Reflex to ID Panel     Status: Abnormal   Collection Time: 05/09/23  9:09 AM   Specimen: BLOOD  Result Value Ref Range Status   Specimen Description   Final    BLOOD A-LINE Performed at [facility], [address], [place], [place] [address]    Special Requests   Final    BOTTLES DRAWN AEROBIC AND ANAEROBIC AEROMONAS CAVIAE Performed at [facility], [address] [place], [place] [address]    Culture  Setup Time   Final    GRAM NEGATIVE RODS IN BOTH AEROBIC AND ANAEROBIC BOTTLES CRITICAL RESULT CALLED TO, READ BACK BY AND VERIFIED WITH: JASON ROBINS @0420  05/10/23 [facility]    Culture PSEUDOMONAS AERUGINOSA (A)  Final   Report Status 05/12/2023 FINAL  Final   Organism ID, Bacteria PSEUDOMONAS AERUGINOSA  Final      Susceptibility   Pseudomonas aeruginosa - MIC*    CEFTAZIDIME 8 SENSITIVE Sensitive     CIPROFLOXACIN 1 INTERMEDIATE Intermediate     GENTAMICIN <=1 SENSITIVE Sensitive     IMIPENEM 2 SENSITIVE Sensitive     * PSEUDOMONAS AERUGINOSA  Culture, blood (Routine X 2) w Reflex to ID Panel     Status: None (Preliminary result)   Collection Time: 05/09/23  9:09 AM   Specimen: BLOOD LEFT ARM  Result Value Ref Range Status   Specimen Description BLOOD LEFT ARM  Final   Special Requests   Final    BOTTLES DRAWN AEROBIC ONLY Blood Culture adequate volume   Culture   Final    NO GROWTH 4 DAYS Performed at [facility], [address] [place], [place] [address]    Report Status PENDING  Incomplete  Blood Culture ID Panel (Reflexed)      Status: Abnormal   Collection Time: 05/09/23  9:09 AM  Result Value Ref Range Status   Enterococcus faecalis NOT DETECTED NOT DETECTED Final   Enterococcus Faecium NOT DETECTED NOT DETECTED Final   Listeria monocytogenes NOT DETECTED NOT DETECTED Final   Staphylococcus species NOT DETECTED NOT DETECTED Final   Staphylococcus aureus (BCID) NOT DETECTED NOT DETECTED Final   Staphylococcus epidermidis NOT DETECTED NOT DETECTED Final   Staphylococcus lugdunensis NOT DETECTED NOT DETECTED Final   Streptococcus species NOT DETECTED NOT DETECTED Final   Streptococcus agalactiae NOT DETECTED NOT DETECTED Final   Streptococcus pneumoniae NOT DETECTED NOT DETECTED Final   Streptococcus pyogenes NOT DETECTED NOT DETECTED Final   A.calcoaceticus-baumannii NOT DETECTED NOT DETECTED Final   Bacteroides fragilis NOT DETECTED  NOT DETECTED Final   Enterobacterales NOT DETECTED NOT DETECTED Final   Enterobacter cloacae complex NOT DETECTED NOT DETECTED Final   Escherichia coli NOT DETECTED NOT DETECTED Final   Klebsiella aerogenes NOT DETECTED NOT DETECTED Final   Klebsiella oxytoca NOT DETECTED NOT DETECTED Final   Klebsiella pneumoniae NOT DETECTED NOT DETECTED Final   Proteus species NOT DETECTED NOT DETECTED Final   Salmonella species NOT DETECTED NOT DETECTED Final   Serratia marcescens NOT DETECTED NOT DETECTED Final   Haemophilus influenzae NOT DETECTED NOT DETECTED Final   Neisseria meningitidis NOT DETECTED NOT DETECTED Final   Pseudomonas aeruginosa DETECTED (A) NOT DETECTED Final    Comment: CRITICAL RESULT CALLED TO, READ BACK BY AND VERIFIED WITH: JASON ROBINS @ 0420 05/10/23 [facility]    Stenotrophomonas maltophilia NOT DETECTED NOT DETECTED Final   Candida albicans NOT DETECTED NOT DETECTED Final   Candida auris NOT DETECTED NOT DETECTED Final   Candida glabrata NOT DETECTED NOT DETECTED Final   Candida krusei NOT DETECTED NOT DETECTED Final   Candida parapsilosis NOT DETECTED NOT DETECTED  Final   Candida tropicalis NOT DETECTED NOT DETECTED Final   Cryptococcus neoformans/gattii NOT DETECTED NOT DETECTED Final   CTX-M ESBL NOT DETECTED NOT DETECTED Final   Carbapenem resistance IMP NOT DETECTED NOT DETECTED Final   Carbapenem resistance KPC NOT DETECTED NOT DETECTED Final   Carbapenem resistance NDM NOT DETECTED NOT DETECTED Final   Carbapenem resistance VIM NOT DETECTED NOT DETECTED Final    Comment: Performed at [facility], [address] [place], [place] [address]  Culture, blood (Routine X 2) w Reflex to ID Panel     Status: None (Preliminary result)   Collection Time: 05/11/23 11:08 AM   Specimen: BLOOD  Result Value Ref Range Status   Specimen Description BLOOD LEFT [facility]  Final   Special Requests   Final    BOTTLES DRAWN AEROBIC AND ANAEROBIC Blood Culture adequate volume   Culture   Final    NO GROWTH 2 DAYS Performed at [facility], [address] [place], [place] [address]    Report Status PENDING  Incomplete  Culture, blood (Routine X 2) w Reflex to ID Panel     Status: None (Preliminary result)   Collection Time: 05/11/23 11:08 AM   Specimen: BLOOD  Result Value Ref Range Status   Specimen Description BLOOD LEFT WRIST  Final   Special Requests   Final    BOTTLES DRAWN AEROBIC AND ANAEROBIC Blood Culture adequate volume   Culture   Final    NO GROWTH 2 DAYS Performed at [facility], [address] [place], [place] [address]    Report Status PENDING  Incomplete  Aerobic Culture w Gram Stain (superficial specimen)     Status: None (Preliminary result)   Collection Time: 05/11/23  2:25 PM   Specimen: Thigh; Wound  Result Value Ref Range Status   Specimen Description   Final    THIGH Performed at [facility], [address] [place], [place] [address]    Special Requests   Final    LEFT THIGH Performed at [facility], [address] [place], [place] [address]    Gram Stain NO WBC SEEN NO  ORGANISMS SEEN   Final   Culture   Final    RARE PSEUDOMONAS AERUGINOSA SUSCEPTIBILITIES TO FOLLOW Performed at [facility], [address], [place], [place] [address]    Report Status PENDING  Incomplete  Aerobic  Culture w Gram Stain (superficial specimen)     Status: None (Preliminary result)   Collection Time: 05/11/23  2:30 PM   Specimen: Back; Wound  Result Value Ref Range Status   Specimen Description   Final    BACK Performed at [facility], [address] [place], [place] [address]    Special Requests   Final    BACK Performed at [facility], [address] [place], [place] [address]    Gram Stain NO WBC SEEN NO ORGANISMS SEEN   Final   Culture   Final    FEW PSEUDOMONAS AERUGINOSA FEW ACINETOBACTER BAUMANNII SUSCEPTIBILITIES TO FOLLOW CULTURE REINCUBATED FOR BETTER GROWTH Performed at [facility], [address], [place], [place] [address]    Report Status PENDING  Incomplete         Radiology Studies: No results found.      Scheduled Meds:  vitamin C  500 mg Oral BID   Chlorhexidine Gluconate Cloth  6 each Topical Daily   copper  8 mg Oral Daily   folic acid  1 mg Oral Daily   hydrocerin   Topical BID   lactase  6,000 Units Oral TID WC   levothyroxine  100 mcg Oral Q0600   lipase/protease/amylase  24,000 Units Oral TID AC   metoprolol succinate  12.5 mg Oral Daily   mirtazapine  15 mg Oral QHS   multivitamin with minerals  1 tablet Oral BID   pantoprazole  40 mg Oral QHS   Rivaroxaban  15 mg Oral BID WC   Followed by   Melene Muller ON 05/30/2023] rivaroxaban  20 mg Oral Q supper   thiamine  100 mg Oral Daily   vitamin A  10,000 Units Oral Daily   Vitamin D (Ergocalciferol)  50,000 Units Oral Q7 days   Vitamin E  400 Units Oral Daily   zinc sulfate  220 mg Oral Daily   Continuous Infusions:  meropenem (MERREM) IV     sodium bicarbonate 150 mEq in sterile water 1,150 mL infusion 50 mL/hr at 05/13/23 1233      LOS: 22 days     Tresa Moore, MD [facility]   If 7PM-7AM, please contact night-coverage  05/13/2023, 2:05 PM

## 2023-05-13 NOTE — Progress Notes (Signed)
PHARMACY NOTE:  ANTIMICROBIAL RENAL DOSAGE ADJUSTMENT  Current antimicrobial regimen includes a mismatch between antimicrobial dosage and estimated renal function.  As per policy approved by the Pharmacy & Therapeutics and Medical Executive Committees, the antimicrobial dosage will be adjusted accordingly.  Current antimicrobial dosage:  meropenem 1gm IV q12h  Indication: Pseudomonas bacteremia  Renal Function:  Estimated Creatinine Clearance: 52.1 mL/min (A) (by C-G formula based on SCr of 2.7 mg/dL (H)). []      On intermittent HD, scheduled: []      On CRRT    Antimicrobial dosage has been changed to:  meropenem 1gm IV q8h  Additional comments: based on obesity would consider meropenem 2gm IV q8h but based on the time it took him to clear vancomycin, SCr does not appear to accurately represent his actual renal function.     Thank you for allowing pharmacy to be a part of this patient's care.  Juliette Alcide, PharmD, BCPS, BCIDP Work Cell: [phone] 05/13/2023 10:08 AM

## 2023-05-13 NOTE — Progress Notes (Signed)
Occupational Therapy Treatment Patient Details Name: Christopher Herman MRN: [id number] DOB: 1976/11/18 Today's Date: 05/13/2023   History of present illness 47 y/o male presented to ED on 04/21/23 for weakness, extreme fatigue, unintended weight loss, disuse of UE and poor P.O. intake. Admitted for sepsis 2/2 unknown etiology and severe hypothyroidism. PMH: severe obesity s/p bariatric surgery, OSA, hypoventilation syndrome, HTN, cholelithiasis, bilateral extremity lipoma, chronic lymphedema s/p IVC filter placement, IDA   OT comments  Patient seen for re-evaluation. Chart reviewed to date. Pt received supine in bed and agreeable to OT. Tx session focused on increasing independence in self-feeding as well as improving ROM/strength in BUE. Pt engaged in BUE AAROM/AROM exercises using yellow theraband (see details below). Tolerated well. Pt continues to experience decreased FMC, ROM, and strength with L>R. Pt then engaged in simulated self-feeding using built up handles (discussed how to obtain for home use if desired). Pt left as received with all needs in reach. Reviewed goals and updated based on progress. Pt continues to benefit from skilled OT to maximize safety and independence.     Recommendations for follow up therapy are one component of a multi-disciplinary discharge planning process, led by the attending physician.  Recommendations may be updated based on patient status, additional functional criteria and insurance authorization.    Assistance Recommended at Discharge Frequent or constant Supervision/Assistance  Patient can return home with the following  Two people to help with bathing/dressing/bathroom;Two people to help with walking and/or transfers;Assistance with cooking/housework;Assist for transportation;Help with stairs or ramp for entrance   Equipment Recommendations  None recommended by OT    Recommendations for Other Services      Precautions / Restrictions  Precautions Precautions: Fall Restrictions Weight Bearing Restrictions: No       Mobility Bed Mobility Overal bed mobility: Needs Assistance             General bed mobility comments: NT. Requires +3 for bed mobility at baseline.    Transfers       General transfer comment: unable     Balance         ADL either performed or assessed with clinical judgement   ADL Overall ADL's : Needs assistance/impaired Eating/Feeding: Bed level;With adaptive utensils;Set up Eating/Feeding Details (indicate cue type and reason): Good ability to grasp onto built up utensil using R hand and able to bring to mouth to simulate self-feeding, anticipate assistance required to prevent spillage        Extremity/Trunk Assessment Upper Extremity Assessment Upper Extremity Assessment: RUE deficits/detail;LUE deficits/detail RUE Deficits / Details: Pt able to functionally reach for & touch face/nose/top of head, insufficient grip to hold onto normal eating utensil (improved with built up handle) LUE Deficits / Details: Pt unable to make full composite fist, minimal digit flexion noted, elbow flexion/extension 2+/5, shoulder 1/5.   Lower Extremity Assessment Lower Extremity Assessment: Defer to PT evaluation        Vision Patient Visual Report: No change from baseline     Perception     Praxis      Cognition Arousal/Alertness: Awake/alert Behavior During Therapy: WFL for tasks assessed/performed Overall Cognitive Status: Within Functional Limits for tasks assessed              Exercises General Exercises - Upper Extremity Shoulder Flexion: AROM, AAROM, Strengthening, Both, 10 reps, Seated, Theraband (while sitting up in bed) Theraband Level (Shoulder Flexion): Level 1 (Yellow) Shoulder Horizontal ADduction: AROM, AAROM, Strengthening, Both, 10 reps, Seated, Theraband Theraband Level (Shoulder Horizontal  Adduction): Level 1 (Yellow) Elbow Flexion: AROM, AAROM, Strengthening,  Both, 10 reps, Seated Elbow Extension: AROM, AAROM, Strengthening, Both, 10 reps, Seated Digit Composite Flexion: AROM, Both, PROM, 10 reps, Seated Composite Extension: AROM, PROM, Both, 10 reps, Seated Other Exercises Other Exercises: OT discussed use of built up handles for feeding utensils (showed pt where to order online).    Shoulder Instructions       General Comments      Pertinent Vitals/ Pain       Pain Assessment Pain Assessment: No/denies pain  Home Living          Prior Functioning/Environment              Frequency  Min 1X/week        Progress Toward Goals  OT Goals(current goals can now be found in the care plan section)  Progress towards OT goals: Progressing toward goals  Acute Rehab OT Goals Patient Stated Goal: get better OT Goal Formulation: With patient Time For Goal Achievement: 05/27/23 Potential to Achieve Goals: Fair ADL Goals Pt/caregiver will Perform Home Exercise Program: Increased ROM;Increased strength;Both right and left upper extremity;With theraband;With theraputty;With written HEP provided;Independently  Plan Discharge plan remains appropriate;Frequency remains appropriate    Co-evaluation                 AM-PAC OT "6 Clicks" Daily Activity     Outcome Measure   Help from another person eating meals?: A Lot Help from another person taking care of personal grooming?: A Lot Help from another person toileting, which includes using toliet, bedpan, or urinal?: Total Help from another person bathing (including washing, rinsing, drying)?: Total Help from another person to put on and taking off regular upper body clothing?: A Lot Help from another person to put on and taking off regular lower body clothing?: Total 6 Click Score: 9    End of Session Equipment Utilized During Treatment: Other (comment) (yellow theraband)  OT Visit Diagnosis: Muscle weakness (generalized) (M62.81)   Activity Tolerance Patient tolerated  treatment well   Patient Left in bed;with call bell/phone within reach;with nursing/sitter in room   Nurse Communication Mobility status        Time: 5409-8119 OT Time Calculation (min): 21 min  Charges: OT General Charges $OT Visit: 1 Visit OT Treatments $Therapeutic Exercise: 8-22 mins  [facility] MS, OTR/L ascom [phone]  05/13/23, 2:54 PM

## 2023-05-13 NOTE — Progress Notes (Signed)
Palliative Medicine  - Chart Check   Medical records reviewed including progress notes, labs, imaging. No acute changes.    Barrie Folk MS Ed.S, RN Palliative Medicine Team Phone: [phone]

## 2023-05-13 NOTE — TOC Progression Note (Signed)
Transition of Care [facility]) - Progression Note    Patient Details  Name: Christopher Herman MRN: [id number] Date of Birth: 1976-08-24  Transition of Care [facility]) CM/SW Contact  Truddie Hidden, RN Phone Number: 05/13/2023, 2:51 PM  Clinical Narrative:    Message left for Olegario Messier, Admissions Coordinator for [facility] advising patient requires IV antibiotic for pseudomonas bacteremia.  RNCM requested call back to discuss discharge planning.     Expected Discharge Plan: Skilled Nursing Facility Barriers to Discharge: Continued Medical Work up  Expected Discharge Plan and Services     Post Acute Care Choice: Skilled Nursing Facility Living arrangements for the past 2 months: Single Family Home                                       Social Determinants of Health (SDOH) Interventions SDOH Screenings   Food Insecurity: No Food Insecurity (11/04/2022)  Housing: Low Risk  (11/04/2022)  Transportation Needs: No Transportation Needs (11/04/2022)  Alcohol Screen: Low Risk  (09/13/2018)  Depression (PHQ2-9): Low Risk  (11/04/2022)  Physical Activity: Inactive (11/04/2022)  Tobacco Use: Medium Risk (04/21/2023)    Readmission Risk Interventions     No data to display

## 2023-05-13 NOTE — Progress Notes (Signed)
Nutrition Follow-up  DOCUMENTATION CODES:   Non-severe (moderate) malnutrition in context of chronic illness  INTERVENTION:   -D/c 30 ml Prosource Plus TID, each supplement provides 100 kcals and 15 grams protein -D/c liquid MVI -D/c Molli Posey -Continue MVI with minerals BID -Continue Snacks TID with meals -Continue Vitamin C 500mg  po BID -Continue Folic acid 1mg  po daily  -Continue Thiamine 100mg  po daily  -Continue Copper 8mg  po daily  -Continue Vitamin A 10,000 units po daily  -Continue Ergocalciferol 50,000 units po weekly -Continue Vitamin E 400 units po daily  -Continue Lactaid with meals  -Continue Creon 24,000 units po with meals -Case discussed with MD; added 250 mg florastor BID  NUTRITION DIAGNOSIS:   Moderate Malnutrition related to chronic illness (h/o roux-en-y gastric bypass) as evidenced by moderate fat depletion, moderate muscle depletion, percent weight loss.  Ongoing  GOAL:   Patient will meet greater than or equal to 90% of their needs  Progressing   MONITOR:   PO intake, Supplement acceptance, Labs, Weight trends, Skin, I & O's  REASON FOR ASSESSMENT:   Rounds    ASSESSMENT:   47 y/o male with h/o hypothyroidism, anxiety, morbid obesity s/p roux-en-y (2013), anxiety, HTN, OSA, GERD, IVC filter, H. pylori, lymphedema and cholelithiasis who is admitted with septic shock.  Reviewed I/O's: +1.5 L x 24 hours and +3.7 L since 04/29/23  UOP: 800 ml x 24 hours   Xifaxin stopped today. Pt has been ordered IV meropenem.   Pt with very flat affect today. His eyes were closed and he was on phone at time of visit. He did not engage with this RD.   Case discussed with RN. Pt with variable intake. Noted meal completions 40-100%. Pt has been trying to focus on protein intake. Pt refusing supplements [employer] and [address]) stating they give him diarrhea. RD d/c'd supplements due to poor compliance.   Case discussed with MD; plan to add probiotics.    Per [facility] notes, pt with lt upper thigh and rt upper medial thigh full thickness wounds.   Noted with increased wt , likely related to fluid retention. Pt +3.7 L since 5/124.   Per TOC notes, plan to d/c to SNF with IV antibiotics.   Medications reviewed and include folic acid, lactaid, creon, remeron, thiamine, vitamin a, vitamin D, vitamin E, and sodium bicarbonate @ 50 ml/hr.  Labs reviewed: K: 3.2, CBGS: 87.   Diet Order:   Diet Order             Diet regular Room service appropriate? Yes with Assist; Fluid consistency: Thin  Diet effective now                   EDUCATION NEEDS:   Education needs have been addressed  Skin:  Skin Assessment: Reviewed RN Assessment (Right lateral malleolus:  0.3 cm x 0.2 cm x 0.1 cm, right buttocks ( in skin fold) 1 cm, edema to bilateral lower legs  nonintact lesion to left upper thigh: 3 cm x 5.2 cm)  Last BM:  05/13/23 (type 6)  Height:   Ht Readings from Last 1 Encounters:  04/21/23 5\' 5"  (1.651 m)    Weight:   Wt Readings from Last 1 Encounters:  05/13/23 (!) 174.6 kg    Ideal Body Weight:  61.8 kg  BMI:  Body mass index is 64.05 kg/m.  Estimated Nutritional Needs:   Kcal:  2300-2500  Protein:  140-155 grams  Fluid:  > 2 L  Loistine Chance, RD, LDN, [place] Registered Dietitian II Certified Diabetes Care and Education Specialist Please refer to [facility] for RD and/or RD on-call/weekend/after hours pager

## 2023-05-13 NOTE — Progress Notes (Signed)
[facility]  PROGRESS NOTE   Subjective:   Creatinine 2.7 (3.05)   UOP - recorded  On meropenum for pseudomonas.   Objective:  Vital signs: Blood pressure 111/82, pulse 100, temperature 98.1 F (36.7 C), temperature source Oral, resp. rate 18, height 5\' 5"  (1.651 m), weight (!) 174.6 kg, SpO2 98 %.  Intake/Output Summary (Last 24 hours) at 05/13/2023 1257 Last data filed at 05/13/2023 1222 Gross per 24 hour  Intake 2522.24 ml  Output 850 ml  Net 1672.24 ml    Filed Weights   05/08/23 0825 05/11/23 0804 05/13/23 0522  Weight: (!) 145.2 kg (!) 169.4 kg (!) 174.6 kg     Physical Exam: General:  No acute distress  Head:  Normocephalic, atraumatic. Moist oral mucosal membranes  Eyes:  Anicteric  Neck:  Supple  Lungs:   Clear to auscultation, normal effort  Heart:  S1S2 no rubs  Abdomen:   Soft, nontender, bowel sounds present  Extremities: 2+ peripheral edema.  Neurologic:  Awake, alert, following commands  Skin:  No lesions  :     Basic Metabolic Panel: Recent Labs  Lab 05/08/23 0800 05/09/23 0612 05/10/23 0546 05/11/23 0641 05/12/23 0755 05/13/23 0859  NA 134* 134* 136 137  --  136  K 3.4* 3.4* 3.5 3.4*  --  3.2*  CL 110 111 110 110  --  107  CO2 15* 16* 17* 18*  --  20*  GLUCOSE 96 115* 104* 89  --  84  BUN 63* 63* 62* 61*  --  60*  CREATININE 3.13* 3.03* 3.03* 3.05*  --  2.70*  CALCIUM 8.1* 8.1* 8.1* 8.2*  --  8.0*  PHOS 4.1 3.3 3.5 3.7 3.1 3.0    GFR: Estimated Creatinine Clearance: 52.1 mL/min (A) (by C-G formula based on SCr of 2.7 mg/dL (H)).  Liver Function Tests: No results for input(s): "AST", "ALT", "ALKPHOS", "BILITOT", "PROT", "ALBUMIN" in the last 168 hours. No results for input(s): "LIPASE", "AMYLASE" in the last 168 hours. No results for input(s): "AMMONIA" in the last 168 hours.  CBC: Recent Labs  Lab 05/09/23 0612 05/10/23 0546 05/10/23 1004 05/11/23 0641 05/12/23 0755 05/13/23 0859  WBC 29.8* 15.8*  --   13.2* 10.4 10.2  NEUTROABS 28.7* 14.4*  --  11.1* 9.0* 8.1*  HGB 7.3* 6.5* 7.6* 7.9* 8.4* 8.4*  HCT 22.7* 19.6* 22.6* 23.5* 25.6* 25.0*  MCV 87.3 86.7  --  84.8 85.9 85.0  PLT 197 168  --  207 223 251      HbA1C: Hemoglobin A1C  Date/Time Value Ref Range Status  02/11/2012 12:27 PM 5.8 4.2 - 6.3 % Final    Comment:    The American Diabetes Association recommends that a primary goal of therapy should be <7% and that physicians should reevaluate the treatment regimen in patients with HbA1c values consistently >8%.     Urinalysis: No results for input(s): "COLORURINE", "LABSPEC", "PHURINE", "GLUCOSEU", "HGBUR", "BILIRUBINUR", "KETONESUR", "PROTEINUR", "UROBILINOGEN", "NITRITE", "LEUKOCYTESUR" in the last 72 hours.  Invalid input(s): "APPERANCEUR"     Imaging: No results found.   Medications:    meropenem (MERREM) IV     sodium bicarbonate 150 mEq in sterile water 1,150 mL infusion 50 mL/hr at 05/13/23 1233    vitamin C  500 mg Oral BID   Chlorhexidine Gluconate Cloth  6 each Topical Daily   copper  8 mg Oral Daily   folic acid  1 mg Oral Daily   hydrocerin   Topical BID  lactase  6,000 Units Oral TID WC   levothyroxine  100 mcg Oral Q0600   lipase/protease/amylase  24,000 Units Oral TID AC   metoprolol succinate  12.5 mg Oral Daily   mirtazapine  15 mg Oral QHS   multivitamin with minerals  1 tablet Oral BID   pantoprazole  40 mg Oral QHS   Rivaroxaban  15 mg Oral BID WC   Followed by   Melene Muller ON 05/30/2023] rivaroxaban  20 mg Oral Q supper   thiamine  100 mg Oral Daily   vitamin A  10,000 Units Oral Daily   Vitamin D (Ergocalciferol)  50,000 Units Oral Q7 days   Vitamin E  400 Units Oral Daily   zinc sulfate  220 mg Oral Daily    Assessment/ Plan:     Mr.. DERRAN REI is a 47 y.o. male with morbid obesity status post bariatric surgery, sleep apnea, hypertension, GERD, bilateral lymphedema, DVT who presents to [facility] on 04/21/2023 for Severe hypothyroidism  [E03.9] Generalized weakness [R53.1] Lower limb ulcer, ankle, right, with unspecified severity (HCC) [L97.319] Hypothyroidism, unspecified type [E03.9] Sepsis, due to unspecified organism, unspecified whether acute organ dysfunction present (HCC) [A41.9]  Patient's hospitalization has been complicated by DVT, pseudomonas bacteremia and acute kidney injury.    #1: Acute kidney injury: secondary to vancomycin toxicity, sepsis and ATN.  Creatinine trending down.    #2: Hypertension: Continue metoprolol.   #3: Congestive heart failure: volume status is acceptable. Currently on IV fluids   #4: Hyponatremia: improved.    #5: Metabolic acidosis:  continue bicarb supplements.  #6: Anemia: status post PRBC transfusions.      LOS: 22 Lamont Dowdy, MD [facility] 5/15/202412:57 PM

## 2023-05-13 NOTE — Progress Notes (Signed)
Date of Admission:  04/21/2023     ID: Christopher Herman is a 47 y.o. male Principal Problem:   Severe hypothyroidism Active Problems:   Malnutrition of moderate degree (HCC)   Generalized weakness   Ulcer of right ankle (HCC)   Hypotension due to hypovolemia   Acute renal failure due to tubular necrosis (HCC)   Hypothyroidism   Multiple wounds   Bacteremia due to Pseudomonas   Lymphedema associated with obesity    Subjective: Patient is feeling okay  Medications:   vitamin C  500 mg Oral BID   Chlorhexidine Gluconate Cloth  6 each Topical Daily   copper  8 mg Oral Daily   folic acid  1 mg Oral Daily   hydrocerin   Topical BID   lactase  6,000 Units Oral TID WC   levothyroxine  100 mcg Oral Q0600   lipase/protease/amylase  24,000 Units Oral TID AC   metoprolol succinate  12.5 mg Oral Daily   mirtazapine  15 mg Oral QHS   multivitamin with minerals  1 tablet Oral BID   pantoprazole  40 mg Oral QHS   Rivaroxaban  15 mg Oral BID WC   Followed by   Melene Muller ON 05/30/2023] rivaroxaban  20 mg Oral Q supper   thiamine  100 mg Oral Daily   vitamin A  10,000 Units Oral Daily   Vitamin D (Ergocalciferol)  50,000 Units Oral Q7 days   Vitamin E  400 Units Oral Daily   zinc sulfate  220 mg Oral Daily    Objective: Vital signs in last 24 hours: Patient Vitals for the past 24 hrs:  BP Temp Temp src Pulse Resp SpO2 Weight  05/13/23 1227 111/82 98.1 F (36.7 C) Oral 100 18 98 % --  05/13/23 0852 122/86 98.2 F (36.8 C) Oral (!) 107 16 100 % --  05/13/23 0522 109/81 97.8 F (36.6 C) Oral (!) 106 18 100 % (!) 174.6 kg  05/13/23 0400 112/74 98 F (36.7 C) Oral (!) 101 15 91 % --  05/13/23 0300 105/65 -- -- (!) 105 14 95 % --  05/13/23 0200 107/84 -- -- (!) 112 17 98 % --  05/13/23 0100 105/73 -- -- (!) 104 14 98 % --  05/13/23 0000 (!) 128/99 -- -- (!) 121 (!) 21 (!) 59 % --  05/12/23 2340 118/83 97.9 F (36.6 C) Oral 94 16 100 % --  05/12/23 2300 110/73 -- -- (!) 109 16 99 % --   05/12/23 2200 114/84 -- -- (!) 107 18 99 % --  05/12/23 2100 108/72 -- -- (!) 114 18 98 % --  05/12/23 2048 104/76 98.2 F (36.8 C) Oral (!) 108 16 100 % --  05/12/23 2000 -- -- -- (!) 123 14 99 % --  05/12/23 1600 102/78 -- -- (!) 113 14 99 % --      PHYSICAL EXAM:  General: Alert, cooperative, no distress, appears stated age.   lungs: Clear to auscultation bilaterally. No Wheezing or Rhonchi. No rales. Heart: Regular rate and rhythm, no murmur, rub or gallop. Abdomen: Soft, non-tender,not distended. Bowel sounds normal. No masses Extremities: Lymphedema legs         Skin: as above Lymph: Cervical, supraclavicular normal. Neurologic: weakness all extremities  Lab Results    Latest Ref Rng & Units 05/13/2023    8:59 AM 05/12/2023    7:55 AM 05/11/2023    6:41 AM  CBC  WBC 4.0 - 10.5 K/uL  10.2  10.4  13.2   Hemoglobin 13.0 - 17.0 g/dL 8.4  8.4  7.9   Hematocrit 39.0 - 52.0 % 25.0  25.6  23.5   Platelets 150 - 400 K/uL 251  223  207        Latest Ref Rng & Units 05/13/2023    8:59 AM 05/11/2023    6:41 AM 05/10/2023    5:46 AM  CMP  Glucose 70 - 99 mg/dL 84  89  664   BUN 6 - 20 mg/dL 60  61  62   Creatinine 0.61 - 1.24 mg/dL 4.03  4.74  2.59   Sodium 135 - 145 mmol/L 136  137  136   Potassium 3.5 - 5.1 mmol/L 3.2  3.4  3.5   Chloride 98 - 111 mmol/L 107  110  110   CO2 22 - 32 mmol/L 20  18  17    Calcium 8.9 - 10.3 mg/dL 8.0  8.2  8.1       Microbiology: Wound culture pseudomonas Studies/Results: No results found.   Assessment/Plan: pseudomonas bacteremia - due to skin wounds infected with pseudomonas.  .    central line  has been removed Pt is on meropenem will need for 10-14 days-until  05/23/23   Fever and leucocytosis much improved    Severe hypothyroidism- new diagnosis and is on on  synthroid On admission was hypothermic, hyponatremic thought to be due to above   Shock- sepsis VS SIR - resolved   Worsening AKI- creatinine plateud at 3.05    Severe obesity   Gastric bypass   Anemia   Prolonged clearance of vanco, with increase in levels even after the antibiotic was stopped for nearly 2 weeks     Discussed the management with the patient

## 2023-05-14 DIAGNOSIS — E039 Hypothyroidism, unspecified: Secondary | ICD-10-CM | POA: Diagnosis not present

## 2023-05-14 DIAGNOSIS — I890 Lymphedema, not elsewhere classified: Secondary | ICD-10-CM | POA: Diagnosis not present

## 2023-05-14 DIAGNOSIS — R7881 Bacteremia: Secondary | ICD-10-CM | POA: Diagnosis not present

## 2023-05-14 DIAGNOSIS — B965 Pseudomonas (aeruginosa) (mallei) (pseudomallei) as the cause of diseases classified elsewhere: Secondary | ICD-10-CM | POA: Diagnosis not present

## 2023-05-14 DIAGNOSIS — T07XXXA Unspecified multiple injuries, initial encounter: Secondary | ICD-10-CM | POA: Diagnosis not present

## 2023-05-14 LAB — CBC WITH DIFFERENTIAL/PLATELET
Abs Immature Granulocytes: 0.25 10*3/uL — ABNORMAL HIGH (ref 0.00–0.07)
Basophils Absolute: 0.1 10*3/uL (ref 0.0–0.1)
Basophils Relative: 1 %
Eosinophils Absolute: 0.4 10*3/uL (ref 0.0–0.5)
Eosinophils Relative: 4 %
HCT: 24.8 % — ABNORMAL LOW (ref 39.0–52.0)
Hemoglobin: 8.1 g/dL — ABNORMAL LOW (ref 13.0–17.0)
Immature Granulocytes: 2 %
Lymphocytes Relative: 10 %
Lymphs Abs: 1 10*3/uL (ref 0.7–4.0)
MCH: 28.4 pg (ref 26.0–34.0)
MCHC: 32.7 g/dL (ref 30.0–36.0)
MCV: 87 fL (ref 80.0–100.0)
Monocytes Absolute: 0.9 10*3/uL (ref 0.1–1.0)
Monocytes Relative: 8 %
Neutro Abs: 8.1 10*3/uL — ABNORMAL HIGH (ref 1.7–7.7)
Neutrophils Relative %: 75 %
Platelets: 276 10*3/uL (ref 150–400)
RBC: 2.85 MIL/uL — ABNORMAL LOW (ref 4.22–5.81)
RDW: 19.4 % — ABNORMAL HIGH (ref 11.5–15.5)
WBC: 10.7 10*3/uL — ABNORMAL HIGH (ref 4.0–10.5)
nRBC: 0 % (ref 0.0–0.2)

## 2023-05-14 LAB — AEROBIC CULTURE W GRAM STAIN (SUPERFICIAL SPECIMEN)

## 2023-05-14 LAB — BASIC METABOLIC PANEL
Anion gap: 10 (ref 5–15)
BUN: 59 mg/dL — ABNORMAL HIGH (ref 6–20)
CO2: 18 mmol/L — ABNORMAL LOW (ref 22–32)
Calcium: 8 mg/dL — ABNORMAL LOW (ref 8.9–10.3)
Chloride: 107 mmol/L (ref 98–111)
Creatinine, Ser: 2.37 mg/dL — ABNORMAL HIGH (ref 0.61–1.24)
GFR, Estimated: 34 mL/min — ABNORMAL LOW (ref >60)
Glucose, Bld: 87 mg/dL (ref 70–99)
Potassium: 3.7 mmol/L (ref 3.5–5.1)
Sodium: 135 mmol/L (ref 135–145)

## 2023-05-14 LAB — CULTURE, BLOOD (ROUTINE X 2): Culture: NO GROWTH

## 2023-05-14 LAB — PHOSPHORUS: Phosphorus: 2.7 mg/dL (ref 2.5–4.6)

## 2023-05-14 NOTE — Progress Notes (Signed)
[facility]  PROGRESS NOTE   Subjective:   Creatinine 2.7 (3.05)   UOP - recorded  On meropenum for pseudomonas.   Objective:  Vital signs: Blood pressure 95/76, pulse (!) 101, temperature 98.6 F (37 C), resp. rate 15, height 5\' 5"  (1.651 m), weight (!) 175.2 kg, SpO2 100 %.  Intake/Output Summary (Last 24 hours) at 05/14/2023 1229 Last data filed at 05/14/2023 0520 Gross per 24 hour  Intake 2295.9 ml  Output 550 ml  Net 1745.9 ml    Filed Weights   05/11/23 0804 05/13/23 0522 05/14/23 0437  Weight: (!) 169.4 kg (!) 174.6 kg (!) 175.2 kg     Physical Exam: General:  No acute distress  Head:  Normocephalic, atraumatic. Moist oral mucosal membranes  Eyes:  Anicteric  Neck:  Supple  Lungs:   Clear to auscultation, normal effort  Heart:  S1S2 no rubs  Abdomen:   Soft, nontender, bowel sounds present  Extremities: 2+ peripheral edema.  Neurologic:  Awake, alert, following commands  Skin:  No lesions  :     Basic Metabolic Panel: Recent Labs  Lab 05/09/23 0612 05/10/23 0546 05/11/23 0641 05/12/23 0755 05/13/23 0859 05/14/23 0600  NA 134* 136 137  --  136 135  K 3.4* 3.5 3.4*  --  3.2* 3.7  CL 111 110 110  --  107 107  CO2 16* 17* 18*  --  20* 18*  GLUCOSE 115* 104* 89  --  84 87  BUN 63* 62* 61*  --  60* 59*  CREATININE 3.03* 3.03* 3.05*  --  2.70* 2.37*  CALCIUM 8.1* 8.1* 8.2*  --  8.0* 8.0*  PHOS 3.3 3.5 3.7 3.1 3.0 2.7    GFR: Estimated Creatinine Clearance: 59.6 mL/min (A) (by C-G formula based on SCr of 2.37 mg/dL (H)).  Liver Function Tests: No results for input(s): "AST", "ALT", "ALKPHOS", "BILITOT", "PROT", "ALBUMIN" in the last 168 hours. No results for input(s): "LIPASE", "AMYLASE" in the last 168 hours. No results for input(s): "AMMONIA" in the last 168 hours.  CBC: Recent Labs  Lab 05/10/23 0546 05/10/23 1004 05/11/23 0641 05/12/23 0755 05/13/23 0859 05/14/23 0600  WBC 15.8*  --  13.2* 10.4 10.2 10.7*   NEUTROABS 14.4*  --  11.1* 9.0* 8.1* 8.1*  HGB 6.5* 7.6* 7.9* 8.4* 8.4* 8.1*  HCT 19.6* 22.6* 23.5* 25.6* 25.0* 24.8*  MCV 86.7  --  84.8 85.9 85.0 87.0  PLT 168  --  207 223 251 276      HbA1C: Hemoglobin A1C  Date/Time Value Ref Range Status  02/11/2012 12:27 PM 5.8 4.2 - 6.3 % Final    Comment:    The American Diabetes Association recommends that a primary goal of therapy should be <7% and that physicians should reevaluate the treatment regimen in patients with HbA1c values consistently >8%.     Urinalysis: No results for input(s): "COLORURINE", "LABSPEC", "PHURINE", "GLUCOSEU", "HGBUR", "BILIRUBINUR", "KETONESUR", "PROTEINUR", "UROBILINOGEN", "NITRITE", "LEUKOCYTESUR" in the last 72 hours.  Invalid input(s): "APPERANCEUR"     Imaging: No results found.   Medications:    meropenem (MERREM) IV 1 g (05/14/23 0520)   sodium bicarbonate 150 mEq in sterile water 1,150 mL infusion 50 mL/hr at 05/13/23 1233    vitamin C  500 mg Oral BID   Chlorhexidine Gluconate Cloth  6 each Topical Daily   copper  8 mg Oral Daily   folic acid  1 mg Oral Daily   hydrocerin   Topical BID  lactase  6,000 Units Oral TID WC   levothyroxine  100 mcg Oral Q0600   lipase/protease/amylase  24,000 Units Oral TID AC   metoprolol succinate  12.5 mg Oral Daily   mirtazapine  15 mg Oral QHS   multivitamin with minerals  1 tablet Oral BID   pantoprazole  40 mg Oral QHS   Rivaroxaban  15 mg Oral BID WC   Followed by   Melene Muller ON 05/30/2023] rivaroxaban  20 mg Oral Q supper   saccharomyces boulardii  250 mg Oral BID   thiamine  100 mg Oral Daily   vitamin A  10,000 Units Oral Daily   Vitamin D (Ergocalciferol)  50,000 Units Oral Q7 days   Vitamin E  400 Units Oral Daily   zinc sulfate  220 mg Oral Daily    Assessment/ Plan:     Mr.. Christopher Herman is a 47 y.o. male with morbid obesity status post bariatric surgery, sleep apnea, hypertension, GERD, bilateral lymphedema, DVT who presents to  [facility] on 04/21/2023 for Severe hypothyroidism [E03.9] Generalized weakness [R53.1] Lower limb ulcer, ankle, right, with unspecified severity (HCC) [L97.319] Hypothyroidism, unspecified type [E03.9] Sepsis, due to unspecified organism, unspecified whether acute organ dysfunction present (HCC) [A41.9]  Patient's hospitalization has been complicated by DVT, pseudomonas bacteremia and acute kidney injury.    #1: Acute kidney injury: secondary to vancomycin toxicity, sepsis and ATN.  Creatinine improving   #2: Hypertension: Continue metoprolol.   #3: Congestive heart failure: volume status is acceptable. Currently on IV fluids   #4: Hyponatremia: improved.    #5: Metabolic acidosis:  continue bicarb supplements.  #6: Anemia: status post PRBC transfusions.      LOS: 23 Lamont Dowdy, MD [facility] 5/16/202412:29 PM

## 2023-05-14 NOTE — Progress Notes (Signed)
Date of Admission:  04/21/2023     ID: Christopher Herman is a 47 y.o. male Principal Problem:   Severe hypothyroidism Active Problems:   Malnutrition of moderate degree (HCC)   Generalized weakness   Ulcer of right ankle (HCC)   Hypotension due to hypovolemia   Acute renal failure due to tubular necrosis (HCC)   Hypothyroidism   Multiple wounds   Bacteremia due to Pseudomonas   Lymphedema associated with obesity    Subjective: Patient is  having itching especially of upper body, below neck  Medications:   vitamin C  500 mg Oral BID   Chlorhexidine Gluconate Cloth  6 each Topical Daily   copper  8 mg Oral Daily   folic acid  1 mg Oral Daily   hydrocerin   Topical BID   lactase  6,000 Units Oral TID WC   levothyroxine  100 mcg Oral Q0600   lipase/protease/amylase  24,000 Units Oral TID AC   metoprolol succinate  12.5 mg Oral Daily   mirtazapine  15 mg Oral QHS   multivitamin with minerals  1 tablet Oral BID   pantoprazole  40 mg Oral QHS   Rivaroxaban  15 mg Oral BID WC   Followed by   Melene Muller ON 05/30/2023] rivaroxaban  20 mg Oral Q supper   saccharomyces boulardii  250 mg Oral BID   thiamine  100 mg Oral Daily   vitamin A  10,000 Units Oral Daily   Vitamin D (Ergocalciferol)  50,000 Units Oral Q7 days   Vitamin E  400 Units Oral Daily   zinc sulfate  220 mg Oral Daily    Objective: Vital signs in last 24 hours: Patient Vitals for the past 24 hrs:  BP Temp Temp src Pulse Resp SpO2 Weight  05/14/23 1253 102/78 98.4 F (36.9 C) -- 100 16 99 % --  05/14/23 0700 95/76 98.6 F (37 C) -- (!) 101 15 100 % --  05/14/23 0437 100/84 98.7 F (37.1 C) Oral 98 16 100 % (!) 175.2 kg  05/13/23 2313 105/69 98.7 F (37.1 C) Oral (!) 110 14 100 % --  05/13/23 1934 108/79 98.6 F (37 C) Oral (!) 122 18 100 % --  05/13/23 1651 119/78 98.7 F (37.1 C) Oral (!) 116 16 99 % --      PHYSICAL EXAM:  General: Alert, cooperative, no distress, appears stated age.   lungs: Clear to  auscultation bilaterally. No Wheezing or Rhonchi. No rales. Heart: Regular rate and rhythm, no murmur, rub or gallop. Abdomen: Soft, non-tender,not distended. Bowel sounds normal. No masses Extremities: Lymphedema legs         Skin: purpuric scratch marks over rt shoulder     Lymph: Cervical, supraclavicular normal. Neurologic: weakness all extremities  Lab Results    Latest Ref Rng & Units 05/14/2023    6:00 AM 05/13/2023    8:59 AM 05/12/2023    7:55 AM  CBC  WBC 4.0 - 10.5 K/uL 10.7  10.2  10.4   Hemoglobin 13.0 - 17.0 g/dL 8.1  8.4  8.4   Hematocrit 39.0 - 52.0 % 24.8  25.0  25.6   Platelets 150 - 400 K/uL 276  251  223        Latest Ref Rng & Units 05/14/2023    6:00 AM 05/13/2023    8:59 AM 05/11/2023    6:41 AM  CMP  Glucose 70 - 99 mg/dL 87  84  89   BUN  6 - 20 mg/dL 59  60  61   Creatinine 0.61 - 1.24 mg/dL 7.42  5.95  6.38   Sodium 135 - 145 mmol/L 135  136  137   Potassium 3.5 - 5.1 mmol/L 3.7  3.2  3.4   Chloride 98 - 111 mmol/L 107  107  110   CO2 22 - 32 mmol/L 18  20  18    Calcium 8.9 - 10.3 mg/dL 8.0  8.0  8.2       Microbiology: Wound culture pseudomonas   Assessment/Plan: pseudomonas bacteremia - due to skin wounds infected with pseudomonas.  .    central line  has been removed Pt is on meropenem will need for 10-14 days-until  05/23/23   Fever and leucocytosis much improved    Severe hypothyroidism- new diagnosis and is on on  synthroid On admission was hypothermic, hyponatremic thought to be due to above   Shock- sepsis VS SIR - resolved   Worsening AKI- creatinine plateud at 3.05   Severe obesity   Gastric bypass   Anemia  New Pruritus- likely due to dry skin Scratch marks from nails on his rt shoulder- no rash otherwise   Prolonged clearance of vanco, with increase in levels even after the antibiotic was stopped for nearly 2 weeks     Discussed the management with the patient and mother and hospitalist

## 2023-05-14 NOTE — Progress Notes (Signed)
PROGRESS NOTE    Christopher Herman  ZOX:[id number] DOB: 22-May-1976 DOA: 04/21/2023 PCP: Pcp, No    Brief Narrative:  Patient is a 47 y.o. male with a PMH significant for severe obesity s/p bariatric surgery, OSA, hypoventilation syndrome, HTN, GERD, cholelithiasis, bilateral extremity, chronic lymphedema ,status post IVC filter placement, IDA who presented from home to the ED on 04/21/2023 with generalized weakness, extreme fatigue, unintended weight loss, disuse of upper extremities and poor p.o. intake.Marland Kitchen He is chronically bed bound and malnourished.  Initially septic shock was suspected, admitted to ICU, started on broad-spectrum antibiotics.  Also required vasopressors in the interim but have not been discontinued. Hospital course remarkable for worsening kidney function.  Nephrology following, suspected to have ATN given septic shock.  PT/OT recommending SNF/long-term care.  TOC following.  Palliative care was also consulted for goals of care.  Remains full code.   5/8: DVT study positive 5/11: Febrile, repeat cultures sent, intermittent diarrhea noted.  Resume vancomycin pending cultures.  Initiate Xarelto 5/12: Blood culture with Pseudomonas-vancomycin/ceftriaxone changed to cefepime.  Hemoglobin decreased to 6.5, without overt bleeding. 1 unit of PRBC.   5/13-14: ID consulted for bacteremia with Pseudomonas aeruginosa.  5/15: Remains on meropenem for Pseudomonas bacteremia 5/16: Unfortunately admissions coordinator at [facility] health care notified us that the bed offer was rescinded.  Multiple reasons given   Assessment & Plan:   Principal Problem:   Severe hypothyroidism Active Problems:   Malnutrition of moderate degree (HCC)   Generalized weakness   Ulcer of right ankle (HCC)   Hypotension due to hypovolemia   Acute renal failure due to tubular necrosis (HCC)   Hypothyroidism   Multiple wounds   Bacteremia due to Pseudomonas   Lymphedema associated with obesity  Septic shock  secondary to staph bacteremia with repeat culture showing Pseudomonas, POA:  Shock physiology as long resolved.  Patient was previously on midodrine and stress dose steroids for blood pressure support.  These have been weaned off.  Initial blood cultures positive for Staphylococcus capitis.  Subsequently patient developed recurrent fevers.  Repeat blood culture positive for Pseudomonas.  Source appears to be multiple skin infections.  Cultured by ID. Plan: Continue meropenem with pharmacy dosing assistance.  Infectious disease following.  Monitor vitals and fever curve.  End date antibiotics will be 5/25  Left lower extremity DVT.   -Noted on ultrasound 5/8  -Started Xarelto 5/11  -No respiratory symptoms, no indication for VQ scan, given patient's bedbound status he remains remarkably high risk for recurrent DVT.   AKI:  Likely ATN in the setting of septic shock, continue bicarb IV GTT Ultrasound unremarkable, urine output remains appropriate Nephrology consulted, no indication for dialysis Creatinine continues to improve on 5/16   Chronic combined systolic /diastolic CHF:  Echo done on 4/24 showed EF of 35 to 40%, global hypokinesis, grade 1 diastolic dysfunction.   Appears euvolemic, monitor closely in the setting of ongoing IV fluids   Hypokalemia: Improved.  Continue to monitor and replete as needed.   Severe hypothyroidism:  Completed IV Synthroid dosing, continue p.o.  Recommend follow-up thyroid function test in 4 to 6 weeks.   Leukocytosis/thrombocytosis: Resolved, questionably reactive given above.   Anemia of chronic disease:  Status post 3 unit PRBC this hospitalization Low threshold for imaging given newly prescribed Xarelto   Chronic sinus tachycardia:  Chronic problem.  QTc also prolonged.  Avoid QTc prolonging medications.  On low-dose metoprolol   Depression: Continue Remeron   Chronic abdomen pain/diarrhea :He complains  of chronic abdominal pain and fear of  eating as it precipitates diarrhea.  Continue Creon - appetite/diarrhea and bloating sensations appear to be improving.     Morbid obesity/bedbound status: PT/OT recommending.  Long-term facility.  TOC following.  Unfortunately bed offer at [facility] was rescinded for reasons that are unclear.  They have cited no capacity however they have previously offered a bed so this response is dubious at best   Goals of care: Morbidly obese patient with multiple medical problems.  History of bariatric surgery, OSA, obesity hypoventilation syndrome, chronic lymphedema, status post IVC filter placement. bedbound status.  Palliative care was following for goals of care, remains full code.  Will need long-term care facility.  May be appropriate candidate for long-term acute care hospital  DVT prophylaxis: Xarelto Code Status: Full Family Communication: None Disposition Plan: Status is: Inpatient Remains inpatient appropriate because: Pseudomonas bacteremia on IV antibiotics   Level of care: Med-Surg  Consultants:  Nephrology ID  Procedures:  None  Antimicrobials: Meropenem   Subjective: Seen and examined.  Voice stronger.  Stating he is eating more.  No complaints of abdominal pain this morning.  Objective: Vitals:   05/13/23 1934 05/13/23 2313 05/14/23 0437 05/14/23 0700  BP: 108/79 105/69 100/84 95/76  Pulse: (!) 122 (!) 110 98 (!) 101  Resp: 18 14 16 15   Temp: 98.6 F (37 C) 98.7 F (37.1 C) 98.7 F (37.1 C) 98.6 F (37 C)  TempSrc: Oral Oral Oral   SpO2: 100% 100% 100% 100%  Weight:   (!) 175.2 kg   Height:        Intake/Output Summary (Last 24 hours) at 05/14/2023 1322 Last data filed at 05/14/2023 0520 Gross per 24 hour  Intake 2295.9 ml  Output 550 ml  Net 1745.9 ml   Filed Weights   05/11/23 0804 05/13/23 0522 05/14/23 0437  Weight: (!) 169.4 kg (!) 174.6 kg (!) 175.2 kg    Examination:  General exam: NAD.  Appears chronically ill Respiratory system:  Poor respiratory effort.  Lung sounds decreased at bases.  Normal work of breathing.  Room air Cardiovascular system: S1-S2, tachycardic, regular rhythm, no murmurs, lymphedema bilateral lower extremities Gastrointestinal system: Obese, soft, NT/ND Central nervous system: Alert and oriented. No focal neurological deficits. Extremities: Markedly decreased power bilateral upper and lower extremities Skin: Multiple areas of skin lesions and breakdown in various levels of healing Psychiatry: Judgement and insight appear normal. Mood & affect appropriate.     Data Reviewed: I have personally reviewed following labs and imaging studies  CBC: Recent Labs  Lab 05/10/23 0546 05/10/23 1004 05/11/23 0641 05/12/23 0755 05/13/23 0859 05/14/23 0600  WBC 15.8*  --  13.2* 10.4 10.2 10.7*  NEUTROABS 14.4*  --  11.1* 9.0* 8.1* 8.1*  HGB 6.5* 7.6* 7.9* 8.4* 8.4* 8.1*  HCT 19.6* 22.6* 23.5* 25.6* 25.0* 24.8*  MCV 86.7  --  84.8 85.9 85.0 87.0  PLT 168  --  207 223 251 276   Basic Metabolic Panel: Recent Labs  Lab 05/09/23 0612 05/10/23 0546 05/11/23 0641 05/12/23 0755 05/13/23 0859 05/14/23 0600  NA 134* 136 137  --  136 135  K 3.4* 3.5 3.4*  --  3.2* 3.7  CL 111 110 110  --  107 107  CO2 16* 17* 18*  --  20* 18*  GLUCOSE 115* 104* 89  --  84 87  BUN 63* 62* 61*  --  60* 59*  CREATININE 3.03* 3.03* 3.05*  --  2.70* 2.37*  CALCIUM 8.1* 8.1* 8.2*  --  8.0* 8.0*  PHOS 3.3 3.5 3.7 3.1 3.0 2.7   GFR: Estimated Creatinine Clearance: 59.6 mL/min (A) (by C-G formula based on SCr of 2.37 mg/dL (H)). Liver Function Tests: No results for input(s): "AST", "ALT", "ALKPHOS", "BILITOT", "PROT", "ALBUMIN" in the last 168 hours. No results for input(s): "LIPASE", "AMYLASE" in the last 168 hours. No results for input(s): "AMMONIA" in the last 168 hours. Coagulation Profile: No results for input(s): "INR", "PROTIME" in the last 168 hours. Cardiac Enzymes: No results for input(s): "CKTOTAL", "CKMB",  "CKMBINDEX", "TROPONINI" in the last 168 hours. BNP (last 3 results) No results for input(s): "PROBNP" in the last 8760 hours. HbA1C: No results for input(s): "HGBA1C" in the last 72 hours. CBG: Recent Labs  Lab 05/11/23 0951 05/11/23 1139 05/11/23 1619 05/12/23 0656 05/12/23 1205  GLUCAP 86 83 75 96 87   Lipid Profile: No results for input(s): "CHOL", "HDL", "LDLCALC", "TRIG", "CHOLHDL", "LDLDIRECT" in the last 72 hours. Thyroid Function Tests: No results for input(s): "TSH", "T4TOTAL", "FREET4", "T3FREE", "THYROIDAB" in the last 72 hours. Anemia Panel: No results for input(s): "VITAMINB12", "FOLATE", "FERRITIN", "TIBC", "IRON", "RETICCTPCT" in the last 72 hours. Sepsis Labs: Recent Labs  Lab 05/09/23 0700 05/09/23 0755  LATICACIDVEN 0.8 0.7    Recent Results (from the past 240 hour(s))  Culture, blood (Routine X 2) w Reflex to ID Panel     Status: Abnormal   Collection Time: 05/09/23  9:09 AM   Specimen: BLOOD  Result Value Ref Range Status   Specimen Description   Final    BLOOD A-LINE Performed at [facility], [address], [place], [place] [address]    Special Requests   Final    BOTTLES DRAWN AEROBIC AND ANAEROBIC AEROMONAS CAVIAE Performed at [facility], [address] [place], [place] [address]    Culture  Setup Time   Final    GRAM NEGATIVE RODS IN BOTH AEROBIC AND ANAEROBIC BOTTLES CRITICAL RESULT CALLED TO, READ BACK BY AND VERIFIED WITH: JASON ROBINS @0420  05/10/23 [facility]    Culture PSEUDOMONAS AERUGINOSA (A)  Final   Report Status 05/12/2023 FINAL  Final   Organism ID, Bacteria PSEUDOMONAS AERUGINOSA  Final      Susceptibility   Pseudomonas aeruginosa - MIC*    CEFTAZIDIME 8 SENSITIVE Sensitive     CIPROFLOXACIN 1 INTERMEDIATE Intermediate     GENTAMICIN <=1 SENSITIVE Sensitive     IMIPENEM 2 SENSITIVE Sensitive     * PSEUDOMONAS AERUGINOSA  Culture, blood (Routine X 2) w Reflex to ID Panel     Status: None   Collection Time:  05/09/23  9:09 AM   Specimen: BLOOD LEFT ARM  Result Value Ref Range Status   Specimen Description BLOOD LEFT ARM  Final   Special Requests   Final    BOTTLES DRAWN AEROBIC ONLY Blood Culture adequate volume   Culture   Final    NO GROWTH 5 DAYS Performed at [facility], [address] [place], [place] [address]    Report Status 05/14/2023 FINAL  Final  Blood Culture ID Panel (Reflexed)     Status: Abnormal   Collection Time: 05/09/23  9:09 AM  Result Value Ref Range Status   Enterococcus faecalis NOT DETECTED NOT DETECTED Final   Enterococcus Faecium NOT DETECTED NOT DETECTED Final   Listeria monocytogenes NOT DETECTED NOT DETECTED Final   Staphylococcus species NOT DETECTED NOT DETECTED Final   Staphylococcus aureus (BCID)  NOT DETECTED NOT DETECTED Final   Staphylococcus epidermidis NOT DETECTED NOT DETECTED Final   Staphylococcus lugdunensis NOT DETECTED NOT DETECTED Final   Streptococcus species NOT DETECTED NOT DETECTED Final   Streptococcus agalactiae NOT DETECTED NOT DETECTED Final   Streptococcus pneumoniae NOT DETECTED NOT DETECTED Final   Streptococcus pyogenes NOT DETECTED NOT DETECTED Final   A.calcoaceticus-baumannii NOT DETECTED NOT DETECTED Final   Bacteroides fragilis NOT DETECTED NOT DETECTED Final   Enterobacterales NOT DETECTED NOT DETECTED Final   Enterobacter cloacae complex NOT DETECTED NOT DETECTED Final   Escherichia coli NOT DETECTED NOT DETECTED Final   Klebsiella aerogenes NOT DETECTED NOT DETECTED Final   Klebsiella oxytoca NOT DETECTED NOT DETECTED Final   Klebsiella pneumoniae NOT DETECTED NOT DETECTED Final   Proteus species NOT DETECTED NOT DETECTED Final   Salmonella species NOT DETECTED NOT DETECTED Final   Serratia marcescens NOT DETECTED NOT DETECTED Final   Haemophilus influenzae NOT DETECTED NOT DETECTED Final   Neisseria meningitidis NOT DETECTED NOT DETECTED Final   Pseudomonas aeruginosa DETECTED (A) NOT DETECTED Final     Comment: CRITICAL RESULT CALLED TO, READ BACK BY AND VERIFIED WITH: JASON ROBINS @ 0420 05/10/23 [facility]    Stenotrophomonas maltophilia NOT DETECTED NOT DETECTED Final   Candida albicans NOT DETECTED NOT DETECTED Final   Candida auris NOT DETECTED NOT DETECTED Final   Candida glabrata NOT DETECTED NOT DETECTED Final   Candida krusei NOT DETECTED NOT DETECTED Final   Candida parapsilosis NOT DETECTED NOT DETECTED Final   Candida tropicalis NOT DETECTED NOT DETECTED Final   Cryptococcus neoformans/gattii NOT DETECTED NOT DETECTED Final   CTX-M ESBL NOT DETECTED NOT DETECTED Final   Carbapenem resistance IMP NOT DETECTED NOT DETECTED Final   Carbapenem resistance KPC NOT DETECTED NOT DETECTED Final   Carbapenem resistance NDM NOT DETECTED NOT DETECTED Final   Carbapenem resistance VIM NOT DETECTED NOT DETECTED Final    Comment: Performed at [facility], [address] [place], [place] [address]  Culture, blood (Routine X 2) w Reflex to ID Panel     Status: None (Preliminary result)   Collection Time: 05/11/23 11:08 AM   Specimen: BLOOD  Result Value Ref Range Status   Specimen Description BLOOD LEFT [facility]  Final   Special Requests   Final    BOTTLES DRAWN AEROBIC AND ANAEROBIC Blood Culture adequate volume   Culture   Final    NO GROWTH 3 DAYS Performed at [facility], [address] [place], [place] [address]    Report Status PENDING  Incomplete  Culture, blood (Routine X 2) w Reflex to ID Panel     Status: None (Preliminary result)   Collection Time: 05/11/23 11:08 AM   Specimen: BLOOD  Result Value Ref Range Status   Specimen Description BLOOD LEFT WRIST  Final   Special Requests   Final    BOTTLES DRAWN AEROBIC AND ANAEROBIC Blood Culture adequate volume   Culture   Final    NO GROWTH 3 DAYS Performed at [facility], [address] [place], [place] [address]    Report Status PENDING  Incomplete  Aerobic Culture w Gram Stain (superficial specimen)      Status: None   Collection Time: 05/11/23  2:25 PM   Specimen: Thigh; Wound  Result Value Ref Range Status   Specimen Description   Final    THIGH Performed at [facility], [address] [place], [place] [address]    Special Requests  Final    LEFT THIGH Performed at [facility], [address] [place], [place] [address]    Gram Stain   Final    NO WBC SEEN NO ORGANISMS SEEN Performed at [facility], [address], [place], [place] [address]    Culture RARE PSEUDOMONAS AERUGINOSA  Final   Report Status 05/14/2023 FINAL  Final   Organism ID, Bacteria PSEUDOMONAS AERUGINOSA  Final      Susceptibility   Pseudomonas aeruginosa - MIC*    CEFTAZIDIME 16 INTERMEDIATE Intermediate     CIPROFLOXACIN 1 INTERMEDIATE Intermediate     GENTAMICIN <=1 SENSITIVE Sensitive     IMIPENEM 2 SENSITIVE Sensitive     * RARE PSEUDOMONAS AERUGINOSA  Aerobic Culture w Gram Stain (superficial specimen)     Status: None   Collection Time: 05/11/23  2:30 PM   Specimen: Back; Wound  Result Value Ref Range Status   Specimen Description   Final    BACK Performed at [facility], [address], [place], [place] [address]    Special Requests   Final    BACK Performed at [facility], [address] [place], [place] [address]    Gram Stain   Final    NO WBC SEEN NO ORGANISMS SEEN Performed at [facility], [address] [place], [place] [address]    Culture   Final    FEW PSEUDOMONAS AERUGINOSA FEW ACINETOBACTER BAUMANNII    Report Status 05/14/2023 FINAL  Final   Organism ID, Bacteria PSEUDOMONAS AERUGINOSA  Final   Organism ID, Bacteria ACINETOBACTER BAUMANNII  Final      Susceptibility   Acinetobacter baumannii - MIC*    CEFTAZIDIME >=64 RESISTANT Resistant     CIPROFLOXACIN >=4 RESISTANT Resistant     GENTAMICIN >=16 RESISTANT Resistant     IMIPENEM 1 SENSITIVE Sensitive     PIP/TAZO >=128 RESISTANT Resistant      TRIMETH/SULFA >=320 RESISTANT Resistant     AMPICILLIN/SULBACTAM 4 SENSITIVE Sensitive     * FEW ACINETOBACTER BAUMANNII   Pseudomonas aeruginosa - MIC*    CEFTAZIDIME 16 INTERMEDIATE Intermediate     CIPROFLOXACIN 1 INTERMEDIATE Intermediate     GENTAMICIN 2 SENSITIVE Sensitive     IMIPENEM 2 SENSITIVE Sensitive     * FEW PSEUDOMONAS AERUGINOSA         Radiology Studies: No results found.      Scheduled Meds:  vitamin C  500 mg Oral BID   Chlorhexidine Gluconate Cloth  6 each Topical Daily   copper  8 mg Oral Daily   folic acid  1 mg Oral Daily   hydrocerin   Topical BID   lactase  6,000 Units Oral TID WC   levothyroxine  100 mcg Oral Q0600   lipase/protease/amylase  24,000 Units Oral TID AC   metoprolol succinate  12.5 mg Oral Daily   mirtazapine  15 mg Oral QHS   multivitamin with minerals  1 tablet Oral BID   pantoprazole  40 mg Oral QHS   Rivaroxaban  15 mg Oral BID WC   Followed by   Melene Muller ON 05/30/2023] rivaroxaban  20 mg Oral Q supper   saccharomyces boulardii  250 mg Oral BID   thiamine  100 mg Oral Daily   vitamin A  10,000 Units Oral Daily   Vitamin D (Ergocalciferol)  50,000 Units Oral Q7 days   Vitamin E  400 Units Oral Daily   zinc sulfate  220 mg  Oral Daily   Continuous Infusions:  meropenem (MERREM) IV 1 g (05/14/23 0520)   sodium bicarbonate 150 mEq in sterile water 1,150 mL infusion 50 mL/hr at 05/13/23 1233     LOS: 23 days     Tresa Moore, MD [facility]   If 7PM-7AM, please contact night-coverage  05/14/2023, 1:22 PM

## 2023-05-14 NOTE — TOC Progression Note (Addendum)
Transition of Care [facility]) - Progression Note    Patient Details  Name: NARVELL TOLLIS MRN: [id number] Date of Birth: 09-16-1976  [facility] CM/SW Contact  Truddie Hidden, RN Phone Number: 05/14/2023, 10:16 AM  Clinical Narrative:    Per Olegario Messier, Admissions Coordinator for [facility], patient's bed offer has been rescinded  due to no capacity.  MD notified.    1:00pm Spoke with patient at bedside to advised he no longer had a bed offer via [facility]. He was educated about LTACH and is agreeable to have [facility] Screen him for possible admission. He was also informed he search would likely have to be extended to other SNFs.    2:30pm Per Steward Ros at [facility] patient does not meet criteria for admission.  MD notified.      Expected Discharge Plan: Skilled Nursing Facility Barriers to Discharge: Continued Medical Work up  Expected Discharge Plan and Services     Post Acute Care Choice: Skilled Nursing Facility Living arrangements for the past 2 months: Single Family Home                                       Social Determinants of Health (SDOH) Interventions SDOH Screenings   Food Insecurity: No Food Insecurity (11/04/2022)  Housing: Low Risk  (11/04/2022)  Transportation Needs: No Transportation Needs (11/04/2022)  Alcohol Screen: Low Risk  (09/13/2018)  Depression (PHQ2-9): Low Risk  (11/04/2022)  Physical Activity: Inactive (11/04/2022)  Tobacco Use: Medium Risk (04/21/2023)    Readmission Risk Interventions     No data to display

## 2023-05-15 ENCOUNTER — Inpatient Hospital Stay: Payer: BC Managed Care – PPO

## 2023-05-15 DIAGNOSIS — I890 Lymphedema, not elsewhere classified: Secondary | ICD-10-CM | POA: Diagnosis not present

## 2023-05-15 DIAGNOSIS — T07XXXA Unspecified multiple injuries, initial encounter: Secondary | ICD-10-CM | POA: Diagnosis not present

## 2023-05-15 DIAGNOSIS — E039 Hypothyroidism, unspecified: Secondary | ICD-10-CM | POA: Diagnosis not present

## 2023-05-15 DIAGNOSIS — B965 Pseudomonas (aeruginosa) (mallei) (pseudomallei) as the cause of diseases classified elsewhere: Secondary | ICD-10-CM | POA: Diagnosis not present

## 2023-05-15 DIAGNOSIS — R7881 Bacteremia: Secondary | ICD-10-CM | POA: Diagnosis not present

## 2023-05-15 LAB — HEPATIC FUNCTION PANEL
ALT: 14 U/L (ref 0–44)
AST: 15 U/L (ref 15–41)
Albumin: 2.3 g/dL — ABNORMAL LOW (ref 3.5–5.0)
Alkaline Phosphatase: 54 U/L (ref 38–126)
Bilirubin, Direct: 0.1 mg/dL (ref 0.0–0.2)
Indirect Bilirubin: 0.5 mg/dL (ref 0.3–0.9)
Total Bilirubin: 0.6 mg/dL (ref 0.3–1.2)
Total Protein: 4.7 g/dL — ABNORMAL LOW (ref 6.5–8.1)

## 2023-05-15 MED ORDER — SODIUM BICARBONATE 650 MG PO TABS
650.0000 mg | ORAL_TABLET | Freq: Two times a day (BID) | ORAL | Status: DC
  Administered 2023-05-15 – 2023-05-23 (×17): 650 mg via ORAL
  Filled 2023-05-15 (×17): qty 1

## 2023-05-15 MED ORDER — BOOST / RESOURCE BREEZE PO LIQD CUSTOM
1.0000 | Freq: Three times a day (TID) | ORAL | Status: DC
  Administered 2023-05-16 – 2023-05-23 (×9): 1 via ORAL

## 2023-05-15 NOTE — Progress Notes (Signed)
PROGRESS NOTE    Christopher Herman  ZOX:[id number] DOB: 1976/10/02 DOA: 04/21/2023 PCP: Pcp, No    Brief Narrative:  Patient is a 47 y.o. male with a PMH significant for severe obesity s/p bariatric surgery, OSA, hypoventilation syndrome, HTN, GERD, cholelithiasis, bilateral extremity, chronic lymphedema ,status post IVC filter placement, IDA who presented from home to the ED on 04/21/2023 with generalized weakness, extreme fatigue, unintended weight loss, disuse of upper extremities and poor p.o. intake.Marland Kitchen He is chronically bed bound and malnourished.  Initially septic shock was suspected, admitted to ICU, started on broad-spectrum antibiotics.  Also required vasopressors in the interim but have not been discontinued. Hospital course remarkable for worsening kidney function.  Nephrology following, suspected to have ATN given septic shock.  PT/OT recommending SNF/long-term care.  TOC following.  Palliative care was also consulted for goals of care.  Remains full code.   5/8: DVT study positive 5/11: Febrile, repeat cultures sent, intermittent diarrhea noted.  Resume vancomycin pending cultures.  Initiate Xarelto 5/12: Blood culture with Pseudomonas-vancomycin/ceftriaxone changed to cefepime.  Hemoglobin decreased to 6.5, without overt bleeding. 1 unit of PRBC.   5/13-14: ID consulted for bacteremia with Pseudomonas aeruginosa.  5/15: Remains on meropenem for Pseudomonas bacteremia 5/16: Unfortunately admissions coordinator at [facility] health care notified us that the bed offer was rescinded.  Multiple reasons given 5/17: Notified by TOC that [facility] is also declined placement.  Patient was reluctant to leave the area   Assessment & Plan:   Principal Problem:   Severe hypothyroidism Active Problems:   Malnutrition of moderate degree (HCC)   Generalized weakness   Ulcer of right ankle (HCC)   Hypotension due to hypovolemia   Acute renal failure due to tubular necrosis (HCC)    Hypothyroidism   Multiple wounds   Bacteremia due to Pseudomonas   Lymphedema associated with obesity  Septic shock secondary to staph bacteremia with repeat culture showing Pseudomonas, POA:  Shock physiology as long resolved.  Patient was previously on midodrine and stress dose steroids for blood pressure support.  These have been weaned off.  Initial blood cultures positive for Staphylococcus capitis.  Subsequently patient developed recurrent fevers.  Repeat blood culture positive for Pseudomonas.  Source appears to be multiple skin infections.  Cultured by ID. Plan: Continue meropenem with pharmacy dosing assistance.  Infectious disease following.  Monitor vitals and fever curve.  End date antibiotics will be 5/25.  May end up keeping patient in the hospital for completion of antibiotics as we do not have placement option at this point  Left lower extremity DVT.   -Noted on ultrasound 5/8  -Started Xarelto 5/11  -No respiratory symptoms, no indication for VQ scan, given patient's bedbound status he remains remarkably high risk for recurrent DVT.   AKI:  Likely ATN in the setting of septic shock, improved Bicarb drip discontinued 5/17 P.o. bicarb initiated Ultrasound unremarkable, urine output remains appropriate Nephrology consulted, no indication for dialysis   Chronic combined systolic /diastolic CHF:  Echo done on 4/24 showed EF of 35 to 40%, global hypokinesis, grade 1 diastolic dysfunction.   Appears euvolemic   Hypokalemia: Improved.  Continue to monitor and replete as needed.   Severe hypothyroidism:  Completed IV Synthroid dosing, continue p.o. Synthroid.   Recommend follow-up thyroid function test in 4 to 6 weeks.   Leukocytosis/thrombocytosis: Resolved, questionably reactive given above.   Anemia of chronic disease:  Status post 3 unit PRBC this hospitalization Low threshold for imaging given newly prescribed Xarelto  Chronic sinus tachycardia:  Chronic problem.   QTc also prolonged.  Avoid QTc prolonging medications.  On low-dose metoprolol   Depression: Continue Remeron   Chronic abdomen pain/diarrhea :He complains of chronic abdominal pain and fear of eating as it precipitates diarrhea.  Continue Creon - appetite/diarrhea and bloating sensations appear to be improving.  Patient tolerating p.o. intake   Morbid obesity/bedbound status: PT/OT recommending.  Long-term facility.  TOC following.  Unfortunately bed offer at [facility] health care was rescinded for reasons that are unclear.  They have cited no capacity however they have previously offered a bed so this response is dubious at best.  [facility] offer a bed.  Bed search has been expanded.  If no acceptable options can be found we may need to discharge patient home   Goals of care: Morbidly obese patient with multiple medical problems.  History of bariatric surgery, OSA, obesity hypoventilation syndrome, chronic lymphedema, status post IVC filter placement. bedbound status.  Palliative care was following for goals of care, remains full code.  Will need long-term care facility.  May be appropriate candidate for long-term acute care hospital  DVT prophylaxis: Xarelto Code Status: Full Family Communication: None Disposition Plan: Status is: Inpatient Remains inpatient appropriate because: Pseudomonas bacteremia on IV antibiotics   Level of care: Med-Surg  Consultants:  Nephrology ID  Procedures:  None  Antimicrobials: Meropenem   Subjective: Seen and examined.  Complains of pain and stiffness in bilateral hands today.  Objective: Vitals:   05/15/23 0458 05/15/23 0500 05/15/23 0537 05/15/23 0834  BP:  113/79  108/78  Pulse:  (!) 110 96 (!) 112  Resp:  18  18  Temp:  98 F (36.7 C)  98.1 F (36.7 C)  TempSrc:  Oral  Oral  SpO2:  100%  100%  Weight: 78.7 kg     Height:        Intake/Output Summary (Last 24 hours) at 05/15/2023  1054 Last data filed at 05/15/2023 0100 Gross per 24 hour  Intake 1332.68 ml  Output --  Net 1332.68 ml   Filed Weights   05/13/23 0522 05/14/23 0437 05/15/23 0458  Weight: (!) 174.6 kg (!) 175.2 kg 78.7 kg    Examination:  General exam: No acute distress.  Appears chronically ill Respiratory system: Poor respiratory effort.  Lung sounds decreased at bases.  Normal work of breathing.  Room air Cardiovascular system: S1-S2, tachycardic, regular rhythm, no murmurs, lymphedema noted bilateral lower extremities Gastrointestinal system: Obese, soft, NT/ND Central nervous system: Alert and oriented. No focal neurological deficits. Extremities: Markedly decreased power bilateral upper and lower extremities Skin: Multiple areas of skin lesions and breakdown in various levels of healing Psychiatry: Judgement and insight appear normal. Mood & affect appropriate.     Data Reviewed: I have personally reviewed following labs and imaging studies  CBC: Recent Labs  Lab 05/10/23 0546 05/10/23 1004 05/11/23 0641 05/12/23 0755 05/13/23 0859 05/14/23 0600  WBC 15.8*  --  13.2* 10.4 10.2 10.7*  NEUTROABS 14.4*  --  11.1* 9.0* 8.1* 8.1*  HGB 6.5* 7.6* 7.9* 8.4* 8.4* 8.1*  HCT 19.6* 22.6* 23.5* 25.6* 25.0* 24.8*  MCV 86.7  --  84.8 85.9 85.0 87.0  PLT 168  --  207 223 251 276   Basic Metabolic Panel: Recent Labs  Lab 05/09/23 0612 05/10/23 0546 05/11/23 0641 05/12/23 0755 05/13/23 0859 05/14/23 0600  NA 134* 136 137  --  136 135  K 3.4* 3.5  3.4*  --  3.2* 3.7  CL 111 110 110  --  107 107  CO2 16* 17* 18*  --  20* 18*  GLUCOSE 115* 104* 89  --  84 87  BUN 63* 62* 61*  --  60* 59*  CREATININE 3.03* 3.03* 3.05*  --  2.70* 2.37*  CALCIUM 8.1* 8.1* 8.2*  --  8.0* 8.0*  PHOS 3.3 3.5 3.7 3.1 3.0 2.7   GFR: Estimated Creatinine Clearance: 38.1 mL/min (A) (by C-G formula based on SCr of 2.37 mg/dL (H)). Liver Function Tests: Recent Labs  Lab 05/14/23 0600  AST 15  ALT 14   ALKPHOS 54  BILITOT 0.6  PROT 4.7*  ALBUMIN 2.3*   No results for input(s): "LIPASE", "AMYLASE" in the last 168 hours. No results for input(s): "AMMONIA" in the last 168 hours. Coagulation Profile: No results for input(s): "INR", "PROTIME" in the last 168 hours. Cardiac Enzymes: No results for input(s): "CKTOTAL", "CKMB", "CKMBINDEX", "TROPONINI" in the last 168 hours. BNP (last 3 results) No results for input(s): "PROBNP" in the last 8760 hours. HbA1C: No results for input(s): "HGBA1C" in the last 72 hours. CBG: Recent Labs  Lab 05/11/23 0951 05/11/23 1139 05/11/23 1619 05/12/23 0656 05/12/23 1205  GLUCAP 86 83 75 96 87   Lipid Profile: No results for input(s): "CHOL", "HDL", "LDLCALC", "TRIG", "CHOLHDL", "LDLDIRECT" in the last 72 hours. Thyroid Function Tests: No results for input(s): "TSH", "T4TOTAL", "FREET4", "T3FREE", "THYROIDAB" in the last 72 hours. Anemia Panel: No results for input(s): "VITAMINB12", "FOLATE", "FERRITIN", "TIBC", "IRON", "RETICCTPCT" in the last 72 hours. Sepsis Labs: Recent Labs  Lab 05/09/23 0700 05/09/23 0755  LATICACIDVEN 0.8 0.7    Recent Results (from the past 240 hour(s))  Culture, blood (Routine X 2) w Reflex to ID Panel     Status: Abnormal   Collection Time: 05/09/23  9:09 AM   Specimen: BLOOD  Result Value Ref Range Status   Specimen Description   Final    BLOOD A-LINE Performed at [facility], [name] [place], [place] [address]    Special Requests   Final    BOTTLES DRAWN AEROBIC AND ANAEROBIC AEROMONAS CAVIAE Performed at [facility], [address] [place], [place] [address]    Culture  Setup Time   Final    GRAM NEGATIVE RODS IN BOTH AEROBIC AND ANAEROBIC BOTTLES CRITICAL RESULT CALLED TO, READ BACK BY AND VERIFIED WITH: JASON ROBINS @0420  05/10/23 [facility]    Culture PSEUDOMONAS AERUGINOSA (A)  Final   Report Status 05/12/2023 FINAL  Final   Organism ID, Bacteria PSEUDOMONAS AERUGINOSA  Final       Susceptibility   Pseudomonas aeruginosa - MIC*    CEFTAZIDIME 8 SENSITIVE Sensitive     CIPROFLOXACIN 1 INTERMEDIATE Intermediate     GENTAMICIN <=1 SENSITIVE Sensitive     IMIPENEM 2 SENSITIVE Sensitive     * PSEUDOMONAS AERUGINOSA  Culture, blood (Routine X 2) w Reflex to ID Panel     Status: None   Collection Time: 05/09/23  9:09 AM   Specimen: BLOOD LEFT ARM  Result Value Ref Range Status   Specimen Description BLOOD LEFT ARM  Final   Special Requests   Final    BOTTLES DRAWN AEROBIC ONLY Blood Culture adequate volume   Culture   Final    NO GROWTH 5 DAYS Performed at [facility], [address] [place], [place] [address]    Report Status 05/14/2023 FINAL  Final  Blood  Culture ID Panel (Reflexed)     Status: Abnormal   Collection Time: 05/09/23  9:09 AM  Result Value Ref Range Status   Enterococcus faecalis NOT DETECTED NOT DETECTED Final   Enterococcus Faecium NOT DETECTED NOT DETECTED Final   Listeria monocytogenes NOT DETECTED NOT DETECTED Final   Staphylococcus species NOT DETECTED NOT DETECTED Final   Staphylococcus aureus (BCID) NOT DETECTED NOT DETECTED Final   Staphylococcus epidermidis NOT DETECTED NOT DETECTED Final   Staphylococcus lugdunensis NOT DETECTED NOT DETECTED Final   Streptococcus species NOT DETECTED NOT DETECTED Final   Streptococcus agalactiae NOT DETECTED NOT DETECTED Final   Streptococcus pneumoniae NOT DETECTED NOT DETECTED Final   Streptococcus pyogenes NOT DETECTED NOT DETECTED Final   A.calcoaceticus-baumannii NOT DETECTED NOT DETECTED Final   Bacteroides fragilis NOT DETECTED NOT DETECTED Final   Enterobacterales NOT DETECTED NOT DETECTED Final   Enterobacter cloacae complex NOT DETECTED NOT DETECTED Final   Escherichia coli NOT DETECTED NOT DETECTED Final   Klebsiella aerogenes NOT DETECTED NOT DETECTED Final   Klebsiella oxytoca NOT DETECTED NOT DETECTED Final   Klebsiella pneumoniae NOT DETECTED NOT DETECTED Final    Proteus species NOT DETECTED NOT DETECTED Final   Salmonella species NOT DETECTED NOT DETECTED Final   Serratia marcescens NOT DETECTED NOT DETECTED Final   Haemophilus influenzae NOT DETECTED NOT DETECTED Final   Neisseria meningitidis NOT DETECTED NOT DETECTED Final   Pseudomonas aeruginosa DETECTED (A) NOT DETECTED Final    Comment: CRITICAL RESULT CALLED TO, READ BACK BY AND VERIFIED WITH: JASON ROBINS @ 0420 05/10/23 [facility]    Stenotrophomonas maltophilia NOT DETECTED NOT DETECTED Final   Candida albicans NOT DETECTED NOT DETECTED Final   Candida auris NOT DETECTED NOT DETECTED Final   Candida glabrata NOT DETECTED NOT DETECTED Final   Candida krusei NOT DETECTED NOT DETECTED Final   Candida parapsilosis NOT DETECTED NOT DETECTED Final   Candida tropicalis NOT DETECTED NOT DETECTED Final   Cryptococcus neoformans/gattii NOT DETECTED NOT DETECTED Final   CTX-M ESBL NOT DETECTED NOT DETECTED Final   Carbapenem resistance IMP NOT DETECTED NOT DETECTED Final   Carbapenem resistance KPC NOT DETECTED NOT DETECTED Final   Carbapenem resistance NDM NOT DETECTED NOT DETECTED Final   Carbapenem resistance VIM NOT DETECTED NOT DETECTED Final    Comment: Performed at [facility], [address] [place], [place] [address]  Culture, blood (Routine X 2) w Reflex to ID Panel     Status: None (Preliminary result)   Collection Time: 05/11/23 11:08 AM   Specimen: BLOOD  Result Value Ref Range Status   Specimen Description BLOOD LEFT [facility]  Final   Special Requests   Final    BOTTLES DRAWN AEROBIC AND ANAEROBIC Blood Culture adequate volume   Culture   Final    NO GROWTH 4 DAYS Performed at [facility], [address] [place], [place] [address]    Report Status PENDING  Incomplete  Culture, blood (Routine X 2) w Reflex to ID Panel     Status: None (Preliminary result)   Collection Time: 05/11/23 11:08 AM   Specimen: BLOOD  Result Value Ref Range Status   Specimen Description  BLOOD LEFT WRIST  Final   Special Requests   Final    BOTTLES DRAWN AEROBIC AND ANAEROBIC Blood Culture adequate volume   Culture   Final    NO GROWTH 4 DAYS Performed at [facility], [address], [place], [place] [address]    Report Status  PENDING  Incomplete  Aerobic Culture w Gram Stain (superficial specimen)     Status: None   Collection Time: 05/11/23  2:25 PM   Specimen: Thigh; Wound  Result Value Ref Range Status   Specimen Description   Final    THIGH Performed at [facility], [address] [place], [place] [address]    Special Requests   Final    LEFT THIGH Performed at [facility], [address] [place], [place] [address]    Gram Stain   Final    NO WBC SEEN NO ORGANISMS SEEN Performed at [facility], [address] [place], [place] [address]    Culture RARE PSEUDOMONAS AERUGINOSA  Final   Report Status 05/14/2023 FINAL  Final   Organism ID, Bacteria PSEUDOMONAS AERUGINOSA  Final      Susceptibility   Pseudomonas aeruginosa - MIC*    CEFTAZIDIME 16 INTERMEDIATE Intermediate     CIPROFLOXACIN 1 INTERMEDIATE Intermediate     GENTAMICIN <=1 SENSITIVE Sensitive     IMIPENEM 2 SENSITIVE Sensitive     * RARE PSEUDOMONAS AERUGINOSA  Aerobic Culture w Gram Stain (superficial specimen)     Status: None   Collection Time: 05/11/23  2:30 PM   Specimen: Back; Wound  Result Value Ref Range Status   Specimen Description   Final    BACK Performed at [facility], [address], [place], [place] [address]    Special Requests   Final    BACK Performed at [facility], [address] [place], [place] [address]    Gram Stain   Final    NO WBC SEEN NO ORGANISMS SEEN Performed at [facility], [address], [place], [place] [address]    Culture   Final    FEW PSEUDOMONAS AERUGINOSA FEW ACINETOBACTER BAUMANNII    Report Status 05/14/2023 FINAL  Final   Organism ID, Bacteria PSEUDOMONAS  AERUGINOSA  Final   Organism ID, Bacteria ACINETOBACTER BAUMANNII  Final      Susceptibility   Acinetobacter baumannii - MIC*    CEFTAZIDIME >=64 RESISTANT Resistant     CIPROFLOXACIN >=4 RESISTANT Resistant     GENTAMICIN >=16 RESISTANT Resistant     IMIPENEM 1 SENSITIVE Sensitive     PIP/TAZO >=128 RESISTANT Resistant     TRIMETH/SULFA >=320 RESISTANT Resistant     AMPICILLIN/SULBACTAM 4 SENSITIVE Sensitive     * FEW ACINETOBACTER BAUMANNII   Pseudomonas aeruginosa - MIC*    CEFTAZIDIME 16 INTERMEDIATE Intermediate     CIPROFLOXACIN 1 INTERMEDIATE Intermediate     GENTAMICIN 2 SENSITIVE Sensitive     IMIPENEM 2 SENSITIVE Sensitive     * FEW PSEUDOMONAS AERUGINOSA         Radiology Studies: No results found.      Scheduled Meds:  vitamin C  500 mg Oral BID   Chlorhexidine Gluconate Cloth  6 each Topical Daily   copper  8 mg Oral Daily   feeding supplement  1 Container Oral TID BM   folic acid  1 mg Oral Daily   hydrocerin   Topical BID   lactase  6,000 Units Oral TID WC   levothyroxine  100 mcg Oral Q0600   lipase/protease/amylase  24,000 Units Oral TID AC   metoprolol succinate  12.5 mg Oral Daily   mirtazapine  15 mg Oral QHS   multivitamin with minerals  1 tablet Oral BID   pantoprazole  40 mg Oral QHS  Rivaroxaban  15 mg Oral BID WC   Followed by   Melene Muller ON 05/30/2023] rivaroxaban  20 mg Oral Q supper   saccharomyces boulardii  250 mg Oral BID   sodium bicarbonate  650 mg Oral BID   thiamine  100 mg Oral Daily   vitamin A  10,000 Units Oral Daily   Vitamin D (Ergocalciferol)  50,000 Units Oral Q7 days   Vitamin E  400 Units Oral Daily   zinc sulfate  220 mg Oral Daily   Continuous Infusions:  meropenem (MERREM) IV 1 g (05/15/23 0944)     LOS: 24 days     Tresa Moore, MD [facility]   If 7PM-7AM, please contact night-coverage  05/15/2023, 10:54 AM

## 2023-05-15 NOTE — Plan of Care (Signed)

## 2023-05-15 NOTE — TOC Progression Note (Addendum)
Transition of Care [facility]) - Progression Note    Patient Details  Name: Christopher Herman MRN: [id number] Date of Birth: 11-13-1976  Transition of [facility]) CM/SW Contact  Liliana Cline, LCSW Phone Number: 05/15/2023, 11:24 AM  Clinical Narrative:    Per MD, patient agreeable to extending bed search to see if gets bed offers. Extended bed search.    Expected Discharge Plan: Skilled Nursing Facility Barriers to Discharge: Continued Medical Work up  Expected Discharge Plan and Services     Post Acute Care Choice: Skilled Nursing Facility Living arrangements for the past 2 months: Single Family Home                                       Social Determinants of Health (SDOH) Interventions SDOH Screenings   Food Insecurity: No Food Insecurity (11/04/2022)  Housing: Low Risk  (11/04/2022)  Transportation Needs: No Transportation Needs (11/04/2022)  Alcohol Screen: Low Risk  (09/13/2018)  Depression (PHQ2-9): Low Risk  (11/04/2022)  Physical Activity: Inactive (11/04/2022)  Tobacco Use: Medium Risk (04/21/2023)    Readmission Risk Interventions     No data to display

## 2023-05-15 NOTE — Progress Notes (Signed)
Date of Admission:  04/21/2023     ID: Christopher Herman is a 47 y.o. male Principal Problem:   Severe hypothyroidism Active Problems:   Malnutrition of moderate degree (HCC)   Generalized weakness   Ulcer of right ankle (HCC)   Hypotension due to hypovolemia   Acute renal failure due to tubular necrosis (HCC)   Hypothyroidism   Multiple wounds   Bacteremia due to Pseudomonas   Lymphedema associated with obesity    Subjective: Pt doing okay  Medications:   vitamin C  500 mg Oral BID   Chlorhexidine Gluconate Cloth  6 each Topical Daily   copper  8 mg Oral Daily   feeding supplement  1 Container Oral TID BM   folic acid  1 mg Oral Daily   hydrocerin   Topical BID   lactase  6,000 Units Oral TID WC   levothyroxine  100 mcg Oral Q0600   lipase/protease/amylase  24,000 Units Oral TID AC   metoprolol succinate  12.5 mg Oral Daily   mirtazapine  15 mg Oral QHS   multivitamin with minerals  1 tablet Oral BID   pantoprazole  40 mg Oral QHS   Rivaroxaban  15 mg Oral BID WC   Followed by   Melene Muller ON 05/30/2023] rivaroxaban  20 mg Oral Q supper   saccharomyces boulardii  250 mg Oral BID   sodium bicarbonate  650 mg Oral BID   thiamine  100 mg Oral Daily   vitamin A  10,000 Units Oral Daily   Vitamin D (Ergocalciferol)  50,000 Units Oral Q7 days   Vitamin E  400 Units Oral Daily   zinc sulfate  220 mg Oral Daily    Objective: Vital signs in last 24 hours: Patient Vitals for the past 24 hrs:  BP Temp Temp src Pulse Resp SpO2 Weight  05/15/23 0834 108/78 98.1 F (36.7 C) Oral (!) 112 18 100 % --  05/15/23 0537 -- -- -- 96 -- -- --  05/15/23 0500 113/79 98 F (36.7 C) Oral (!) 110 18 100 % --  05/15/23 0458 -- -- -- -- -- -- 78.7 kg  05/14/23 2118 108/79 98.2 F (36.8 C) Oral 97 18 100 % --  05/14/23 1837 121/85 98.7 F (37.1 C) Oral (!) 110 18 100 % --  05/14/23 1553 111/75 -- -- -- 18 100 % --  05/14/23 1253 102/78 98.4 F (36.9 C) -- 100 16 99 % --      PHYSICAL  EXAM:  General: Alert, cooperative, no distress, appears stated age.   lungs: Clear to auscultation bilaterally. No Wheezing or Rhonchi. No rales. Heart: Regular rate and rhythm, no murmur, rub or gallop. Abdomen: Soft, non-tender,not distended. Bowel sounds normal. No masses Extremities: Lymphedema legs         Skin: purpuric scratch marks over rt shoulder     Lymph: Cervical, supraclavicular normal. Neurologic: weakness all extremities  Lab Results    Latest Ref Rng & Units 05/14/2023    6:00 AM 05/13/2023    8:59 AM 05/12/2023    7:55 AM  CBC  WBC 4.0 - 10.5 K/uL 10.7  10.2  10.4   Hemoglobin 13.0 - 17.0 g/dL 8.1  8.4  8.4   Hematocrit 39.0 - 52.0 % 24.8  25.0  25.6   Platelets 150 - 400 K/uL 276  251  223        Latest Ref Rng & Units 05/14/2023    6:00 AM 05/13/2023  8:59 AM 05/11/2023    6:41 AM  CMP  Glucose 70 - 99 mg/dL 87  84  89   BUN 6 - 20 mg/dL 59  60  61   Creatinine 0.61 - 1.24 mg/dL 1.61  0.96  0.45   Sodium 135 - 145 mmol/L 135  136  137   Potassium 3.5 - 5.1 mmol/L 3.7  3.2  3.4   Chloride 98 - 111 mmol/L 107  107  110   CO2 22 - 32 mmol/L 18  20  18    Calcium 8.9 - 10.3 mg/dL 8.0  8.0  8.2   Total Protein 6.5 - 8.1 g/dL 4.7     Total Bilirubin 0.3 - 1.2 mg/dL 0.6     Alkaline Phos 38 - 126 U/L 54     AST 15 - 41 U/L 15     ALT 0 - 44 U/L 14         Microbiology: Wound culture pseudomonas   Assessment/Plan: pseudomonas bacteremia - due to skin wounds infected with pseudomonas.  .    central line  has been removed Pt is on meropenem will need for 10-14 days-until  05/23/23   Fever and leucocytosis resolved    Severe hypothyroidism- new diagnosis and is on on  synthroid On admission was hypothermic, hyponatremic thought to be due to above   Shock- sepsis VS SIR - resolved   Worsening AKI- creatinine plateud at 3.05   Severe obesity   Gastric bypass Copper, zinc, vitamin A.vitamin C, Vitamin D  levels low   Anemia  New  Pruritus- likely due to dry skin Scratch marks from nails on his rt shoulder- no rash otherwise Also has low levels of vit A which can cause ichthyosis- being supplemented   Prolonged clearance of vanco, with increase in levels even after the antibiotic was stopped for nearly 2 weeks     Discussed the management with the patient and  hospitalist. ID will sign off- call if needed

## 2023-05-15 NOTE — Progress Notes (Signed)
[facility]  PROGRESS NOTE   Subjective:   No labs today.   Reports eating better.   Objective:  Vital signs: Blood pressure 108/78, pulse (!) 112, temperature 98.1 F (36.7 C), temperature source Oral, resp. rate 18, height 5\' 5"  (1.651 m), weight 78.7 kg, SpO2 100 %.  Intake/Output Summary (Last 24 hours) at 05/15/2023 1255 Last data filed at 05/15/2023 0100 Gross per 24 hour  Intake 1332.68 ml  Output --  Net 1332.68 ml    Filed Weights   05/13/23 0522 05/14/23 0437 05/15/23 0458  Weight: (!) 174.6 kg (!) 175.2 kg 78.7 kg     Physical Exam: General:  No acute distress  Head:  Normocephalic, atraumatic. Moist oral mucosal membranes  Eyes:  Anicteric  Neck:  Supple  Lungs:   Clear to auscultation, normal effort  Heart:  S1S2 no rubs  Abdomen:   Soft, nontender, bowel sounds present  Extremities:  2+ peripheral edema with chronic lymphedema  Neurologic:  Awake, alert, following commands  Skin:  No lesions  :     Basic Metabolic Panel: Recent Labs  Lab 05/09/23 0612 05/10/23 0546 05/11/23 0641 05/12/23 0755 05/13/23 0859 05/14/23 0600  NA 134* 136 137  --  136 135  K 3.4* 3.5 3.4*  --  3.2* 3.7  CL 111 110 110  --  107 107  CO2 16* 17* 18*  --  20* 18*  GLUCOSE 115* 104* 89  --  84 87  BUN 63* 62* 61*  --  60* 59*  CREATININE 3.03* 3.03* 3.05*  --  2.70* 2.37*  CALCIUM 8.1* 8.1* 8.2*  --  8.0* 8.0*  PHOS 3.3 3.5 3.7 3.1 3.0 2.7    GFR: Estimated Creatinine Clearance: 38.1 mL/min (A) (by C-G formula based on SCr of 2.37 mg/dL (H)).  Liver Function Tests: Recent Labs  Lab 05/14/23 0600  AST 15  ALT 14  ALKPHOS 54  BILITOT 0.6  PROT 4.7*  ALBUMIN 2.3*   No results for input(s): "LIPASE", "AMYLASE" in the last 168 hours. No results for input(s): "AMMONIA" in the last 168 hours.  CBC: Recent Labs  Lab 05/10/23 0546 05/10/23 1004 05/11/23 0641 05/12/23 0755 05/13/23 0859 05/14/23 0600  WBC 15.8*  --  13.2* 10.4 10.2 10.7*   NEUTROABS 14.4*  --  11.1* 9.0* 8.1* 8.1*  HGB 6.5* 7.6* 7.9* 8.4* 8.4* 8.1*  HCT 19.6* 22.6* 23.5* 25.6* 25.0* 24.8*  MCV 86.7  --  84.8 85.9 85.0 87.0  PLT 168  --  207 223 251 276      HbA1C: Hemoglobin A1C  Date/Time Value Ref Range Status  02/11/2012 12:27 PM 5.8 4.2 - 6.3 % Final    Comment:    The American Diabetes Association recommends that a primary goal of therapy should be <7% and that physicians should reevaluate the treatment regimen in patients with HbA1c values consistently >8%.     Urinalysis: No results for input(s): "COLORURINE", "LABSPEC", "PHURINE", "GLUCOSEU", "HGBUR", "BILIRUBINUR", "KETONESUR", "PROTEINUR", "UROBILINOGEN", "NITRITE", "LEUKOCYTESUR" in the last 72 hours.  Invalid input(s): "APPERANCEUR"     Imaging: DG Hand 2 View Right  Result Date: 05/15/2023 CLINICAL DATA:  Pain and swelling of the bilateral hands. EXAM: RIGHT HAND - 2 VIEW COMPARISON:  None Available. FINDINGS: There is mild-to-moderate medial downsloping of the distal radius, symmetric with the contralateral hand imaged the same day and likely congenital. 5 mm ulnar negative variance. There is possible subluxation of the base of the thumb metacarpal with  respect of the trapezium given increased lateralization and angulation on the provided oblique view, however the joint appears normally aligned on frontal view. Note is made there is a greater appearance of possible subluxation versus dislocation of the contralateral LEFT thumb carpometacarpal joint imaged the same day. No acute fracture. IMPRESSION: 1. Mild-to-moderate medial downsloping of the distal radius, symmetric with the contralateral hand imaged the same day and likely congenital. 2. There is 5 mm ulnar negative variance. 3. On oblique view there appears to be exaggerated angulation and lateralization of the thumb metacarpal with respect to the trapezium, however the joint appears normal on frontal view. No definite subluxation.  Note is made there are findings more suggestive of at least subluxation of the contralateral LEFT thumb carpometacarpal joint on same-day radiographs. Electronically Signed   By: Neita Garnet M.D.   On: 05/15/2023 11:08   DG Hand 2 View Left  Result Date: 05/15/2023 CLINICAL DATA:  Bilateral hand pain and swelling. EXAM: LEFT HAND - 2 VIEW COMPARISON:  None Available. FINDINGS: There is decreased bone mineralization, greatest around the joint spaces. There is mild-to-moderate medial downsloping of the distal radius, symmetric with the contralateral side imaged the same day and likely congenital. 5 mm ulnar negative variance. Mild distal radioulnar joint space narrowing and peripheral osteophytosis. There is more lateral and, dorsal, and proximal positioning of the base of the thumb metacarpal than expected compared to the trapezium, and there appears to be a high-grade subluxation versus dislocation of the thumb carpometacarpal joint, best seen on lateral view. There is similar but less severe angulation of the right thumb carpometacarpal joint, imaged the same day. Mild-to-moderate triscaphe and second carpometacarpal joint space narrowing and subchondral cystic change. Mild interphalangeal joint space narrowing diffusely. No acute fracture is seen. IMPRESSION: 1. There appears to be a high-grade subluxation versus dislocation of the thumb carpometacarpal joint, best seen on lateral view. This is similar to but more severe than angulation of the contralateral right thumb carpometacarpal joint on same-day radiographs, and therefore may represent some congenital angulation of this joint. However, recommend clinical correlation for possible dislocation. 2. Mild-to-moderate triscaphe and second carpometacarpal joint space narrowing and subchondral cystic change. Electronically Signed   By: Neita Garnet M.D.   On: 05/15/2023 11:05     Medications:    meropenem (MERREM) IV 1 g (05/15/23 0944)    vitamin C   500 mg Oral BID   Chlorhexidine Gluconate Cloth  6 each Topical Daily   copper  8 mg Oral Daily   feeding supplement  1 Container Oral TID BM   folic acid  1 mg Oral Daily   hydrocerin   Topical BID   lactase  6,000 Units Oral TID WC   levothyroxine  100 mcg Oral Q0600   lipase/protease/amylase  24,000 Units Oral TID AC   metoprolol succinate  12.5 mg Oral Daily   mirtazapine  15 mg Oral QHS   multivitamin with minerals  1 tablet Oral BID   pantoprazole  40 mg Oral QHS   Rivaroxaban  15 mg Oral BID WC   Followed by   Melene Muller ON 05/30/2023] rivaroxaban  20 mg Oral Q supper   saccharomyces boulardii  250 mg Oral BID   sodium bicarbonate  650 mg Oral BID   thiamine  100 mg Oral Daily   vitamin A  10,000 Units Oral Daily   Vitamin D (Ergocalciferol)  50,000 Units Oral Q7 days   Vitamin E  400 Units Oral Daily  zinc sulfate  220 mg Oral Daily    Assessment/ Plan:     Mr.. Christopher Herman is a 47 y.o. male with morbid obesity status post bariatric surgery, sleep apnea, hypertension, GERD, bilateral lymphedema, DVT who presents to [facility] on 04/21/2023 for Severe hypothyroidism [E03.9] Generalized weakness [R53.1] Lower limb ulcer, ankle, right, with unspecified severity (HCC) [L97.319] Hypothyroidism, unspecified type [E03.9] Sepsis, due to unspecified organism, unspecified whether acute organ dysfunction present (HCC) [A41.9]  Patient's hospitalization has been complicated by DVT, pseudomonas bacteremia and acute kidney injury.    #1: Acute kidney injury with acute metabolic acidosis: secondary to vancomycin toxicity, sepsis and ATN.  Creatinine improving  - Change to PO sodium bicarbonate  - discontinue IV fluids   #2: Hypertension: Continue metoprolol.   #3: Congestive heart failure: volume status is acceptable.      LOS: 24 Lamont Dowdy, MD [facility] 5/17/202412:55 PM

## 2023-05-16 DIAGNOSIS — E039 Hypothyroidism, unspecified: Secondary | ICD-10-CM | POA: Diagnosis not present

## 2023-05-16 LAB — CULTURE, BLOOD (ROUTINE X 2)
Culture: NO GROWTH
Special Requests: ADEQUATE

## 2023-05-16 NOTE — TOC Progression Note (Signed)
Transition of Care [facility]) - Progression Note    Patient Details  Name: Christopher Herman MRN: [id number] Date of Birth: November 11, 1976  [facility]) CM/SW Contact  Liliana Cline, LCSW Phone Number: 05/16/2023, 9:50 AM  Clinical Narrative:    Patient has a bed offer at [facility]. However they do not have Admissions Workers on the weekends to verify bed offer and bed availability.    Expected Discharge Plan: Skilled Nursing Facility Barriers to Discharge: Continued Medical Work up  Expected Discharge Plan and Services     Post Acute Care Choice: Skilled Nursing Facility Living arrangements for the past 2 months: Single Family Home                                       Social Determinants of Health (SDOH) Interventions SDOH Screenings   Food Insecurity: No Food Insecurity (11/04/2022)  Housing: Low Risk  (11/04/2022)  Transportation Needs: No Transportation Needs (11/04/2022)  Alcohol Screen: Low Risk  (09/13/2018)  Depression (PHQ2-9): Low Risk  (11/04/2022)  Physical Activity: Inactive (11/04/2022)  Tobacco Use: Medium Risk (04/21/2023)    Readmission Risk Interventions     No data to display

## 2023-05-16 NOTE — Progress Notes (Signed)
PROGRESS NOTE    Christopher Herman  ZOX:[id number] DOB: 28-May-1976 DOA: 04/21/2023 PCP: Pcp, No    Brief Narrative:  Patient is a 48 y.o. male with a PMH significant for severe obesity s/p bariatric surgery, OSA, hypoventilation syndrome, HTN, GERD, cholelithiasis, bilateral extremity, chronic lymphedema ,status post IVC filter placement, IDA who presented from home to the ED on 04/21/2023 with generalized weakness, extreme fatigue, unintended weight loss, disuse of upper extremities and poor p.o. intake.Marland Kitchen He is chronically bed bound and malnourished.  Initially septic shock was suspected, admitted to ICU, started on broad-spectrum antibiotics.  Also required vasopressors in the interim but have not been discontinued. Hospital course remarkable for worsening kidney function.  Nephrology following, suspected to have ATN given septic shock.  PT/OT recommending SNF/long-term care.  TOC following.  Palliative care was also consulted for goals of care.  Remains full code.   5/8: DVT study positive 5/11: Febrile, repeat cultures sent, intermittent diarrhea noted.  Resume vancomycin pending cultures.  Initiate Xarelto 5/12: Blood culture with Pseudomonas-vancomycin/ceftriaxone changed to cefepime.  Hemoglobin decreased to 6.5, without overt bleeding. 1 unit of PRBC.   5/13-14: ID consulted for bacteremia with Pseudomonas aeruginosa.  5/15: Remains on meropenem for Pseudomonas bacteremia 5/16: Unfortunately admissions coordinator at [facility] notified us that the bed offer was rescinded.  Multiple reasons given 5/17: Notified by TOC that [facility] is also declined placement.  Patient was reluctant to leave the area   Assessment & Plan:   Principal Problem:   Severe hypothyroidism Active Problems:   Malnutrition of moderate degree (HCC)   Generalized weakness   Ulcer of right ankle (HCC)   Hypotension due to hypovolemia   Acute renal failure due to tubular necrosis (HCC)    Hypothyroidism   Multiple wounds   Bacteremia due to Pseudomonas   Lymphedema associated with obesity  Septic shock secondary to staph bacteremia with repeat culture showing Pseudomonas, POA:  Shock physiology has long resolved.  Patient was previously on midodrine and stress dose steroids for blood pressure support.  These have been weaned off.  Initial blood cultures positive for Staphylococcus capitis.  Subsequently patient developed recurrent fevers.  Repeat blood culture positive for Pseudomonas.  Source appears to be multiple skin infections.  Cultured by ID. Plan: Continue meropenem with pharmacy dosing assistance.  Infectious disease following.  Monitor vitals and fever curve.  End date antibiotics will be 5/25.  If and when we have verified placement we can consider long-term IV access placement  Left lower extremity DVT.   -Noted on ultrasound 5/8  -Started Xarelto 5/11  -No respiratory symptoms, no indication for VQ scan, given patient's bedbound status he remains remarkably high risk for recurrent DVT.   AKI:  Likely ATN in the setting of septic shock, improved Bicarb drip discontinued 5/17 P.o. bicarb initiated Ultrasound unremarkable, urine output remains appropriate Nephrology consulted, no indication for dialysis Kidney function improving   Chronic combined systolic /diastolic CHF:  Echo done on 4/24 showed EF of 35 to 40%, global hypokinesis, grade 1 diastolic dysfunction.   Appears euvolemic   Hypokalemia: Improved.  Continue to monitor and replete as needed.   Severe hypothyroidism:  Completed IV Synthroid dosing, continue p.o. Synthroid.   Recommend follow-up thyroid function test in 4 to 6 weeks.   Leukocytosis/thrombocytosis: Resolved, questionably reactive given above.   Anemia of chronic disease:  Status post 3 unit PRBC this hospitalization Low threshold for imaging given newly prescribed Xarelto   Chronic sinus tachycardia:  Chronic problem.  QTc also  prolonged.  Avoid QTc prolonging medications.  On low-dose metoprolol   Depression: Continue Remeron   Chronic abdomen pain/diarrhea :He complains of chronic abdominal pain and fear of eating as it precipitates diarrhea.  Continue Creon - appetite/diarrhea and bloating sensations appear to be improving.  Patient tolerating p.o. intake   Morbid obesity/bedbound status: PT/OT recommending.  Long-term facility.  TOC following.  Unfortunately bed offer at [facility] care was rescinded for reasons that are unclear.  They have cited no capacity however they have previously offered a bed so this response is dubious at best.  [facility] a bed.  Bed search has been expanded.  If no acceptable options can be found we may need to discharge patient home   Goals of care: Morbidly obese patient with multiple medical problems.  History of bariatric surgery, OSA, obesity hypoventilation syndrome, chronic lymphedema, status post IVC filter placement. bedbound status.  Palliative care was following for goals of care, remains full code.  Will need long-term care facility.  May be appropriate candidate for long-term acute care hospital  DVT prophylaxis: Xarelto Code Status: Full Family Communication: None Disposition Plan: Status is: Inpatient Remains inpatient appropriate because: Pseudomonas bacteremia on IV antibiotics   Level of care: Med-Surg  Consultants:  Nephrology ID  Procedures:  None  Antimicrobials: Meropenem   Subjective: Seen and examined.  No specific complaints this morning  Objective: Vitals:   05/15/23 0834 05/15/23 2033 05/16/23 0455 05/16/23 0800  BP: 108/78 103/75 108/76 (!) 95/50  Pulse: (!) 112 (!) 107 99 99  Resp: 18 18 18 18   Temp: 98.1 F (36.7 C) 98.1 F (36.7 C) 98.2 F (36.8 C) 98.1 F (36.7 C)  TempSrc: Oral Oral Oral Oral  SpO2: 100% 100% 100% 100%  Weight:      Height:        Intake/Output Summary  (Last 24 hours) at 05/16/2023 1102 Last data filed at 05/16/2023 0456 Gross per 24 hour  Intake --  Output 500 ml  Net -500 ml   Filed Weights   05/13/23 0522 05/14/23 0437 05/15/23 0458  Weight: (!) 174.6 kg (!) 175.2 kg 78.7 kg    Examination:  General exam: NAD.  Appears frail and chronically ill Respiratory system: Poor respiratory effort.  Normal work of breathing.  Room air Cardiovascular system: S1-S2, tachycardic, regular rhythm, no murmurs, lymphedema noted bilateral lower extremities Gastrointestinal system: Obese, soft, NT/ND Central nervous system: Alert and oriented. No focal neurological deficits. Extremities: Markedly decreased power bilateral upper and lower extremities Skin: Multiple areas of skin lesions and breakdown in various levels of healing Psychiatry: Judgement and insight appear normal. Mood & affect appropriate.     Data Reviewed: I have personally reviewed following labs and imaging studies  CBC: Recent Labs  Lab 05/10/23 0546 05/10/23 1004 05/11/23 0641 05/12/23 0755 05/13/23 0859 05/14/23 0600  WBC 15.8*  --  13.2* 10.4 10.2 10.7*  NEUTROABS 14.4*  --  11.1* 9.0* 8.1* 8.1*  HGB 6.5* 7.6* 7.9* 8.4* 8.4* 8.1*  HCT 19.6* 22.6* 23.5* 25.6* 25.0* 24.8*  MCV 86.7  --  84.8 85.9 85.0 87.0  PLT 168  --  207 223 251 276   Basic Metabolic Panel: Recent Labs  Lab 05/10/23 0546 05/11/23 0641 05/12/23 0755 05/13/23 0859 05/14/23 0600  NA 136 137  --  136 135  K 3.5 3.4*  --  3.2* 3.7  CL 110 110  --  107 107  CO2 17* 18*  --  20* 18*  GLUCOSE 104* 89  --  84 87  BUN 62* 61*  --  60* 59*  CREATININE 3.03* 3.05*  --  2.70* 2.37*  CALCIUM 8.1* 8.2*  --  8.0* 8.0*  PHOS 3.5 3.7 3.1 3.0 2.7   GFR: Estimated Creatinine Clearance: 38.1 mL/min (A) (by C-G formula based on SCr of 2.37 mg/dL (H)). Liver Function Tests: Recent Labs  Lab 05/14/23 0600  AST 15  ALT 14  ALKPHOS 54  BILITOT 0.6  PROT 4.7*  ALBUMIN 2.3*   No results for  input(s): "LIPASE", "AMYLASE" in the last 168 hours. No results for input(s): "AMMONIA" in the last 168 hours. Coagulation Profile: No results for input(s): "INR", "PROTIME" in the last 168 hours. Cardiac Enzymes: No results for input(s): "CKTOTAL", "CKMB", "CKMBINDEX", "TROPONINI" in the last 168 hours. BNP (last 3 results) No results for input(s): "PROBNP" in the last 8760 hours. HbA1C: No results for input(s): "HGBA1C" in the last 72 hours. CBG: Recent Labs  Lab 05/11/23 0951 05/11/23 1139 05/11/23 1619 05/12/23 0656 05/12/23 1205  GLUCAP 86 83 75 96 87   Lipid Profile: No results for input(s): "CHOL", "HDL", "LDLCALC", "TRIG", "CHOLHDL", "LDLDIRECT" in the last 72 hours. Thyroid Function Tests: No results for input(s): "TSH", "T4TOTAL", "FREET4", "T3FREE", "THYROIDAB" in the last 72 hours. Anemia Panel: No results for input(s): "VITAMINB12", "FOLATE", "FERRITIN", "TIBC", "IRON", "RETICCTPCT" in the last 72 hours. Sepsis Labs: No results for input(s): "PROCALCITON", "LATICACIDVEN" in the last 168 hours.   Recent Results (from the past 240 hour(s))  Culture, blood (Routine X 2) w Reflex to ID Panel     Status: Abnormal   Collection Time: 05/09/23  9:09 AM   Specimen: BLOOD  Result Value Ref Range Status   Specimen Description   Final    BLOOD A-LINE Performed at [facility], [address], [place], [place] [address]    Special Requests   Final    BOTTLES DRAWN AEROBIC AND ANAEROBIC AEROMONAS CAVIAE Performed at [facility], [address] [place], [place] [address]    Culture  Setup Time   Final    GRAM NEGATIVE RODS IN BOTH AEROBIC AND ANAEROBIC BOTTLES CRITICAL RESULT CALLED TO, READ BACK BY AND VERIFIED WITH: JASON ROBINS @0420  05/10/23 [facility]    Culture PSEUDOMONAS AERUGINOSA (A)  Final   Report Status 05/12/2023 FINAL  Final   Organism ID, Bacteria PSEUDOMONAS AERUGINOSA  Final      Susceptibility   Pseudomonas aeruginosa - MIC*     CEFTAZIDIME 8 SENSITIVE Sensitive     CIPROFLOXACIN 1 INTERMEDIATE Intermediate     GENTAMICIN <=1 SENSITIVE Sensitive     IMIPENEM 2 SENSITIVE Sensitive     * PSEUDOMONAS AERUGINOSA  Culture, blood (Routine X 2) w Reflex to ID Panel     Status: None   Collection Time: 05/09/23  9:09 AM   Specimen: BLOOD LEFT ARM  Result Value Ref Range Status   Specimen Description BLOOD LEFT ARM  Final   Special Requests   Final    BOTTLES DRAWN AEROBIC ONLY Blood Culture adequate volume   Culture   Final    NO GROWTH 5 DAYS Performed at [facility], [address] [place], [place] [address]    Report Status 05/14/2023 FINAL  Final  Blood Culture ID Panel (Reflexed)     Status: Abnormal   Collection Time: 05/09/23  9:09 AM  Result Value Ref  Range Status   Enterococcus faecalis NOT DETECTED NOT DETECTED Final   Enterococcus Faecium NOT DETECTED NOT DETECTED Final   Listeria monocytogenes NOT DETECTED NOT DETECTED Final   Staphylococcus species NOT DETECTED NOT DETECTED Final   Staphylococcus aureus (BCID) NOT DETECTED NOT DETECTED Final   Staphylococcus epidermidis NOT DETECTED NOT DETECTED Final   Staphylococcus lugdunensis NOT DETECTED NOT DETECTED Final   Streptococcus species NOT DETECTED NOT DETECTED Final   Streptococcus agalactiae NOT DETECTED NOT DETECTED Final   Streptococcus pneumoniae NOT DETECTED NOT DETECTED Final   Streptococcus pyogenes NOT DETECTED NOT DETECTED Final   A.calcoaceticus-baumannii NOT DETECTED NOT DETECTED Final   Bacteroides fragilis NOT DETECTED NOT DETECTED Final   Enterobacterales NOT DETECTED NOT DETECTED Final   Enterobacter cloacae complex NOT DETECTED NOT DETECTED Final   Escherichia coli NOT DETECTED NOT DETECTED Final   Klebsiella aerogenes NOT DETECTED NOT DETECTED Final   Klebsiella oxytoca NOT DETECTED NOT DETECTED Final   Klebsiella pneumoniae NOT DETECTED NOT DETECTED Final   Proteus species NOT DETECTED NOT DETECTED Final   Salmonella  species NOT DETECTED NOT DETECTED Final   Serratia marcescens NOT DETECTED NOT DETECTED Final   Haemophilus influenzae NOT DETECTED NOT DETECTED Final   Neisseria meningitidis NOT DETECTED NOT DETECTED Final   Pseudomonas aeruginosa DETECTED (A) NOT DETECTED Final    Comment: CRITICAL RESULT CALLED TO, READ BACK BY AND VERIFIED WITH: JASON ROBINS @ 0420 05/10/23 [facility]    Stenotrophomonas maltophilia NOT DETECTED NOT DETECTED Final   Candida albicans NOT DETECTED NOT DETECTED Final   Candida auris NOT DETECTED NOT DETECTED Final   Candida glabrata NOT DETECTED NOT DETECTED Final   Candida krusei NOT DETECTED NOT DETECTED Final   Candida parapsilosis NOT DETECTED NOT DETECTED Final   Candida tropicalis NOT DETECTED NOT DETECTED Final   Cryptococcus neoformans/gattii NOT DETECTED NOT DETECTED Final   CTX-M ESBL NOT DETECTED NOT DETECTED Final   Carbapenem resistance IMP NOT DETECTED NOT DETECTED Final   Carbapenem resistance KPC NOT DETECTED NOT DETECTED Final   Carbapenem resistance NDM NOT DETECTED NOT DETECTED Final   Carbapenem resistance VIM NOT DETECTED NOT DETECTED Final    Comment: Performed at [facility], [name] [place], [place] [address]  Culture, blood (Routine X 2) w Reflex to ID Panel     Status: None   Collection Time: 05/11/23 11:08 AM   Specimen: BLOOD  Result Value Ref Range Status   Specimen Description BLOOD LEFT [facility]  Final   Special Requests   Final    BOTTLES DRAWN AEROBIC AND ANAEROBIC Blood Culture adequate volume   Culture   Final    NO GROWTH 5 DAYS Performed at [facility], [address] [place], [place] [address]    Report Status 05/16/2023 FINAL  Final  Culture, blood (Routine X 2) w Reflex to ID Panel     Status: None   Collection Time: 05/11/23 11:08 AM   Specimen: BLOOD  Result Value Ref Range Status   Specimen Description BLOOD LEFT WRIST  Final   Special Requests   Final    BOTTLES DRAWN AEROBIC AND ANAEROBIC Blood  Culture adequate volume   Culture   Final    NO GROWTH 5 DAYS Performed at [facility], [address] [place], [place] [address]    Report Status 05/16/2023 FINAL  Final  Aerobic Culture w Gram Stain (superficial specimen)     Status: None   Collection Time: 05/11/23  2:25  PM   Specimen: Thigh; Wound  Result Value Ref Range Status   Specimen Description   Final    THIGH Performed at [facility], [address] [place], [place] [address]    Special Requests   Final    LEFT THIGH Performed at [facility], [address] [place], [place] [address]    Gram Stain   Final    NO WBC SEEN NO ORGANISMS SEEN Performed at [facility], [address], [place], [place] [address]    Culture RARE PSEUDOMONAS AERUGINOSA  Final   Report Status 05/14/2023 FINAL  Final   Organism ID, Bacteria PSEUDOMONAS AERUGINOSA  Final      Susceptibility   Pseudomonas aeruginosa - MIC*    CEFTAZIDIME 16 INTERMEDIATE Intermediate     CIPROFLOXACIN 1 INTERMEDIATE Intermediate     GENTAMICIN <=1 SENSITIVE Sensitive     IMIPENEM 2 SENSITIVE Sensitive     * RARE PSEUDOMONAS AERUGINOSA  Aerobic Culture w Gram Stain (superficial specimen)     Status: None   Collection Time: 05/11/23  2:30 PM   Specimen: Back; Wound  Result Value Ref Range Status   Specimen Description   Final    BACK Performed at [facility], [address] [place], [place] [address]    Special Requests   Final    BACK Performed at [facility], [address] [place], [place] [address]    Gram Stain   Final    NO WBC SEEN NO ORGANISMS SEEN Performed at [facility], [address] [place], [place] [address]    Culture   Final    FEW PSEUDOMONAS AERUGINOSA FEW ACINETOBACTER BAUMANNII    Report Status 05/14/2023 FINAL  Final   Organism ID, Bacteria PSEUDOMONAS AERUGINOSA  Final   Organism ID, Bacteria ACINETOBACTER BAUMANNII  Final      Susceptibility    Acinetobacter baumannii - MIC*    CEFTAZIDIME >=64 RESISTANT Resistant     CIPROFLOXACIN >=4 RESISTANT Resistant     GENTAMICIN >=16 RESISTANT Resistant     IMIPENEM 1 SENSITIVE Sensitive     PIP/TAZO >=128 RESISTANT Resistant     TRIMETH/SULFA >=320 RESISTANT Resistant     AMPICILLIN/SULBACTAM 4 SENSITIVE Sensitive     * FEW ACINETOBACTER BAUMANNII   Pseudomonas aeruginosa - MIC*    CEFTAZIDIME 16 INTERMEDIATE Intermediate     CIPROFLOXACIN 1 INTERMEDIATE Intermediate     GENTAMICIN 2 SENSITIVE Sensitive     IMIPENEM 2 SENSITIVE Sensitive     * FEW PSEUDOMONAS AERUGINOSA         Radiology Studies: DG Hand 2 View Right  Result Date: 05/15/2023 CLINICAL DATA:  Pain and swelling of the bilateral hands. EXAM: RIGHT HAND - 2 VIEW COMPARISON:  None Available. FINDINGS: There is mild-to-moderate medial downsloping of the distal radius, symmetric with the contralateral hand imaged the same day and likely congenital. 5 mm ulnar negative variance. There is possible subluxation of the base of the thumb metacarpal with respect of the trapezium given increased lateralization and angulation on the provided oblique view, however the joint appears normally aligned on frontal view. Note is made there is a greater appearance of possible subluxation versus dislocation of the contralateral LEFT thumb carpometacarpal joint imaged the same day. No acute fracture. IMPRESSION: 1. Mild-to-moderate medial downsloping of the distal radius, symmetric with the contralateral hand imaged the same day and likely congenital. 2. There is 5 mm ulnar negative variance. 3. On  oblique view there appears to be exaggerated angulation and lateralization of the thumb metacarpal with respect to the trapezium, however the joint appears normal on frontal view. No definite subluxation. Note is made there are findings more suggestive of at least subluxation of the contralateral LEFT thumb carpometacarpal joint on same-day radiographs.  Electronically Signed   By: Neita Garnet M.D.   On: 05/15/2023 11:08   DG Hand 2 View Left  Result Date: 05/15/2023 CLINICAL DATA:  Bilateral hand pain and swelling. EXAM: LEFT HAND - 2 VIEW COMPARISON:  None Available. FINDINGS: There is decreased bone mineralization, greatest around the joint spaces. There is mild-to-moderate medial downsloping of the distal radius, symmetric with the contralateral side imaged the same day and likely congenital. 5 mm ulnar negative variance. Mild distal radioulnar joint space narrowing and peripheral osteophytosis. There is more lateral and, dorsal, and proximal positioning of the base of the thumb metacarpal than expected compared to the trapezium, and there appears to be a high-grade subluxation versus dislocation of the thumb carpometacarpal joint, best seen on lateral view. There is similar but less severe angulation of the right thumb carpometacarpal joint, imaged the same day. Mild-to-moderate triscaphe and second carpometacarpal joint space narrowing and subchondral cystic change. Mild interphalangeal joint space narrowing diffusely. No acute fracture is seen. IMPRESSION: 1. There appears to be a high-grade subluxation versus dislocation of the thumb carpometacarpal joint, best seen on lateral view. This is similar to but more severe than angulation of the contralateral right thumb carpometacarpal joint on same-day radiographs, and therefore may represent some congenital angulation of this joint. However, recommend clinical correlation for possible dislocation. 2. Mild-to-moderate triscaphe and second carpometacarpal joint space narrowing and subchondral cystic change. Electronically Signed   By: Neita Garnet M.D.   On: 05/15/2023 11:05        Scheduled Meds:  vitamin C  500 mg Oral BID   Chlorhexidine Gluconate Cloth  6 each Topical Daily   copper  8 mg Oral Daily   feeding supplement  1 Container Oral TID BM   folic acid  1 mg Oral Daily   hydrocerin    Topical BID   lactase  6,000 Units Oral TID WC   levothyroxine  100 mcg Oral Q0600   lipase/protease/amylase  24,000 Units Oral TID AC   metoprolol succinate  12.5 mg Oral Daily   mirtazapine  15 mg Oral QHS   multivitamin with minerals  1 tablet Oral BID   pantoprazole  40 mg Oral QHS   Rivaroxaban  15 mg Oral BID WC   Followed by   Melene Muller ON 05/30/2023] rivaroxaban  20 mg Oral Q supper   saccharomyces boulardii  250 mg Oral BID   sodium bicarbonate  650 mg Oral BID   thiamine  100 mg Oral Daily   vitamin A  10,000 Units Oral Daily   Vitamin D (Ergocalciferol)  50,000 Units Oral Q7 days   Vitamin E  400 Units Oral Daily   zinc sulfate  220 mg Oral Daily   Continuous Infusions:  meropenem (MERREM) IV 1 g (05/16/23 0655)     LOS: 25 days     Tresa Moore, MD [facility]   If 7PM-7AM, please contact night-coverage  05/16/2023, 11:02 AM

## 2023-05-17 DIAGNOSIS — E039 Hypothyroidism, unspecified: Secondary | ICD-10-CM | POA: Diagnosis not present

## 2023-05-17 NOTE — Progress Notes (Signed)
Bedside report with Lesle Chris - patient requested to have heat turned down - request was completed

## 2023-05-17 NOTE — Progress Notes (Signed)
Administered medications - assessment, whole meds given with apple sauce, fed patient two crackers with [place] and ham along with several sips of water per patient's request. Patient also requested I push his upper body over as he had slumped over slightly. No further requestes made. Call bells x 2 within reach.

## 2023-05-17 NOTE — Progress Notes (Signed)
PROGRESS NOTE    Christopher Herman  ZOX:[id number] DOB: Mar 26, 1976 DOA: 04/21/2023 PCP: Pcp, No    Brief Narrative:  Patient is a 47 y.o. male with a PMH significant for severe obesity s/p bariatric surgery, OSA, hypoventilation syndrome, HTN, GERD, cholelithiasis, bilateral extremity, chronic lymphedema ,status post IVC filter placement, IDA who presented from home to the ED on 04/21/2023 with generalized weakness, extreme fatigue, unintended weight loss, disuse of upper extremities and poor p.o. intake.Marland Kitchen He is chronically bed bound and malnourished.  Initially septic shock was suspected, admitted to ICU, started on broad-spectrum antibiotics.  Also required vasopressors in the interim but have not been discontinued. Hospital course remarkable for worsening kidney function.  Nephrology following, suspected to have ATN given septic shock.  PT/OT recommending SNF/long-term care.  TOC following.  Palliative care was also consulted for goals of care.  Remains full code.   5/8: DVT study positive 5/11: Febrile, repeat cultures sent, intermittent diarrhea noted.  Resume vancomycin pending cultures.  Initiate Xarelto 5/12: Blood culture with Pseudomonas-vancomycin/ceftriaxone changed to cefepime.  Hemoglobin decreased to 6.5, without overt bleeding. 1 unit of PRBC.   5/13-14: ID consulted for bacteremia with Pseudomonas aeruginosa.  5/15: Remains on meropenem for Pseudomonas bacteremia 5/16: Unfortunately admissions coordinator at [facility] notified us that the bed offer was rescinded.  Multiple reasons given 5/17: Notified by TOC that [facility] also [facility] placement.  Patient was reluctant to leave the area   Assessment & Plan:   Principal Problem:   Severe hypothyroidism Active Problems:   Malnutrition of moderate degree (HCC)   Generalized weakness   Ulcer of right ankle (HCC)   Hypotension due to hypovolemia   Acute renal failure due to tubular necrosis (HCC)    Hypothyroidism   Multiple wounds   Bacteremia due to Pseudomonas   Lymphedema associated with obesity  Septic shock secondary to staph bacteremia with repeat culture showing Pseudomonas, POA:  Shock physiology has long resolved.  Patient was previously on midodrine and stress dose steroids for blood pressure support.  These have been weaned off.  Initial blood cultures positive for Staphylococcus capitis.  Subsequently patient developed recurrent fevers.  Repeat blood culture positive for Pseudomonas.  Source appears to be multiple skin infections.  Cultured by ID. Plan: Continue meropenem with pharmacy dosing assistance.  Infectious disease following.  Monitor vitals and fever curve.  Antibiotic end date 5/25.  If and when we have verified placement we can consider long-term IV access placement  Left lower extremity DVT.   -Noted on ultrasound 5/8  -Started Xarelto 5/11  -No respiratory symptoms, no indication for VQ scan, given patient's bedbound status he remains remarkably high risk for recurrent DVT.   AKI:  Likely ATN in the setting of septic shock, improved Bicarb drip discontinued 5/17 P.o. bicarb initiated Ultrasound unremarkable, urine output remains appropriate Nephrology consulted, no indication for dialysis Kidney function improving Recheck renal indices 5/20   Chronic combined systolic /diastolic CHF:  Echo done on 4/24 showed EF of 35 to 40%, global hypokinesis, grade 1 diastolic dysfunction.   Appears euvolemic   Hypokalemia: Improved.  Continue to monitor and replete as needed.   Severe hypothyroidism:  Completed IV Synthroid dosing, continue p.o. Synthroid.   Recommend follow-up thyroid function test in 4 to 6 weeks.   Leukocytosis/thrombocytosis: Resolved, questionably reactive given above.   Anemia of chronic disease:  Status post 3 unit PRBC this hospitalization Low threshold for imaging given newly prescribed Xarelto   Chronic  sinus tachycardia:  Chronic  problem.  QTc also prolonged.  Avoid QTc prolonging medications.  On low-dose metoprolol   Depression: Continue Remeron   Chronic abdomen pain/diarrhea :He complains of chronic abdominal pain and fear of eating as it precipitates diarrhea.  Continue Creon - appetite/diarrhea and bloating sensations appear to be improving.  Patient tolerating p.o. intake   Morbid obesity/bedbound status: PT/OT recommending.  Long-term facility.  TOC following.  Unfortunately bed offer at [facility] was rescinded for reasons that are unclear.  They have cited no capacity however they have previously offered a bed so this response is dubious at best.  [facility] a [facility].  Bed search has been expanded.  If no acceptable options can be found we may need to discharge patient home   Goals of care: Morbidly obese patient with multiple medical problems.  History of bariatric surgery, OSA, obesity hypoventilation syndrome, chronic lymphedema, status post IVC filter placement. bedbound status.  Palliative care was following for goals of care, remains full code.  Will need long-term care facility.  May be appropriate candidate for long-term acute care hospital  DVT prophylaxis: Xarelto Code Status: Full Family Communication: None Disposition Plan: Status is: Inpatient Remains inpatient appropriate because: Pseudomonas bacteremia on IV antibiotics   Level of care: Med-Surg  Consultants:  Nephrology ID  Procedures:  None  Antimicrobials: Meropenem   Subjective: Seen and examined.  No specific complaints this morning  Objective: Vitals:   05/16/23 1602 05/16/23 1927 05/17/23 0523 05/17/23 0800  BP: 111/74 111/80 112/74 105/75  Pulse: (!) 107 (!) 116 (!) 108 100  Resp: 18 20 20 18   Temp: 98.4 F (36.9 C) 98.9 F (37.2 C) 98.2 F (36.8 C) 98.1 F (36.7 C)  TempSrc:  Oral Oral   SpO2: 99% 99% 100% 100%  Weight:      Height:        Intake/Output  Summary (Last 24 hours) at 05/17/2023 1012 Last data filed at 05/17/2023 0931 Gross per 24 hour  Intake 840 ml  Output 700 ml  Net 140 ml   Filed Weights   05/13/23 0522 05/14/23 0437 05/15/23 0458  Weight: (!) 174.6 kg (!) 175.2 kg 78.7 kg    Examination:  General exam: No acute distress.  Appears fatigued Respiratory system: Poor respiratory effort.  Normal work of breathing.  Room air Cardiovascular system: S1-S2, tachycardic, regular rhythm, no murmurs, lymphedema noted bilateral lower extremities Gastrointestinal system: Obese, soft, NT/ND,+ bowel sounds Central nervous system: Alert and oriented. No focal neurological deficits. Extremities: Markedly decreased power bilateral upper and lower extremities Skin: Multiple areas of skin lesions and breakdown in various levels of healing Psychiatry: Judgement and insight appear normal. Mood & affect appropriate.     Data Reviewed: I have personally reviewed following labs and imaging studies  CBC: Recent Labs  Lab 05/11/23 0641 05/12/23 0755 05/13/23 0859 05/14/23 0600  WBC 13.2* 10.4 10.2 10.7*  NEUTROABS 11.1* 9.0* 8.1* 8.1*  HGB 7.9* 8.4* 8.4* 8.1*  HCT 23.5* 25.6* 25.0* 24.8*  MCV 84.8 85.9 85.0 87.0  PLT 207 223 251 276   Basic Metabolic Panel: Recent Labs  Lab 05/11/23 0641 05/12/23 0755 05/13/23 0859 05/14/23 0600  NA 137  --  136 135  K 3.4*  --  3.2* 3.7  CL 110  --  107 107  CO2 18*  --  20* 18*  GLUCOSE 89  --  84 87  BUN 61*  --  60* 59*  CREATININE 3.05*  --  2.70* 2.37*  CALCIUM 8.2*  --  8.0* 8.0*  PHOS 3.7 3.1 3.0 2.7   GFR: Estimated Creatinine Clearance: 38.1 mL/min (A) (by C-G formula based on SCr of 2.37 mg/dL (H)). Liver Function Tests: Recent Labs  Lab 05/14/23 0600  AST 15  ALT 14  ALKPHOS 54  BILITOT 0.6  PROT 4.7*  ALBUMIN 2.3*   No results for input(s): "LIPASE", "AMYLASE" in the last 168 hours. No results for input(s): "AMMONIA" in the last 168 hours. Coagulation  Profile: No results for input(s): "INR", "PROTIME" in the last 168 hours. Cardiac Enzymes: No results for input(s): "CKTOTAL", "CKMB", "CKMBINDEX", "TROPONINI" in the last 168 hours. BNP (last 3 results) No results for input(s): "PROBNP" in the last 8760 hours. HbA1C: No results for input(s): "HGBA1C" in the last 72 hours. CBG: Recent Labs  Lab 05/11/23 0951 05/11/23 1139 05/11/23 1619 05/12/23 0656 05/12/23 1205  GLUCAP 86 83 75 96 87   Lipid Profile: No results for input(s): "CHOL", "HDL", "LDLCALC", "TRIG", "CHOLHDL", "LDLDIRECT" in the last 72 hours. Thyroid Function Tests: No results for input(s): "TSH", "T4TOTAL", "FREET4", "T3FREE", "THYROIDAB" in the last 72 hours. Anemia Panel: No results for input(s): "VITAMINB12", "FOLATE", "FERRITIN", "TIBC", "IRON", "RETICCTPCT" in the last 72 hours. Sepsis Labs: No results for input(s): "PROCALCITON", "LATICACIDVEN" in the last 168 hours.   Recent Results (from the past 240 hour(s))  Culture, blood (Routine X 2) w Reflex to ID Panel     Status: Abnormal   Collection Time: 05/09/23  9:09 AM   Specimen: BLOOD  Result Value Ref Range Status   Specimen Description   Final    BLOOD A-LINE Performed at [facility], [address] [place], [place] [address]    Special Requests   Final    BOTTLES DRAWN AEROBIC AND ANAEROBIC AEROMONAS CAVIAE Performed at [facility], [address] [place], [place] [address]    Culture  Setup Time   Final    GRAM NEGATIVE RODS IN BOTH AEROBIC AND ANAEROBIC BOTTLES CRITICAL RESULT CALLED TO, READ BACK BY AND VERIFIED WITH: JASON ROBINS @0420  05/10/23 [facility]    Culture PSEUDOMONAS AERUGINOSA (A)  Final   Report Status 05/12/2023 FINAL  Final   Organism ID, Bacteria PSEUDOMONAS AERUGINOSA  Final      Susceptibility   Pseudomonas aeruginosa - MIC*    CEFTAZIDIME 8 SENSITIVE Sensitive     CIPROFLOXACIN 1 INTERMEDIATE Intermediate     GENTAMICIN <=1 SENSITIVE Sensitive     IMIPENEM  2 SENSITIVE Sensitive     * PSEUDOMONAS AERUGINOSA  Culture, blood (Routine X 2) w Reflex to ID Panel     Status: None   Collection Time: 05/09/23  9:09 AM   Specimen: BLOOD LEFT ARM  Result Value Ref Range Status   Specimen Description BLOOD LEFT ARM  Final   Special Requests   Final    BOTTLES DRAWN AEROBIC ONLY Blood Culture adequate volume   Culture   Final    NO GROWTH 5 DAYS Performed at [facility], [address], [place], [place] [address]    Report Status 05/14/2023 FINAL  Final  Blood Culture ID Panel (Reflexed)     Status: Abnormal   Collection Time: 05/09/23  9:09 AM  Result Value Ref Range Status   Enterococcus faecalis NOT DETECTED NOT DETECTED Final   Enterococcus Faecium NOT DETECTED NOT DETECTED Final   Listeria monocytogenes NOT DETECTED NOT DETECTED Final  Staphylococcus species NOT DETECTED NOT DETECTED Final   Staphylococcus aureus (BCID) NOT DETECTED NOT DETECTED Final   Staphylococcus epidermidis NOT DETECTED NOT DETECTED Final   Staphylococcus lugdunensis NOT DETECTED NOT DETECTED Final   Streptococcus species NOT DETECTED NOT DETECTED Final   Streptococcus agalactiae NOT DETECTED NOT DETECTED Final   Streptococcus pneumoniae NOT DETECTED NOT DETECTED Final   Streptococcus pyogenes NOT DETECTED NOT DETECTED Final   A.calcoaceticus-baumannii NOT DETECTED NOT DETECTED Final   Bacteroides fragilis NOT DETECTED NOT DETECTED Final   Enterobacterales NOT DETECTED NOT DETECTED Final   Enterobacter cloacae complex NOT DETECTED NOT DETECTED Final   Escherichia coli NOT DETECTED NOT DETECTED Final   Klebsiella aerogenes NOT DETECTED NOT DETECTED Final   Klebsiella oxytoca NOT DETECTED NOT DETECTED Final   Klebsiella pneumoniae NOT DETECTED NOT DETECTED Final   Proteus species NOT DETECTED NOT DETECTED Final   Salmonella species NOT DETECTED NOT DETECTED Final   Serratia marcescens NOT DETECTED NOT DETECTED Final   Haemophilus influenzae NOT DETECTED  NOT DETECTED Final   Neisseria meningitidis NOT DETECTED NOT DETECTED Final   Pseudomonas aeruginosa DETECTED (A) NOT DETECTED Final    Comment: CRITICAL RESULT CALLED TO, READ BACK BY AND VERIFIED WITH: JASON ROBINS @ 0420 05/10/23 [facility]    Stenotrophomonas maltophilia NOT DETECTED NOT DETECTED Final   Candida albicans NOT DETECTED NOT DETECTED Final   Candida auris NOT DETECTED NOT DETECTED Final   Candida glabrata NOT DETECTED NOT DETECTED Final   Candida krusei NOT DETECTED NOT DETECTED Final   Candida parapsilosis NOT DETECTED NOT DETECTED Final   Candida tropicalis NOT DETECTED NOT DETECTED Final   Cryptococcus neoformans/gattii NOT DETECTED NOT DETECTED Final   CTX-M ESBL NOT DETECTED NOT DETECTED Final   Carbapenem resistance IMP NOT DETECTED NOT DETECTED Final   Carbapenem resistance KPC NOT DETECTED NOT DETECTED Final   Carbapenem resistance NDM NOT DETECTED NOT DETECTED Final   Carbapenem resistance VIM NOT DETECTED NOT DETECTED Final    Comment: Performed at [facility], [address] [place], [place] [address]  Culture, blood (Routine X 2) w Reflex to ID Panel     Status: None   Collection Time: 05/11/23 11:08 AM   Specimen: BLOOD  Result Value Ref Range Status   Specimen Description [facility]LOOD LEFT [facility]  Final   Special Requests   Final    BOTTLES DRAWN AEROBIC AND ANAEROBIC Blood Culture adequate volume   Culture   Final    NO GROWTH 5 DAYS Performed at [facility], [address] [place], [place] [address]    Report Status 05/16/2023 FINAL  Final  Culture, blood (Routine X 2) w Reflex to ID Panel     Status: None   Collection Time: 05/11/23 11:08 AM   Specimen: BLOOD  Result Value Ref Range Status   Specimen Description BLOOD LEFT WRIST  Final   Special Requests   Final    BOTTLES DRAWN AEROBIC AND ANAEROBIC Blood Culture adequate volume   Culture   Final    NO GROWTH 5 DAYS Performed at [facility], [address] [place],  [place] [address]    Report Status 05/16/2023 FINAL  Final  Aerobic Culture w Gram Stain (superficial specimen)     Status: None   Collection Time: 05/11/23  2:25 PM   Specimen: Thigh; Wound  Result Value Ref Range Status   Specimen Description   Final    THIGH Performed at [facility], [address] [place], [place] [address]    Special Requests   Final    LEFT THIGH Performed at [facility], [address] [place], [place] [address]    Gram Stain   Final    NO WBC SEEN NO ORGANISMS SEEN Performed at [facility], [address], [place], [place] [address]    Culture RARE PSEUDOMONAS AERUGINOSA  Final   Report Status 05/14/2023 FINAL  Final   Organism ID, Bacteria PSEUDOMONAS AERUGINOSA  Final      Susceptibility   Pseudomonas aeruginosa - MIC*    CEFTAZIDIME 16 INTERMEDIATE Intermediate     CIPROFLOXACIN 1 INTERMEDIATE Intermediate     GENTAMICIN <=1 SENSITIVE Sensitive     IMIPENEM 2 SENSITIVE Sensitive     * RARE PSEUDOMONAS AERUGINOSA  Aerobic Culture w Gram Stain (superficial specimen)     Status: None   Collection Time: 05/11/23  2:30 PM   Specimen: Back; Wound  Result Value Ref Range Status   Specimen Description   Final    BACK Performed at [facility], [address], [place], [place] [address]    Special Requests   Final    BACK Performed at [facility], [address] [place], [place] [address]    Gram Stain   Final    NO WBC SEEN NO ORGANISMS SEEN Performed at [facility], [address] [place], [place] [address]    Culture   Final    FEW PSEUDOMONAS AERUGINOSA FEW ACINETOBACTER BAUMANNII    Report Status 05/14/2023 FINAL  Final   Organism ID, Bacteria PSEUDOMONAS AERUGINOSA  Final   Organism ID, Bacteria ACINETOBACTER BAUMANNII  Final      Susceptibility   Acinetobacter baumannii - MIC*    CEFTAZIDIME >=64 RESISTANT Resistant     CIPROFLOXACIN >=4 RESISTANT Resistant     GENTAMICIN >=16  RESISTANT Resistant     IMIPENEM 1 SENSITIVE Sensitive     PIP/TAZO >=128 RESISTANT Resistant     TRIMETH/SULFA >=320 RESISTANT Resistant     AMPICILLIN/SULBACTAM 4 SENSITIVE Sensitive     * FEW ACINETOBACTER BAUMANNII   Pseudomonas aeruginosa - MIC*    CEFTAZIDIME 16 INTERMEDIATE Intermediate     CIPROFLOXACIN 1 INTERMEDIATE Intermediate     GENTAMICIN 2 SENSITIVE Sensitive     IMIPENEM 2 SENSITIVE Sensitive     * FEW PSEUDOMONAS AERUGINOSA         Radiology Studies: DG Hand 2 View Right  Result Date: 05/15/2023 CLINICAL DATA:  Pain and swelling of the bilateral hands. EXAM: RIGHT HAND - 2 VIEW COMPARISON:  None Available. FINDINGS: There is mild-to-moderate medial downsloping of the distal radius, symmetric with the contralateral hand imaged the same day and likely congenital. 5 mm ulnar negative variance. There is possible subluxation of the base of the thumb metacarpal with respect of the trapezium given increased lateralization and angulation on the provided oblique view, however the joint appears normally aligned on frontal view. Note is made there is a greater appearance of possible subluxation versus dislocation of the contralateral LEFT thumb carpometacarpal joint imaged the same day. No acute fracture. IMPRESSION: 1. Mild-to-moderate medial downsloping of the distal radius, symmetric with the contralateral hand imaged the same day and likely congenital. 2. There is 5 mm ulnar negative variance. 3. On oblique view there appears to be exaggerated angulation and lateralization of the thumb metacarpal with respect to the trapezium, however the joint appears normal on frontal view. No definite subluxation. Note  is made there are findings more suggestive of at least subluxation of the contralateral LEFT thumb carpometacarpal joint on same-day radiographs. Electronically Signed   By: Neita Garnet M.D.   On: 05/15/2023 11:08   DG Hand 2 View Left  Result Date: 05/15/2023 CLINICAL DATA:   Bilateral hand pain and swelling. EXAM: LEFT HAND - 2 VIEW COMPARISON:  None Available. FINDINGS: There is decreased bone mineralization, greatest around the joint spaces. There is mild-to-moderate medial downsloping of the distal radius, symmetric with the contralateral side imaged the same day and likely congenital. 5 mm ulnar negative variance. Mild distal radioulnar joint space narrowing and peripheral osteophytosis. There is more lateral and, dorsal, and proximal positioning of the base of the thumb metacarpal than expected compared to the trapezium, and there appears to be a high-grade subluxation versus dislocation of the thumb carpometacarpal joint, best seen on lateral view. There is similar but less severe angulation of the right thumb carpometacarpal joint, imaged the same day. Mild-to-moderate triscaphe and second carpometacarpal joint space narrowing and subchondral cystic change. Mild interphalangeal joint space narrowing diffusely. No acute fracture is seen. IMPRESSION: 1. There appears to be a high-grade subluxation versus dislocation of the thumb carpometacarpal joint, best seen on lateral view. This is similar to but more severe than angulation of the contralateral right thumb carpometacarpal joint on same-day radiographs, and therefore may represent some congenital angulation of this joint. However, recommend clinical correlation for possible dislocation. 2. Mild-to-moderate triscaphe and second carpometacarpal joint space narrowing and subchondral cystic change. Electronically Signed   By: Neita Garnet M.D.   On: 05/15/2023 11:05        Scheduled Meds:  vitamin C  500 mg Oral BID   Chlorhexidine Gluconate Cloth  6 each Topical Daily   copper  8 mg Oral Daily   feeding supplement  1 Container Oral TID BM   folic acid  1 mg Oral Daily   hydrocerin   Topical BID   lactase  6,000 Units Oral TID WC   levothyroxine  100 mcg Oral Q0600   lipase/protease/amylase  24,000 Units Oral TID AC    metoprolol succinate  12.5 mg Oral Daily   mirtazapine  15 mg Oral QHS   multivitamin with minerals  1 tablet Oral BID   pantoprazole  40 mg Oral QHS   Rivaroxaban  15 mg Oral BID WC   Followed by   Melene Muller ON 05/30/2023] rivaroxaban  20 mg Oral Q supper   saccharomyces boulardii  250 mg Oral BID   sodium bicarbonate  650 mg Oral BID   thiamine  100 mg Oral Daily   vitamin A  10,000 Units Oral Daily   Vitamin D (Ergocalciferol)  50,000 Units Oral Q7 days   Vitamin E  400 Units Oral Daily   zinc sulfate  220 mg Oral Daily   Continuous Infusions:  meropenem (MERREM) IV 1 g (05/17/23 0526)     LOS: 26 days     Tresa Moore, MD [facility]   If 7PM-7AM, please contact night-coverage  05/17/2023, 10:12 AM

## 2023-05-18 DIAGNOSIS — E039 Hypothyroidism, unspecified: Secondary | ICD-10-CM | POA: Diagnosis not present

## 2023-05-18 LAB — CBC WITH DIFFERENTIAL/PLATELET
Abs Immature Granulocytes: 0.14 10*3/uL — ABNORMAL HIGH (ref 0.00–0.07)
Basophils Absolute: 0 10*3/uL (ref 0.0–0.1)
Basophils Relative: 0 %
Eosinophils Absolute: 0.6 10*3/uL — ABNORMAL HIGH (ref 0.0–0.5)
Eosinophils Relative: 9 %
HCT: 27.8 % — ABNORMAL LOW (ref 39.0–52.0)
Hemoglobin: 8.9 g/dL — ABNORMAL LOW (ref 13.0–17.0)
Immature Granulocytes: 2 %
Lymphocytes Relative: 26 %
Lymphs Abs: 1.9 10*3/uL (ref 0.7–4.0)
MCH: 28.2 pg (ref 26.0–34.0)
MCHC: 32 g/dL (ref 30.0–36.0)
MCV: 88 fL (ref 80.0–100.0)
Monocytes Absolute: 0.7 10*3/uL (ref 0.1–1.0)
Monocytes Relative: 10 %
Neutro Abs: 3.9 10*3/uL (ref 1.7–7.7)
Neutrophils Relative %: 53 %
Platelets: 526 10*3/uL — ABNORMAL HIGH (ref 150–400)
RBC: 3.16 MIL/uL — ABNORMAL LOW (ref 4.22–5.81)
RDW: 18.9 % — ABNORMAL HIGH (ref 11.5–15.5)
WBC: 7.3 10*3/uL (ref 4.0–10.5)
nRBC: 0 % (ref 0.0–0.2)

## 2023-05-18 LAB — RENAL FUNCTION PANEL
Albumin: 2.1 g/dL — ABNORMAL LOW (ref 3.5–5.0)
Anion gap: 10 (ref 5–15)
BUN: 54 mg/dL — ABNORMAL HIGH (ref 6–20)
CO2: 21 mmol/L — ABNORMAL LOW (ref 22–32)
Calcium: 8.4 mg/dL — ABNORMAL LOW (ref 8.9–10.3)
Chloride: 105 mmol/L (ref 98–111)
Creatinine, Ser: 2.13 mg/dL — ABNORMAL HIGH (ref 0.61–1.24)
GFR, Estimated: 38 mL/min — ABNORMAL LOW (ref >60)
Glucose, Bld: 79 mg/dL (ref 70–99)
Phosphorus: 3.4 mg/dL (ref 2.5–4.6)
Potassium: 4.1 mmol/L (ref 3.5–5.1)
Sodium: 136 mmol/L (ref 135–145)

## 2023-05-18 LAB — MAGNESIUM: Magnesium: 1.8 mg/dL (ref 1.7–2.4)

## 2023-05-18 NOTE — Progress Notes (Signed)
Occupational Therapy Treatment Patient Details Name: Christopher Herman MRN: [id number] DOB: 1976/05/26 Today's Date: 05/18/2023   History of present illness 47 y/o male presented to ED on 04/21/23 for weakness, extreme fatigue, unintended weight loss, disuse of UE and poor P.O. intake. Admitted for sepsis 2/2 unknown etiology and severe hypothyroidism. PMH: severe obesity s/p bariatric surgery, OSA, hypoventilation syndrome, HTN, cholelithiasis, bilateral extremity lipoma, chronic lymphedema s/p IVC filter placement, IDA   OT comments  Patient received supine in bed and agreeable to OT. Assistance provided for sitting pt up in bed. Pt then engaged in BUE AROM exercises for improved ROM/strength using yellow theraband (see details below). Pt required rest breaks in between sets of exercises this date. He then engaged in bed level grooming tasks with varying levels of assistance required 2/2 hand weakness and decreased FM coordination. Pt left as received with all needs in reach. Pt is making progress toward goal completion. D/C recommendation remains appropriate. OT will continue to follow acutely.    Recommendations for follow up therapy are one component of a multi-disciplinary discharge planning process, led by the attending physician.  Recommendations may be updated based on patient status, additional functional criteria and insurance authorization.    Assistance Recommended at Discharge Frequent or constant Supervision/Assistance  Patient can return home with the following  Two people to help with bathing/dressing/bathroom;Two people to help with walking and/or transfers;Assistance with cooking/housework;Assist for transportation;Help with stairs or ramp for entrance   Equipment Recommendations  None recommended by OT    Recommendations for Other Services      Precautions / Restrictions Precautions Precautions: Fall Restrictions Weight Bearing Restrictions: No       Mobility Bed  Mobility      Transfers         Balance         ADL either performed or assessed with clinical judgement   ADL Overall ADL's : Needs assistance/impaired     Grooming: Oral care;Wash/dry face;Bed level Grooming Details (indicate cue type and reason): Warm washcloth placed on pt's R hand, able to wash all sides of face with set up A. With OT holding toothbrush, pt attempted to squeeze small toothpaste tube between thumb and index finger on R hand. Unable to complete this date despite increased time and VC provided, Max A provided by OT to place toothpaste on toothbrush. Pt used R hand to brush teeth for ~1 min. OT placed basin under pt's chin for spitting; pt rinsed mouth with OT providing Min A to keep cup of water from spilling out of pt's R hand. Pt able to use R hand to wipe water from beard with washcloth.        Extremity/Trunk Assessment Upper Extremity Assessment Upper Extremity Assessment: Generalized weakness   Lower Extremity Assessment Lower Extremity Assessment: Generalized weakness        Vision Patient Visual Report: No change from baseline     Perception     Praxis      Cognition Arousal/Alertness: Awake/alert Behavior During Therapy: WFL for tasks assessed/performed Overall Cognitive Status: Within Functional Limits for tasks assessed          Exercises General Exercises - Upper Extremity Shoulder Flexion: AROM, Strengthening, Both, 10 reps, Theraband Theraband Level (Shoulder Flexion): Level 1 (Yellow) Shoulder Horizontal ADduction: AROM, Strengthening, Both, 10 reps, Theraband Theraband Level (Shoulder Horizontal Adduction): Level 1 (Yellow) Elbow Flexion: AROM, Strengthening, Both, 10 reps, Theraband Theraband Level (Elbow Flexion): Level 1 (Yellow) Elbow Extension: AROM, Strengthening, Both, 10  reps, Theraband Theraband Level (Elbow Extension): Level 1 (Yellow)    Shoulder Instructions       General Comments      Pertinent Vitals/  Pain       Pain Assessment Pain Assessment: No/denies pain  Home Living          Prior Functioning/Environment              Frequency  Min 1X/week        Progress Toward Goals  OT Goals(current goals can now be found in the care plan section)  Progress towards OT goals: Progressing toward goals  Acute Rehab OT Goals Patient Stated Goal: get better OT Goal Formulation: With patient Time For Goal Achievement: 05/27/23 Potential to Achieve Goals: Fair  Plan Discharge plan remains appropriate;Frequency remains appropriate    Co-evaluation                 AM-PAC OT "6 Clicks" Daily Activity     Outcome Measure   Help from another person eating meals?: A Lot Help from another person taking care of personal grooming?: A Lot Help from another person toileting, which includes using toliet, bedpan, or urinal?: Total Help from another person bathing (including washing, rinsing, drying)?: Total Help from another person to put on and taking off regular upper body clothing?: A Lot Help from another person to put on and taking off regular lower body clothing?: Total 6 Click Score: 9    End of Session    OT Visit Diagnosis: Muscle weakness (generalized) (M62.81)   Activity Tolerance Patient tolerated treatment well   Patient Left in bed;with call bell/phone within reach   Nurse Communication Mobility status        Time: 1610-9604 OT Time Calculation (min): 23 min  Charges: OT General Charges $OT Visit: 1 Visit OT Treatments $Self Care/Home Management : 8-22 mins $Therapeutic Exercise: 8-22 mins  [facility] MS, OTR/L ascom [phone]  05/18/23, 2:10 PM

## 2023-05-18 NOTE — Progress Notes (Signed)
Patient fed food and drink per request along with getting ice water refilled per request. Patient repositioned for comfort and linens changed. All dressings still dry and intact. Turned down heat again per patient's request. Covered patient with sheet and both call bells along with side table within patient's reach.

## 2023-05-18 NOTE — TOC Progression Note (Signed)
Transition of Care [facility]) - Progression Note    Patient Details  Name: SKYLAR SIEN MRN: [id number] Date of Birth: [birth year]  Transition of Care [facility] CM/SW Contact  [name], RN Phone Number: 05/18/2023, 3:35 PM  Clinical Narrative:    Bed offers received from [facility], and [facility] Bed offers presented. Patient declines both bed offers Patient states "ill just get my antibiotics till Saturday and go home" Patient requesting home health services I notified patient that his insurance and level of care would be a barrier to obtaining home health services, and that he would likely have a copay.  Patient expresses understanding  - [facility] - unable to accept - [facility] - Elnita Maxwell - unable to accept - [facility] [place] - Unable to accept - [facility] - Amy- awaiting response - [facility] Adelina Mings - unable to accept - [facility] - Barbara Cower - unable to accept - [facility] Spence - unable to accept  - [facility] - Maralyn Sago - unable to accept    Patient states that his mother will be able to care for him and provide daily wound care.  Patient has been bedbound while in hospital.  Patient states that he previously had a hospital bed but returned it bc "it didn't work for me"  Patient request a WC and hoyer lift  Patient gave permissions for TOC to call mother Call placed to mother [name] x2.  Unable to leave a message Will wait to order DME till I speak with mother and confirm patient can return    Expected Discharge Plan: Skilled Nursing Facility Barriers to Discharge: Continued Medical Work up  Expected Discharge Plan and Services     Post Acute Care Choice: Skilled Nursing Facility Living arrangements for the past 2 months: Single Family Home                                       Social Determinants of Health (SDOH) Interventions SDOH Screenings   Food Insecurity: No Food Insecurity (11/04/2022)  Housing: Low Risk  (11/04/2022)  Transportation Needs: No  Transportation Needs (11/04/2022)  Alcohol Screen: Low Risk  (09/13/2018)  Depression (PHQ2-9): Low Risk  (11/04/2022)  Physical Activity: Inactive (11/04/2022)  Tobacco Use: Medium Risk (04/21/2023)    Readmission Risk Interventions     No data to display

## 2023-05-18 NOTE — Progress Notes (Signed)
PROGRESS NOTE    Christopher Herman  ZOX:[id number] DOB: [birth year] DOA: 04/21/2023 PCP: Pcp, No    Brief Narrative:  Patient is a 47 y.o. male with a PMH significant for severe obesity s/p bariatric surgery, OSA, hypoventilation syndrome, HTN, GERD, cholelithiasis, bilateral extremity, chronic lymphedema ,status post IVC filter placement, IDA who presented from home to the ED on 04/21/2023 with generalized weakness, extreme fatigue, unintended weight loss, disuse of upper extremities and poor p.o. intake.Marland Kitchen He is chronically bed bound and malnourished.  Initially septic shock was suspected, admitted to ICU, started on broad-spectrum antibiotics.  Also required vasopressors in the interim but have not been discontinued. Hospital course remarkable for worsening kidney function.  Nephrology following, suspected to have ATN given septic shock.  PT/OT recommending SNF/long-term care.  TOC following.  Palliative care was also consulted for goals of care.  Remains full code.   5/8: DVT study positive 5/11: Febrile, repeat cultures sent, intermittent diarrhea noted.  Resume vancomycin pending cultures.  Initiate Xarelto 5/12: Blood culture with Pseudomonas-vancomycin/ceftriaxone changed to cefepime.  Hemoglobin decreased to 6.5, without overt bleeding. 1 unit of PRBC.   5/13-14: ID consulted for bacteremia with Pseudomonas aeruginosa.  5/15: Remains on meropenem for Pseudomonas bacteremia 5/16: Unfortunately admissions coordinator at [facility] notified us that the bed offer was rescinded.  Multiple reasons given 5/17: Notified by TOC that [facility] is also declined placement.  Patient was reluctant to leave the area 5/20: Patient did have bed offers in [place] in [place].  He states these are too far from his house.  Wishes to go home at time of discharge.   Assessment & Plan:   Principal Problem:   Severe hypothyroidism Active Problems:   Malnutrition of moderate degree (HCC)    Generalized weakness   Ulcer of right ankle (HCC)   Hypotension due to hypovolemia   Acute renal failure due to tubular necrosis (HCC)   Hypothyroidism   Multiple wounds   Bacteremia due to Pseudomonas   Lymphedema associated with obesity  Septic shock secondary to staph bacteremia with repeat culture showing Pseudomonas, POA:  Shock physiology has long resolved.  Patient was previously on midodrine and stress dose steroids for blood pressure support.  These have been weaned off.  Initial blood cultures positive for Staphylococcus capitis.  Subsequently patient developed recurrent fevers.  Repeat blood culture positive for Pseudomonas.  Source appears to be multiple skin infections.  Cultured by ID. Plan: Continue meropenem with pharmacy dosing assistance.  Infectious disease following.  Monitor vitals and fever curve.  Antibiotic end date 5/25.  Will plan to keep patient hospitalized until 5/25 and discharged home after  Left lower extremity DVT.   -Noted on ultrasound 5/8  -Started Xarelto 5/11  -No respiratory symptoms, no indication for VQ scan, given patient's bedbound status he remains remarkably high risk for recurrent DVT.   AKI:  Likely ATN in the setting of septic shock, improved Bicarb drip discontinued 5/17 P.o. bicarb initiated Ultrasound unremarkable, urine output remains appropriate Nephrology consulted, no indication for dialysis Kidney function improving Renal indices continue to improve as of 5/20   Chronic combined systolic /diastolic CHF:  Echo done on 4/24 showed EF of 35 to 40%, global hypokinesis, grade 1 diastolic dysfunction.   Appears euvolemic   Hypokalemia: Improved.  Continue to monitor and replete as needed.   Severe hypothyroidism:  Completed IV Synthroid dosing, continue p.o. Synthroid.   Recommend follow-up thyroid function test in 4 to 6 weeks.  Leukocytosis/thrombocytosis: Resolved, questionably reactive given above.   Anemia of chronic  disease:  Status post 3 unit PRBC this hospitalization Low threshold for imaging given newly prescribed Xarelto   Chronic sinus tachycardia:  Chronic problem.  QTc also prolonged.  Avoid QTc prolonging medications.  On low-dose metoprolol   Depression: Continue Remeron   Chronic abdomen pain/diarrhea :He complains of chronic abdominal pain and fear of eating as it precipitates diarrhea.  Continue Creon - appetite/diarrhea and bloating sensations appear to be improving.  Patient tolerating p.o. intake   Morbid obesity/bedbound status: PT/OT recommending.  Long-term facility.  TOC following.  Unfortunately bed offer at [facility] was rescinded for reasons that are unclear.  They have cited no capacity however they have previously offered a bed so this response is dubious at best.  [facility].  Bed search has been expanded.  If no acceptable options can be found we may need to discharge patient home   Goals of care: Morbidly obese patient with multiple medical problems.  History of bariatric surgery, OSA, obesity hypoventilation syndrome, chronic lymphedema, status post IVC filter placement. bedbound status.  Palliative care was following for goals of care, remains full code.  Will need long-term care facility.  May be appropriate candidate for long-term acute care hospital  DVT prophylaxis: Xarelto Code Status: Full Family Communication: None Disposition Plan: Status is: Inpatient Remains inpatient appropriate because: Pseudomonas bacteremia on IV antibiotics   Level of care: Med-Surg  Consultants:  Nephrology ID  Procedures:  None  Antimicrobials: Meropenem   Subjective: Seen and examined.  No complaints this morning.  Confirms that he does not want to go to skilled nursing facility.  Too far from his house.  Objective: Vitals:   05/17/23 2117 05/17/23 2128 05/18/23 0542 05/18/23 0727  BP: 100/79 103/70 105/80  112/81  Pulse: (!) 112 100 96 94  Resp: 16 16 16 16   Temp: 98.2 F (36.8 C) 98.2 F (36.8 C) 98.1 F (36.7 C) 97.9 F (36.6 C)  TempSrc: Oral   Oral  SpO2: 100% 100% 100% 100%  Weight:   (!) 175.6 kg   Height:        Intake/Output Summary (Last 24 hours) at 05/18/2023 1307 Last data filed at 05/18/2023 0954 Gross per 24 hour  Intake 480 ml  Output --  Net 480 ml   Filed Weights   05/14/23 0437 05/15/23 0458 05/18/23 0542  Weight: (!) 175.2 kg 78.7 kg (!) 175.6 kg    Examination:  General exam: NAD.  Appears chronically ill Respiratory system: Poor respiratory effort.  Normal work of breathing.  Room air Cardiovascular system: S1-S2, RRR, no murmurs, marked lymphedema bilateral lower extremities Gastrointestinal system: Obese, soft, NT/ND,+ bowel sounds Central nervous system: Alert and oriented. No focal neurological deficits. Extremities: Markedly decreased power bilateral upper and lower extremities Skin: Multiple areas of skin lesions and breakdown in various levels of healing Psychiatry: Judgement and insight appear normal. Mood & affect appropriate.     Data Reviewed: I have personally reviewed following labs and imaging studies  CBC: Recent Labs  Lab 05/12/23 0755 05/13/23 0859 05/14/23 0600 05/18/23 0704  WBC 10.4 10.2 10.7* 7.3  NEUTROABS 9.0* 8.1* 8.1* 3.9  HGB 8.4* 8.4* 8.1* 8.9*  HCT 25.6* 25.0* 24.8* 27.8*  MCV 85.9 85.0 87.0 88.0  PLT 223 251 276 526*   Basic Metabolic Panel: Recent Labs  Lab 05/12/23 0755 05/13/23 0859 05/14/23 0600 05/18/23 0704  NA  --  136 135 136  K  --  3.2* 3.7 4.1  CL  --  107 107 105  CO2  --  20* 18* 21*  GLUCOSE  --  84 87 79  BUN  --  60* 59* 54*  CREATININE  --  2.70* 2.37* 2.13*  CALCIUM  --  8.0* 8.0* 8.4*  MG  --   --   --  1.8  PHOS 3.1 3.0 2.7 3.4   GFR: Estimated Creatinine Clearance: 66.3 mL/min (A) (by C-G formula based on SCr of 2.13 mg/dL (H)). Liver Function Tests: Recent Labs  Lab  05/14/23 0600 05/18/23 0704  AST 15  --   ALT 14  --   ALKPHOS 54  --   BILITOT 0.6  --   PROT 4.7*  --   ALBUMIN 2.3* 2.1*   No results for input(s): "LIPASE", "AMYLASE" in the last 168 hours. No results for input(s): "AMMONIA" in the last 168 hours. Coagulation Profile: No results for input(s): "INR", "PROTIME" in the last 168 hours. Cardiac Enzymes: No results for input(s): "CKTOTAL", "CKMB", "CKMBINDEX", "TROPONINI" in the last 168 hours. BNP (last 3 results) No results for input(s): "PROBNP" in the last 8760 hours. HbA1C: No results for input(s): "HGBA1C" in the last 72 hours. CBG: Recent Labs  Lab 05/11/23 1619 05/12/23 0656 05/12/23 1205  GLUCAP 75 96 87   Lipid Profile: No results for input(s): "CHOL", "HDL", "LDLCALC", "TRIG", "CHOLHDL", "LDLDIRECT" in the last 72 hours. Thyroid Function Tests: No results for input(s): "TSH", "T4TOTAL", "FREET4", "T3FREE", "THYROIDAB" in the last 72 hours. Anemia Panel: No results for input(s): "VITAMINB12", "FOLATE", "FERRITIN", "TIBC", "IRON", "RETICCTPCT" in the last 72 hours. Sepsis Labs: No results for input(s): "PROCALCITON", "LATICACIDVEN" in the last 168 hours.   Recent Results (from the past 240 hour(s))  Culture, blood (Routine X 2) w Reflex to ID Panel     Status: Abnormal   Collection Time: 05/09/23  9:09 AM   Specimen: BLOOD  Result Value Ref Range Status   Specimen Description   Final    BLOOD A-LINE Performed at [facility], [address], [place], [place] [address]    Special Requests   Final    BOTTLES DRAWN AEROBIC AND ANAEROBIC AEROMONAS CAVIAE Performed at [facility], [address] [place], [place] [address]    Culture  Setup Time   Final    GRAM NEGATIVE RODS IN BOTH AEROBIC AND ANAEROBIC BOTTLES CRITICAL RESULT CALLED TO, READ BACK BY AND VERIFIED WITH: JASON ROBINS @0420  05/10/23 [facility]    Culture PSEUDOMONAS AERUGINOSA (A)  Final   Report Status 05/12/2023 FINAL  Final    Organism ID, Bacteria PSEUDOMONAS AERUGINOSA  Final      Susceptibility   Pseudomonas aeruginosa - MIC*    CEFTAZIDIME 8 SENSITIVE Sensitive     CIPROFLOXACIN 1 INTERMEDIATE Intermediate     GENTAMICIN <=1 SENSITIVE Sensitive     IMIPENEM 2 SENSITIVE Sensitive     * PSEUDOMONAS AERUGINOSA  Culture, blood (Routine X 2) w Reflex to ID Panel     Status: None   Collection Time: 05/09/23  9:09 AM   Specimen: BLOOD LEFT ARM  Result Value Ref Range Status   Specimen Description BLOOD LEFT ARM  Final   Special Requests   Final    BOTTLES DRAWN AEROBIC ONLY Blood Culture adequate volume   Culture   Final    NO GROWTH 5 DAYS Performed at [facility], [address] [place], [place] [address]    Report Status 05/14/2023 FINAL  Final  Blood Culture ID Panel (Reflexed)     Status: Abnormal   Collection Time: 05/09/23  9:09 AM  Result Value Ref Range Status   Enterococcus faecalis NOT DETECTED NOT DETECTED Final   Enterococcus Faecium NOT DETECTED NOT DETECTED Final   Listeria monocytogenes NOT DETECTED NOT DETECTED Final   Staphylococcus species NOT DETECTED NOT DETECTED Final   Staphylococcus aureus (BCID) NOT DETECTED NOT DETECTED Final   Staphylococcus epidermidis NOT DETECTED NOT DETECTED Final   Staphylococcus lugdunensis NOT DETECTED NOT DETECTED Final   Streptococcus species NOT DETECTED NOT DETECTED Final   Streptococcus agalactiae NOT DETECTED NOT DETECTED Final   Streptococcus pneumoniae NOT DETECTED NOT DETECTED Final   Streptococcus pyogenes NOT DETECTED NOT DETECTED Final   A.calcoaceticus-baumannii NOT DETECTED NOT DETECTED Final   Bacteroides fragilis NOT DETECTED NOT DETECTED Final   Enterobacterales NOT DETECTED NOT DETECTED Final   Enterobacter cloacae complex NOT DETECTED NOT DETECTED Final   Escherichia coli NOT DETECTED NOT DETECTED Final   Klebsiella aerogenes NOT DETECTED NOT DETECTED Final   Klebsiella oxytoca NOT DETECTED NOT DETECTED Final    Klebsiella pneumoniae NOT DETECTED NOT DETECTED Final   Proteus species NOT DETECTED NOT DETECTED Final   Salmonella species NOT DETECTED NOT DETECTED Final   Serratia marcescens NOT DETECTED NOT DETECTED Final   Haemophilus influenzae NOT DETECTED NOT DETECTED Final   Neisseria meningitidis NOT DETECTED NOT DETECTED Final   Pseudomonas aeruginosa DETECTED (A) NOT DETECTED Final    Comment: CRITICAL RESULT CALLED TO, READ BACK BY AND VERIFIED WITH: JASON ROBINS @ 0420 05/10/23 [facility]    Stenotrophomonas maltophilia NOT DETECTED NOT DETECTED Final   Candida albicans NOT DETECTED NOT DETECTED Final   Candida auris NOT DETECTED NOT DETECTED Final   Candida glabrata NOT DETECTED NOT DETECTED Final   Candida krusei NOT DETECTED NOT DETECTED Final   Candida parapsilosis NOT DETECTED NOT DETECTED Final   Candida tropicalis NOT DETECTED NOT DETECTED Final   Cryptococcus neoformans/gattii NOT DETECTED NOT DETECTED Final   CTX-M ESBL NOT DETECTED NOT DETECTED Final   Carbapenem resistance IMP NOT DETECTED NOT DETECTED Final   Carbapenem resistance KPC NOT DETECTED NOT DETECTED Final   Carbapenem resistance NDM NOT DETECTED NOT DETECTED Final   Carbapenem resistance VIM NOT DETECTED NOT DETECTED Final    Comment: Performed at [facility], [address] [place], [place] [address]  Culture, blood (Routine X 2) w Reflex to ID Panel     Status: None   Collection Time: 05/11/23 11:08 AM   Specimen: BLOOD  Result Value Ref Range Status   Specimen Description BLOOD LEFT [facility]  Final   Special Requests   Final    BOTTLES DRAWN AEROBIC AND ANAEROBIC Blood Culture adequate volume   Culture   Final    NO GROWTH 5 DAYS Performed at [facility], [address] [place], [place] [address]    Report Status 05/16/2023 FINAL  Final  Culture, blood (Routine X 2) w Reflex to ID Panel     Status: None   Collection Time: 05/11/23 11:08 AM   Specimen: BLOOD  Result Value Ref Range Status    Specimen Description BLOOD LEFT WRIST  Final   Special Requests   Final    BOTTLES DRAWN AEROBIC AND ANAEROBIC Blood Culture adequate volume   Culture   Final    NO GROWTH 5 DAYS Performed at [facility], [address] [place], [place] [address]    Report Status 05/16/2023 FINAL  Final  Aerobic Culture w Gram Stain (superficial specimen)     Status: None   Collection Time: 05/11/23  2:25 PM   Specimen: Thigh; Wound  Result Value Ref Range Status   Specimen Description   Final    THIGH Performed at [facility], [address], [place], [place] [address]    Special Requests   Final    LEFT THIGH Performed at [facility], [address] [place], [place] [address]    Gram Stain   Final    NO WBC SEEN NO ORGANISMS SEEN Performed at [facility], [address], [place], [place] [address]    Culture RARE PSEUDOMONAS AERUGINOSA  Final   Report Status 05/14/2023 FINAL  Final   Organism ID, Bacteria PSEUDOMONAS AERUGINOSA  Final      Susceptibility   Pseudomonas aeruginosa - MIC*    CEFTAZIDIME 16 INTERMEDIATE Intermediate     CIPROFLOXACIN 1 INTERMEDIATE Intermediate     GENTAMICIN <=1 SENSITIVE Sensitive     IMIPENEM 2 SENSITIVE Sensitive     * RARE PSEUDOMONAS AERUGINOSA  Aerobic Culture w Gram Stain (superficial specimen)     Status: None   Collection Time: 05/11/23  2:30 PM   Specimen: Back; Wound  Result Value Ref Range Status   Specimen Description   Final    BACK Performed at [facility], [address] [place], [place] [address]    Special Requests   Final    BACK Performed at [facility], [address] [place], [place] [address]    Gram Stain   Final    NO WBC SEEN NO ORGANISMS SEEN Performed at [facility], [address] [place], [place] [address]    Culture   Final    FEW PSEUDOMONAS AERUGINOSA FEW ACINETOBACTER BAUMANNII    Report Status 05/14/2023 FINAL  Final   Organism ID,  Bacteria PSEUDOMONAS AERUGINOSA  Final   Organism ID, Bacteria ACINETOBACTER BAUMANNII  Final      Susceptibility   Acinetobacter baumannii - MIC*    CEFTAZIDIME >=64 RESISTANT Resistant     CIPROFLOXACIN >=4 RESISTANT Resistant     GENTAMICIN >=16 RESISTANT Resistant     IMIPENEM 1 SENSITIVE Sensitive     PIP/TAZO >=128 RESISTANT Resistant     TRIMETH/SULFA >=320 RESISTANT Resistant     AMPICILLIN/SULBACTAM 4 SENSITIVE Sensitive     * FEW ACINETOBACTER BAUMANNII   Pseudomonas aeruginosa - MIC*    CEFTAZIDIME 16 INTERMEDIATE Intermediate     CIPROFLOXACIN 1 INTERMEDIATE Intermediate     GENTAMICIN 2 SENSITIVE Sensitive     IMIPENEM 2 SENSITIVE Sensitive     * FEW PSEUDOMONAS AERUGINOSA         Radiology Studies: No results found.      Scheduled Meds:  vitamin C  500 mg Oral BID   copper  8 mg Oral Daily   feeding supplement  1 Container Oral TID BM   folic acid  1 mg Oral Daily   hydrocerin   Topical BID   lactase  6,000 Units Oral TID WC   levothyroxine  100 mcg Oral Q0600   metoprolol succinate  12.5 mg Oral Daily   mirtazapine  15 mg Oral QHS   multivitamin with minerals  1 tablet Oral BID   pantoprazole  40 mg Oral QHS   Rivaroxaban  15 mg  Oral BID WC   Followed by   Melene Muller ON 05/30/2023] rivaroxaban  20 mg Oral Q supper   saccharomyces boulardii  250 mg Oral BID   sodium bicarbonate  650 mg Oral BID   thiamine  100 mg Oral Daily   vitamin A  10,000 Units Oral Daily   Vitamin D (Ergocalciferol)  50,000 Units Oral Q7 days   Vitamin E  400 Units Oral Daily   zinc sulfate  220 mg Oral Daily   Continuous Infusions:  meropenem (MERREM) IV 1 g (05/18/23 0519)     LOS: 27 days     Tresa Moore, MD [facility]   If 7PM-7AM, please contact night-coverage  05/18/2023, 1:07 PM

## 2023-05-19 DIAGNOSIS — E039 Hypothyroidism, unspecified: Secondary | ICD-10-CM | POA: Diagnosis not present

## 2023-05-19 LAB — CREATININE, SERUM
Creatinine, Ser: 2.12 mg/dL — ABNORMAL HIGH (ref 0.61–1.24)
GFR, Estimated: 38 mL/min — ABNORMAL LOW (ref >60)

## 2023-05-19 MED ORDER — PANCRELIPASE (LIP-PROT-AMYL) 12000-38000 UNITS PO CPEP
24000.0000 [IU] | ORAL_CAPSULE | Freq: Three times a day (TID) | ORAL | Status: DC
  Administered 2023-05-19 – 2023-05-23 (×12): 24000 [IU] via ORAL
  Filled 2023-05-19 (×11): qty 2

## 2023-05-19 NOTE — Progress Notes (Signed)
PROGRESS NOTE    Christopher Herman  HQI:[id number] DOB: [birth year] DOA: 04/21/2023 PCP: Pcp, No    Brief Narrative:  Patient is a 47 y.o. male with a PMH significant for severe obesity s/p bariatric surgery, OSA, hypoventilation syndrome, HTN, GERD, cholelithiasis, bilateral extremity, chronic lymphedema ,status post IVC filter placement, IDA who presented from home to the ED on 04/21/2023 with generalized weakness, extreme fatigue, unintended weight loss, disuse of upper extremities and poor p.o. intake.Marland Kitchen He is chronically bed bound and malnourished.  Initially septic shock was suspected, admitted to ICU, started on broad-spectrum antibiotics.  Also required vasopressors in the interim but have not been discontinued. Hospital course remarkable for worsening kidney function.  Nephrology following, suspected to have ATN given septic shock.  PT/OT recommending SNF/long-term care.  TOC following.  Palliative care was also consulted for goals of care.  Remains full code.   5/8: DVT study positive 5/11: Febrile, repeat cultures sent, intermittent diarrhea noted.  Resume vancomycin pending cultures.  Initiate Xarelto 5/12: Blood culture with Pseudomonas-vancomycin/ceftriaxone changed to cefepime.  Hemoglobin decreased to 6.5, without overt bleeding. 1 unit of PRBC.   5/13-14: ID consulted for bacteremia with Pseudomonas aeruginosa.  5/15: Remains on meropenem for Pseudomonas bacteremia 5/16: Unfortunately admissions coordinator at [facility] health care notified us that the bed offer was rescinded.  Multiple reasons given 5/17: Notified by TOC that select LTAC is also declined placement.  Patient was reluctant to leave the area 5/20: Patient did have bed offers in [place] in [place].  He states these are too far from his house.  Wishes to go home at time of discharge. 5/21: ALL home health agencies have declined.  Patient will remain in the hospital through 5/25 to complete his antibiotic course, then  discharge home in the care of his mother.   Assessment & Plan:   Principal Problem:   Severe hypothyroidism Active Problems:   Malnutrition of moderate degree (HCC)   Generalized weakness   Ulcer of right ankle (HCC)   Hypotension due to hypovolemia   Acute renal failure due to tubular necrosis (HCC)   Hypothyroidism   Multiple wounds   Bacteremia due to Pseudomonas   Lymphedema associated with obesity  Septic shock secondary to staph bacteremia with repeat culture showing Pseudomonas, POA:  Shock physiology has long resolved.  Patient was previously on midodrine and stress dose steroids for blood pressure support.  These have been weaned off.  Initial blood cultures positive for Staphylococcus capitis.  Subsequently patient developed recurrent fevers.  Repeat blood culture positive for Pseudomonas.  Source appears to be multiple skin infections.  Cultured by ID. Plan: Continue meropenem with pharmacy dosing assistance.  Infectious disease following.  Monitor vitals and fever curve.  Antibiotic end date 5/25.  Will plan to keep patient hospitalized until 5/25 and discharged home after  Left lower extremity DVT.   -Noted on ultrasound 5/8  -Started Xarelto 5/11  -No respiratory symptoms, no indication for VQ scan, given patient's bedbound status he remains remarkably high risk for recurrent DVT.   AKI:  Likely ATN in the setting of septic shock, improved Bicarb drip discontinued 5/17 P.o. bicarb initiated Ultrasound unremarkable, urine output remains appropriate Nephrology consulted, no indication for dialysis Kidney function improving Renal indices continue to improve as of 5/20 No further recommendations from nephrology.  Following peripherally   Chronic combined systolic /diastolic CHF:  Echo done on 4/24 showed EF of 35 to 40%, global hypokinesis, grade 1 diastolic dysfunction.   Appears euvolemic  Hypokalemia: Improved.  Continue to monitor and replete as needed.    Severe hypothyroidism:  Completed IV Synthroid dosing, continue p.o. Synthroid.   Recommend follow-up thyroid function test in 4 to 6 weeks.   Leukocytosis/thrombocytosis: Resolved, questionably reactive given above.   Anemia of chronic disease:  Status post 3 unit PRBC this hospitalization Low threshold for imaging given newly prescribed Xarelto   Chronic sinus tachycardia:  Chronic problem.  QTc also prolonged.  Avoid QTc prolonging medications.  On low-dose metoprolol   Depression: Continue Remeron   Chronic abdomen pain/diarrhea :He complains of chronic abdominal pain and fear of eating as it precipitates diarrhea.  Continue Creon - appetite/diarrhea and bloating sensations appear to be improving.  Patient tolerating p.o. intake   Morbid obesity/bedbound status: PT/OT recommending Long-term facility.  TOC following.  Unfortunately bed offer at [facility] health care was rescinded for reasons that are unclear.  [facility] a bed.  Plan: No acceptable options were found.  Patient will discharge home.  Unfortunately home health is not an option.  Patient and mother are aware.   Goals of care: Morbidly obese patient with multiple medical problems.  History of bariatric surgery, OSA, obesity hypoventilation syndrome, chronic lymphedema, status post IVC filter placement. bedbound status.  Palliative care was following for goals of care, remains full code.  Very high risk for hospital readmission.  DVT prophylaxis: Xarelto Code Status: Full Family Communication: None Disposition Plan: Status is: Inpatient Remains inpatient appropriate because: Pseudomonas bacteremia on IV antibiotics   Level of care: Med-Surg  Consultants:  Nephrology ID  Procedures:  None  Antimicrobials: Meropenem   Subjective: Seen and examined.  Feels well this morning.  Tolerating p.o. intake.  Getting fed by tach.  Objective: Vitals:   05/18/23 1815  05/19/23 0413 05/19/23 0417 05/19/23 0800  BP: 134/80 120/87 120/87 116/77  Pulse: 100 (!) 103 (!) 103 (!) 105  Resp: 16  17 18   Temp: 98 F (36.7 C) 98.2 F (36.8 C) 98.2 F (36.8 C) 98 F (36.7 C)  TempSrc: Oral Oral Oral Oral  SpO2: 100% 100% 100% 100%  Weight:      Height:        Intake/Output Summary (Last 24 hours) at 05/19/2023 1400 Last data filed at 05/19/2023 0526 Gross per 24 hour  Intake --  Output 1050 ml  Net -1050 ml   Filed Weights   05/14/23 0437 05/15/23 0458 05/18/23 0542  Weight: (!) 175.2 kg 78.7 kg (!) 175.6 kg    Examination:  General exam: NAD.  Appears frail and chronically ill Respiratory system: Poor respiratory effort.  Normal work of breathing.  Room air Cardiovascular system: S1-S2, RRR, no murmurs, marked lymphedema bilateral lower extremities Gastrointestinal system: Obese, soft, NT/ND,+ bowel sounds Central nervous system: Alert and oriented. No focal neurological deficits. Extremities: Markedly decreased power bilateral upper and lower extremities Skin: Multiple areas of skin lesions and breakdown in various levels of healing Psychiatry: Judgement and insight appear normal. Mood & affect appropriate.     Data Reviewed: I have personally reviewed following labs and imaging studies  CBC: Recent Labs  Lab 05/13/23 0859 05/14/23 0600 05/18/23 0704  WBC 10.2 10.7* 7.3  NEUTROABS 8.1* 8.1* 3.9  HGB 8.4* 8.1* 8.9*  HCT 25.0* 24.8* 27.8*  MCV 85.0 87.0 88.0  PLT 251 276 526*   Basic Metabolic Panel: Recent Labs  Lab 05/13/23 0859 05/14/23 0600 05/18/23 0704 05/19/23 0349  NA 136 135 136  --  K 3.2* 3.7 4.1  --   CL 107 107 105  --   CO2 20* 18* 21*  --   GLUCOSE 84 87 79  --   BUN 60* 59* 54*  --   CREATININE 2.70* 2.37* 2.13* 2.12*  CALCIUM 8.0* 8.0* 8.4*  --   MG  --   --  1.8  --   PHOS 3.0 2.7 3.4  --    GFR: Estimated Creatinine Clearance: 66.7 mL/min (A) (by C-G formula based on SCr of 2.12 mg/dL (H)). Liver  Function Tests: Recent Labs  Lab 05/14/23 0600 05/18/23 0704  AST 15  --   ALT 14  --   ALKPHOS 54  --   BILITOT 0.6  --   PROT 4.7*  --   ALBUMIN 2.3* 2.1*   No results for input(s): "LIPASE", "AMYLASE" in the last 168 hours. No results for input(s): "AMMONIA" in the last 168 hours. Coagulation Profile: No results for input(s): "INR", "PROTIME" in the last 168 hours. Cardiac Enzymes: No results for input(s): "CKTOTAL", "CKMB", "CKMBINDEX", "TROPONINI" in the last 168 hours. BNP (last 3 results) No results for input(s): "PROBNP" in the last 8760 hours. HbA1C: No results for input(s): "HGBA1C" in the last 72 hours. CBG: No results for input(s): "GLUCAP" in the last 168 hours.  Lipid Profile: No results for input(s): "CHOL", "HDL", "LDLCALC", "TRIG", "CHOLHDL", "LDLDIRECT" in the last 72 hours. Thyroid Function Tests: No results for input(s): "TSH", "T4TOTAL", "FREET4", "T3FREE", "THYROIDAB" in the last 72 hours. Anemia Panel: No results for input(s): "VITAMINB12", "FOLATE", "FERRITIN", "TIBC", "IRON", "RETICCTPCT" in the last 72 hours. Sepsis Labs: No results for input(s): "PROCALCITON", "LATICACIDVEN" in the last 168 hours.   Recent Results (from the past 240 hour(s))  Culture, blood (Routine X 2) w Reflex to ID Panel     Status: None   Collection Time: 05/11/23 11:08 AM   Specimen: BLOOD  Result Value Ref Range Status   Specimen Description BLOOD LEFT [facility]  Final   Special Requests   Final    BOTTLES DRAWN AEROBIC AND ANAEROBIC Blood Culture adequate volume   Culture   Final    NO GROWTH 5 DAYS Performed at [facility], [address] [place], [place] [address]    Report Status 05/16/2023 FINAL  Final  Culture, blood (Routine X 2) w Reflex to ID Panel     Status: None   Collection Time: 05/11/23 11:08 AM   Specimen: BLOOD  Result Value Ref Range Status   Specimen Description BLOOD LEFT WRIST  Final   Special Requests   Final    BOTTLES DRAWN AEROBIC  AND ANAEROBIC Blood Culture adequate volume   Culture   Final    NO GROWTH 5 DAYS Performed at [facility], [address] [place], [place] [address]    Report Status 05/16/2023 FINAL  Final  Aerobic Culture w Gram Stain (superficial specimen)     Status: None   Collection Time: 05/11/23  2:25 PM   Specimen: Thigh; Wound  Result Value Ref Range Status   Specimen Description   Final    THIGH Performed at [facility], [address] [place], [place] [address]    Special Requests   Final    LEFT THIGH Performed at [facility], [address] [place], [place] [address]    Gram Stain   Final    NO WBC SEEN NO ORGANISMS SEEN Performed at [facility], [address],  [place], [place] [address]    Culture RARE PSEUDOMONAS AERUGINOSA  Final   Report Status 05/14/2023 FINAL  Final   Organism ID, Bacteria PSEUDOMONAS AERUGINOSA  Final      Susceptibility   Pseudomonas aeruginosa - MIC*    CEFTAZIDIME 16 INTERMEDIATE Intermediate     CIPROFLOXACIN 1 INTERMEDIATE Intermediate     GENTAMICIN <=1 SENSITIVE Sensitive     IMIPENEM 2 SENSITIVE Sensitive     * RARE PSEUDOMONAS AERUGINOSA  Aerobic Culture w Gram Stain (superficial specimen)     Status: None   Collection Time: 05/11/23  2:30 PM   Specimen: Back; Wound  Result Value Ref Range Status   Specimen Description   Final    BACK Performed at [facility], [address], [place], [place] [address]    Special Requests   Final    BACK Performed at [facility], [address] [place], [place] [address]    Gram Stain   Final    NO WBC SEEN NO ORGANISMS SEEN Performed at [facility], [address], [place], [place] [address]    Culture   Final    FEW PSEUDOMONAS AERUGINOSA FEW ACINETOBACTER BAUMANNII    Report Status 05/14/2023 FINAL  Final   Organism ID, Bacteria PSEUDOMONAS AERUGINOSA  Final   Organism ID, Bacteria ACINETOBACTER BAUMANNII  Final       Susceptibility   Acinetobacter baumannii - MIC*    CEFTAZIDIME >=64 RESISTANT Resistant     CIPROFLOXACIN >=4 RESISTANT Resistant     GENTAMICIN >=16 RESISTANT Resistant     IMIPENEM 1 SENSITIVE Sensitive     PIP/TAZO >=128 RESISTANT Resistant     TRIMETH/SULFA >=320 RESISTANT Resistant     AMPICILLIN/SULBACTAM 4 SENSITIVE Sensitive     * FEW ACINETOBACTER BAUMANNII   Pseudomonas aeruginosa - MIC*    CEFTAZIDIME 16 INTERMEDIATE Intermediate     CIPROFLOXACIN 1 INTERMEDIATE Intermediate     GENTAMICIN 2 SENSITIVE Sensitive     IMIPENEM 2 SENSITIVE Sensitive     * FEW PSEUDOMONAS AERUGINOSA         Radiology Studies: No results found.      Scheduled Meds:  vitamin C  500 mg Oral BID   copper  8 mg Oral Daily   feeding supplement  1 Container Oral TID BM   folic acid  1 mg Oral Daily   hydrocerin   Topical BID   lactase  6,000 Units Oral TID WC   levothyroxine  100 mcg Oral Q0600   metoprolol succinate  12.5 mg Oral Daily   mirtazapine  15 mg Oral QHS   multivitamin with minerals  1 tablet Oral BID   pantoprazole  40 mg Oral QHS   Rivaroxaban  15 mg Oral BID WC   Followed by   Melene Muller ON 05/30/2023] rivaroxaban  20 mg Oral Q supper   saccharomyces boulardii  250 mg Oral BID   sodium bicarbonate  650 mg Oral BID   thiamine  100 mg Oral Daily   vitamin A  10,000 Units Oral Daily   Vitamin D (Ergocalciferol)  50,000 Units Oral Q7 days   Vitamin E  400 Units Oral Daily   zinc sulfate  220 mg Oral Daily   Continuous Infusions:  meropenem (MERREM) IV 1 g (05/19/23 1323)     LOS: 28 days     Tresa Moore, MD [facility]   If 7PM-7AM, please contact night-coverage  05/19/2023, 2:00 PM

## 2023-05-19 NOTE — TOC Progression Note (Signed)
Transition of Care [facility]) - Progression Note    Patient Details  Name: Christopher Herman MRN: [id number] Date of Birth: 08-02-1976  Transition of [facility] CM/SW Contact  [name], RN Phone Number: 05/19/2023, 1:51 PM  Clinical Narrative:      [name] to [facility] All agencies state they are unable to accept referral   Met with patient at bedside His mother Christopher Herman was on the phone on speaker phone  Patient is aware that home health is not an option at discharge Mother is agreeable for patient to return home, confirms she will provide all of his care including wound care at discharge.  Both are aware that patient does not have a long term payor for LTC placement.  Both patient and mother state the plan is for patient to still work when he discharge  Mother in agreement for [facility] and hoyer lift to be delivered to the home.  Patient declines hospital bed.  Patient states that he has been bed bound for the last year  Patient states that him and his mother will discuss the option again of SNF today and let me know what the final decision is.  Patient and mother are aware that it would be for short stay, antibiotics will be complete on Saturday,  and insurance would like not approve for STR   Expected Discharge Plan: Skilled Nursing Facility Barriers to Discharge: Continued Medical Work up  Expected Discharge Plan and Services     Post Acute Care Choice: Skilled Nursing Facility Living arrangements for the past 2 months: Single Family Home                                       Social Determinants of Health (SDOH) Interventions SDOH Screenings   Food Insecurity: No Food Insecurity (11/04/2022)  Housing: Low Risk  (11/04/2022)  Transportation Needs: No Transportation Needs (11/04/2022)  Alcohol Screen: Low Risk  (09/13/2018)  Depression (PHQ2-9): Low Risk  (11/04/2022)  Physical Activity: Inactive (11/04/2022)  Tobacco Use: Medium Risk (04/21/2023)     Readmission Risk Interventions     No data to display

## 2023-05-20 DIAGNOSIS — E039 Hypothyroidism, unspecified: Secondary | ICD-10-CM | POA: Diagnosis not present

## 2023-05-20 NOTE — Progress Notes (Signed)
Occupational Therapy Treatment Patient Details Name: Christopher Herman MRN: [id number] DOB: 11/11/1976 Today's Date: 05/20/2023   History of present illness 47 y/o male presented to ED on 04/21/23 for weakness, extreme fatigue, unintended weight loss, disuse of UE and poor P.O. intake. Admitted for sepsis 2/2 unknown etiology and severe hypothyroidism. PMH: severe obesity s/p bariatric surgery, OSA, hypoventilation syndrome, HTN, cholelithiasis, bilateral extremity lipoma, chronic lymphedema s/p IVC filter placement, IDA   OT comments  Patient received supine in bed and agreeable to OT. Medium-soft theraputty and HEP handout provided to pt for improved hand strength/FMC. OT demonstrating all exercises this date and pt verbalized understanding. Pt able to provide teach back for several exercises while using theraputty in R hand including gross grasp, thumb flexion, and lateral pinch. OT will continue to address in future sessions to improve B hand function for increased independence in UB ADLs. Pt left as received with all needs in reach. Pt is making progress toward goal completion. D/C recommendation remains appropriate. OT will continue to follow acutely.    Recommendations for follow up therapy are one component of a multi-disciplinary discharge planning process, led by the attending physician.  Recommendations may be updated based on patient status, additional functional criteria and insurance authorization.    Assistance Recommended at Discharge Frequent or constant Supervision/Assistance  Patient can return home with the following  Two people to help with bathing/dressing/bathroom;Two people to help with walking and/or transfers;Assistance with cooking/housework;Assist for transportation;Help with stairs or ramp for entrance   Equipment Recommendations  Other (comment) (self-feeding AE)    Recommendations for Other Services      Precautions / Restrictions Precautions Precautions:  Fall Restrictions Weight Bearing Restrictions: No       Mobility Bed Mobility      Transfers           Balance           ADL either performed or assessed with clinical judgement   ADL Overall ADL's : Needs assistance/impaired          Extremity/Trunk Assessment Upper Extremity Assessment Upper Extremity Assessment: Generalized weakness   Lower Extremity Assessment Lower Extremity Assessment: Generalized weakness        Vision Patient Visual Report: No change from baseline     Perception     Praxis      Cognition Arousal/Alertness: Awake/alert Behavior During Therapy: WFL for tasks assessed/performed Overall Cognitive Status: Within Functional Limits for tasks assessed          Exercises Other Exercises Other Exercises: Medium-soft theraputty and HEP handout provided to pt for improved hand strength and FMC. OT demonstrating all exercises this date and pt verbalized understanding.    Shoulder Instructions       General Comments      Pertinent Vitals/ Pain       Pain Assessment Pain Assessment: No/denies pain  Home Living            Prior Functioning/Environment              Frequency  Min 1X/week        Progress Toward Goals  OT Goals(current goals can now be found in the care plan section)  Progress towards OT goals: Progressing toward goals  Acute Rehab OT Goals Patient Stated Goal: To be able to crochet again. OT Goal Formulation: With patient Time For Goal Achievement: 05/27/23 Potential to Achieve Goals: Fair  Plan Discharge plan remains appropriate;Frequency remains appropriate    Co-evaluation  AM-PAC OT "6 Clicks" Daily Activity     Outcome Measure   Help from another person eating meals?: A Lot Help from another person taking care of personal grooming?: A Lot Help from another person toileting, which includes using toliet, bedpan, or urinal?: Total Help from another person bathing  (including washing, rinsing, drying)?: Total Help from another person to put on and taking off regular upper body clothing?: A Lot Help from another person to put on and taking off regular lower body clothing?: Total 6 Click Score: 9    End of Session Equipment Utilized During Treatment: Other (comment) (theraputty)  OT Visit Diagnosis: Muscle weakness (generalized) (M62.81)   Activity Tolerance Patient tolerated treatment well   Patient Left in bed;with call bell/phone within reach;with family/visitor present   Nurse Communication Mobility status        Time: 1610-9604 OT Time Calculation (min): 8 min  Charges: OT General Charges $OT Visit: 1 Visit OT Treatments $Therapeutic Exercise: 8-22 mins  [facility] MS, OTR/L ascom [phone]  05/20/23, 5:40 PM

## 2023-05-20 NOTE — Progress Notes (Signed)
PROGRESS NOTE    Christopher Herman  ZOX:[id number] DOB: [birth year] DOA: 04/21/2023 PCP: Pcp, No    Brief Narrative:  Patient is a 47 y.o. male with a PMH significant for severe obesity s/p bariatric surgery, OSA, hypoventilation syndrome, HTN, GERD, cholelithiasis, bilateral extremity, chronic lymphedema ,status post IVC filter placement, IDA who presented from home to the ED on 04/21/2023 with generalized weakness, extreme fatigue, unintended weight loss, disuse of upper extremities and poor p.o. intake.Marland Kitchen He is chronically bed bound and malnourished.  Initially septic shock was suspected, admitted to ICU, started on broad-spectrum antibiotics.  Also required vasopressors in the interim but have not been discontinued. Hospital course remarkable for worsening kidney function.  Nephrology following, suspected to have ATN given septic shock.  PT/OT recommending SNF/long-term care.  TOC following.  Palliative care was also consulted for goals of care.  Remains full code.   5/8: DVT study positive 5/11: Febrile, repeat cultures sent, intermittent diarrhea noted.  Resume vancomycin pending cultures.  Initiate Xarelto 5/12: Blood culture with Pseudomonas-vancomycin/ceftriaxone changed to cefepime.  Hemoglobin decreased to 6.5, without overt bleeding. 1 unit of PRBC.   5/13-14: ID consulted for bacteremia with Pseudomonas aeruginosa.  5/15: Remains on meropenem for Pseudomonas bacteremia 5/16: Unfortunately admissions coordinator at [facility] health care notified us that the bed offer was rescinded.  Multiple reasons given 5/17: Notified by TOC that select LT[facility] is also declined placement.  Patient was reluctant to leave the area 5/20: Patient did have bed offers in [place].  He states these are too far from his house.  Wishes to go home at time of discharge. 5/21: ALL home health agencies have declined.  Patient will remain in the hospital through 5/25 to complete his antibiotic course, then  discharge home in the care of his mother.   Assessment & Plan:   Principal Problem:   Severe hypothyroidism Active Problems:   Malnutrition of moderate degree (HCC)   Generalized weakness   Ulcer of right ankle (HCC)   Hypotension due to hypovolemia   Acute renal failure due to tubular necrosis (HCC)   Hypothyroidism   Multiple wounds   Bacteremia due to Pseudomonas   Lymphedema associated with obesity  Septic shock secondary to staph bacteremia with repeat culture showing Pseudomonas, POA:  Shock physiology has long resolved.  Patient was previously on midodrine and stress dose steroids for blood pressure support.  These have been weaned off.  Initial blood cultures positive for Staphylococcus capitis.  Subsequently patient developed recurrent fevers.  Repeat blood culture positive for Pseudomonas.  Source appears to be multiple skin infections.  Cultured by ID. Plan: Continue meropenem with pharmacy dosing assistance.  Infectious disease following.  Monitor vitals and fever curve.  Antibiotic end date 5/25.  Will plan to keep patient hospitalized until 5/25 and discharged home after  Left lower extremity DVT.   -Noted on ultrasound 5/8  -Started Xarelto 5/11  - has an ivc filter -No respiratory symptoms, no indication for VQ scan, given patient's bedbound status he remains remarkably high risk for recurrent DVT.   AKI:  Likely ATN in the setting of septic shock, improved Bicarb drip discontinued 5/17 P.o. bicarb initiated Ultrasound unremarkable, urine output remains appropriate Nephrology consulted, no indication for dialysis Kidney function improving Renal indices continue to improve as of 5/20 No further recommendations from nephrology.  Following peripherally   Chronic combined systolic /diastolic CHF:  Echo done on 4/24 showed EF of 35 to 40%, global hypokinesis, grade 1 diastolic  dysfunction.   Appears euvolemic   Hypokalemia: Improved.  Continue to monitor and  replete as needed.   Severe hypothyroidism:  Completed IV Synthroid dosing, continue p.o. Synthroid.   Recommend follow-up thyroid function test in 4 to 6 weeks.   Leukocytosis/thrombocytosis: Resolved, questionably reactive given above.   Anemia of chronic disease:  Status post 3 unit PRBC this hospitalization Low threshold for imaging given newly prescribed Xarelto   Chronic sinus tachycardia:  Chronic problem.  QTc also prolonged.  Avoid QTc prolonging medications.  On low-dose metoprolol   Depression: Continue Remeron   Chronic abdomen pain/diarrhea :He complains of chronic abdominal pain and fear of eating as it precipitates diarrhea.  Continue Creon - appetite/diarrhea and bloating sensations appear to be improving.  Patient tolerating p.o. intake   Morbid obesity/bedbound status: PT/OT recommending Long-term facility.  TOC following.  Unfortunately bed offer at [facility] care was rescinded for reasons that are unclear.  [facility] a bed.  Plan: No acceptable options were found.  Patient will discharge home.  Unfortunately home health is not an option.  Patient and mother are aware.   Goals of care: Morbidly obese patient with multiple medical problems.  History of bariatric surgery, OSA, obesity hypoventilation syndrome, chronic lymphedema, status post IVC filter placement. bedbound status.  Palliative care was following for goals of care, remains full code.  Very high risk for hospital readmission.  DVT prophylaxis: Xarelto Code Status: Full Family Communication: None Disposition Plan: Status is: Inpatient Remains inpatient appropriate because: Pseudomonas bacteremia on IV antibiotics   Level of care: Med-Surg  Consultants:  Nephrology ID  Procedures:  None  Antimicrobials: Meropenem   Subjective: Seen and examined.  Feels well this morning.  Tolerating p.o. intake.    Objective: Vitals:   05/19/23 2048  05/20/23 0624 05/20/23 0730 05/20/23 1538  BP: 114/83  117/85 113/79  Pulse: (!) 103  100 (!) 101  Resp:   19 16  Temp: 97.9 F (36.6 C)  98.4 F (36.9 C) 98.2 F (36.8 C)  TempSrc: Oral  Oral Oral  SpO2: 100%  99% 100%  Weight:  (!) 176.3 kg    Height:        Intake/Output Summary (Last 24 hours) at 05/20/2023 1551 Last data filed at 05/20/2023 5409 Gross per 24 hour  Intake 50 ml  Output 1200 ml  Net -1150 ml   Filed Weights   05/15/23 0458 05/18/23 0542 05/20/23 0624  Weight: 78.7 kg (!) 175.6 kg (!) 176.3 kg    Examination:  General exam: NAD.  Appears frail and chronically ill Respiratory system: Poor respiratory effort.  Normal work of breathing.  Room air Cardiovascular system: S1-S2, RRR, no murmurs, marked lymphedema bilateral lower extremities Gastrointestinal system: Obese, soft, NT/ND,+ bowel sounds Central nervous system: Alert and oriented. No focal neurological deficits. Extremities: Markedly decreased power bilateral upper and lower extremities Skin: Multiple areas of skin lesions and breakdown in various levels of healing Psychiatry: Judgement and insight appear normal. Mood & affect appropriate.     Data Reviewed: I have personally reviewed following labs and imaging studies  CBC: Recent Labs  Lab 05/14/23 0600 05/18/23 0704  WBC 10.7* 7.3  NEUTROABS 8.1* 3.9  HGB 8.1* 8.9*  HCT 24.8* 27.8*  MCV 87.0 88.0  PLT 276 526*   Basic Metabolic Panel: Recent Labs  Lab 05/14/23 0600 05/18/23 0704 05/19/23 0349  NA 135 136  --   K 3.7 4.1  --  CL 107 105  --   CO2 18* 21*  --   GLUCOSE 87 79  --   BUN 59* 54*  --   CREATININE 2.37* 2.13* 2.12*  CALCIUM 8.0* 8.4*  --   MG  --  1.8  --   PHOS 2.7 3.4  --    GFR: Estimated Creatinine Clearance: 66.8 mL/min (A) (by C-G formula based on SCr of 2.12 mg/dL (H)). Liver Function Tests: Recent Labs  Lab 05/14/23 0600 05/18/23 0704  AST 15  --   ALT 14  --   ALKPHOS 54  --   BILITOT 0.6  --    PROT 4.7*  --   ALBUMIN 2.3* 2.1*   No results for input(s): "LIPASE", "AMYLASE" in the last 168 hours. No results for input(s): "AMMONIA" in the last 168 hours. Coagulation Profile: No results for input(s): "INR", "PROTIME" in the last 168 hours. Cardiac Enzymes: No results for input(s): "CKTOTAL", "CKMB", "CKMBINDEX", "TROPONINI" in the last 168 hours. BNP (last 3 results) No results for input(s): "PROBNP" in the last 8760 hours. HbA1C: No results for input(s): "HGBA1C" in the last 72 hours. CBG: No results for input(s): "GLUCAP" in the last 168 hours.  Lipid Profile: No results for input(s): "CHOL", "HDL", "LDLCALC", "TRIG", "CHOLHDL", "LDLDIRECT" in the last 72 hours. Thyroid Function Tests: No results for input(s): "TSH", "T4TOTAL", "FREET4", "T3FREE", "THYROIDAB" in the last 72 hours. Anemia Panel: No results for input(s): "VITAMINB12", "FOLATE", "FERRITIN", "TIBC", "IRON", "RETICCTPCT" in the last 72 hours. Sepsis Labs: No results for input(s): "PROCALCITON", "LATICACIDVEN" in the last 168 hours.   Recent Results (from the past 240 hour(s))  Culture, blood (Routine X 2) w Reflex to ID Panel     Status: None   Collection Time: 05/11/23 11:08 AM   Specimen: BLOOD  Result Value Ref Range Status   Specimen Description BLOOD LEFT [facility]  Final   Special Requests   Final    BOTTLES DRAWN AEROBIC AND ANAEROBIC Blood Culture adequate volume   Culture   Final    NO GROWTH 5 DAYS Performed at [facility], [address] [place], [place] [address]    Report Status 05/16/2023 FINAL  Final  Culture, blood (Routine X 2) w Reflex to ID Panel     Status: None   Collection Time: 05/11/23 11:08 AM   Specimen: BLOOD  Result Value Ref Range Status   Specimen Description BLOOD LEFT WRIST  Final   Special Requests   Final    BOTTLES DRAWN AEROBIC AND ANAEROBIC Blood Culture adequate volume   Culture   Final    NO GROWTH 5 DAYS Performed at [facility], [address], [place], [place] [address]    Report Status 05/16/2023 FINAL  Final  Aerobic Culture w Gram Stain (superficial specimen)     Status: None   Collection Time: 05/11/23  2:25 PM   Specimen: Thigh; Wound  Result Value Ref Range Status   Specimen Description   Final    THIGH Performed at [facility], [address], [place], [place] [address]    Special Requests   Final    LEFT THIGH Performed at [facility], [address] [place], [place] [address]    Gram Stain   Final    NO WBC SEEN NO ORGANISMS SEEN Performed at [facility], [address], [place], [place] [address]    Culture RARE PSEUDOMONAS AERUGINOSA  Final   Report Status 05/14/2023 FINAL  Final   Organism ID, Bacteria PSEUDOMONAS AERUGINOSA  Final      Susceptibility   Pseudomonas aeruginosa - MIC*    CEFTAZIDIME 16 INTERMEDIATE Intermediate     CIPROFLOXACIN 1 INTERMEDIATE Intermediate     GENTAMICIN <=1 SENSITIVE Sensitive     IMIPENEM 2 SENSITIVE Sensitive     * RARE PSEUDOMONAS AERUGINOSA  Aerobic Culture w Gram Stain (superficial specimen)     Status: None   Collection Time: 05/11/23  2:30 PM   Specimen: Back; Wound  Result Value Ref Range Status   Specimen Description   Final    BACK Performed at [facility], [address], [place], [place] [address]    Special Requests   Final    BACK Performed at [facility], [address] [place], [place] [address]    Gram Stain   Final    NO WBC SEEN NO ORGANISMS SEEN Performed at [facility], [address] [place], [place] [address]    Culture   Final    FEW PSEUDOMONAS AERUGINOSA FEW ACINETOBACTER BAUMANNII    Report Status 05/14/2023 FINAL  Final   Organism ID, Bacteria PSEUDOMONAS AERUGINOSA  Final   Organism ID, Bacteria ACINETOBACTER BAUMANNII  Final      Susceptibility   Acinetobacter baumannii - MIC*    CEFTAZIDIME >=64 RESISTANT Resistant     CIPROFLOXACIN >=4 RESISTANT  Resistant     GENTAMICIN >=16 RESISTANT Resistant     IMIPENEM 1 SENSITIVE Sensitive     PIP/TAZO >=128 RESISTANT Resistant     TRIMETH/SULFA >=320 RESISTANT Resistant     AMPICILLIN/SULBACTAM 4 SENSITIVE Sensitive     * FEW ACINETOBACTER BAUMANNII   Pseudomonas aeruginosa - MIC*    CEFTAZIDIME 16 INTERMEDIATE Intermediate     CIPROFLOXACIN 1 INTERMEDIATE Intermediate     GENTAMICIN 2 SENSITIVE Sensitive     IMIPENEM 2 SENSITIVE Sensitive     * FEW PSEUDOMONAS AERUGINOSA         Radiology Studies: No results found.      Scheduled Meds:  vitamin C  500 mg Oral BID   copper  8 mg Oral Daily   feeding supplement  1 Container Oral TID BM   folic acid  1 mg Oral Daily   hydrocerin   Topical BID   lactase  6,000 Units Oral TID WC   levothyroxine  100 mcg Oral Q0600   lipase/protease/amylase  24,000 Units Oral TID WC   metoprolol succinate  12.5 mg Oral Daily   mirtazapine  15 mg Oral QHS   multivitamin with minerals  1 tablet Oral BID   pantoprazole  40 mg Oral QHS   Rivaroxaban  15 mg Oral BID WC   Followed by   Melene Muller ON 05/30/2023] rivaroxaban  20 mg Oral Q supper   saccharomyces boulardii  250 mg Oral BID   sodium bicarbonate  650 mg Oral BID   thiamine  100 mg Oral Daily   vitamin A  10,000 Units Oral Daily   Vitamin D (Ergocalciferol)  50,000 Units Oral Q7 days   Vitamin E  400 Units Oral Daily   zinc sulfate  220 mg Oral Daily   Continuous Infusions:  meropenem (MERREM) IV 1 g (05/20/23 1510)     LOS: 29 days     Silvano Bilis, MD [facility]   If 7PM-7AM, please contact night-coverage  05/20/2023, 3:51 PM

## 2023-05-20 NOTE — TOC Progression Note (Signed)
Transition of Care [facility]) - Progression Note    Patient Details  Name: Christopher Herman MRN: [id number] Date of Birth: Aug 11, 1976  [facility] CM/SW Contact  [name], RN Phone Number: 05/20/2023, 11:38 AM  Clinical Narrative:     Large weight  difference noted in chart.  Requested patient to be re weighed in order to obtain DME for discharge     Expected Discharge Plan: Skilled Nursing Facility Barriers to Discharge: Continued Medical Work up  Expected Discharge Plan and Services     Post Acute Care Choice: Skilled Nursing Facility Living arrangements for the past 2 months: Single Family Home                                       Social Determinants of Health (SDOH) Interventions SDOH Screenings   Food Insecurity: No Food Insecurity (11/04/2022)  Housing: Low Risk  (11/04/2022)  Transportation Needs: No Transportation Needs (11/04/2022)  Alcohol Screen: Low Risk  (09/13/2018)  Depression (PHQ2-9): Low Risk  (11/04/2022)  Physical Activity: Inactive (11/04/2022)  Tobacco Use: Medium Risk (04/21/2023)    Readmission Risk Interventions     No data to display

## 2023-05-20 NOTE — Progress Notes (Signed)
Nutrition Follow-up  DOCUMENTATION CODES:   Non-severe (moderate) malnutrition in context of chronic illness  INTERVENTION:   -Continue MVI with minerals BID -Continue Snacks TID with meals -Continue Vitamin C 500mg  po BID -Continue Folic acid 1mg  po daily  -Continue Thiamine 100mg  po daily  -Continue Copper 8mg  po daily  -Continue Vitamin A 10,000 units po daily  -Continue Ergocalciferol 50,000 units po weekly -Continue Vitamin E 400 units po daily  -Continue Lactaid with meals  -Continue Creon 24,000 units po with meals -Continue 250 mg florastor BID  NUTRITION DIAGNOSIS:   Moderate Malnutrition related to chronic illness (h/o roux-en-y gastric bypass) as evidenced by moderate fat depletion, moderate muscle depletion, percent weight loss.  Ongoing  GOAL:   Patient will meet greater than or equal to 90% of their needs  Progressing   MONITOR:   PO intake, Supplement acceptance, Labs, Weight trends, Skin, I & O's  REASON FOR ASSESSMENT:   Rounds    ASSESSMENT:   47 y/o male with h/o hypothyroidism, anxiety, morbid obesity s/p roux-en-y (2013), anxiety, HTN, OSA, GERD, IVC filter, H. pylori, lymphedema and cholelithiasis who is admitted with septic shock.  Reviewed I/O's: -1.2 L x 24 hours and +3.8 L since 05/06/23  UOP: 1.2 L x 24 hours   Pt lying in bed at time of visit, more interactive in comparison to previous visit. Pt reports feeling well and with improved appetite. Noted meal completions 70-100%. Pt is waiting for his breakfast to arrive- ordered omelette and [place] sausage. Pt shares that he continues to focus on his protein intake. He is accepting of snack items from nutritional services department and reports his mom "brings me in snacks like slim jims and other things". Reinforced importance of good meal intake to promote healing.   Multiple supplements have been d/c due to pt's refusal.   Per TOC notes, plan to d/c home in the care of his mother on  05/23/23 after completion of antibiotics.   Noted 2.1% wt loss over the past month, which is not significant for time frame.   Medications reviewed and include vitamin C, copper, folic acid, lactaid, creon, remeron, florastor, thiamine, vitamin A, vitamin D, vitamin E, and zinc sulfate.   Labs reviewed.   Diet Order:   Diet Order             Diet regular Room service appropriate? Yes with Assist; Fluid consistency: Thin  Diet effective now                   EDUCATION NEEDS:   Education needs have been addressed  Skin:  Skin Assessment: Reviewed RN Assessment (Right lateral malleolus:  0.3 cm x 0.2 cm x 0.1 cm, right buttocks ( in skin fold) 1 cm, edema to bilateral lower legs  nonintact lesion to left upper thigh: 3 cm x 5.2 cm)  Last BM:  05/19/23  Height:   Ht Readings from Last 1 Encounters:  04/21/23 5\' 5"  (1.651 m)    Weight:   Wt Readings from Last 1 Encounters:  05/20/23 (!) 176.3 kg    Ideal Body Weight:  61.8 kg  BMI:  Body mass index is 64.68 kg/m.  Estimated Nutritional Needs:   Kcal:  2300-2500  Protein:  140-155 grams  Fluid:  > 2 L    Levada Schilling, RD, LDN, CDCES Registered Dietitian II Certified Diabetes Care and Education Specialist Please refer to [facility] for RD and/or RD on-call/weekend/after hours pager

## 2023-05-21 DIAGNOSIS — E039 Hypothyroidism, unspecified: Secondary | ICD-10-CM | POA: Diagnosis not present

## 2023-05-21 LAB — BASIC METABOLIC PANEL
Anion gap: 10 (ref 5–15)
BUN: 45 mg/dL — ABNORMAL HIGH (ref 6–20)
CO2: 20 mmol/L — ABNORMAL LOW (ref 22–32)
Calcium: 8.4 mg/dL — ABNORMAL LOW (ref 8.9–10.3)
Chloride: 107 mmol/L (ref 98–111)
Creatinine, Ser: 1.76 mg/dL — ABNORMAL HIGH (ref 0.61–1.24)
GFR, Estimated: 48 mL/min — ABNORMAL LOW (ref >60)
Glucose, Bld: 76 mg/dL (ref 70–99)
Potassium: 3.9 mmol/L (ref 3.5–5.1)
Sodium: 137 mmol/L (ref 135–145)

## 2023-05-21 NOTE — Progress Notes (Addendum)
PROGRESS NOTE    Christopher Herman  ZOX:[id number] DOB: March 26, 1976 DOA: 04/21/2023 PCP: Pcp, No    Brief Narrative:  Patient is a 47 y.o. male with a PMH significant for severe obesity s/p bariatric surgery, OSA, hypoventilation syndrome, HTN, GERD, cholelithiasis, bilateral extremity, chronic lymphedema ,status post IVC filter placement, IDA who presented from home to the ED on 04/21/2023 with generalized weakness, extreme fatigue, unintended weight loss, disuse of upper extremities and poor p.o. intake.Marland Kitchen He is chronically bed bound and malnourished.  Initially septic shock was suspected, admitted to ICU, started on broad-spectrum antibiotics.  Also required vasopressors in the interim but have not been discontinued. Hospital course remarkable for worsening kidney function.  Nephrology following, suspected to have ATN given septic shock.  PT/OT recommending SNF/long-term care.  TOC following.  Palliative care was also consulted for goals of care.  Remains full code.   5/8: DVT study positive 5/11: Febrile, repeat cultures sent, intermittent diarrhea noted.  Resume vancomycin pending cultures.  Initiate Xarelto 5/12: Blood culture with Pseudomonas-vancomycin/ceftriaxone changed to cefepime.  Hemoglobin decreased to 6.5, without overt bleeding. 1 unit of PRBC.   5/13-14: ID consulted for bacteremia with Pseudomonas aeruginosa.  5/15: Remains on meropenem for Pseudomonas bacteremia 5/16: Unfortunately admissions coordinator at [facility] health care notified us that the bed offer was rescinded.  Multiple reasons given 5/17: Notified by TOC that select LTAC is also declined placement.  Patient was reluctant to leave the area 5/20: Patient did have bed offers in [place] in [place].  He states these are too far from his house.  Wishes to go home at time of discharge. 5/21: ALL home health agencies have declined.  Patient will remain in the hospital through 5/25 to complete his antibiotic course, then  discharge home in the care of his mother.   Assessment & Plan:   Principal Problem:   Severe hypothyroidism Active Problems:   Malnutrition of moderate degree (HCC)   Generalized weakness   Ulcer of right ankle (HCC)   Hypotension due to hypovolemia   Acute renal failure due to tubular necrosis (HCC)   Hypothyroidism   Multiple wounds   Bacteremia due to Pseudomonas   Lymphedema associated with obesity  Septic shock secondary to staph bacteremia with repeat culture showing Pseudomonas, POA:  Shock physiology has long resolved.  Patient was previously on midodrine and stress dose steroids for blood pressure support.  These have been weaned off.  Initial blood cultures positive for Staphylococcus capitis.  Subsequently patient developed recurrent fevers.  Repeat blood culture positive for Pseudomonas.  Source appears to be multiple skin infections.  Cultured by ID. Plan: Continue meropenem with pharmacy dosing assistance.  Infectious disease following.  Monitor vitals and fever curve.  Antibiotic end date 5/25.  Will plan to keep patient hospitalized until 5/25 and discharged home after  Left lower extremity DVT.   -Noted on ultrasound 5/8  -Started Xarelto 5/11  - has an ivc filter -No respiratory symptoms, no indication for VQ scan, given patient's bedbound status he remains remarkably high risk for recurrent DVT.   AKI:  Likely ATN in the setting of septic shock, improved Bicarb drip discontinued 5/17 Ultrasound unremarkable, urine output remains appropriate Nephrology consulted, no indication for dialysis Kidney function improving Renal indices continue to improve as of 5/20 No further recommendations from nephrology.  Following peripherally   Chronic combined systolic /diastolic CHF:  Echo done on 4/24 showed EF of 35 to 40%, global hypokinesis, grade 1 diastolic dysfunction.  Appears euvolemic   Hypokalemia: Improved.  Continue to monitor and replete as needed.    Severe hypothyroidism:  Completed IV Synthroid dosing, continue p.o. Synthroid.   Recommend follow-up thyroid function test in 4 to 6 weeks.   Leukocytosis/thrombocytosis: Resolved, questionably reactive given above.   Anemia of chronic disease:  Status post 3 unit PRBC this hospitalization Low threshold for imaging given newly prescribed Xarelto   Chronic sinus tachycardia:  Chronic problem.  QTc also prolonged.  Avoid QTc prolonging medications.  On low-dose metoprolol   Depression: Continue Remeron   Chronic abdomen pain/diarrhea :He complains of chronic abdominal pain and fear of eating as it precipitates diarrhea.  Continue Creon - appetite/diarrhea and bloating sensations appear to be improving.  Patient tolerating p.o. intake   Morbid obesity/bedbound status: PT/OT recommending Long-term facility.  TOC following.  Unfortunately bed offer at [facility] care was rescinded for reasons that are unclear.  [facility] a bed.  Plan: No acceptable options were found.  Patient will discharge home.  Unfortunately home health is not an option.  Patient and mother are aware.   Goals of care: Morbidly obese patient with multiple medical problems.  History of bariatric surgery, OSA, obesity hypoventilation syndrome, chronic lymphedema, status post IVC filter placement. bedbound status.  Palliative care was following for goals of care, remains full code.  Very high risk for hospital readmission.  DVT prophylaxis: Xarelto Code Status: Full Family Communication: no answer when mother called today, no voicemail Disposition Plan: Status is: Inpatient Remains inpatient appropriate because: Pseudomonas bacteremia on IV antibiotics   Level of care: Med-Surg  Consultants:  Nephrology ID  Procedures:  None  Antimicrobials: Meropenem   Subjective: Seen and examined.  Feels well this morning.  Tolerating p.o. intake.  No  pain.  Objective: Vitals:   05/20/23 2101 05/21/23 0500 05/21/23 0510 05/21/23 0827  BP: 120/80  110/77 118/87  Pulse: (!) 106  (!) 53 (!) 109  Resp: 18  20 18   Temp: 98.4 F (36.9 C)  98 F (36.7 C) 98.2 F (36.8 C)  TempSrc: Oral   Oral  SpO2: 95%  100% 100%  Weight:  (!) 178.6 kg    Height:        Intake/Output Summary (Last 24 hours) at 05/21/2023 1333 Last data filed at 05/21/2023 1610 Gross per 24 hour  Intake --  Output 1100 ml  Net -1100 ml   Filed Weights   05/18/23 0542 05/20/23 0624 05/21/23 0500  Weight: (!) 175.6 kg (!) 176.3 kg (!) 178.6 kg    Examination:  General exam: NAD.  Appears frail and chronically ill Respiratory system: Poor respiratory effort.  Normal work of breathing.  Room air Cardiovascular system: S1-S2, RRR, no murmurs, marked lymphedema bilateral lower extremities Gastrointestinal system: Obese, soft, NT/ND,+ bowel sounds Central nervous system: Alert and oriented. No focal neurological deficits. Extremities: Markedly decreased power bilateral upper and lower extremities Skin: Multiple areas of skin lesions and breakdown in various levels of healing Psychiatry: Judgement and insight appear normal. Mood & affect appropriate.     Data Reviewed: I have personally reviewed following labs and imaging studies  CBC: Recent Labs  Lab 05/18/23 0704  WBC 7.3  NEUTROABS 3.9  HGB 8.9*  HCT 27.8*  MCV 88.0  PLT 526*   Basic Metabolic Panel: Recent Labs  Lab 05/18/23 0704 05/19/23 0349 05/21/23 0436  NA 136  --  137  K 4.1  --  3.9  CL 105  --  107  CO2 21*  --  20*  GLUCOSE 79  --  76  BUN 54*  --  45*  CREATININE 2.13* 2.12* 1.76*  CALCIUM 8.4*  --  8.4*  MG 1.8  --   --   PHOS 3.4  --   --    GFR: Estimated Creatinine Clearance: 81.2 mL/min (A) (by C-G formula based on SCr of 1.76 mg/dL (H)). Liver Function Tests: Recent Labs  Lab 05/18/23 0704  ALBUMIN 2.1*   No results for input(s): "LIPASE", "AMYLASE" in the last  168 hours. No results for input(s): "AMMONIA" in the last 168 hours. Coagulation Profile: No results for input(s): "INR", "PROTIME" in the last 168 hours. Cardiac Enzymes: No results for input(s): "CKTOTAL", "CKMB", "CKMBINDEX", "TROPONINI" in the last 168 hours. BNP (last 3 results) No results for input(s): "PROBNP" in the last 8760 hours. HbA1C: No results for input(s): "HGBA1C" in the last 72 hours. CBG: No results for input(s): "GLUCAP" in the last 168 hours.  Lipid Profile: No results for input(s): "CHOL", "HDL", "LDLCALC", "TRIG", "CHOLHDL", "LDLDIRECT" in the last 72 hours. Thyroid Function Tests: No results for input(s): "TSH", "T4TOTAL", "FREET4", "T3FREE", "THYROIDAB" in the last 72 hours. Anemia Panel: No results for input(s): "VITAMINB12", "FOLATE", "FERRITIN", "TIBC", "IRON", "RETICCTPCT" in the last 72 hours. Sepsis Labs: No results for input(s): "PROCALCITON", "LATICACIDVEN" in the last 168 hours.   Recent Results (from the past 240 hour(s))  Aerobic Culture w Gram Stain (superficial specimen)     Status: None   Collection Time: 05/11/23  2:25 PM   Specimen: Thigh; Wound  Result Value Ref Range Status   Specimen Description   Final    THIGH Performed at [facility], [address], [place], [place] [address]    Special Requests   Final    LEFT THIGH Performed at [facility], [address] [place], [place] [address]    Gram Stain   Final    NO WBC SEEN NO ORGANISMS SEEN Performed at [facility], [address] [place], [place] [address]    Culture RARE PSEUDOMONAS AERUGINOSA  Final   Report Status 05/14/2023 FINAL  Final   Organism ID, Bacteria PSEUDOMONAS AERUGINOSA  Final      Susceptibility   Pseudomonas aeruginosa - MIC*    CEFTAZIDIME 16 INTERMEDIATE Intermediate     CIPROFLOXACIN 1 INTERMEDIATE Intermediate     GENTAMICIN <=1 SENSITIVE Sensitive     IMIPENEM 2 SENSITIVE Sensitive     * RARE PSEUDOMONAS AERUGINOSA   Aerobic Culture w Gram Stain (superficial specimen)     Status: None   Collection Time: 05/11/23  2:30 PM   Specimen: Back; Wound  Result Value Ref Range Status   Specimen Description   Final    BACK Performed at [facility], [address] [place], [place] [address]    Special Requests   Final    BACK Performed at [facility], [address] [place], [place] [address]    Gram Stain   Final    NO WBC SEEN NO ORGANISMS SEEN Performed at [facility], [address] [place], [place] [address]    Culture   Final    FEW PSEUDOMONAS AERUGINOSA FEW ACINETOBACTER BAUMANNII    Report Status 05/14/2023 FINAL  Final   Organism ID, Bacteria PSEUDOMONAS AERUGINOSA  Final   Organism ID, Bacteria ACINETOBACTER BAUMANNII  Final      Susceptibility  Acinetobacter baumannii - MIC*    CEFTAZIDIME >=64 RESISTANT Resistant     CIPROFLOXACIN >=4 RESISTANT Resistant     GENTAMICIN >=16 RESISTANT Resistant     IMIPENEM 1 SENSITIVE Sensitive     PIP/TAZO >=128 RESISTANT Resistant     TRIMETH/SULFA >=320 RESISTANT Resistant     AMPICILLIN/SULBACTAM 4 SENSITIVE Sensitive     * FEW ACINETOBACTER BAUMANNII   Pseudomonas aeruginosa - MIC*    CEFTAZIDIME 16 INTERMEDIATE Intermediate     CIPROFLOXACIN 1 INTERMEDIATE Intermediate     GENTAMICIN 2 SENSITIVE Sensitive     IMIPENEM 2 SENSITIVE Sensitive     * FEW PSEUDOMONAS AERUGINOSA         Radiology Studies: No results found.      Scheduled Meds:  vitamin C  500 mg Oral BID   copper  8 mg Oral Daily   feeding supplement  1 Container Oral TID BM   folic acid  1 mg Oral Daily   hydrocerin   Topical BID   lactase  6,000 Units Oral TID WC   levothyroxine  100 mcg Oral Q0600   lipase/protease/amylase  24,000 Units Oral TID WC   metoprolol succinate  12.5 mg Oral Daily   mirtazapine  15 mg Oral QHS   multivitamin with minerals  1 tablet Oral BID   pantoprazole  40 mg Oral QHS   Rivaroxaban  15 mg Oral  BID WC   Followed by   Melene Muller ON 05/30/2023] rivaroxaban  20 mg Oral Q supper   saccharomyces boulardii  250 mg Oral BID   sodium bicarbonate  650 mg Oral BID   thiamine  100 mg Oral Daily   vitamin A  10,000 Units Oral Daily   Vitamin D (Ergocalciferol)  50,000 Units Oral Q7 days   Vitamin E  400 Units Oral Daily   zinc sulfate  220 mg Oral Daily   Continuous Infusions:  meropenem (MERREM) IV 1 g (05/21/23 0644)     LOS: 30 days     Silvano Bilis, MD [facility]   If 7PM-7AM, please contact night-coverage  05/21/2023, 1:33 PM

## 2023-05-21 NOTE — TOC Progression Note (Signed)
Transition of Care [facility]) - Progression Note    Patient Details  Name: Christopher Herman MRN: [id number] Date of Birth: 10-25-1976  Transition of [facility] CM/SW Contact  [name], RN Phone Number: 05/21/2023, 4:05 PM  Clinical Narrative:      Patient again confirms plan will be to discharge to the care of his mother on Saturday with out home health DME order made to [facility] with [employer] for bariatric [facility] and bariatric hoyer lift    Expected Discharge Plan: Home/Self Care Barriers to Discharge: Continued Medical Work up  Expected Discharge Plan and Services     Post Acute Care Choice: Skilled Nursing Facility Living arrangements for the past 2 months: Single Family Home                                       Social Determinants of Health (SDOH) Interventions SDOH Screenings   Food Insecurity: No Food Insecurity (11/04/2022)  Housing: Low Risk  (11/04/2022)  Transportation Needs: No Transportation Needs (11/04/2022)  Alcohol Screen: Low Risk  (09/13/2018)  Depression (PHQ2-9): Low Risk  (11/04/2022)  Physical Activity: Inactive (11/04/2022)  Tobacco Use: Medium Risk (04/21/2023)    Readmission Risk Interventions     No data to display

## 2023-05-21 NOTE — Progress Notes (Cosign Needed)
    Durable Medical Equipment  (From admission, onward)           Start     Ordered   05/21/23 0923  For home use only DME Other see comment  Once       Comments: Michiel Sites lift Bariatric Patient declined hospital bed  Question:  Length of Need  Answer:  Lifetime   05/21/23 1610   05/21/23 0920  For home use only DME standard manual wheelchair with seat cushion  Once       Comments: Patient suffers from Morbid obesity, CHF which impairs their ability to perform daily activities like grooming in the home.  A walker will not resolve issue with performing activities of daily living. A wheelchair will allow patient to safely perform daily activities. Patient can safely propel the wheelchair in the home or has a caregiver who can provide assistance. Length of need Lifetime. Accessories: elevating leg rests (ELRs), wheel locks, extensions and anti-tippers.  Bariatric   05/21/23 [phone]

## 2023-05-22 DIAGNOSIS — E039 Hypothyroidism, unspecified: Secondary | ICD-10-CM | POA: Diagnosis not present

## 2023-05-22 LAB — CBC
HCT: 26 % — ABNORMAL LOW (ref 39.0–52.0)
Hemoglobin: 8.3 g/dL — ABNORMAL LOW (ref 13.0–17.0)
MCH: 28.3 pg (ref 26.0–34.0)
MCHC: 31.9 g/dL (ref 30.0–36.0)
MCV: 88.7 fL (ref 80.0–100.0)
Platelets: 565 10*3/uL — ABNORMAL HIGH (ref 150–400)
RBC: 2.93 MIL/uL — ABNORMAL LOW (ref 4.22–5.81)
RDW: 18.6 % — ABNORMAL HIGH (ref 11.5–15.5)
WBC: 10.5 10*3/uL (ref 4.0–10.5)
nRBC: 0 % (ref 0.0–0.2)

## 2023-05-22 MED ORDER — SODIUM CHLORIDE 0.9 % IV SOLN: INTRAVENOUS | Status: DC | PRN

## 2023-05-22 NOTE — Progress Notes (Signed)
EOS note:  Pt remained alert and oriented x 4. He was pleasant, took meds as ordered, and denied pain all shift. Pt had a BM today and all of his dressings were changed today with the assistance of Noelle RN, Eber Jones NT, and [employer]. Pt tolerated all well.   Pt was able to assist with turns. Plan of care continues as ordered.  Problem: Health Behavior/Discharge Planning: Goal: Ability to manage health-related needs will improve 05/22/2023 2048 by Alver Fisher, RN Outcome: Adequate for Discharge 05/22/2023 2038 by Alver Fisher, RN Outcome: Adequate for Discharge  roblem: Clinical Measurements: Goal: Ability to maintain clinical measurements within normal limits will improve Outcome: Adequate for Discharge Goal: Will remain free from infection Outcome: Adequate for Discharge Goal: Diagnostic test results will improve Outcome: Adequate for Discharge Goal: Respiratory complications will improve Outcome: Adequate for Discharge Goal: Cardiovascular complication will be avoided Outcome: Adequate for Discharge

## 2023-05-22 NOTE — Progress Notes (Signed)
OT Cancellation Note  Patient Details Name: Christopher Herman MRN: [id number] DOB: Jul 18, 1976   Cancelled Treatment:    Reason Eval/Treat Not Completed: Other (comment) (Pt stating he feels comfortable with arm/hand exercises from prior OT sessions; no new needs prior to d/c home.) Pt asking OT about why his hands are numb; OT educated pt that he would have to ask medical team about that, as there are multiple dx's that could cause that symptom and likely would need further testing.  Alvester Morin 05/22/2023, 3:15 PM

## 2023-05-22 NOTE — Plan of Care (Signed)
  Problem: Education: Goal: Knowledge of General Education information will improve Description: Including pain rating scale, medication(s)/side effects and non-pharmacologic comfort measures Outcome: Adequate for Discharge   Problem: Health Behavior/Discharge Planning: Goal: Ability to manage health-related needs will improve Outcome: Adequate for Discharge   Problem: Clinical Measurements: Goal: Ability to maintain clinical measurements within normal limits will improve Outcome: Adequate for Discharge Goal: Will remain free from infection Outcome: Adequate for Discharge Goal: Diagnostic test results will improve Outcome: Adequate for Discharge Goal: Respiratory complications will improve Outcome: Adequate for Discharge Goal: Cardiovascular complication will be avoided Outcome: Adequate for Discharge   Problem: Nutrition: Goal: Adequate nutrition will be maintained Outcome: Adequate for Discharge   Problem: Coping: Goal: Level of anxiety will decrease Outcome: Adequate for Discharge   Problem: Elimination: Goal: Will not experience complications related to bowel motility Outcome: Adequate for Discharge Goal: Will not experience complications related to urinary retention Outcome: Adequate for Discharge   Problem: Pain Managment: Goal: General experience of comfort will improve Outcome: Adequate for Discharge   Problem: Safety: Goal: Ability to remain free from injury will improve Outcome: Adequate for Discharge   

## 2023-05-22 NOTE — Progress Notes (Signed)
Pt was missing his 1700 dose of Xarelto. Notified the pharmacy that the med was missing and to reschedule the administration time.

## 2023-05-22 NOTE — Progress Notes (Signed)
PROGRESS NOTE    Christopher Herman  ZOX:[id number] DOB: 15-Nov-1976 DOA: 04/21/2023 PCP: Pcp, No    Brief Narrative:  Patient is a 47 y.o. male with a PMH significant for severe obesity s/p bariatric surgery, OSA, hypoventilation syndrome, HTN, GERD, cholelithiasis, bilateral extremity, chronic lymphedema ,status post IVC filter placement, IDA who presented from home to the ED on 04/21/2023 with generalized weakness, extreme fatigue, unintended weight loss, disuse of upper extremities and poor p.o. intake.Marland Kitchen He is chronically bed bound and malnourished.  Initially septic shock was suspected, admitted to ICU, started on broad-spectrum antibiotics.  Also required vasopressors in the interim but have not been discontinued. Hospital course remarkable for worsening kidney function.  Nephrology following, suspected to have ATN given septic shock.  PT/OT recommending SNF/long-term care.  TOC following.  Palliative care was also consulted for goals of care.  Remains full code.   5/8: DVT study positive 5/11: Febrile, repeat cultures sent, intermittent diarrhea noted.  Resume vancomycin pending cultures.  Initiate Xarelto 5/12: Blood culture with Pseudomonas-vancomycin/ceftriaxone changed to cefepime.  Hemoglobin decreased to 6.5, without overt bleeding. 1 unit of PRBC.   5/13-14: ID consulted for bacteremia with Pseudomonas aeruginosa.  5/15: Remains on meropenem for Pseudomonas bacteremia 5/16: Unfortunately admissions coordinator at [facility] health care notified us that the bed offer was rescinded.  Multiple reasons given 5/17: Notified by TOC that [facility] is also declined placement.  Patient was reluctant to leave the area 5/20: Patient did have bed offers in [place] in [place].  He states these are too far from his house.  Wishes to go home at time of discharge. 5/21: ALL home health agencies have declined.  Patient will remain in the hospital through 5/25 to complete his antibiotic course, then  discharge home in the care of his mother.   Assessment & Plan:   Principal Problem:   Severe hypothyroidism Active Problems:   Malnutrition of moderate degree (HCC)   Generalized weakness   Ulcer of right ankle (HCC)   Hypotension due to hypovolemia   Acute renal failure due to tubular necrosis (HCC)   Hypothyroidism   Multiple wounds   Bacteremia due to Pseudomonas   Lymphedema associated with obesity  Septic shock secondary to staph bacteremia with repeat culture showing Pseudomonas, POA:  Shock physiology has long resolved.  Patient was previously on midodrine and stress dose steroids for blood pressure support.  These have been weaned off.  Initial blood cultures positive for Staphylococcus capitis.  Subsequently patient developed recurrent fevers.  Repeat blood culture positive for Pseudomonas.  Source appears to be multiple skin infections.  Cultured by ID. Plan: Continue meropenem with pharmacy dosing assistance.  Infectious disease following.  Monitor vitals and fever curve.  Antibiotic end date 5/25.  Will plan to keep patient hospitalized until 5/25 and discharged home after.   Left lower extremity DVT.   -Noted on ultrasound 5/8  -Started Xarelto 5/11  - has an ivc filter -No respiratory symptoms, no indication for VQ scan, given patient's bedbound status he remains remarkably high risk for recurrent DVT.   AKI:  Likely ATN in the setting of septic shock, improved Bicarb drip discontinued 5/17 Ultrasound unremarkable, urine output remains appropriate Nephrology consulted, no indication for dialysis Kidney function improving Renal indices continue to improve as of 5/20 No further recommendations from nephrology.  Following peripherally   Chronic combined systolic /diastolic CHF:  Echo done on 4/24 showed EF of 35 to 40%, global hypokinesis, grade 1 diastolic dysfunction.  Appears euvolemic   Hypokalemia: Improved.  Continue to monitor and replete as needed.    Severe hypothyroidism:  Completed IV Synthroid dosing, continue p.o. Synthroid.   Recommend follow-up thyroid function test in 4 to 6 weeks.   Leukocytosis/thrombocytosis: Resolved, questionably reactive given above.   Anemia of chronic disease:  Status post 3 unit PRBC this hospitalization Low threshold for imaging given newly prescribed Xarelto   Chronic sinus tachycardia:  Chronic problem.  QTc also prolonged.  Avoid QTc prolonging medications.  On low-dose metoprolol   Depression: Continue Remeron   Chronic abdomen pain/diarrhea :He complains of chronic abdominal pain and fear of eating as it precipitates diarrhea.  Continue Creon - appetite/diarrhea and bloating sensations appear to be improving.  Patient tolerating p.o. intake   Morbid obesity/bedbound status: PT/OT recommending Long-term facility.  TOC following.  Unfortunately bed offer at [facility] care was rescinded for reasons that are unclear.  [facility] a bed.  Plan: No acceptable options were found.  Patient will discharge home.  Unfortunately home health is not an option.  Patient and mother are aware.   Goals of care: Morbidly obese patient with multiple medical problems.  History of bariatric surgery, OSA, obesity hypoventilation syndrome, chronic lymphedema, status post IVC filter placement. bedbound status.  Palliative care was following for goals of care, remains full code.  Very high risk for hospital readmission.  DVT prophylaxis: Xarelto Code Status: Full Family Communication: no answer when mother called today but patient says he has been in touch with her, she will be home to greet him tomorrow Disposition Plan: Status is: Inpatient Remains inpatient appropriate because: Pseudomonas bacteremia on IV antibiotics   Level of care: Med-Surg  Consultants:  Nephrology ID  Procedures:  None  Antimicrobials: Meropenem   Subjective: Seen and  examined.  Feels well this morning.  Tolerating p.o. intake.  No pain.  Objective: Vitals:   05/21/23 2053 05/22/23 0523 05/22/23 0805 05/22/23 0808  BP: 107/77 107/86 106/75 106/75  Pulse: (!) 108 100 98 94  Resp: 16 16 18    Temp: 98.4 F (36.9 C) 97.9 F (36.6 C) 97.8 F (36.6 C) 97.8 F (36.6 C)  TempSrc: Oral Oral Oral Oral  SpO2: 98% 100% 100% 100%  Weight:  (!) 181.6 kg    Height:        Intake/Output Summary (Last 24 hours) at 05/22/2023 1256 Last data filed at 05/22/2023 0500 Gross per 24 hour  Intake --  Output 800 ml  Net -800 ml   Filed Weights   05/20/23 0624 05/21/23 0500 05/22/23 0523  Weight: (!) 176.3 kg (!) 178.6 kg (!) 181.6 kg    Examination:  General exam: NAD.  Appears frail and chronically ill Respiratory system: Poor respiratory effort.  Normal work of breathing.  Room air Cardiovascular system: S1-S2, RRR, no murmurs, marked lymphedema bilateral lower extremities Gastrointestinal system: Obese, soft, NT/ND,+ bowel sounds Central nervous system: Alert and oriented. No focal neurological deficits. Extremities: Markedly decreased power bilateral upper and lower extremities Skin: Multiple areas of skin lesions and breakdown in various levels of healing Psychiatry: Judgement and insight appear normal. Mood & affect appropriate.     Data Reviewed: I have personally reviewed following labs and imaging studies  CBC: Recent Labs  Lab 05/18/23 0704 05/22/23 0451  WBC 7.3 10.5  NEUTROABS 3.9  --   HGB 8.9* 8.3*  HCT 27.8* 26.0*  MCV 88.0 88.7  PLT 526* 565*  Basic Metabolic Panel: Recent Labs  Lab 05/18/23 0704 05/19/23 0349 05/21/23 0436  NA 136  --  137  K 4.1  --  3.9  CL 105  --  107  CO2 21*  --  20*  GLUCOSE 79  --  76  BUN 54*  --  45*  CREATININE 2.13* 2.12* 1.76*  CALCIUM 8.4*  --  8.4*  MG 1.8  --   --   PHOS 3.4  --   --    GFR: Estimated Creatinine Clearance: 82.1 mL/min (A) (by C-G formula based on SCr of 1.76 mg/dL  (H)). Liver Function Tests: Recent Labs  Lab 05/18/23 0704  ALBUMIN 2.1*   No results for input(s): "LIPASE", "AMYLASE" in the last 168 hours. No results for input(s): "AMMONIA" in the last 168 hours. Coagulation Profile: No results for input(s): "INR", "PROTIME" in the last 168 hours. Cardiac Enzymes: No results for input(s): "CKTOTAL", "CKMB", "CKMBINDEX", "TROPONINI" in the last 168 hours. BNP (last 3 results) No results for input(s): "PROBNP" in the last 8760 hours. HbA1C: No results for input(s): "HGBA1C" in the last 72 hours. CBG: No results for input(s): "GLUCAP" in the last 168 hours.  Lipid Profile: No results for input(s): "CHOL", "HDL", "LDLCALC", "TRIG", "CHOLHDL", "LDLDIRECT" in the last 72 hours. Thyroid Function Tests: No results for input(s): "TSH", "T4TOTAL", "FREET4", "T3FREE", "THYROIDAB" in the last 72 hours. Anemia Panel: No results for input(s): "VITAMINB12", "FOLATE", "FERRITIN", "TIBC", "IRON", "RETICCTPCT" in the last 72 hours. Sepsis Labs: No results for input(s): "PROCALCITON", "LATICACIDVEN" in the last 168 hours.   No results found for this or any previous visit (from the past 240 hour(s)).        Radiology Studies: No results found.      Scheduled Meds:  vitamin C  500 mg Oral BID   copper  8 mg Oral Daily   feeding supplement  1 Container Oral TID BM   folic acid  1 mg Oral Daily   hydrocerin   Topical BID   lactase  6,000 Units Oral TID WC   levothyroxine  100 mcg Oral Q0600   lipase/protease/amylase  24,000 Units Oral TID WC   metoprolol succinate  12.5 mg Oral Daily   mirtazapine  15 mg Oral QHS   multivitamin with minerals  1 tablet Oral BID   pantoprazole  40 mg Oral QHS   Rivaroxaban  15 mg Oral BID WC   Followed by   Melene Muller ON 05/30/2023] rivaroxaban  20 mg Oral Q supper   saccharomyces boulardii  250 mg Oral BID   sodium bicarbonate  650 mg Oral BID   thiamine  100 mg Oral Daily   vitamin A  10,000 Units Oral Daily    Vitamin D (Ergocalciferol)  50,000 Units Oral Q7 days   Vitamin E  400 Units Oral Daily   Continuous Infusions:  meropenem (MERREM) IV 1 g (05/22/23 1610)     LOS: 31 days     Silvano Bilis, MD [facility]   If 7PM-7AM, please contact night-coverage  05/22/2023, 12:56 PM

## 2023-05-22 NOTE — Progress Notes (Signed)
Pt refused 0500 dressing change, currently resting in bed, respirations even and unlabored. Plan of care ongoing.

## 2023-05-22 NOTE — TOC Progression Note (Signed)
Transition of Care [facility]) - Progression Note    Patient Details  Name: Christopher Herman MRN: [id number] Date of Birth: 1976/07/27  Transition of Care [facility]) CM/SW Contact  Liliana Cline, LCSW Phone Number: 05/22/2023, 10:35 AM  Clinical Narrative:    Call to [facility] per MD request to see if they can prearrange EMS transport home for patient tomorrow 5/25. Per Vonna Kotyk at [facility], they cannot prearrange transport. Vonna Kotyk stated it will not be an issue to take patient home to [place] address listed in chart tomorrow afternoon, it would just depend on truck availability for the pick up time.   Expected Discharge Plan: Home/Self Care Barriers to Discharge: Continued Medical Work up  Expected Discharge Plan and Services     Post Acute Care Choice: Skilled Nursing Facility Living arrangements for the past 2 months: Single Family Home                                       Social Determinants of Health (SDOH) Interventions SDOH Screenings   Food Insecurity: No Food Insecurity (11/04/2022)  Housing: Low Risk  (11/04/2022)  Transportation Needs: No Transportation Needs (11/04/2022)  Alcohol Screen: Low Risk  (09/13/2018)  Depression (PHQ2-9): Low Risk  (11/04/2022)  Physical Activity: Inactive (11/04/2022)  Tobacco Use: Medium Risk (04/21/2023)    Readmission Risk Interventions     No data to display

## 2023-05-23 DIAGNOSIS — E039 Hypothyroidism, unspecified: Secondary | ICD-10-CM | POA: Diagnosis not present

## 2023-05-23 LAB — CREATININE, SERUM
Creatinine, Ser: 1.69 mg/dL — ABNORMAL HIGH (ref 0.61–1.24)
GFR, Estimated: 50 mL/min — ABNORMAL LOW

## 2023-05-23 MED ORDER — RIVAROXABAN 15 MG PO TABS: 15.0000 mg | ORAL_TABLET | Freq: Two times a day (BID) | ORAL | 0 refills | Status: AC

## 2023-05-23 MED ORDER — SILVER SULFADIAZINE 1 % EX CREA: TOPICAL_CREAM | CUTANEOUS | 1 refills | Status: AC

## 2023-05-23 MED ORDER — METOPROLOL SUCCINATE ER 25 MG PO TB24: 12.5000 mg | ORAL_TABLET | Freq: Every day | ORAL | 1 refills | Status: AC

## 2023-05-23 MED ORDER — LEVOTHYROXINE SODIUM 100 MCG PO TABS: 100.0000 ug | ORAL_TABLET | Freq: Every day | ORAL | 1 refills | Status: AC

## 2023-05-23 MED ORDER — POLYETHYLENE GLYCOL 3350 17 G PO PACK: 17.0000 g | PACK | Freq: Every day | ORAL | 0 refills | Status: AC | PRN

## 2023-05-23 MED ORDER — MIRTAZAPINE 15 MG PO TABS: 15.0000 mg | ORAL_TABLET | Freq: Every day | ORAL | 1 refills | Status: AC

## 2023-05-23 MED ORDER — RIVAROXABAN 20 MG PO TABS: 20.0000 mg | ORAL_TABLET | Freq: Every day | ORAL | 1 refills | Status: AC

## 2023-05-23 MED ORDER — MULTI-VITAMIN/MINERALS PO TABS: 1.0000 | ORAL_TABLET | Freq: Every day | ORAL | 3 refills | Status: AC

## 2023-05-23 NOTE — Discharge Summary (Signed)
Christopher Herman:[id number] DOB: [birth year] DOA: 04/21/2023  PCP: Pcp, No  Admit date: 04/21/2023 Discharge date: 05/23/2023  Time spent: 40 minutes  Recommendations for Outpatient Follow-up:  Pcp f/u (list of pcps given to patient as he is considering switching practices) Will need tsh in 1 month, also check of kidney function    Discharge Diagnoses:  Principal Problem:   Severe hypothyroidism Active Problems:   Malnutrition of moderate degree (HCC)   Generalized weakness   Ulcer of right ankle (HCC)   Hypotension due to hypovolemia   Acute renal failure due to tubular necrosis (HCC)   Hypothyroidism   Multiple wounds   Bacteremia due to Pseudomonas   Lymphedema associated with obesity   Discharge Condition: improved  Diet recommendation: heart healthy  Filed Weights   05/21/23 0500 05/22/23 0523 05/23/23 0400  Weight: (!) 178.6 kg (!) 181.6 kg (!) 181 kg    History of present illness:  From admission h and p to the ICU  on 04/21/23 47 y.o male with significant PMH of severe obesity s/p bariatric surgery, OSA, hypoventilation syndrome, HTN, GERD, cholelithiasis, bilateral extremity lipoma, chronic lymphedema status post IVC filter placement, IDA, who presented to the ED with chief complaints of generalized weakness, extreme fatigue, unintended weight loss, disuse of upper extremities and poor p.o. intake times few days   Patient given 30 cc/kg of fluids and started on broad-spectrum antibiotics Vanco cefepime and Flagyl for suspected sepsis of unknown origin with septic shock. Patient remained hypotensive despite IVF boluses therefore was started on Levophed.  (Sepsis reassessment completed). PCCM consulted.  Hospital Course:   Septic shock secondary to staph bacteremia with repeat culture showing Pseudomonas, POA:  Shock physiology has long resolved.  Patient was previously on midodrine and stress dose steroids for blood pressure support.  These have been weaned off.   Initial blood cultures positive for Staphylococcus capitis.  Subsequently patient developed recurrent fevers.  Repeat blood culture positive for Pseudomonas.  Source appears to be multiple skin infections.  Cultured by ID. Plan: Completed course meropenen on 5/25   Left lower extremity DVT.   -Noted on ultrasound 5/8  -Started Xarelto 5/11  - has an ivc filter -No respiratory symptoms, no indication for VQ scan, given patient's bedbound status he remains remarkably high risk for recurrent DVT.   AKI:  Likely ATN in the setting of septic shock, improved Bicarb drip discontinued 5/17 Ultrasound unremarkable, urine output remains appropriate Nephrology consulted, no indication for dialysis Kidney function improving, most recent 1.69   Chronic combined systolic /diastolic CHF:  Echo done on 4/24 showed EF of 35 to 40%, global hypokinesis, grade 1 diastolic dysfunction.   Appears euvolemic   Severe hypothyroidism:  Completed IV Synthroid dosing, continue p.o. Synthroid.   Recommend follow-up thyroid function test in 4 to 6 weeks.   Anemia of chronic disease:  Status post 3 unit PRBC this hospitalization, now stable   Depression: Continue Remeron   Morbid obesity/bedbound status: PT/OT recommending Long-term facility.  TOC following.  Unfortunately bed offer at [facility] was rescinded for reasons that are unclear.  [facility]  Plan: No acceptable options were found.  Patient will discharge home.  Unfortunately home health is not an option.  Patient and mother are aware.   Goals of care: Morbidly obese patient with multiple medical problems.  History of bariatric surgery, OSA, obesity hypoventilation syndrome, chronic lymphedema, status post IVC filter placement. bedbound status.  Pallative care was following for goals of care, remains full code.  Very high risk for hospital readmission.  Procedures: none    Consultations: Nephrology, pccm  Discharge Exam: Vitals:   05/23/23 0455 05/23/23 0917  BP: 114/89 113/81  Pulse: (!) 105 (!) 107  Resp: 18 18  Temp: 97.8 F (36.6 C) 97.8 F (36.6 C)  SpO2: 100% 98%    General exam: NAD.  Appears frail and chronically ill Respiratory system: Poor respiratory effort.  Normal work of breathing.  Room air Cardiovascular system: S1-S2, RRR, no murmurs, marked lymphedema bilateral lower extremities Gastrointestinal system: Obese, soft, NT/ND,+ bowel sounds Central nervous system: Alert and oriented. No focal neurological deficits. Extremities: Markedly decreased power bilateral upper and lower extremities Skin: Multiple areas of skin lesions and breakdown in various levels of healing Psychiatry: Judgement and insight appear normal. Mood & affect appropriate.   Discharge Instructions   Discharge Instructions     Diet - low sodium heart healthy   Complete by: As directed    Discharge wound care:   Complete by: As directed    Apply silvadine, keep clean, dress daily   Increase activity slowly   Complete by: As directed       Allergies as of 05/23/2023   No Known Allergies      Medication List     STOP taking these medications    cyclobenzaprine 5 MG tablet Commonly known as: FLEXERIL   doxycycline 100 MG tablet Commonly known as: VIBRA-TABS   furosemide 40 MG tablet Commonly known as: LASIX   simethicone 80 MG chewable tablet Commonly known as: Gas-X   Vitamin D3 50 MCG (2000 UT) capsule   Wegovy 0.25 MG/0.5ML Soaj Generic drug: Semaglutide-Weight Management       TAKE these medications    levothyroxine 100 MCG tablet Commonly known as: SYNTHROID Take 1 tablet (100 mcg total) by mouth daily at 6 (six) AM. Start taking on: May 24, 2023   metoprolol succinate 25 MG 24 hr tablet Commonly known as: TOPROL-XL Take 0.5 tablets (12.5 mg total) by mouth daily. Start taking on: May 24, 2023   mirtazapine 15 MG  tablet Commonly known as: REMERON Take 1 tablet (15 mg total) by mouth at bedtime.   multivitamin with minerals tablet Take 1 tablet by mouth daily.   polyethylene glycol 17 g packet Commonly known as: MIRALAX / GLYCOLAX Take 17 g by mouth daily as needed for moderate constipation.   Rivaroxaban 15 MG Tabs tablet Commonly known as: XARELTO Take 1 tablet (15 mg total) by mouth 2 (two) times daily with a meal for 6 days.   rivaroxaban 20 MG Tabs tablet Commonly known as: XARELTO Take 1 tablet (20 mg total) by mouth daily with supper. Start taking on: May 30, 2023   SSD 1 % cream Generic drug: silver sulfADIAZINE APPLY TOPICALLY TO THE AFFECTED AREA DAILY What changed: Another medication with the same name was added. Make sure you understand how and when to take each.   silver sulfADIAZINE 1 % cream Commonly known as: SILVADENE Apply to affected area daily What changed: You were already taking a medication with the same name, and this prescription was added. Make sure you understand how and when to take each.               Durable Medical Equipment  (From admission, onward)           Start     Ordered   05/21/23 [phone]  For home use only DME Other see comment  Once       Comments: Michiel Sites lift Bariatric Patient declined hospital bed  Question:  Length of Need  Answer:  Lifetime   05/21/23 0923   05/21/23 0920  For home use only DME standard manual wheelchair with seat cushion  Once       Comments: Patient suffers from Morbid obesity, CHF which impairs their ability to perform daily activities like grooming in the home.  A walker will not resolve issue with performing activities of daily living. A wheelchair will allow patient to safely perform daily activities. Patient can safely propel the wheelchair in the home or has a caregiver who can provide assistance. Length of need Lifetime. Accessories: elevating leg rests (ELRs), wheel locks, extensions and  anti-tippers.  Bariatric   05/21/23 0922              Discharge Care Instructions  (From admission, onward)           Start     Ordered   05/23/23 0000  Discharge wound care:       Comments: Apply silvadine, keep clean, dress daily   05/23/23 1038           No Known Allergies    The results of significant diagnostics from this hospitalization (including imaging, microbiology, ancillary and laboratory) are listed below for reference.    Significant Diagnostic Studies: DG Hand 2 View Right  Result Date: 05/15/2023 CLINICAL DATA:  Pain and swelling of the bilateral hands. EXAM: RIGHT HAND - 2 VIEW COMPARISON:  None Available. FINDINGS: There is mild-to-moderate medial downsloping of the distal radius, symmetric with the contralateral hand imaged the same day and likely congenital. 5 mm ulnar negative variance. There is possible subluxation of the base of the thumb metacarpal with respect of the trapezium given increased lateralization and angulation on the provided oblique view, however the joint appears normally aligned on frontal view. Note is made there is a greater appearance of possible subluxation versus dislocation of the contralateral LEFT thumb carpometacarpal joint imaged the same day. No acute fracture. IMPRESSION: 1. Mild-to-moderate medial downsloping of the distal radius, symmetric with the contralateral hand imaged the same day and likely congenital. 2. There is 5 mm ulnar negative variance. 3. On oblique view there appears to be exaggerated angulation and lateralization of the thumb metacarpal with respect to the trapezium, however the joint appears normal on frontal view. No definite subluxation. Note is made there are findings more suggestive of at least subluxation of the contralateral LEFT thumb carpometacarpal joint on same-day radiographs. Electronically Signed   By: Neita Garnet M.D.   On: 05/15/2023 11:08   DG Hand 2 View Left  Result Date:  05/15/2023 CLINICAL DATA:  Bilateral hand pain and swelling. EXAM: LEFT HAND - 2 VIEW COMPARISON:  None Available. FINDINGS: There is decreased bone mineralization, greatest around the joint spaces. There is mild-to-moderate medial downsloping of the distal radius, symmetric with the contralateral side imaged the same day and likely congenital. 5 mm ulnar negative variance. Mild distal radioulnar joint space narrowing and peripheral osteophytosis. There is more lateral and, dorsal, and proximal positioning of the base of the thumb metacarpal than expected compared to the trapezium, and there appears to be a high-grade subluxation versus dislocation of the thumb carpometacarpal joint, best seen on lateral view. There is similar but less severe angulation of the right thumb carpometacarpal joint, imaged the same day. Mild-to-moderate triscaphe and second carpometacarpal  joint space narrowing and subchondral cystic change. Mild interphalangeal joint space narrowing diffusely. No acute fracture is seen. IMPRESSION: 1. There appears to be a high-grade subluxation versus dislocation of the thumb carpometacarpal joint, best seen on lateral view. This is similar to but more severe than angulation of the contralateral right thumb carpometacarpal joint on same-day radiographs, and therefore may represent some congenital angulation of this joint. However, recommend clinical correlation for possible dislocation. 2. Mild-to-moderate triscaphe and second carpometacarpal joint space narrowing and subchondral cystic change. Electronically Signed   By: Neita Garnet M.D.   On: 05/15/2023 11:05   [place] EKG SITE RITE  Result Date: 05/08/2023 If Site Rite image not attached, placement could not be confirmed due to current cardiac rhythm.  US Venous Img Lower Bilateral (DVT)  Result Date: 05/06/2023 CLINICAL DATA:  Bilateral lower extremity edema. EXAM: BILATERAL LOWER EXTREMITY VENOUS DOPPLER ULTRASOUND TECHNIQUE: Gray-scale  sonography with graded compression, as well as color Doppler and duplex ultrasound were performed to evaluate the lower extremity deep venous systems from the level of the common femoral vein and including the common femoral, femoral, profunda femoral, popliteal and calf veins including the posterior tibial, peroneal and gastrocnemius veins when visible. The superficial great saphenous vein was also interrogated. Spectral Doppler was utilized to evaluate flow at rest and with distal augmentation maneuvers in the common femoral, femoral and popliteal veins. COMPARISON:  None Available. FINDINGS: RIGHT LOWER EXTREMITY Common Femoral Vein: No evidence of thrombus. Normal compressibility, respiratory phasicity and response to augmentation. Saphenofemoral Junction: No evidence of thrombus. Normal compressibility and flow on color Doppler imaging. Profunda Femoral Vein: No evidence of thrombus. Normal compressibility and flow on color Doppler imaging. Femoral Vein: No evidence of thrombus. Normal compressibility, respiratory phasicity and response to augmentation. Popliteal Vein: No evidence of thrombus. Normal compressibility, respiratory phasicity and response to augmentation. Calf Veins: No evidence of thrombus. Normal compressibility and flow on color Doppler imaging. Superficial Great Saphenous Vein: No evidence of thrombus. Normal compressibility. Venous Reflux:  None. Other Findings: No evidence of superficial thrombophlebitis or abnormal fluid collection. LEFT LOWER EXTREMITY Common Femoral Vein: Occlusive thrombus identified. Saphenofemoral Junction: Thrombus is present at the level of the saphenofemoral junction. Profunda Femoral Vein: Thrombus present which appears nonocclusive with some flow demonstrated. Femoral Vein: Occlusive thrombus identified in the femoral vein. Popliteal Vein: Occlusive thrombus identified in the popliteal vein. Calf Veins: Posterior tibial vein appears to demonstrate some thrombus but  is poorly visualized. The peroneal vein was not visualized. Superficial Great Saphenous Vein: No evidence of thrombus. Normal compressibility. Venous Reflux:  None. Other Findings: No evidence of superficial thrombophlebitis or abnormal fluid collection. IMPRESSION: 1. No evidence of right lower extremity deep venous thrombosis. 2. Extensive occlusive and nonocclusive thrombus in the left lower extremity from the common femoral vein through the popliteal vein and likely involving the posterior tibial vein. Electronically Signed   By: Irish Lack M.D.   On: 05/06/2023 17:02   US RENAL  Result Date: 04/24/2023 CLINICAL DATA:  Oliguria EXAM: RENAL / URINARY TRACT ULTRASOUND COMPLETE COMPARISON:  02/11/2022 abdomen ultrasound FINDINGS: Right Kidney: Renal measurements: 9.8 x 3.7 x 4.2 cm = volume: 80 mL. Echogenicity within normal limits. No mass or hydronephrosis visualized. In the inferior pole, there is a shadowing calculus that measures up to 7 mm. Left Kidney: Renal measurements: 9.6 x 4.8 x 4.9 cm = volume: 120 mL. Echogenicity within normal limits. No mass or hydronephrosis visualized. Multiple shadowing calculi are noted, measuring up to  9 mm in the mid left kidney, 7 mm in the mid to inferior left kidney, and 10 mm in the inferior left kidney. Bladder: Foley is noted within the bladder, which is decompressed. Other: None. IMPRESSION: 1. No hydronephrosis. 2. Bilateral nonobstructing renal calculi. Electronically Signed   By: Wiliam Ke M.D.   On: 04/24/2023 11:55    Microbiology: No results found for this or any previous visit (from the past 240 hour(s)).   Labs: Basic Metabolic Panel: Recent Labs  Lab 05/18/23 0704 05/19/23 0349 05/21/23 0436 05/23/23 0421  NA 136  --  137  --   K 4.1  --  3.9  --   CL 105  --  107  --   CO2 21*  --  20*  --   GLUCOSE 79  --  76  --   BUN 54*  --  45*  --   CREATININE 2.13* 2.12* 1.76* 1.69*  CALCIUM 8.4*  --  8.4*  --   MG 1.8  --   --   --    PHOS 3.4  --   --   --    Liver Function Tests: Recent Labs  Lab 05/18/23 0704  ALBUMIN 2.1*   No results for input(s): "LIPASE", "AMYLASE" in the last 168 hours. No results for input(s): "AMMONIA" in the last 168 hours. CBC: Recent Labs  Lab 05/18/23 0704 05/22/23 0451  WBC 7.3 10.5  NEUTROABS 3.9  --   HGB 8.9* 8.3*  HCT 27.8* 26.0*  MCV 88.0 88.7  PLT 526* 565*   Cardiac Enzymes: No results for input(s): "CKTOTAL", "CKMB", "CKMBINDEX", "TROPONINI" in the last 168 hours. BNP: BNP (last 3 results) No results for input(s): "BNP" in the last 8760 hours.  ProBNP (last 3 results) No results for input(s): "PROBNP" in the last 8760 hours.  CBG: No results for input(s): "GLUCAP" in the last 168 hours.     Signed:  Silvano Bilis MD.  [facility] 05/23/2023, 10:40 AM

## 2023-05-23 NOTE — TOC Transition Note (Addendum)
Transition of Care [facility]) - CM/SW Discharge Note   Patient Details  Name: Christopher Herman MRN: [id number] Date of Birth: 06-25-1976  [facility] of [facility]) CM/SW Contact:  Liliana Cline, LCSW Phone Number: 05/23/2023, 9:13 AM   Clinical Narrative:    Plan for patient to DC home today after he finishes his IV medication. EMS paperwork has been completed. PCP resource list also provided.  Will call for [facility] this afternoon when notified that patient is ready for transport.   3:15- [facility] called for transport. Patient is next on the list. RN notified.    Final next level of care: Home/Self Care Barriers to Discharge: Barriers Resolved   Patient Goals and CMS Choice      Discharge Placement                         Discharge Plan and Services Additional resources added to the After Visit Summary for       Post Acute Care Choice: Skilled Nursing Facility                               Social Determinants of Health (SDOH) Interventions SDOH Screenings   Food Insecurity: No Food Insecurity (11/04/2022)  Housing: Low Risk  (11/04/2022)  Transportation Needs: No Transportation Needs (11/04/2022)  Alcohol Screen: Low Risk  (09/13/2018)  Depression (PHQ2-9): Low Risk  (11/04/2022)  Physical Activity: Inactive (11/04/2022)  Tobacco Use: Medium Risk (04/21/2023)     Readmission Risk Interventions     No data to display

## 2023-05-26 ENCOUNTER — Telehealth: Payer: Self-pay | Admitting: *Deleted

## 2023-05-26 NOTE — Transitions of Care (Post Inpatient/ED Visit) (Signed)
   05/26/2023  Name: ACY OBARA MRN: [id number] DOB: 1976/04/14  Today's TOC FU Call Status: Today's TOC FU Call Status:: Unsuccessul Call (1st Attempt) Unsuccessful Call (1st Attempt) Date: 05/26/23  Attempted to reach the patient regarding the most recent Inpatient/ED visit.  Follow Up Plan: Additional outreach attempts will be made to reach the patient to complete the Transitions of Care (Post Inpatient/ED visit) call.   Gean Maidens BSN RN [facility] [phone]

## 2023-05-27 ENCOUNTER — Telehealth: Payer: Self-pay | Admitting: *Deleted

## 2023-05-27 NOTE — Transitions of Care (Post Inpatient/ED Visit) (Signed)
   05/27/2023  Name: Christopher Herman MRN: [id number] DOB: 1976/07/15  Today's TOC FU Call Status: Today's TOC FU Call Status:: Unsuccessful Call (2nd Attempt) Unsuccessful Call (2nd Attempt) Date: 05/27/23  Attempted to reach the patient regarding the most recent Inpatient/ED visit.  Follow Up Plan: Additional outreach attempts will be made to reach the patient to complete the Transitions of Care (Post Inpatient/ED visit) call.   Gean Maidens BSN RN [facility] [phone]

## 2023-05-28 ENCOUNTER — Telehealth: Payer: Self-pay | Admitting: *Deleted

## 2023-05-28 NOTE — Transitions of Care (Post Inpatient/ED Visit) (Signed)
   05/28/2023  Name: Christopher Herman MRN: [id number] DOB: 21-Mar-1976  Today's TOC FU Call Status: Today's TOC FU Call Status:: Successful TOC FU Call Competed TOC FU Call Complete Date: 05/28/23  Transition Care Management Follow-up Telephone Call Date of Discharge: 05/23/23 Discharge Facility: [facility]) Type of Discharge: Inpatient Admission Primary Inpatient Discharge Diagnosis:: severe hypothyroidism How have you been since you were released from the hospital?: Better Any questions or concerns?: Yes Patient Questions/Concerns:: I donn't have a PCP. RN gave the patient the information that hospitalist given. Follow up with [facility] (Primary Care); this is an option if you are unable to find a PCP. You will need a check of your thyroid function in 1 month. RN discussed Neurosurgeon service for list of PCP Patient Questions/Concerns Addressed: Other:  Items Reviewed: Did you receive and understand the discharge instructions provided?: Yes Medications obtained,verified, and reconciled?: Yes (Medications Reviewed) Any new allergies since your discharge?: No Dietary orders reviewed?: No Do you have support at home?: Yes People in Home: alone Name of Support/Comfort Primary Source: Gwen  Medications Reviewed Today: Medications Reviewed Today     Reviewed by Louanna Raw, CPhT (Pharmacy Technician) on 04/21/23 at 0335  Med List Status: Complete   Medication Order Taking? Sig Documenting Provider Last Dose Status Informant  Cholecalciferol (VITAMIN D3) 50 MCG (2000 UT) capsule [id number] No Take 1 capsule (2,000 Units total) by mouth daily.  Patient not taking: Reported on 10/15/2022   Malva Limes, MD Not Taking Active Self  cyclobenzaprine (FLEXERIL) 5 MG tablet [id number] No Take 1 tablet (5 mg total) by mouth 3 (three) times daily as needed for muscle spasms.  Patient not taking: Reported on 10/15/2022   Jacky Kindle, FNP Not Taking Active Self  doxycycline (VIBRA-TABS) 100 MG tablet [id number] No TAKE 1 TABLET(100 MG) BY MOUTH TWICE DAILY FOR 10 DAYS  Patient not taking: Reported on 04/21/2023   Malva Limes, MD Not Taking Active Self  furosemide (LASIX) 40 MG tablet [id number] No Take 1 tablet (40 mg total) by mouth daily as needed. Take one tablet daily  Patient not taking: Reported on 04/21/2023   Malva Limes, MD Not Taking Active Self  Multiple Vitamins-Minerals (MULTIVITAMIN WITH MINERALS) tablet [id number] No Take 1 tablet by mouth daily.  Patient not taking: Reported on 04/21/2023   Jacky Kindle, FNP Not Taking Active Self  Semaglutide-Weight Management [facility]) 0.25 MG/0.5ML Ivory Broad [id number] No Inject 0.25 mg into the skin once a week. Increase to 0.5 mg once a week starting with 5th dose  Patient not taking: Reported on 10/15/2022   Malva Limes, MD Not Taking Active Self  silver sulfADIAZINE (SILVADENE) 1 % cream [id number] No APPLY TOPICALLY TO THE AFFECTED AREA DAILY  Patient not taking: Reported on 04/21/2023   Malva Limes, MD Not Taking Active Self  simethicone (GAS-X) 80 MG chewable tablet [id number] No Chew 1 tablet (80 mg total) by mouth 4 (four) times daily.  Patient not taking: Reported on 04/21/2023   Jacky Kindle, FNP Not Taking Active Self            Per patient I just want to go back to bed and rest my hands are hurting. He was not wanting to talk anymore.   Patient does not have a PCP.       Gean Maidens BSN RN [facility] [phone]

## 2023-06-08 ENCOUNTER — Telehealth: Payer: Self-pay | Admitting: Obstetrics and Gynecology

## 2023-06-08 NOTE — Telephone Encounter (Signed)
Opened in error

## 2023-06-29 ENCOUNTER — Encounter: Payer: Self-pay | Admitting: *Deleted

## 2023-07-01 NOTE — Progress Notes (Signed)
This encounter was created in error - please disregard.

## 2023-07-08 ENCOUNTER — Encounter: Payer: Self-pay | Admitting: *Deleted

## 2023-07-08 NOTE — Telephone Encounter (Signed)
This encounter was created in error - please disregard.

## 2023-07-30 ENCOUNTER — Other Ambulatory Visit: Payer: Self-pay | Admitting: Obstetrics and Gynecology

## 2023-08-08 ENCOUNTER — Other Ambulatory Visit: Payer: Self-pay | Admitting: Obstetrics and Gynecology
